# Patient Record
Sex: Female | Born: 1975 | Race: Black or African American | Hispanic: No | Marital: Single | Smoking: Never smoker
Health system: Southern US, Community
[De-identification: ages and names within clinical notes are randomized; demographics above are authoritative.]

## PROBLEM LIST (undated history)

## (undated) DIAGNOSIS — M797 Fibromyalgia: Secondary | ICD-10-CM

## (undated) DIAGNOSIS — I5021 Acute systolic (congestive) heart failure: Secondary | ICD-10-CM

## (undated) DIAGNOSIS — F329 Major depressive disorder, single episode, unspecified: Secondary | ICD-10-CM

## (undated) DIAGNOSIS — J349 Unspecified disorder of nose and nasal sinuses: Secondary | ICD-10-CM

## (undated) DIAGNOSIS — F32A Depression, unspecified: Secondary | ICD-10-CM

## (undated) DIAGNOSIS — J189 Pneumonia, unspecified organism: Secondary | ICD-10-CM

## (undated) DIAGNOSIS — I10 Essential (primary) hypertension: Secondary | ICD-10-CM

## (undated) DIAGNOSIS — F419 Anxiety disorder, unspecified: Secondary | ICD-10-CM

## (undated) DIAGNOSIS — J45909 Unspecified asthma, uncomplicated: Secondary | ICD-10-CM

## (undated) DIAGNOSIS — Z789 Other specified health status: Secondary | ICD-10-CM

## (undated) HISTORY — DX: Unspecified disorder of nose and nasal sinuses: J34.9

## (undated) HISTORY — DX: Pneumonia, unspecified organism: J18.9

## (undated) HISTORY — PX: NO PAST SURGERIES: SHX2092

---
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
## 1997-11-04 ENCOUNTER — Emergency Department (HOSPITAL_COMMUNITY): Admission: EM | Admit: 1997-11-04 | Discharge: 1997-11-04 | Payer: Self-pay | Admitting: Emergency Medicine

## 1998-05-07 ENCOUNTER — Encounter: Payer: Self-pay | Admitting: Pediatrics

## 1998-05-07 ENCOUNTER — Ambulatory Visit (HOSPITAL_COMMUNITY): Admission: RE | Admit: 1998-05-07 | Discharge: 1998-05-07 | Payer: Self-pay | Admitting: Pediatrics

## 1998-05-19 ENCOUNTER — Ambulatory Visit (HOSPITAL_COMMUNITY): Admission: RE | Admit: 1998-05-19 | Discharge: 1998-05-19 | Payer: Self-pay | Admitting: Pediatrics

## 2001-10-02 ENCOUNTER — Emergency Department (HOSPITAL_COMMUNITY): Admission: EM | Admit: 2001-10-02 | Discharge: 2001-10-03 | Payer: Self-pay | Admitting: Emergency Medicine

## 2002-10-18 ENCOUNTER — Other Ambulatory Visit: Admission: RE | Admit: 2002-10-18 | Discharge: 2002-10-18 | Payer: Self-pay | Admitting: Obstetrics and Gynecology

## 2007-09-11 ENCOUNTER — Ambulatory Visit (HOSPITAL_COMMUNITY): Admission: RE | Admit: 2007-09-11 | Discharge: 2007-09-11 | Payer: Self-pay | Admitting: Surgery

## 2007-09-13 ENCOUNTER — Encounter: Admission: RE | Admit: 2007-09-13 | Discharge: 2007-09-13 | Payer: Self-pay | Admitting: Surgery

## 2007-10-02 ENCOUNTER — Ambulatory Visit (HOSPITAL_COMMUNITY): Admission: RE | Admit: 2007-10-02 | Discharge: 2007-10-02 | Payer: Self-pay | Admitting: Surgery

## 2007-12-12 ENCOUNTER — Encounter: Admission: RE | Admit: 2007-12-12 | Discharge: 2007-12-12 | Payer: Self-pay | Admitting: Surgery

## 2007-12-26 ENCOUNTER — Ambulatory Visit (HOSPITAL_COMMUNITY): Admission: RE | Admit: 2007-12-26 | Discharge: 2007-12-28 | Payer: Self-pay | Admitting: Surgery

## 2007-12-26 HISTORY — PX: LAPAROSCOPIC GASTRIC BANDING: SHX1100

## 2007-12-27 ENCOUNTER — Encounter (INDEPENDENT_AMBULATORY_CARE_PROVIDER_SITE_OTHER): Payer: Self-pay | Admitting: Surgery

## 2007-12-27 ENCOUNTER — Ambulatory Visit: Payer: Self-pay | Admitting: Vascular Surgery

## 2008-01-09 ENCOUNTER — Encounter: Admission: RE | Admit: 2008-01-09 | Discharge: 2008-01-31 | Payer: Self-pay | Admitting: Surgery

## 2009-10-14 ENCOUNTER — Encounter: Admission: RE | Admit: 2009-10-14 | Discharge: 2009-10-14 | Payer: Self-pay | Admitting: Surgery

## 2010-08-11 NOTE — Op Note (Signed)
NAME:  Samantha Romero, Samantha Romero NO.:  [id number]   MEDICAL RECORD NO.:  [id number]          PATIENT TYPE:  OIB   LOCATION:  1227                         FACILITY:  [facility]   PHYSICIAN:  Thornton Park. Daphine Deutscher, MD  DATE OF BIRTH:  1975/08/08   DATE OF PROCEDURE:  12/26/2007  DATE OF DISCHARGE:                               OPERATIVE REPORT   PREOPERATIVE DIAGNOSIS:  Morbid obesity, body mass index of 54.   POSTOPERATIVE DIAGNOSIS:  Morbid obesity, body mass index of 54, no  sliding hiatal hernia noted with the balloon test.   OPERATIVE TIME:  3 hours 45 minutes.   PROCEDURE:  Laparoscopic adjustable gastric banding with an APL device  by Allergan with upper endoscopy.   SURGEON:  Luretha Murphy, M.D.   ASSISTANT:  Baruch Merl, M.D.   ANESTHESIA:  General endotracheal.   DESCRIPTION OF PROCEDURE:  Korin Setzler is a 35 year old African  American lady with morbid obesity, BMI of 54.  Informed consent was  obtained preop regarding the laparoscopic procedure as well as open  procedures, if indicated.  The patient was seen in the holding area and  no further questions were raised and we were to proceed with lap band.  The patient was taken to room 1 and given general anesthesia.  A time-  out was done.  The patient was prepped with Techni-Care and draped  sterilely.  Access to the abdomen was gained through the left upper  quadrant using OptiVu 0 degree scope and the significant finding was  that her pannus was quite thick and because of her short stature, is  very short in the mid section.  Upon entering and insufflating, it was  notable that she had a very small compartment to work through and  tremendous amount of adiposity.  I went ahead and placed a lower trocar  with a little bit of an angle for the angle scope and it did not even  reach into the abdomen.  Eventually I had to use 12 mm long trocars.  I  also had to have incorporate extra trocars because of the location  of  her left lateral segment and the smallness of her diaphragm and thorax.  The Nathanson retractor did not fit and I used AutoSuture blue cloth  wand coming in through a right lateral port.  With that and with two  insufflations, which I eventually added a second one, we still struggled  with being able to really see in the upper abdomen.  First we did pass a  balloon sizer, blew up the balloon and brought it back and it engaged  diaphragm and did not slide and with 15 mL, we felt that she likely did  not have a significant hiatal hernia.  I then I created a spot over on  the left side for subsequent dissection and ended up because of  trajectories having to use the upper port for the band passer.  I passed  that after bringing in the APL band through the 15 port.  Multiple times  we with some difficulty again because of the  trajectories and despite  bending some of our Prestige graspers passed the band into the band  passer and bring it around but it would pop out.  I also went ahead and  endoscoped her in the meantime to make sure everything was okay with the  dissection and then the esophagogastric area appeared to be undisturbed.  I was able to get the APL band around and snapped and then was able to  hold it over and then coming in very anteriorly and very high with an  Endo stitch, I sutured it with four sutures plicating the stomach  anterior.  The four ties were secured with a tie knot.  I irrigated  well.  I brought  this out through the right upper port and created a subcutaneous pocket,  connected it to this and then sutured it with four sutures to the  fascia.  The wounds were all irrigated, closed 4-0 Vicryl and with  Benzoin Steri-Strips.  The patient seemed to tolerate the procedure well  and was taken to the recovery room in satisfactory condition.      Thornton Park Daphine Deutscher, MD  Electronically Signed     MBM/MEDQ  D:  12/26/2007  T:  12/27/2007  Job:  [id number]   cc:    Clydie Braun L. Hal Hope, M.D.  Fax: [phone]

## 2010-10-23 ENCOUNTER — Encounter (INDEPENDENT_AMBULATORY_CARE_PROVIDER_SITE_OTHER): Payer: Self-pay | Admitting: Surgery

## 2010-10-23 ENCOUNTER — Ambulatory Visit (INDEPENDENT_AMBULATORY_CARE_PROVIDER_SITE_OTHER): Payer: BC Managed Care – PPO | Admitting: Surgery

## 2010-10-23 VITALS — BP 148/100 | Wt 256.8 lb

## 2010-10-23 DIAGNOSIS — Z4651 Encounter for fitting and adjustment of gastric lap band: Secondary | ICD-10-CM

## 2010-10-23 DIAGNOSIS — Z9884 Bariatric surgery status: Secondary | ICD-10-CM

## 2010-10-23 NOTE — Progress Notes (Signed)
Ms. Laplante comes in today for a LAP-BAND felt. I accessed her port and added at 0.3 cc to her band.  Recently she had a flare up of her asthma and one on steroids and this altered her weight loss. Today's weight is 256.8. She lost a total of 38.2 overall suture surgery. I went ahead and added 0.3 cc today see her back in 6 weeks

## 2010-12-07 ENCOUNTER — Encounter (INDEPENDENT_AMBULATORY_CARE_PROVIDER_SITE_OTHER): Payer: Self-pay | Admitting: Surgery

## 2010-12-09 ENCOUNTER — Encounter (INDEPENDENT_AMBULATORY_CARE_PROVIDER_SITE_OTHER): Payer: Self-pay | Admitting: Surgery

## 2010-12-28 LAB — COMPREHENSIVE METABOLIC PANEL
AST: 21
Albumin: 3.9
Chloride: 107
Creatinine, Ser: 0.68
GFR calc Af Amer: >60
Potassium: 3.3 — ABNORMAL LOW
Total Bilirubin: 0.7
Total Protein: 6.5

## 2010-12-28 LAB — CBC
MCV: 85.9
MCV: 86.1
Platelets: 215
Platelets: 221
RBC: 3.8 — ABNORMAL LOW
RDW: 13.7
WBC: 5.2
WBC: 8.4

## 2010-12-28 LAB — PREGNANCY, URINE: Preg Test, Ur: NEGATIVE

## 2010-12-28 LAB — DIFFERENTIAL
Lymphocytes Relative: 11 — ABNORMAL LOW
Lymphs Abs: 0.9
Monocytes Relative: 9
Neutro Abs: 6.7
Neutrophils Relative %: 80 — ABNORMAL HIGH

## 2010-12-28 LAB — HEMOGLOBIN AND HEMATOCRIT, BLOOD
HCT: 37.4
Hemoglobin: 12.2

## 2011-02-24 ENCOUNTER — Encounter (INDEPENDENT_AMBULATORY_CARE_PROVIDER_SITE_OTHER): Payer: Self-pay | Admitting: Surgery

## 2011-02-24 ENCOUNTER — Ambulatory Visit (INDEPENDENT_AMBULATORY_CARE_PROVIDER_SITE_OTHER): Payer: BC Managed Care – PPO | Admitting: Surgery

## 2011-02-24 ENCOUNTER — Encounter (INDEPENDENT_AMBULATORY_CARE_PROVIDER_SITE_OTHER): Payer: BC Managed Care – PPO

## 2011-02-24 VITALS — BP 158/102 | HR 100 | Temp 98.5°F | Resp 18 | Ht 61.0 in | Wt 271.0 lb

## 2011-02-24 DIAGNOSIS — Z9884 Bariatric surgery status: Secondary | ICD-10-CM

## 2011-02-24 NOTE — Patient Instructions (Signed)

## 2011-02-24 NOTE — Progress Notes (Signed)
Deonne comes in today and he really says she feels no restriction.  On it and added 3 cc to her APL band. She was able to drink and she wanted to try this. She did feel it was somewhat tighter. I told her that if it's too tight when she comes off here in the morning of 11 tobacco in the office and I would do an significant fluid out of her pain if it if it is too tight. Otherwise she felt like it was fine and she was able to  drink water.

## 2011-07-13 ENCOUNTER — Encounter (INDEPENDENT_AMBULATORY_CARE_PROVIDER_SITE_OTHER): Payer: Self-pay | Admitting: Surgery

## 2011-07-13 DIAGNOSIS — J349 Unspecified disorder of nose and nasal sinuses: Secondary | ICD-10-CM | POA: Insufficient documentation

## 2011-07-21 ENCOUNTER — Ambulatory Visit (INDEPENDENT_AMBULATORY_CARE_PROVIDER_SITE_OTHER): Payer: BC Managed Care – PPO | Admitting: Surgery

## 2011-07-21 ENCOUNTER — Encounter (INDEPENDENT_AMBULATORY_CARE_PROVIDER_SITE_OTHER): Payer: Self-pay | Admitting: Surgery

## 2011-07-21 VITALS — BP 120/98 | Ht 62.0 in | Wt 271.6 lb

## 2011-07-21 DIAGNOSIS — Z9884 Bariatric surgery status: Secondary | ICD-10-CM | POA: Insufficient documentation

## 2011-07-21 NOTE — Progress Notes (Signed)
Samantha Romero comes in today with her inability to really be constricted. The last visit I gave 3 cc which he could tell o up higher but she felt a didn't last long. It sounds like she may well have a week. I attempted to aspirate her fluid to see him much was in her band but had difficulty getting in the report. I can see how this might lead to an aberrant stick.  I'm going to give her chart to Okey Regal and see if we get her proved for laparoscopy, check her band, and change her LAP-BAND port. Based on that we'll move forward with scheduling her surgery.

## 2011-08-11 ENCOUNTER — Telehealth (INDEPENDENT_AMBULATORY_CARE_PROVIDER_SITE_OTHER): Payer: Self-pay

## 2011-08-11 NOTE — Telephone Encounter (Signed)
They need orders put in for surgery on 5/28

## 2011-08-12 ENCOUNTER — Encounter (HOSPITAL_COMMUNITY): Payer: Self-pay | Admitting: Pharmacy Technician

## 2011-08-13 ENCOUNTER — Other Ambulatory Visit: Payer: Self-pay | Admitting: Family Medicine

## 2011-08-14 ENCOUNTER — Other Ambulatory Visit (INDEPENDENT_AMBULATORY_CARE_PROVIDER_SITE_OTHER): Payer: Self-pay | Admitting: Surgery

## 2011-08-17 ENCOUNTER — Other Ambulatory Visit: Payer: Self-pay | Admitting: Family Medicine

## 2011-08-17 ENCOUNTER — Encounter (HOSPITAL_COMMUNITY)
Admission: RE | Admit: 2011-08-17 | Discharge: 2011-08-17 | Disposition: A | Discharge status: Home / Self Care | Payer: BC Managed Care – PPO | Source: Ambulatory Visit | Attending: Surgery | Admitting: Surgery

## 2011-08-17 ENCOUNTER — Encounter (HOSPITAL_COMMUNITY): Payer: Self-pay

## 2011-08-17 ENCOUNTER — Ambulatory Visit (HOSPITAL_COMMUNITY)
Admission: RE | Admit: 2011-08-17 | Discharge: 2011-08-17 | Disposition: A | Discharge status: Home / Self Care | Payer: BC Managed Care – PPO | Source: Ambulatory Visit | Attending: Surgery | Admitting: Surgery

## 2011-08-17 DIAGNOSIS — Z01818 Encounter for other preprocedural examination: Secondary | ICD-10-CM | POA: Insufficient documentation

## 2011-08-17 DIAGNOSIS — Z01812 Encounter for preprocedural laboratory examination: Secondary | ICD-10-CM | POA: Insufficient documentation

## 2011-08-17 DIAGNOSIS — Z01810 Encounter for preprocedural cardiovascular examination: Secondary | ICD-10-CM | POA: Insufficient documentation

## 2011-08-17 LAB — CBC
HCT: 39.1 % (ref 36.0–46.0)
MCH: 28.6 pg (ref 26.0–34.0)
MCV: 85.9 fL (ref 78.0–100.0)
Platelets: 252 10*3/uL (ref 150–400)
RDW: 12.6 % (ref 11.5–15.5)

## 2011-08-17 NOTE — Patient Instructions (Addendum)
20 Samantha Romero  08/17/2011   Your procedure is scheduled on:  08-24-2011  Report to [facility] 1120 am  Call this number if you have problems the morning of surgery: [phone]   Remember:fleets enema night before surgery   Do not eat food:After Midnight. Clear liquids midnight until 0749 am, then nothing by mouth  .Take these medicines the morning of surgery with A SIP OF WATER: albuterol inhaler if needed and bring inhaler, certizine, advair   Do not wear jewelry or make up.  Do not wear lotions, powders, or perfumes.Do not wear deodorant.    Do not bring valuables to the hospital.  Contacts, dentures or bridgework may not be worn into surgery.  Leave suitcase in the car. After surgery it may be brought to your room.  For patients admitted to the hospital, checkout time is 11:00 AM the day of discharge.     Special Instructions: CHG Shower Use Special Wash: 1/2 bottle night before surgery and 1/2 bottle morning of surgery.neck down avoid private area, no shaving for 2 days before showers   Please read over the following fact sheets that you were given: MRSA Information  Cain S[facility] pre op nurse phone number [phone], call if needed

## 2011-08-24 ENCOUNTER — Ambulatory Visit (HOSPITAL_COMMUNITY)
Admission: RE | Admit: 2011-08-24 | Discharge: 2011-08-24 | Disposition: A | Discharge status: Home / Self Care | Payer: BC Managed Care – PPO | Source: Ambulatory Visit | Attending: Surgery | Admitting: Surgery

## 2011-08-24 ENCOUNTER — Ambulatory Visit (HOSPITAL_COMMUNITY): Payer: BC Managed Care – PPO | Admitting: Anesthesiology

## 2011-08-24 ENCOUNTER — Encounter (HOSPITAL_COMMUNITY): Payer: Self-pay | Admitting: Anesthesiology

## 2011-08-24 ENCOUNTER — Encounter (HOSPITAL_COMMUNITY): Payer: Self-pay | Admitting: *Deleted

## 2011-08-24 ENCOUNTER — Encounter (HOSPITAL_COMMUNITY)
Admission: RE | Disposition: A | Discharge status: Home / Self Care | Payer: Self-pay | Source: Ambulatory Visit | Attending: Surgery

## 2011-08-24 DIAGNOSIS — Y831 Surgical operation with implant of artificial internal device as the cause of abnormal reaction of the patient, or of later complication, without mention of misadventure at the time of the procedure: Secondary | ICD-10-CM | POA: Insufficient documentation

## 2011-08-24 DIAGNOSIS — K9509 Other complications of gastric band procedure: Secondary | ICD-10-CM | POA: Insufficient documentation

## 2011-08-24 DIAGNOSIS — T85898A Other specified complication of other internal prosthetic devices, implants and grafts, initial encounter: Secondary | ICD-10-CM

## 2011-08-24 HISTORY — PX: GASTRIC BANDING PORT REVISION: SHX5246

## 2011-08-24 SURGERY — REVISION, GASTRIC BAND, LAPAROSCOPIC
Anesthesia: General | Site: Abdomen | Wound class: Clean

## 2011-08-24 MED ORDER — OXYCODONE HCL 5 MG PO TABS
ORAL_TABLET | ORAL | Status: AC
  Administered 2011-08-24: 5 mg via ORAL
  Filled 2011-08-24: qty 1

## 2011-08-24 MED ORDER — OXYCODONE-ACETAMINOPHEN 5-325 MG/5ML PO SOLN: 5.0000 mL | ORAL | Status: DC | PRN

## 2011-08-24 MED ORDER — LIDOCAINE HCL (CARDIAC) 20 MG/ML IV SOLN
INTRAVENOUS | Status: DC | PRN
  Administered 2011-08-24: 60 mg via INTRAVENOUS

## 2011-08-24 MED ORDER — FENTANYL CITRATE 0.05 MG/ML IJ SOLN
INTRAMUSCULAR | Status: DC | PRN
  Administered 2011-08-24: 50 ug via INTRAVENOUS
  Administered 2011-08-24 (×3): 100 ug via INTRAVENOUS

## 2011-08-24 MED ORDER — HEPARIN SODIUM (PORCINE) 5000 UNIT/ML IJ SOLN
INTRAMUSCULAR | Status: AC
  Filled 2011-08-24: qty 1

## 2011-08-24 MED ORDER — BUPIVACAINE-EPINEPHRINE 0.25% -1:200000 IJ SOLN
INTRAMUSCULAR | Status: DC | PRN
  Administered 2011-08-24: 10 mL

## 2011-08-24 MED ORDER — ONDANSETRON HCL 4 MG/2ML IJ SOLN
INTRAMUSCULAR | Status: DC | PRN
  Administered 2011-08-24: 4 mg via INTRAVENOUS

## 2011-08-24 MED ORDER — DEXAMETHASONE SODIUM PHOSPHATE 10 MG/ML IJ SOLN
INTRAMUSCULAR | Status: DC | PRN
  Administered 2011-08-24: 10 mg via INTRAVENOUS

## 2011-08-24 MED ORDER — LACTATED RINGERS IR SOLN
Status: DC | PRN
  Administered 2011-08-24: 3000 mL

## 2011-08-24 MED ORDER — PROPOFOL 10 MG/ML IV BOLUS
INTRAVENOUS | Status: DC | PRN
  Administered 2011-08-24: 200 mg via INTRAVENOUS

## 2011-08-24 MED ORDER — HEPARIN SODIUM (PORCINE) 5000 UNIT/ML IJ SOLN
5000.0000 [IU] | Freq: Once | INTRAMUSCULAR | Status: AC
  Administered 2011-08-24: 5000 [IU] via SUBCUTANEOUS

## 2011-08-24 MED ORDER — DEXTROSE 5 % IV SOLN
2.0000 g | INTRAVENOUS | Status: AC
  Administered 2011-08-24: 2 g via INTRAVENOUS
  Filled 2011-08-24: qty 2

## 2011-08-24 MED ORDER — LACTATED RINGERS IV SOLN
INTRAVENOUS | Status: DC | PRN
  Administered 2011-08-24 (×2): via INTRAVENOUS

## 2011-08-24 MED ORDER — SUCCINYLCHOLINE CHLORIDE 20 MG/ML IJ SOLN
INTRAMUSCULAR | Status: DC | PRN
  Administered 2011-08-24: 100 mg via INTRAVENOUS

## 2011-08-24 MED ORDER — PROMETHAZINE HCL 25 MG/ML IJ SOLN: 6.2500 mg | INTRAMUSCULAR | Status: DC | PRN

## 2011-08-24 MED ORDER — GLYCOPYRROLATE 0.2 MG/ML IJ SOLN
INTRAMUSCULAR | Status: DC | PRN
  Administered 2011-08-24: .8 mg via INTRAVENOUS

## 2011-08-24 MED ORDER — BUPIVACAINE-EPINEPHRINE PF 0.25-1:200000 % IJ SOLN
INTRAMUSCULAR | Status: AC
  Filled 2011-08-24: qty 30

## 2011-08-24 MED ORDER — ROCURONIUM BROMIDE 100 MG/10ML IV SOLN
INTRAVENOUS | Status: DC | PRN
  Administered 2011-08-24: 35 mg via INTRAVENOUS
  Administered 2011-08-24: 15 mg via INTRAVENOUS

## 2011-08-24 MED ORDER — METHYLENE BLUE 1 % INJ SOLN
INTRAMUSCULAR | Status: AC
  Filled 2011-08-24: qty 10

## 2011-08-24 MED ORDER — OXYCODONE HCL 5 MG PO TABS
5.0000 mg | ORAL_TABLET | ORAL | Status: DC | PRN
  Administered 2011-08-24: 5 mg via ORAL

## 2011-08-24 MED ORDER — NEOSTIGMINE METHYLSULFATE 1 MG/ML IJ SOLN
INTRAMUSCULAR | Status: DC | PRN
  Administered 2011-08-24: 5 mg via INTRAVENOUS

## 2011-08-24 MED ORDER — MEPERIDINE HCL 50 MG/ML IJ SOLN: 6.2500 mg | INTRAMUSCULAR | Status: DC | PRN

## 2011-08-24 MED ORDER — BUPIVACAINE-EPINEPHRINE 0.25% -1:200000 IJ SOLN
INTRAMUSCULAR | Status: AC
  Filled 2011-08-24: qty 1

## 2011-08-24 MED ORDER — CHLORHEXIDINE GLUCONATE 4 % EX LIQD
1.0000 "application " | Freq: Once | CUTANEOUS | Status: DC
  Filled 2011-08-24: qty 15

## 2011-08-24 MED ORDER — MIDAZOLAM HCL 5 MG/5ML IJ SOLN
INTRAMUSCULAR | Status: DC | PRN
  Administered 2011-08-24: 2 mg via INTRAVENOUS

## 2011-08-24 MED ORDER — SODIUM CHLORIDE 0.9 % IJ SOLN
INTRAMUSCULAR | Status: DC | PRN
  Administered 2011-08-24: 50 mL via INTRAVENOUS

## 2011-08-24 MED ORDER — LACTATED RINGERS IV SOLN: INTRAVENOUS | Status: DC

## 2011-08-24 MED ORDER — BUPIVACAINE LIPOSOME 1.3 % IJ SUSP
20.0000 mL | INTRAMUSCULAR | Status: DC
  Filled 2011-08-24: qty 20

## 2011-08-24 MED ORDER — HYDROMORPHONE HCL PF 1 MG/ML IJ SOLN: 0.2500 mg | INTRAMUSCULAR | Status: DC | PRN

## 2011-08-24 SURGICAL SUPPLY — 38 items
ACC PORT GSTRC BAND STD KT HI (Band) ×2 IMPLANT
ADH SKN CLS APL DERMABOND .7 (GAUZE/BANDAGES/DRESSINGS) ×2
APL SKNCLS STERI-STRIP NONHPOA (GAUZE/BANDAGES/DRESSINGS)
BENZOIN TINCTURE PRP APPL 2/3 (GAUZE/BANDAGES/DRESSINGS) IMPLANT
CANISTER SUCTION 2500CC (MISCELLANEOUS) ×3 IMPLANT
CLOTH BEACON ORANGE TIMEOUT ST (SAFETY) ×3 IMPLANT
COVER SURGICAL LIGHT HANDLE (MISCELLANEOUS) ×3 IMPLANT
DECANTER SPIKE VIAL GLASS SM (MISCELLANEOUS) ×2 IMPLANT
DERMABOND ADVANCED (GAUZE/BANDAGES/DRESSINGS) ×1
DERMABOND ADVANCED .7 DNX12 (GAUZE/BANDAGES/DRESSINGS) ×1 IMPLANT
DRAPE LAPAROSCOPIC ABDOMINAL (DRAPES) ×3 IMPLANT
ELECT REM PT RETURN 9FT ADLT (ELECTROSURGICAL) ×3
ELECTRODE REM PT RTRN 9FT ADLT (ELECTROSURGICAL) ×2 IMPLANT
GLOVE BIOGEL M 8.0 STRL (GLOVE) ×3 IMPLANT
GLOVE BIOGEL PI IND STRL 7.0 (GLOVE) ×2 IMPLANT
GLOVE BIOGEL PI INDICATOR 7.0 (GLOVE) ×1
GOWN STRL NON-REIN LRG LVL3 (GOWN DISPOSABLE) ×3 IMPLANT
GOWN STRL REIN XL XLG (GOWN DISPOSABLE) ×6 IMPLANT
HAND ACTIVATED (MISCELLANEOUS) ×2 IMPLANT
KIT ACCESS PORT VG (Band) ×2 IMPLANT
KIT BASIN OR (CUSTOM PROCEDURE TRAY) ×3 IMPLANT
MESH HERNIA 1X4 RECT BARD (Mesh General) ×1 IMPLANT
MESH HERNIA BARD 1X4 (Mesh General) ×1 IMPLANT
PENCIL BUTTON HOLSTER BLD 10FT (ELECTRODE) ×2 IMPLANT
SET IRRIG TUBING LAPAROSCOPIC (IRRIGATION / IRRIGATOR) ×2 IMPLANT
SLEEVE Z-THREAD 5X100MM (TROCAR) IMPLANT
SOLUTION ANTI FOG 6CC (MISCELLANEOUS) ×3 IMPLANT
STRIP CLOSURE SKIN 1/2X4 (GAUZE/BANDAGES/DRESSINGS) ×2 IMPLANT
SUT PROLENE 2 0 CT2 30 (SUTURE) ×4 IMPLANT
SUT VIC AB 4-0 SH 18 (SUTURE) ×2 IMPLANT
SYR 30ML LL (SYRINGE) ×3 IMPLANT
TRAY FOLEY CATH 14FRSI W/METER (CATHETERS) ×3 IMPLANT
TRAY LAP CHOLE (CUSTOM PROCEDURE TRAY) ×3 IMPLANT
TROCAR HASSON GELL 12X100 (TROCAR) IMPLANT
TROCAR Z-THREAD FIOS 11X100 BL (TROCAR) ×2 IMPLANT
TROCAR Z-THREAD FIOS 5X100MM (TROCAR) ×2 IMPLANT
TROCAR Z-THREAD SLEEVE 11X100 (TROCAR) ×2 IMPLANT
TUBING INSUFFLATION 10FT LAP (TUBING) ×3 IMPLANT

## 2011-08-24 NOTE — H&P (Signed)
Chief Complaint:  Inability to maintain proper constriction  History of Present Illness:  Samantha Romero is an 36 y.o. female who had an APL band and has been frustrated with restriction.  There may be leaking from the band or port and we plan to look and revise as necessary.    Past Medical History  Diagnosis Date  . Sinus problem   . Asthma   . Pneumonia 2012  . Generalized headaches     Past Surgical History  Procedure Date  . Laparoscopic gastric banding 12/26/2007    Current Facility-Administered Medications  Medication Dose Route Frequency Provider Last Rate Last Dose  . cefOXitin (MEFOXIN) 2 g in dextrose 5 % 50 mL IVPB  2 g Intravenous 60 min Pre-Op Valarie Merino, MD      . chlorhexidine (HIBICLENS) 4 % liquid 1 application  1 application Topical Once Valarie Merino, MD      . heparin 5000 UNIT/ML injection           . heparin injection 5,000 Units  5,000 Units Subcutaneous Once Valarie Merino, MD   5,000 Units at 08/24/11 1134   Shellfish allergy Family History  Problem Relation Age of Onset  . Cancer Maternal Uncle     lung  . Cancer Maternal Grandfather     lung and back   Social History:   reports that she has never smoked. She has never used smokeless tobacco. She reports that she drinks alcohol. She reports that she does not use illicit drugs.   REVIEW OF SYSTEMS - PERTINENT POSITIVES ONLY: noncontributory  Physical Exam:   Blood pressure 158/112, pulse 91, temperature 98.6 F (37 C), resp. rate 20, SpO2 97.00%. There is no height or weight on file to calculate BMI.  Gen:  WDWN AAF NAD  Neurological: Alert and oriented to person, place, and time. Motor and sensory function is grossly intact  Head: Normocephalic and atraumatic.  Eyes: Conjunctivae are normal. Pupils are equal, round, and reactive to light. No scleral icterus.  Neck: Normal range of motion. Neck supple. No tracheal deviation or thyromegaly present.  Cardiovascular:  SR without murmurs  or gallops.  No carotid bruits Respiratory: Effort normal.  No respiratory distress. No chest wall tenderness. Breath sounds normal.  No wheezes, rales or rhonchi.  Abdomen:  Port in the right upper quadrant GU: Musculoskeletal: Normal range of motion. Extremities are nontender. No cyanosis, edema or clubbing noted Lymphadenopathy: No cervical, preauricular, postauricular or axillary adenopathy is present Skin: Skin is warm and dry. No rash noted. No diaphoresis. No erythema. No pallor. Pscyh: Normal mood and affect. Behavior is normal. Judgment and thought content normal.   LABORATORY RESULTS: No results found for this or any previous visit (from the past 48 hour(s)).  RADIOLOGY RESULTS: No results found.  Problem List: Patient Active Problem List  Diagnoses  . Sinus problem  . 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    Assessment & Plan: Lapband, inadequate restriction.  Plan revision    Matt B. Daphine Deutscher, MD, [facility] [phone] beeper [phone]  08/24/2011 12:32 PM

## 2011-08-24 NOTE — Discharge Instructions (Signed)
You have 6 cc of fluid in your band.  You may feel some restriction with this 1. Stay on liquids for the next 2 days as you adapt to your new fill volume.  Then resume your previous diet. 2. Decreasing your carbohydrate intake will hasten you weight loss.  Rely more on proteins for your meals.  Avoid condiments that contain sweets such as Honey Mustard and sugary salad dressings.   3. Stay in the "green zone".  If you are regurgitating with meals, having night time reflux, and find yourself eating soft comfort foods (mashed potatoes, potato chips)...realize that you are developing "maladaptive eating".  You will not lose weight this way and may regain weight.  The GREEN ZONE is eating smaller portions and not regurgitating.  Hence we may need to withdraw fluid from your band. 4. Build exercise into your daily routine.  Walking is the best way to start but do something every day if you can.

## 2011-08-24 NOTE — Transfer of Care (Signed)
Immediate Anesthesia Transfer of Care Note  Patient: Samantha Romero  Procedure(s) Performed: Procedure(s) (LRB): LAPAROSCOPIC REVISION OF GASTRIC BAND (N/A)  Patient Location: PACU  Anesthesia Type: General  Level of Consciousness: awake, alert , oriented and patient cooperative  Airway & Oxygen Therapy: Patient Spontanous Breathing and Patient connected to face mask oxygen  Post-op Assessment: Report given to PACU RN, Post -op Vital signs reviewed and stable and Patient moving all extremities  Post vital signs: Reviewed and stable  Complications: No apparent anesthesia complications

## 2011-08-24 NOTE — Op Note (Signed)
Surgeon: Wenda Low, MD, FACS  Asst:  Jaclynn Guarneri M.D. FACS  Anes:  General  Procedure: Port site revision from prior lap band  Diagnosis: Leaking port tubing  Complications: None  EBL:   1 cc  Description of Procedure:  Patient was taken to room 1 on Tuesday, may the 28th and given general anesthesia. The abdomen was prepped with PCMX and draped sterilely. Previous port which is in the right upper quadrant was first exposed by excising the old scar and carrying this down and I carefully removed it from the fascia. After doing that I brought it up into the wound and then with a Huber needle injected it with 5 cc of saline. Thereupon there was an immediate leak spraying a stream of saline up in the air coming from the tubing just down from the port. This was in the reinforced area. It was obvious that this was the site where the pressure was allowed to only hold about 3 cc of fluid. I then removed the old port and installed a new port with mesh on the back and implanted it subcutaneously. The new port is lying about a centimeter inferior and lateral to the lateral edge of this right upper quadrant port site. He was placed in subcutaneous pocket and then the depths of the wound was closed with 4-0 Vicryl and then 4-0 Vicryl subcuticularly with Dermabond skin. Patient our the procedure well was taken recovery room in satisfactory condition. I left her with 6 cc of fluid in her band.  Matt B. Daphine Deutscher, MD, [facility], [place] [phone]

## 2011-08-24 NOTE — Anesthesia Preprocedure Evaluation (Signed)
Anesthesia Evaluation  Patient identified by MRN, date of birth, ID band Patient awake    Reviewed: Allergy & Precautions, H&P , NPO status , Patient's Chart, lab work & pertinent test results  Airway Mallampati: I TM Distance: >3 FB Neck ROM: Full    Dental No notable dental hx.    Pulmonary neg pulmonary ROS, asthma ,  breath sounds clear to auscultation  Pulmonary exam normal       Cardiovascular negative cardio ROS  Rhythm:Regular Rate:Normal     Neuro/Psych negative neurological ROS  negative psych ROS   GI/Hepatic negative GI ROS, Neg liver ROS,   Endo/Other  negative endocrine ROSMorbid obesity  Renal/GU negative Renal ROS  negative genitourinary   Musculoskeletal negative musculoskeletal ROS (+)   Abdominal   Peds negative pediatric ROS (+)  Hematology negative hematology ROS (+)   Anesthesia Other Findings   Reproductive/Obstetrics negative OB ROS                           Anesthesia Physical Anesthesia Plan  ASA: III  Anesthesia Plan: General   Post-op Pain Management:    Induction: Intravenous  Airway Management Planned: Oral ETT  Additional Equipment:   Intra-op Plan:   Post-operative Plan: Extubation in OR  Informed Consent: I have reviewed the patients History and Physical, chart, labs and discussed the procedure including the risks, benefits and alternatives for the proposed anesthesia with the patient or authorized representative who has indicated his/her understanding and acceptance.   Dental advisory given  Plan Discussed with: CRNA  Anesthesia Plan Comments:         Anesthesia Quick Evaluation

## 2011-08-24 NOTE — Anesthesia Postprocedure Evaluation (Signed)
  Anesthesia Post-op Note  Patient: Samantha Romero  Procedure(s) Performed: Procedure(s) (LRB): LAPAROSCOPIC REVISION OF GASTRIC BAND (N/A)  Patient Location: PACU  Anesthesia Type: General  Level of Consciousness: awake and alert   Airway and Oxygen Therapy: Patient Spontanous Breathing  Post-op Pain: mild  Post-op Assessment: Post-op Vital signs reviewed, Patient's Cardiovascular Status Stable, Respiratory Function Stable, Patent Airway and No signs of Nausea or vomiting  Post-op Vital Signs: stable  Complications: No apparent anesthesia complications

## 2011-08-24 NOTE — Preoperative (Signed)
Beta Blockers   Reason not to administer Beta Blockers:Not Applicable 

## 2011-09-10 ENCOUNTER — Encounter (INDEPENDENT_AMBULATORY_CARE_PROVIDER_SITE_OTHER): Payer: Self-pay | Admitting: Surgery

## 2011-09-10 ENCOUNTER — Ambulatory Visit (INDEPENDENT_AMBULATORY_CARE_PROVIDER_SITE_OTHER): Payer: BC Managed Care – PPO | Admitting: Surgery

## 2011-09-10 VITALS — BP 118/86 | Ht 62.0 in | Wt 267.6 lb

## 2011-09-10 DIAGNOSIS — K9509 Other complications of gastric band procedure: Secondary | ICD-10-CM | POA: Insufficient documentation

## 2011-09-10 NOTE — Patient Instructions (Addendum)
See me in 6 weeks

## 2011-09-10 NOTE — Progress Notes (Signed)
Samantha Romero 36 y.o.  Body mass index is 48.94 kg/(m^2).  Patient Active Problem List  Diagnosis  . Sinus problem  . 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    Allergies  Allergen Reactions  . Shellfish Allergy Itching    In throat only.    Past Surgical History  Procedure Date  . Laparoscopic gastric banding 12/26/2007  . Gastric banding port revision 08/24/11   No primary provider on file. No diagnosis found.  [name] is finally feeling restriction.  She reports that she is doing well. Will see her back in the office in 6 weeks to consider fill.   Matt B. Daphine Deutscher, MD, [facility] [phone] beeper [phone]  09/10/2011 2:37 PM

## 2011-09-27 ENCOUNTER — Telehealth: Payer: Self-pay

## 2011-09-27 ENCOUNTER — Ambulatory Visit (INDEPENDENT_AMBULATORY_CARE_PROVIDER_SITE_OTHER): Payer: BC Managed Care – PPO | Admitting: Internal Medicine

## 2011-09-27 ENCOUNTER — Ambulatory Visit: Payer: BC Managed Care – PPO

## 2011-09-27 VITALS — BP 138/94 | HR 117 | Temp 99.1°F | Resp 18 | Ht 62.0 in | Wt 274.0 lb

## 2011-09-27 DIAGNOSIS — J40 Bronchitis, not specified as acute or chronic: Secondary | ICD-10-CM

## 2011-09-27 DIAGNOSIS — R062 Wheezing: Secondary | ICD-10-CM

## 2011-09-27 DIAGNOSIS — R0602 Shortness of breath: Secondary | ICD-10-CM

## 2011-09-27 DIAGNOSIS — R059 Cough, unspecified: Secondary | ICD-10-CM

## 2011-09-27 DIAGNOSIS — J45901 Unspecified asthma with (acute) exacerbation: Secondary | ICD-10-CM

## 2011-09-27 DIAGNOSIS — R042 Hemoptysis: Secondary | ICD-10-CM

## 2011-09-27 DIAGNOSIS — J189 Pneumonia, unspecified organism: Secondary | ICD-10-CM

## 2011-09-27 DIAGNOSIS — R05 Cough: Secondary | ICD-10-CM

## 2011-09-27 LAB — POCT CBC
Granulocyte percent: 79.5 %G (ref 37–80)
Lymph, poc: 2.5 (ref 0.6–3.4)
MCHC: 31.2 g/dL — AB (ref 31.8–35.4)
MPV: 9.8 fL (ref 0–99.8)
POC Granulocyte: 12.2 — AB (ref 2–6.9)
POC LYMPH PERCENT: 16.2 %L (ref 10–50)
POC MID %: 4.3 %M (ref 0–12)
RDW, POC: 13 %

## 2011-09-27 MED ORDER — PREDNISONE 50 MG PO TABS: ORAL_TABLET | ORAL | Status: DC

## 2011-09-27 MED ORDER — IPRATROPIUM BROMIDE 0.02 % IN SOLN
0.5000 mg | Freq: Once | RESPIRATORY_TRACT | Status: AC
  Administered 2011-09-27: 0.5 mg via RESPIRATORY_TRACT

## 2011-09-27 MED ORDER — METHYLPREDNISOLONE SODIUM SUCC 125 MG IJ SOLR
125.0000 mg | Freq: Once | INTRAMUSCULAR | Status: AC
  Administered 2011-09-27: 125 mg via INTRAMUSCULAR

## 2011-09-27 MED ORDER — AZITHROMYCIN 500 MG PO TABS: 500.0000 mg | ORAL_TABLET | Freq: Every day | ORAL | Status: DC

## 2011-09-27 MED ORDER — CEFTRIAXONE SODIUM 1 G IJ SOLR
1.0000 g | Freq: Once | INTRAMUSCULAR | Status: AC
  Administered 2011-09-27: 1 g via INTRAMUSCULAR

## 2011-09-27 MED ORDER — GUAIFENESIN-CODEINE 100-10 MG/5ML PO SOLN: ORAL | Status: AC

## 2011-09-27 MED ORDER — ALBUTEROL SULFATE (2.5 MG/3ML) 0.083% IN NEBU
2.5000 mg | INHALATION_SOLUTION | Freq: Once | RESPIRATORY_TRACT | Status: AC
  Administered 2011-09-27: 2.5 mg via RESPIRATORY_TRACT

## 2011-09-27 NOTE — Telephone Encounter (Signed)
Done and printed

## 2011-09-27 NOTE — Telephone Encounter (Signed)
Pt was in and was diagnosed with pnumonia she would like to know if she could have something for her cough as well [phone]

## 2011-09-27 NOTE — Progress Notes (Signed)
  Subjective:    Patient ID: Samantha Romero, female    DOB: 11-Mar-1976, 36 y.o.   MRN: [id number]  HPI wheezing Sob using nebulized albuteral every 4 hours yesterday without relief; moderate in severity;worse with exertion Cough Yellow sputum with blood streaks Onset 4 days ago Chills no known fever nonsmoker   Review of Systems  Constitutional: Positive for chills, activity change, appetite change and fatigue.  HENT: Negative.   Eyes: Negative.   Respiratory: Positive for cough, shortness of breath and wheezing.   Cardiovascular: Negative.   Gastrointestinal: Negative.   Genitourinary: Negative.   Musculoskeletal: Negative.   Skin: Negative.   Neurological: Negative.   Hematological: Negative.   Psychiatric/Behavioral: Negative.   All other systems reviewed and are negative.       Objective:   Physical Exam  Nursing note and vitals reviewed. Constitutional: She is oriented to person, place, and time. She appears well-developed and well-nourished.  HENT:  Head: Normocephalic and atraumatic.  Right Ear: External ear normal.  Left Ear: External ear normal.  Eyes: Conjunctivae and EOM are normal. Pupils are equal, round, and reactive to light.  Neck: Normal range of motion. Neck supple.  Cardiovascular: Normal rate, regular rhythm and normal heart sounds.   Pulmonary/Chest: No respiratory distress. She has wheezes. She exhibits tenderness.  Abdominal: Soft. Bowel sounds are normal.  Musculoskeletal: Normal range of motion.  Neurological: She is alert and oriented to person, place, and time. She has normal reflexes.  Skin: Skin is warm and dry.  Psychiatric: She has a normal mood and affect. Her behavior is normal. Judgment and thought content normal.   Results for orders placed in visit on 09/27/11  POCT CBC      Component Value Range   WBC 15.4 (*) 4.6 - 10.2 K/uL   Lymph, poc 2.5  0.6 - 3.4   POC LYMPH PERCENT 16.2  10 - 50 %L   MID (cbc) 0.7  0 - 0.9   POC MID %  4.3  0 - 12 %M   POC Granulocyte 12.2 (*) 2 - 6.9   Granulocyte percent 79.5  37 - 80 %G   RBC 4.56  4.04 - 5.48 M/uL   Hemoglobin 12.7  12.2 - 16.2 g/dL   HCT, POC 11.9  14.7 - 47.9 %   MCV 89.3  80 - 97 fL   MCH, POC 27.9  27 - 31.2 pg   MCHC 31.2 (*) 31.8 - 35.4 g/dL   RDW, POC 82.9     Platelet Count, POC 252  142 - 424 K/uL   MPV 9.8  0 - 99.8 fL    [facility] reading (PRIMARY) by  Dr. Mindi Junker pneumonia right lower lobe . Wbc elevated     Assessment & Plan:  Pneumonia with exacc of asthma and bronchospasm. Will rx with nebulizedd bronchodilators in the office . im solumedrol. im rocephin. Out pt zithromaxz prednisone and bronchodilators.

## 2011-09-27 NOTE — Telephone Encounter (Signed)
Faxed to pharmacy

## 2011-09-27 NOTE — Patient Instructions (Addendum)
Continue to use your medications and inhalers. In addition prednisone 1 tab daily in the am with food, complete all the medication. zithromax 1 tab daily start today and complete. If your symptoms worsen return to the office.

## 2011-09-30 ENCOUNTER — Telehealth: Payer: Self-pay

## 2011-09-30 DIAGNOSIS — J40 Bronchitis, not specified as acute or chronic: Secondary | ICD-10-CM

## 2011-09-30 MED ORDER — AZITHROMYCIN 500 MG PO TABS: 500.0000 mg | ORAL_TABLET | Freq: Every day | ORAL | Status: AC

## 2011-09-30 MED ORDER — PREDNISONE 50 MG PO TABS: ORAL_TABLET | ORAL | Status: AC

## 2011-09-30 NOTE — Telephone Encounter (Signed)
Patient is packing to go out of town and lost her medication from Monday 09/27/11, prednisone and z-pack  prescirbed by Dr Mindi Junker.  She is getting on a plane later today and wants to know if we can prescribe 2 days of these meds.  Use [facility] or [facility].  Please call to inform.  [phone].

## 2011-09-30 NOTE — Telephone Encounter (Signed)
Rx sent to pharmacy only sent to [facility] in error. Can we call and transfer? Thanks!

## 2011-09-30 NOTE — Telephone Encounter (Signed)
[facility] [address] ok-- patient notified.

## 2011-10-27 ENCOUNTER — Encounter (INDEPENDENT_AMBULATORY_CARE_PROVIDER_SITE_OTHER): Payer: BC Managed Care – PPO | Admitting: Surgery

## 2011-11-26 ENCOUNTER — Encounter (INDEPENDENT_AMBULATORY_CARE_PROVIDER_SITE_OTHER): Payer: BC Managed Care – PPO | Admitting: Surgery

## 2011-12-09 ENCOUNTER — Encounter (INDEPENDENT_AMBULATORY_CARE_PROVIDER_SITE_OTHER): Payer: BC Managed Care – PPO | Admitting: Surgery

## 2011-12-17 ENCOUNTER — Encounter (INDEPENDENT_AMBULATORY_CARE_PROVIDER_SITE_OTHER): Payer: Self-pay | Admitting: Surgery

## 2012-02-14 ENCOUNTER — Ambulatory Visit (INDEPENDENT_AMBULATORY_CARE_PROVIDER_SITE_OTHER): Payer: BC Managed Care – PPO | Admitting: Surgery

## 2012-02-14 ENCOUNTER — Encounter (INDEPENDENT_AMBULATORY_CARE_PROVIDER_SITE_OTHER): Payer: Self-pay | Admitting: Surgery

## 2012-02-14 VITALS — BP 134/96 | HR 125 | Temp 97.7°F | Ht 62.0 in | Wt 285.0 lb

## 2012-02-14 DIAGNOSIS — K9509 Other complications of gastric band procedure: Secondary | ICD-10-CM

## 2012-02-14 NOTE — Progress Notes (Signed)
Samantha Romero Body mass index is 52.13 kg/(m^2).  Having regurgitation:  no  Nocturnal reflux?  no  Amount of fill  1   Samantha Romero has been frustrated.  She's had some other medical issues and has been eating comfort foods. Her weight is 285 today. She has not had a fill  did her band port revision. The port itself was along the inferior and lateral. I had axis it from below since I think is tilted lobe and down.  I accessed it and filter with 1 cc. She tolerated this well and drank okay. I will see her back in 2 weeks to begin try to get her in the green zone. I will keep her a postop until I think she is in the green zone since she didn't get to see me during her real postoperative period

## 2012-02-14 NOTE — Patient Instructions (Signed)

## 2012-02-29 ENCOUNTER — Encounter (HOSPITAL_COMMUNITY): Payer: Self-pay | Admitting: *Deleted

## 2012-02-29 ENCOUNTER — Ambulatory Visit (HOSPITAL_COMMUNITY)
Admission: RE | Admit: 2012-02-29 | Discharge: 2012-02-29 | Disposition: A | Discharge status: Home / Self Care | Payer: BC Managed Care – PPO | Attending: Psychiatry | Admitting: Psychiatry

## 2012-02-29 DIAGNOSIS — F3289 Other specified depressive episodes: Secondary | ICD-10-CM | POA: Insufficient documentation

## 2012-02-29 DIAGNOSIS — F411 Generalized anxiety disorder: Secondary | ICD-10-CM | POA: Insufficient documentation

## 2012-02-29 DIAGNOSIS — F329 Major depressive disorder, single episode, unspecified: Secondary | ICD-10-CM | POA: Insufficient documentation

## 2012-02-29 HISTORY — DX: Other specified health status: Z78.9

## 2012-02-29 NOTE — BH Assessment (Addendum)
Assessment Note   Samantha Romero is an 36 y.o. female. Pt was a walk in to the [facility]. She came in with a friend.  During the assessment the pt denied any si/hi/av. She had complaints of mild to moderate depression with some anxiety. She stated her stressors are her job, family issues and loss. She has never had any previous suicide attempts and never had any inpatient treatment. She stated approximately 3 years ago she saw a therapist for about 3 months. She denied taking any psychotropic medications presently and in the past. Her past medical hx was she had a lap band for weight reduction. Pt was given referral information sheet and she will f/u accordingly. She left the facility.        Axis I: Anxiety Disorder NOS and Depressive Disorder NOS Axis II: Deferred Axis III:  Past Medical History  Diagnosis Date  . Sinus problem   . No pertinent past medical history    Axis IV: occupational problems and other psychosocial or environmental problems Axis V: 51-60 moderate symptoms  Past Medical History:  Past Medical History  Diagnosis Date  . Sinus problem   . No pertinent past medical history     Past Surgical History  Procedure Date  . Laparoscopic gastric banding 12/26/2007  . Gastric banding port revision 08/24/11  . No past surgeries     Family History:  Family History  Problem Relation Age of Onset  . Cancer Maternal Uncle     lung  . Cancer Maternal Grandfather     lung and back    Social History:  reports that she has never smoked. She has never used smokeless tobacco. She reports that she drinks alcohol. She reports that she does not use illicit drugs.  Additional Social History:  Alcohol / Drug Use Pain Medications: none  Prescriptions: pt stated "none" Over the Counter: none  History of alcohol / drug use?:  (drinks socially and no hx of drug use/abuse ) Negative Consequences of Use:  (none )  CIWA:   COWS:    Allergies:  Allergies  Allergen Reactions  .  Shellfish Allergy Itching    In throat only.    Home Medications:  (Not in a hospital admission)  OB/GYN Status:  No LMP recorded.  General Assessment Data Location of Assessment: [facility] ACT Assessment:  (not applicable ) Living Arrangements: Alone Can pt return to current living arrangement?: Yes Admission Status: Voluntary Is patient capable of signing voluntary admission?: Yes Transfer from: Other (Comment) (walk in ) Referral Source: Self/Family/Friend  Education Status Is patient currently in school?: No Current Grade: not applicable  Highest grade of school patient has completed: 16 yrs  Name of school:  [employer]) Contact person:  (not applicable )  Risk to self Suicidal Ideation: No Suicidal Intent: No Is patient at risk for suicide?: No Suicidal Plan?: No Access to Means: No What has been your use of drugs/alcohol within the last 12 months?: socially  Previous Attempts/Gestures: No How many times?: 0  Other Self Harm Risks:  (none noted ) Triggers for Past Attempts: Other (Comment) (not applicable ) Intentional Self Injurious Behavior: None Family Suicide History: No Recent stressful life event(s):  (job, relationships and loss issues ) Persecutory voices/beliefs?: No Depression: Yes Depression Symptoms:  (sleep problems, tearfuolness, fatigue and loss of interest ) Substance abuse history and/or treatment for substance abuse?: No Suicide prevention information given to non-admitted patients: Not applicable  Risk to Others Homicidal Ideation:  No Thoughts of Harm to Others: No Current Homicidal Intent: No Current Homicidal Plan: No Access to Homicidal Means: No Identified Victim: not applicable  History of harm to others?: No Assessment of Violence: None Noted Violent Behavior Description: not applicable  Does patient have access to weapons?: No Criminal Charges Pending?: No Does patient have a court date:  No  Psychosis Hallucinations: None noted Delusions: None noted  Mental Status Report Appear/Hygiene: Other (Comment) (wdl) Eye Contact: Good Motor Activity: Unremarkable Speech: Other (Comment) (wdl) Level of Consciousness: Alert Mood: Anxious Affect: Other (Comment) (wdl) Anxiety Level: Minimal Thought Processes: Coherent;Relevant Judgement: Other (Comment) (wdl) Orientation: Person;Place;Time;Situation Obsessive Compulsive Thoughts/Behaviors: None  Cognitive Functioning Concentration: Normal Memory: Recent Intact;Remote Intact IQ: Average Insight: Good Impulse Control: Good Appetite: Fair Weight Loss: 0  Weight Gain: 50  Sleep: Decreased Total Hours of Sleep: 4  Vegetative Symptoms: None  ADLScreening [facility] Assessment Services) Patient's cognitive ability adequate to safely complete daily activities?: Yes Patient able to express need for assistance with ADLs?: Yes Independently performs ADLs?: Yes (appropriate for developmental age)  Abuse/Neglect [facility]) Physical Abuse: Denies Verbal Abuse: Denies Sexual Abuse: Denies  Prior Inpatient Therapy Prior Inpatient Therapy: No Prior Therapy Dates: not applicable  Prior Therapy Facilty/Provider(s): not applicable  Reason for Treatment: not applicable   Prior Outpatient Therapy Prior Outpatient Therapy: Yes Prior Therapy Dates:  (unknown dates; pt stated 3 yrs ago ) Prior Therapy Facilty/Provider(s):  (pt could not recall name of the therapist ) Reason for Treatment:  (mild depression )  ADL Screening (condition at time of admission) Patient's cognitive ability adequate to safely complete daily activities?: Yes Patient able to express need for assistance with ADLs?: Yes Independently performs ADLs?: Yes (appropriate for developmental age) Weakness of Legs: None Weakness of Arms/Hands: None  Home Assistive Devices/Equipment Home Assistive Devices/Equipment: None  Therapy Consults (therapy consults require a  physician order) PT Evaluation Needed: No OT Evalulation Needed: No SLP Evaluation Needed: No Abuse/Neglect Assessment (Assessment to be complete while patient is alone) Physical Abuse: Denies Verbal Abuse: Denies Sexual Abuse: Denies Exploitation of patient/patient's resources: Denies Self-Neglect: Denies Possible abuse reported to::  (none noted ) Values / Beliefs Cultural Requests During Hospitalization: None Spiritual Requests During Hospitalization: None Consults Spiritual Care Consult Needed: No Social Work Consult Needed: No Merchant navy officer (For Healthcare) Advance Directive: Patient does not have advance directive Pre-existing out of facility DNR order (yellow form or pink MOST form): No Nutrition Screen- [facility] Adult/WL/AP Patient's home diet: Regular Have you recently lost weight without trying?: No (pt stated she has gained weight (50 lbs)) Have you been eating poorly because of a decreased appetite?: No Malnutrition Screening Tool Score: 0   Additional Information 1:1 In Past 12 Months?: No CIRT Risk: No Elopement Risk: No Does patient have medical clearance?: No (mse done by kimberly,pa)     Disposition: after pt assessment, then had her mse with kimberly pa; pt was given information for f/u with providers in the area. She also had a therapist in the past, she couldn't remember the name of the therapist , but could use this as a f/u resource once she investigated who the party was.   Disposition Disposition of Patient: Other dispositions Other disposition(s): Information only  On Site Evaluation by:   Reviewed with Physician:     Valente David 02/29/2012 2:47 PM

## 2012-02-29 NOTE — H&P (Signed)
Behavioral Health Medical Screening Exam  Samantha Romero is an 36 y.o. female.  Review of Systems  Constitutional: Positive for weight loss and malaise/fatigue. Negative for fever and diaphoresis.  HENT: Negative for ear pain, congestion, sore throat, tinnitus and ear discharge.   Eyes: Negative for blurred vision, photophobia, pain, discharge and redness.  Respiratory: Negative for cough, shortness of breath and wheezing.   Cardiovascular: Positive for leg swelling. Negative for chest pain and palpitations.  Gastrointestinal: Negative for heartburn, nausea, abdominal pain and diarrhea.  Genitourinary: Negative for frequency and flank pain.  Musculoskeletal: Negative for myalgias and joint pain.  Skin: Negative for rash. Itching: dry skin.  Neurological: Negative for dizziness, tremors, focal weakness, seizures, loss of consciousness, weakness and headaches.  Psychiatric/Behavioral: Positive for depression. Negative for suicidal ideas, hallucinations and substance abuse. The patient is nervous/anxious and has insomnia.     Physical Exam  Constitutional: She is oriented to person, place, and time.  HENT:  Head: Normocephalic and atraumatic.  Right Ear: External ear normal.  Left Ear: External ear normal.  Mouth/Throat: Oropharynx is clear and moist.  Neck: Normal range of motion. Neck supple. No tracheal deviation (obese) present.  Cardiovascular: Normal rate, regular rhythm and normal heart sounds.   Respiratory: Effort normal and breath sounds normal.  GI: Soft. She exhibits no distension. There is no tenderness. There is no rebound and no guarding.  Musculoskeletal: Normal range of motion. She exhibits no edema.  Lymphadenopathy:    She has no cervical adenopathy.  Neurological: She is alert and oriented to person, place, and time.  Skin: Skin is warm, dry and intact.  Psychiatric:       +anxiety, + insomnia, + racing thoughts         There were no vitals taken for this  visit.  Recommendations:  Based on my evaluation the patient does not appear to have an emergency medical condition.  Norval Gable FNP-BC 02/29/2012, 2:48 PM

## 2012-03-01 ENCOUNTER — Ambulatory Visit (INDEPENDENT_AMBULATORY_CARE_PROVIDER_SITE_OTHER): Payer: BC Managed Care – PPO | Admitting: Surgery

## 2012-03-01 ENCOUNTER — Encounter (INDEPENDENT_AMBULATORY_CARE_PROVIDER_SITE_OTHER): Payer: Self-pay | Admitting: Surgery

## 2012-03-01 VITALS — BP 142/90 | HR 76 | Temp 98.2°F | Resp 16 | Ht 62.0 in | Wt 273.4 lb

## 2012-03-01 DIAGNOSIS — F329 Major depressive disorder, single episode, unspecified: Secondary | ICD-10-CM

## 2012-03-01 DIAGNOSIS — F32A Depression, unspecified: Secondary | ICD-10-CM

## 2012-03-01 MED ORDER — ALPRAZOLAM 0.25 MG PO TABS: 0.2500 mg | ORAL_TABLET | Freq: Every evening | ORAL | Status: DC | PRN

## 2012-03-01 NOTE — Patient Instructions (Signed)
Thanks for your patience.  If you need further assistance after leaving the office, please call our office and speak with a [facility] nurse.  [phone].  If you want to leave a message for Dr. Daphine Deutscher, please call his office phone at [phone].  Dr. Daphine Deutscher cell:  [phone]

## 2012-03-01 NOTE — H&P (Signed)
Agree with the screening

## 2012-03-01 NOTE — Progress Notes (Signed)
Samantha Romero 36 y.o.  Body mass index is 50.01 kg/(m^2).  Patient Active Problem List  Diagnosis  . Sinus problem  . 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 Lapband port leaking-REVISED May 2013  . Asthma attack    Allergies  Allergen Reactions  . Shellfish Allergy Itching    In throat only.    Past Surgical History  Procedure Date  . Laparoscopic gastric banding 12/26/2007  . Gastric banding port revision 08/24/11  . No past surgeries    Romero,JESSICA, MD No diagnosis found.  Samantha Romero comes in this afternoon after her lap band fell about 3 weeks ago. She has lost about 12 pounds. However she's having some significant depression issues. We spent about 45 minutes listening to her issues which are twofold. She's had a significant breakup with her brother who has some severe psychiatric problems including schizophrenia and he is preventing her from seeing his 67-year-old son who she has raised for many months in the past. This has hurt her and she feels this loss. In addition she had a good relationship: With a female friend and this has recently resulted in a breakup because he felt somewhat overwhelmed by her issues. As a result she has had difficulty sleeping. She said her with skin. 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 Holidays made this much more stressful for her. She has reached out to behavioral health and is tried to get appointments with multiple providers and people were unable to see her.  Offered to admit her as an inpatient if necessary or cold give her some medication to help her sleep (Xanax 0.25 mg at bedtime for sleep). We talked about different strategies for coping with stress and refocusing. She is certainly tried a lot of these and is frustrated that not being able to turn things around. I gave her my cell phone if she feels overwhelmed and I will likely admit her.    Routine appt made for 3 weeks.  Matt B. Daphine Deutscher, MD,  [facility] [phone] beeper [phone]  03/01/2012 6:22 PM

## 2012-03-03 ENCOUNTER — Other Ambulatory Visit (INDEPENDENT_AMBULATORY_CARE_PROVIDER_SITE_OTHER): Payer: Self-pay | Admitting: Surgery

## 2012-03-03 MED ORDER — SERTRALINE HCL 50 MG PO TABS: ORAL_TABLET | ORAL | Status: DC

## 2012-03-03 NOTE — Progress Notes (Signed)
Samantha Romero checked in with me today.  Still with some difficulty sleeping and anxiety.  Offered to see or admit.  Wants to try something more to help her rest.   Will increase Xanax to .5 mg HS and start Zoloft 25 mg daily for 8 days and then 50 mg a day.

## 2012-03-07 ENCOUNTER — Encounter (INDEPENDENT_AMBULATORY_CARE_PROVIDER_SITE_OTHER): Payer: Self-pay | Admitting: General Surgery

## 2012-03-23 ENCOUNTER — Encounter (INDEPENDENT_AMBULATORY_CARE_PROVIDER_SITE_OTHER): Payer: BC Managed Care – PPO | Admitting: Surgery

## 2012-04-04 ENCOUNTER — Other Ambulatory Visit (INDEPENDENT_AMBULATORY_CARE_PROVIDER_SITE_OTHER): Payer: Self-pay | Admitting: Surgery

## 2012-04-04 MED ORDER — ALPRAZOLAM 0.25 MG PO TABS: 0.2500 mg | ORAL_TABLET | Freq: Every evening | ORAL | Status: DC | PRN

## 2012-04-05 ENCOUNTER — Telehealth (INDEPENDENT_AMBULATORY_CARE_PROVIDER_SITE_OTHER): Payer: Self-pay

## 2012-04-05 ENCOUNTER — Telehealth (INDEPENDENT_AMBULATORY_CARE_PROVIDER_SITE_OTHER): Payer: Self-pay | Admitting: General Surgery

## 2012-04-05 NOTE — Telephone Encounter (Signed)
Patient called back today she was returning your call please call back on her cell when you come in

## 2012-04-05 NOTE — Telephone Encounter (Signed)
Called pt to schedule her next LBF appt w/ MM (Fri 01/10 @ 210p) and ask how she has been doing.  She said she has been doing fine, but that the Rx never went through to her pharmacy.  She asked if I could call it in to a different pharmacy.  I called her Rx for Xanax and Zoloft into the [facility] on [address].

## 2012-04-07 ENCOUNTER — Encounter (INDEPENDENT_AMBULATORY_CARE_PROVIDER_SITE_OTHER): Payer: Self-pay | Admitting: Surgery

## 2012-04-07 ENCOUNTER — Ambulatory Visit (INDEPENDENT_AMBULATORY_CARE_PROVIDER_SITE_OTHER): Payer: BC Managed Care – PPO | Admitting: Surgery

## 2012-04-07 ENCOUNTER — Ambulatory Visit
Admission: RE | Admit: 2012-04-07 | Discharge: 2012-04-07 | Disposition: A | Discharge status: Home / Self Care | Payer: BC Managed Care – PPO | Source: Ambulatory Visit | Attending: Surgery | Admitting: Surgery

## 2012-04-07 VITALS — BP 146/84 | HR 72 | Temp 97.3°F | Resp 16 | Ht 62.0 in | Wt 271.8 lb

## 2012-04-07 DIAGNOSIS — Z9884 Bariatric surgery status: Secondary | ICD-10-CM

## 2012-04-07 NOTE — Progress Notes (Signed)
Samantha Romero 37 y.o.  Body mass index is 49.71 kg/(m^2).  Patient Active Problem List  Diagnosis  . Sinus problem  . 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 Lapband port leaking-REVISED May 2013  . Asthma attack  . Depression, acute    Allergies  Allergen Reactions  . Shellfish Allergy Itching    In throat only.    Past Surgical History  Procedure Date  . Laparoscopic gastric banding 12/26/2007  . Gastric banding port revision 08/24/11  . No past surgeries    COPLAND,JESSICA, MD 1. History of laparoscopic adjustable gastric banding     Attempted to access and fill band but was unable to hit port.  Will send over to [facility] for flat plate.   Matt B. Daphine Deutscher, MD, [facility] [phone] beeper [phone]  04/07/2012 3:17 PM  On x-ray it looks like her port has flipped. I was able to access this by grasping it inferiorly and rotating it anteriorly and then coming in an angle acutely from below and access. I added 0.25 cc to her band. I'll see her back in 4 weeks or a little more to assess refill.

## 2012-05-11 ENCOUNTER — Encounter (INDEPENDENT_AMBULATORY_CARE_PROVIDER_SITE_OTHER): Payer: BC Managed Care – PPO | Admitting: Surgery

## 2012-05-12 ENCOUNTER — Telehealth (INDEPENDENT_AMBULATORY_CARE_PROVIDER_SITE_OTHER): Payer: Self-pay

## 2012-05-12 NOTE — Telephone Encounter (Signed)
LMOM for pt letting her know her new appt date and time has been set for [facility], March 3rd @ 310p

## 2012-05-26 ENCOUNTER — Telehealth (INDEPENDENT_AMBULATORY_CARE_PROVIDER_SITE_OTHER): Payer: Self-pay

## 2012-05-26 NOTE — Telephone Encounter (Signed)
Spoke with pt to see if she could r/s her appt time on 3/3 from 3p to 115p.  Pt stated that 115p is "perfect timing" for her.  I r/s'd

## 2012-05-29 ENCOUNTER — Encounter (INDEPENDENT_AMBULATORY_CARE_PROVIDER_SITE_OTHER): Payer: Self-pay | Admitting: Surgery

## 2012-05-29 ENCOUNTER — Ambulatory Visit (INDEPENDENT_AMBULATORY_CARE_PROVIDER_SITE_OTHER): Payer: BC Managed Care – PPO | Admitting: Surgery

## 2012-05-29 ENCOUNTER — Encounter (INDEPENDENT_AMBULATORY_CARE_PROVIDER_SITE_OTHER): Payer: BC Managed Care – PPO | Admitting: Surgery

## 2012-05-29 VITALS — BP 152/84 | HR 84 | Temp 97.4°F | Resp 16 | Ht 62.0 in | Wt 277.0 lb

## 2012-05-29 DIAGNOSIS — Z9884 Bariatric surgery status: Secondary | ICD-10-CM

## 2012-05-29 NOTE — Progress Notes (Signed)
Lapband Fill Encounter Problem List:   Patient Active Problem List  Diagnosis  . Sinus problem  . 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 Lapband port leaking-REVISED May 2013  . Asthma attack  . Depression, acute    Sidnee C Bealer Body mass index is 50.65 kg/(m^2). Weight loss since surgery  18  Having regurgitation?:  No  Feel that they need a fill?  Yes  Nocturnal reflux?  No has had a bout of asthma  Amount of fill  1 cc   Port site: Lives tilted and I have to grasp it and rotated up and entered the port from inferiorly. I was able to stick on the first try to. Added 1 cc to band. She was able to drink without issue. Return in 6 weeks  Instructions given and weight loss goals discussed.    Matt B. Daphine Deutscher, MD, FACS

## 2012-05-29 NOTE — Patient Instructions (Addendum)

## 2012-05-31 ENCOUNTER — Telehealth (INDEPENDENT_AMBULATORY_CARE_PROVIDER_SITE_OTHER): Payer: Self-pay

## 2012-05-31 NOTE — Telephone Encounter (Signed)
LMOM for pt letting her know that I have scheduled her to come back for a LBF on Thurs, March 24 @ 11a.  I also stated that with her work schedule she could come later than her appt time and that MM and I will work her in if necessary.

## 2012-07-20 ENCOUNTER — Encounter (INDEPENDENT_AMBULATORY_CARE_PROVIDER_SITE_OTHER): Payer: BC Managed Care – PPO | Admitting: Surgery

## 2012-07-20 ENCOUNTER — Telehealth (INDEPENDENT_AMBULATORY_CARE_PROVIDER_SITE_OTHER): Payer: Self-pay

## 2012-07-20 NOTE — Telephone Encounter (Signed)
Spoke with pt about rescheduling her appt.  I made it for ay 21 @ 4p.  Pt agreed that this date and time work for her.

## 2012-08-16 ENCOUNTER — Ambulatory Visit (INDEPENDENT_AMBULATORY_CARE_PROVIDER_SITE_OTHER): Payer: BC Managed Care – PPO | Admitting: Surgery

## 2012-08-16 ENCOUNTER — Encounter (INDEPENDENT_AMBULATORY_CARE_PROVIDER_SITE_OTHER): Payer: Self-pay | Admitting: Surgery

## 2012-08-16 DIAGNOSIS — K9509 Other complications of gastric band procedure: Secondary | ICD-10-CM

## 2012-08-16 NOTE — Progress Notes (Signed)
Lapband Fill Encounter Problem List:   Patient Active Problem List   Diagnosis Date Noted  . Depression, acute 03/01/2012  . Asthma attack 09/27/2011  . Lapband port leaking-REVISED May 2013 09/10/2011  . 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 Sinus problem     Samantha Romero There is no weight on file to calculate BMI. Weight loss since surgery  16   Having regurgitation?:  no  Feel that they need a fill?  yes  Nocturnal reflux?  no  Amount of fill  0.2     Instructions given and weight loss goals discussed.    Discussed her life stressors including sleep disturbances.  Added fluid.  Will see back in 2 months.   Improved.   Matt B. Daphine Deutscher, MD, FACS

## 2012-08-16 NOTE — Patient Instructions (Signed)

## 2012-09-04 ENCOUNTER — Telehealth (INDEPENDENT_AMBULATORY_CARE_PROVIDER_SITE_OTHER): Payer: Self-pay

## 2012-09-04 NOTE — Telephone Encounter (Signed)
Called in Xanax 1mg  #30 with 1 refill to [facility] on [address] per MM.

## 2012-09-28 ENCOUNTER — Encounter (INDEPENDENT_AMBULATORY_CARE_PROVIDER_SITE_OTHER): Payer: BC Managed Care – PPO | Admitting: Surgery

## 2012-10-09 ENCOUNTER — Encounter (INDEPENDENT_AMBULATORY_CARE_PROVIDER_SITE_OTHER): Payer: Self-pay | Admitting: Surgery

## 2012-10-11 ENCOUNTER — Encounter (INDEPENDENT_AMBULATORY_CARE_PROVIDER_SITE_OTHER): Payer: BC Managed Care – PPO | Admitting: Surgery

## 2012-11-01 ENCOUNTER — Encounter (INDEPENDENT_AMBULATORY_CARE_PROVIDER_SITE_OTHER): Payer: Self-pay | Admitting: Surgery

## 2012-11-01 ENCOUNTER — Ambulatory Visit (INDEPENDENT_AMBULATORY_CARE_PROVIDER_SITE_OTHER): Payer: BC Managed Care – PPO | Admitting: Surgery

## 2012-11-01 VITALS — BP 180/110 | HR 100 | Temp 98.7°F | Resp 16 | Ht 62.0 in | Wt 271.8 lb

## 2012-11-01 DIAGNOSIS — Z9884 Bariatric surgery status: Secondary | ICD-10-CM

## 2012-11-01 DIAGNOSIS — Z4651 Encounter for fitting and adjustment of gastric lap band: Secondary | ICD-10-CM

## 2012-11-01 MED ORDER — ALPRAZOLAM 0.5 MG PO TABS: 0.5000 mg | ORAL_TABLET | Freq: Every evening | ORAL | Status: DC | PRN

## 2012-11-01 NOTE — Patient Instructions (Signed)

## 2012-11-01 NOTE — Progress Notes (Signed)
Lapband Fill Encounter Problem List:   Patient Active Problem List   Diagnosis Date Noted  . Depression, acute 03/01/2012  . Asthma attack 09/27/2011  . Lapband port leaking-REVISED May 2013 09/10/2011  . 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 Sinus problem     Denijah C Mcclarty Body mass index is 49.7 kg/(m^2). Weight loss since surgery  23 lbs  Having regurgitation?:  no  Feel that they need a fill?  yes  Nocturnal reflux?  no  Amount of fill  0.5     Instructions given and weight loss goals discussed.    About to go on a 12 week sabatical.  Able to drink after fill.  Will see back in 2 months.     Matt B. Daphine Deutscher, MD, FACS

## 2013-01-08 ENCOUNTER — Ambulatory Visit (INDEPENDENT_AMBULATORY_CARE_PROVIDER_SITE_OTHER): Payer: Self-pay | Admitting: Surgery

## 2013-01-08 ENCOUNTER — Encounter (INDEPENDENT_AMBULATORY_CARE_PROVIDER_SITE_OTHER): Payer: Self-pay | Admitting: Surgery

## 2013-01-08 VITALS — BP 160/102 | HR 82 | Temp 99.5°F | Resp 16 | Ht 62.0 in | Wt 268.2 lb

## 2013-01-08 DIAGNOSIS — F32A Depression, unspecified: Secondary | ICD-10-CM

## 2013-01-08 DIAGNOSIS — F329 Major depressive disorder, single episode, unspecified: Secondary | ICD-10-CM

## 2013-01-08 NOTE — Patient Instructions (Signed)

## 2013-01-08 NOTE — Progress Notes (Signed)
Keyanah [place]ock 37 y.o.  Body mass index is 49.04 kg/(m^2).  Patient Active Problem List   Diagnosis Date Noted  . Depression, acute 03/01/2012  . Asthma attack 09/27/2011  . Lapband port leaking-REVISED May 2013 09/10/2011  . 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 Sinus problem     Allergies  Allergen Reactions  . Shellfish Allergy Itching    In throat only.    Past Surgical History  Procedure Laterality Date  . Laparoscopic gastric banding  12/26/2007  . Gastric banding port revision  08/24/11  . No past surgeries     Laurell Josephs, MD No diagnosis found.  Seen to discuss current coping with anxiety and leave of absence from work.  She is making progress on coping skills and is resting better.  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  Hopefully with her medication and therapy that she will be able to go back full time by early December 2014.  FMLA papers were completed.  Matt B. Daphine Deutscher, MD, [facility] [phone] beeper [phone]  01/08/2013 4:23 PM

## 2013-02-16 ENCOUNTER — Encounter (INDEPENDENT_AMBULATORY_CARE_PROVIDER_SITE_OTHER): Payer: BC Managed Care – PPO | Admitting: Surgery

## 2013-02-19 ENCOUNTER — Encounter (INDEPENDENT_AMBULATORY_CARE_PROVIDER_SITE_OTHER): Payer: Self-pay | Admitting: Surgery

## 2013-08-27 ENCOUNTER — Ambulatory Visit: Payer: BC Managed Care – PPO

## 2013-08-28 ENCOUNTER — Ambulatory Visit: Payer: BC Managed Care – PPO | Attending: Family Medicine

## 2013-08-28 DIAGNOSIS — IMO0001 Reserved for inherently not codable concepts without codable children: Secondary | ICD-10-CM | POA: Insufficient documentation

## 2013-08-28 DIAGNOSIS — M545 Low back pain, unspecified: Secondary | ICD-10-CM | POA: Insufficient documentation

## 2013-08-28 DIAGNOSIS — R5381 Other malaise: Secondary | ICD-10-CM | POA: Insufficient documentation

## 2013-08-30 ENCOUNTER — Ambulatory Visit: Payer: BC Managed Care – PPO

## 2013-09-04 ENCOUNTER — Ambulatory Visit: Payer: BC Managed Care – PPO | Admitting: Physical Therapy

## 2013-09-06 ENCOUNTER — Ambulatory Visit: Payer: BC Managed Care – PPO

## 2013-09-10 ENCOUNTER — Other Ambulatory Visit: Payer: Self-pay | Admitting: Family Medicine

## 2013-09-10 DIAGNOSIS — M545 Low back pain, unspecified: Secondary | ICD-10-CM

## 2013-09-11 ENCOUNTER — Ambulatory Visit: Payer: BC Managed Care – PPO | Admitting: Physical Therapy

## 2013-09-13 ENCOUNTER — Ambulatory Visit: Payer: BC Managed Care – PPO

## 2013-09-15 ENCOUNTER — Ambulatory Visit
Admission: RE | Admit: 2013-09-15 | Discharge: 2013-09-15 | Disposition: A | Discharge status: Home / Self Care | Payer: BC Managed Care – PPO | Source: Ambulatory Visit | Attending: Family Medicine | Admitting: Family Medicine

## 2013-09-15 DIAGNOSIS — M545 Low back pain, unspecified: Secondary | ICD-10-CM

## 2013-09-18 ENCOUNTER — Ambulatory Visit: Payer: BC Managed Care – PPO

## 2013-09-20 ENCOUNTER — Ambulatory Visit: Payer: BC Managed Care – PPO

## 2013-09-25 ENCOUNTER — Ambulatory Visit: Payer: BC Managed Care – PPO | Admitting: Physical Therapy

## 2013-09-27 ENCOUNTER — Ambulatory Visit: Payer: BC Managed Care – PPO | Attending: Family Medicine | Admitting: Physical Therapy

## 2013-09-27 DIAGNOSIS — M545 Low back pain, unspecified: Secondary | ICD-10-CM | POA: Insufficient documentation

## 2013-09-27 DIAGNOSIS — IMO0001 Reserved for inherently not codable concepts without codable children: Secondary | ICD-10-CM | POA: Insufficient documentation

## 2013-09-27 DIAGNOSIS — R5381 Other malaise: Secondary | ICD-10-CM | POA: Insufficient documentation

## 2014-06-13 ENCOUNTER — Other Ambulatory Visit (INDEPENDENT_AMBULATORY_CARE_PROVIDER_SITE_OTHER): Payer: Self-pay

## 2014-06-13 DIAGNOSIS — K2289 Other specified disease of esophagus: Secondary | ICD-10-CM

## 2014-06-13 DIAGNOSIS — K228 Other specified diseases of esophagus: Secondary | ICD-10-CM

## 2014-06-20 ENCOUNTER — Other Ambulatory Visit: Payer: Self-pay | Admitting: Surgery

## 2014-06-20 DIAGNOSIS — R1310 Dysphagia, unspecified: Secondary | ICD-10-CM

## 2014-06-28 ENCOUNTER — Ambulatory Visit (HOSPITAL_COMMUNITY)
Admission: RE | Admit: 2014-06-28 | Discharge: 2014-06-28 | Disposition: A | Discharge status: Home / Self Care | Payer: BLUE CROSS/BLUE SHIELD | Source: Ambulatory Visit | Attending: Surgery | Admitting: Surgery

## 2014-06-28 DIAGNOSIS — Z9884 Bariatric surgery status: Secondary | ICD-10-CM | POA: Insufficient documentation

## 2014-06-28 DIAGNOSIS — R079 Chest pain, unspecified: Secondary | ICD-10-CM | POA: Insufficient documentation

## 2014-10-17 ENCOUNTER — Other Ambulatory Visit: Payer: Self-pay | Admitting: Surgery

## 2014-11-25 ENCOUNTER — Other Ambulatory Visit: Payer: Self-pay | Admitting: Surgery

## 2014-11-25 NOTE — H&P (Addendum)
Samantha Romero 07/17/2014 2:08 PM Location: [facility] Patient #: [id number] DOB: [birth year] Single / Language: [place] / Race: Black or African American Female  History of Present Illness:  Her PCP is Rosey Bath, [place] [facility].  The patient is a 39 year old female is here for LapBand followup. LapBand surgery was performed on - Date: (12/26/2007). She had the lap band port revised in May 2013.  She has failed to lose adequate weight and has had problems with lapband restriction producing reflux induced asthma. Her current weight is up to 277 lbs. I reviewed her recent UGI and I am concerned that she has an erosion and will schedule her for endoscopy with Dr. Ezzard Standing. She made need lapband removal and conversion to sleeve.   She had an UGI on 06/28/2014 - Persistent luminal narrowing at the gastroesophageal junction at the location of the gastric band which may indicate extrinsic mass effect and could contribute to the patient's symptoms.  She was last seen by Dr. Daphine Deutscher on 07/17/2014.   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 No children  Medication History Maryan Puls, [place]; 07/17/2014 2:11 PM) Medications Reconciled  Vitals Maryan Puls MA; 07/17/2014 2:09 PM) 07/17/2014 2:09 PM  Weight: 277.6 lb Height: 62in  Body Surface Area: 2.35 m Body Mass Index: 50.77 kg/m Temp.: 98.65F(Temporal)  Pulse: 72 (Regular)  Resp.: 18 (Unlabored)  BP: 146/84 (Sitting, Left Arm, Standard)  Physical Exam (Matthew B. Daphine Deutscher MD; 07/17/2014 3:06 PM) General - WN Obese AA F Lungs - Clear Heart - RRR Abdomen - Soft   Assessment & Plan  1.  COMPLICATION OF GASTRIC BAND PROCEDURE (539.09  K95.09)  Impression: asthma is being aggravated by lapband.  2.  History of depression 3.  Asthma 4.  History of back pain   Ovidio Kin, MD, [facility] Pager: [phone] Office phone:  [phone]

## 2014-11-26 ENCOUNTER — Encounter (HOSPITAL_COMMUNITY)
Admission: RE | Disposition: A | Discharge status: Home / Self Care | Payer: Self-pay | Source: Ambulatory Visit | Attending: Surgery

## 2014-11-26 ENCOUNTER — Encounter (HOSPITAL_COMMUNITY): Payer: Self-pay

## 2014-11-26 ENCOUNTER — Ambulatory Visit (HOSPITAL_COMMUNITY)
Admission: RE | Admit: 2014-11-26 | Discharge: 2014-11-26 | Disposition: A | Discharge status: Home / Self Care | Payer: BLUE CROSS/BLUE SHIELD | Source: Ambulatory Visit | Attending: Surgery | Admitting: Surgery

## 2014-11-26 DIAGNOSIS — K209 Esophagitis, unspecified: Secondary | ICD-10-CM | POA: Insufficient documentation

## 2014-11-26 DIAGNOSIS — Z6841 Body Mass Index (BMI) 40.0 and over, adult: Secondary | ICD-10-CM | POA: Diagnosis not present

## 2014-11-26 DIAGNOSIS — J45901 Unspecified asthma with (acute) exacerbation: Secondary | ICD-10-CM | POA: Diagnosis not present

## 2014-11-26 DIAGNOSIS — E669 Obesity, unspecified: Secondary | ICD-10-CM | POA: Insufficient documentation

## 2014-11-26 DIAGNOSIS — Z9884 Bariatric surgery status: Secondary | ICD-10-CM | POA: Insufficient documentation

## 2014-11-26 DIAGNOSIS — R1110 Vomiting, unspecified: Secondary | ICD-10-CM | POA: Insufficient documentation

## 2014-11-26 HISTORY — PX: ESOPHAGOGASTRODUODENOSCOPY: SHX5428

## 2014-11-26 SURGERY — EGD (ESOPHAGOGASTRODUODENOSCOPY)
Anesthesia: Moderate Sedation

## 2014-11-26 MED ORDER — MIDAZOLAM HCL 5 MG/ML IJ SOLN
INTRAMUSCULAR | Status: AC
  Filled 2014-11-26: qty 2

## 2014-11-26 MED ORDER — BUTAMBEN-TETRACAINE-BENZOCAINE 2-2-14 % EX AERO
INHALATION_SPRAY | CUTANEOUS | Status: DC | PRN
  Administered 2014-11-26: 2 via TOPICAL

## 2014-11-26 MED ORDER — FENTANYL CITRATE (PF) 100 MCG/2ML IJ SOLN
INTRAMUSCULAR | Status: AC
  Filled 2014-11-26: qty 2

## 2014-11-26 MED ORDER — SODIUM CHLORIDE 0.9 % IV SOLN: INTRAVENOUS | Status: DC

## 2014-11-26 MED ORDER — PANTOPRAZOLE SODIUM 40 MG PO TBEC: 40.0000 mg | DELAYED_RELEASE_TABLET | Freq: Every day | ORAL | Status: DC

## 2014-11-26 MED ORDER — MIDAZOLAM HCL 10 MG/2ML IJ SOLN
INTRAMUSCULAR | Status: DC | PRN
  Administered 2014-11-26 (×2): 2 mg via INTRAVENOUS
  Administered 2014-11-26: 1 mg via INTRAVENOUS

## 2014-11-26 MED ORDER — FENTANYL CITRATE (PF) 100 MCG/2ML IJ SOLN
INTRAMUSCULAR | Status: DC | PRN
  Administered 2014-11-26 (×3): 25 ug via INTRAVENOUS

## 2014-11-26 NOTE — Discharge Instructions (Signed)
Esophagogastroduodenoscopy °Care After °Refer to this sheet in the next few weeks. These instructions provide you with information on caring for yourself after your procedure. Your caregiver may also give you more specific instructions. Your treatment has been planned according to current medical practices, but problems sometimes occur. Call your caregiver if you have any problems or questions after your procedure.  °HOME CARE INSTRUCTIONS °· Do not eat or drink anything until the numbing medicine (local anesthetic) has worn off and your gag reflex has returned. You will know that the local anesthetic has worn off when you can swallow comfortably. °· Do not drive for 12 hours after the procedure or as directed by your caregiver. °· Only take medicines as directed by your caregiver. °SEEK MEDICAL CARE IF:  °· You cannot stop coughing. °· You are not urinating at all or less than usual. °SEEK IMMEDIATE MEDICAL CARE IF: °· You have difficulty swallowing. °· You cannot eat or drink. °· You have worsening throat or chest pain. °· You have dizziness, lightheadedness, or you faint. °· You have nausea or vomiting. °· You have chills. °· You have a fever. °· You have severe abdominal pain. °· You have black, tarry, or bloody stools. °Document Released: 03/01/2012 Document Reviewed: 03/01/2012 °ExitCare® Patient Information ©2015 [employer]. This information is not intended to replace advice given to you by your health care provider. Make sure you discuss any questions you have with your health care provider. ° °

## 2014-11-26 NOTE — Op Note (Signed)
11/26/2014  8:17 AM  PATIENT:  Samantha Romero, 39 y.o., female, MRN: [id number]  PREOP DIAGNOSIS:  Lap band, history of vomiting and pain  POSTOP DIAGNOSIS:   Normal lap band position, mild esophagitis  PROCEDURE:  Esophagogastroduedonoscopy  SURGEON:   Ovidio Kin, M.D.  ANESTHESIA:   Fentanyl  75 mcg   Versed 5 mg  INDICATIONS FOR PROCEDURE:  Samantha Romero is a 39 y.o. (DOB: [birth year])  AA  female whose primary care physician is Laurell Josephs, MD and comes for upper endoscopy to evaluate a lap band.   She had a lap placed by Dr. Sheron Nightingale 12/26/2007.  The lap band required a port revision in May 2013.  [name] has struggled with the lap band and never had successful weight loss.  She vomits a couple of times a week and feels some discomfort with the lap band.  She had an UGI on 06/28/2014 that raised some question of narrowing at the gastroesophageal junction.   The indications and risks of the endoscopy were explained to the patient.  The risks include, but are not limited to, perforation, bleeding, or injury to the bowel.  PROCEDURE:  The patient was monitored with a pulse oximetry, BP cuff, and EKG.  The patient has nasal O2 flowing during the procedure.   The back of the throat was anesthestized with Ceticaine.  A flexible Pentax endoscope was passed down the throat without difficulty.  Findings include:   Esophagus:   She has a least one area of esophagitis at the GE junction   GE junction at:  35 cm   Lap band:  The lap band orifice is at 38 cm.  Looks normal.  No evidence of erosion.  The lap band is in good position   Stomach: Normal.   Duodenum:   Normal.  Normal pylorus  PLAN:  I gave her Protonix 40 mg QD for the esophagitis.    I wonder whether she would benefit from nutrition consult to review the lap band diet.  She would like the lap band removed.  Her mother was with her.  I went over this with both of them and gave them photos of the procedure.  Ovidio Kin, MD, [facility] Pager: [phone] Office phone:  [phone]

## 2014-11-26 NOTE — Interval H&P Note (Signed)
History and Physical Interval Note:  11/26/2014 7:28 AM  Samantha Romero  has presented today for surgery, with the diagnosis of gastic band erosion  The various methods of treatment have been discussed with the patient and family. Her mother is here with her.  After consideration of risks, benefits and other options for treatment, the patient has consented to  Procedure(s): ESOPHAGOGASTRODUODENOSCOPY (EGD) (N/A) as a surgical intervention .  The patient's history has been reviewed, patient examined, no change in status, stable for surgery.  I have reviewed the patient's chart and labs.  Questions were answered to the patient's satisfaction.     Viliami Bracco H

## 2014-11-29 ENCOUNTER — Encounter (HOSPITAL_COMMUNITY): Payer: Self-pay | Admitting: Surgery

## 2014-12-16 ENCOUNTER — Encounter (HOSPITAL_COMMUNITY): Payer: Self-pay | Admitting: *Deleted

## 2014-12-16 ENCOUNTER — Emergency Department (HOSPITAL_COMMUNITY)
Admission: EM | Admit: 2014-12-16 | Discharge: 2014-12-16 | Discharge status: Against Medical Advice | Payer: BLUE CROSS/BLUE SHIELD | Attending: Emergency Medicine | Admitting: Emergency Medicine

## 2014-12-16 ENCOUNTER — Emergency Department (HOSPITAL_COMMUNITY): Payer: BLUE CROSS/BLUE SHIELD

## 2014-12-16 DIAGNOSIS — R079 Chest pain, unspecified: Secondary | ICD-10-CM | POA: Insufficient documentation

## 2014-12-16 DIAGNOSIS — R0602 Shortness of breath: Secondary | ICD-10-CM | POA: Diagnosis not present

## 2014-12-16 LAB — CBC
HEMATOCRIT: 36.3 % (ref 36.0–46.0)
HEMOGLOBIN: 12.3 g/dL (ref 12.0–15.0)
MCH: 28.2 pg (ref 26.0–34.0)
MCHC: 33.9 g/dL (ref 30.0–36.0)
MCV: 83.3 fL (ref 78.0–100.0)
Platelets: 217 10*3/uL (ref 150–400)
RBC: 4.36 MIL/uL (ref 3.87–5.11)
RDW: 12.5 % (ref 11.5–15.5)
WBC: 7.2 10*3/uL (ref 4.0–10.5)

## 2014-12-16 LAB — BASIC METABOLIC PANEL
ANION GAP: 5 (ref 5–15)
BUN: 9 mg/dL (ref 6–20)
CALCIUM: 8.7 mg/dL — AB (ref 8.9–10.3)
CO2: 26 mmol/L (ref 22–32)
Chloride: 106 mmol/L (ref 101–111)
Creatinine, Ser: 0.71 mg/dL (ref 0.44–1.00)
GFR calc non Af Amer: >60 mL/min (ref >60)
Glucose, Bld: 95 mg/dL (ref 65–99)
POTASSIUM: 3.4 mmol/L — AB (ref 3.5–5.1)
Sodium: 137 mmol/L (ref 135–145)

## 2014-12-16 LAB — I-STAT TROPONIN, ED: TROPONIN I, POC: 0.01 ng/mL (ref 0.00–0.08)

## 2014-12-16 NOTE — ED Notes (Signed)
Pt complains of central chest pain and shortness of breath since 2 hours ago. Pt states the pain started while she was driving. Pt has hx of anxiety and esophagitis. Pt denies lightheadedness or diaphoresis.

## 2014-12-16 NOTE — ED Notes (Signed)
Pt called from triage, no answer 

## 2014-12-20 ENCOUNTER — Ambulatory Visit: Payer: Self-pay | Admitting: Surgery

## 2014-12-20 NOTE — H&P (Signed)
Chief Complaint:  Failure to be able to adjust lapband with GER  History of Present Illness:  Samantha Romero is an 39 y.o. female with history of APL Lapband placed Sept 2009.  She has had trouble with port access, port leak, and GER.  Her insurance declined her request to seek a change from lapband to sleeve gastrectomy.  Will move to port revision and possible band re-siting   Past Medical History  Diagnosis Date  . Sinus problem   . No pertinent past medical history     Past Surgical History  Procedure Laterality Date  . Laparoscopic gastric banding  12/26/2007  . Gastric banding port revision  08/24/11  . No past surgeries    . Esophagogastroduodenoscopy N/A 11/26/2014    Procedure: ESOPHAGOGASTRODUODENOSCOPY (EGD);  Surgeon: Ovidio Kin, MD;  Location: [facility];  Service: General;  Laterality: N/A;    Current Outpatient Prescriptions  Medication Sig Dispense Refill  . acetaminophen (TYLENOL) 100 MG/ML solution Take 10 mg/kg by mouth every 4 (four) hours as needed for fever.    . ALBUTEROL IN Inhale 2 puffs into the lungs every 4 (four) hours as needed. For shortness of breath    . ALPRAZolam (XANAX) 0.5 MG tablet Take 1 tablet (0.5 mg total) by mouth at bedtime as needed for sleep. 30 tablet 1  . azithromycin (ZITHROMAX) 250 MG tablet     . cetirizine (ZYRTEC) 10 MG tablet Take 10 mg by mouth daily with breakfast.    . Fluticasone-Salmeterol (ADVAIR DISKUS) 250-50 MCG/DOSE AEPB Inhale 1 puff into the lungs 2 (two) times daily.     . montelukast (SINGULAIR) 10 MG tablet Take 1 tablet (10 mg total) by mouth at bedtime. NEEDS OFFICE VISIT/RECHECK 30 tablet 0  . pantoprazole (PROTONIX) 40 MG tablet Take 1 tablet (40 mg total) by mouth daily. 30 tablet 3  . predniSONE (DELTASONE) 10 MG tablet     . PROAIR HFA 108 (90 BASE) MCG/ACT inhaler     . sertraline (ZOLOFT) 50 MG tablet Take 1/2 tab (25 mg) PO QD for 8 days then take 1 tab (50 mg) PO QD 40 tablet 1  . tiZANidine (ZANAFLEX) 2  MG tablet Take 2 mg by mouth every 6 (six) hours as needed for muscle spasms.     No current facility-administered medications for this visit.   Shellfish allergy Family History  Problem Relation Age of Onset  . Cancer Maternal Uncle     lung  . Cancer Maternal Grandfather     lung and back   Social History:   reports that she has never smoked. She has never used smokeless tobacco. She reports that she drinks alcohol. She reports that she does not use illicit drugs.   REVIEW OF SYSTEMS : Negative except for see problem list  Physical Exam:   Last menstrual period 11/26/2014. There is no weight on file to calculate BMI.  Gen:  WDWN AAF NAD  Neurological: Alert and oriented to person, place, and time. Motor and sensory function is grossly intact  Head: Normocephalic and atraumatic.  Eyes: Conjunctivae are normal. Pupils are equal, round, and reactive to light. No scleral icterus.  Neck: Normal range of motion. Neck supple. No tracheal deviation or thyromegaly present.  Cardiovascular:  SR without murmurs or gallops.  No carotid bruits Breast:  Not examined Respiratory: Effort normal.  No respiratory distress. No chest wall tenderness. Breath sounds normal.  No wheezes, rales or rhonchi.  Abdomen:  nontender except site of  lapband port in the right upper quadrant GU:  Not examined.   Musculoskeletal: Normal range of motion. Extremities are nontender. No cyanosis, edema or clubbing noted Lymphadenopathy: No cervical, preauricular, postauricular or axillary adenopathy is present Skin: Skin is warm and dry. No rash noted. No diaphoresis. No erythema. No pallor. Pscyh: Normal mood and affect. Behavior is normal. Judgment and thought content normal.   LABORATORY RESULTS: No results found for this or any previous visit (from the past 48 hour(s)).   RADIOLOGY RESULTS: No results found.  Problem List: Patient Active Problem List   Diagnosis Date Noted  . Depression, acute  03/01/2012  . Asthma attack 09/27/2011  . Lapband port leaking-REVISED May 2013 09/10/2011  . 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 Sinus problem     Assessment & Plan: lapband port and band dysfunction;  Plan port and lapband revision    Matt B. Daphine Deutscher, MD, [facility] [phone] beeper [phone]  12/20/2014 9:14 AM

## 2014-12-23 NOTE — Patient Instructions (Signed)
Samantha Romero  12/23/2014   Your procedure is scheduled on:   01/06/2015    Report to [facility] Main  Entrance take [place]  elevators to 3rd floor to  Short Stay Center at   0915 AM.  Call this number if you have problems the morning of surgery [phone]   Remember: ONLY 1 PERSON MAY GO WITH YOU TO SHORT STAY TO GET  READY MORNING OF YOUR SURGERY.  Do not eat food or drink liquids :After Midnight.     Take these medicines the morning of surgery with A SIP OF WATER:  Albuterol Inhaler if needed and bring, Xanax if needed, Amlodipine ( NORvasc), Wellbutrin, Zyrtec, Flonase, singulair, Prilosec, protonix, Zantac, buspar                                You may not have any metal on your body including hair pins and              piercings  Do not wear jewelry, make-up, lotions, powders or perfumes, deodorant             Do not wear nail polish.  Do not shave  48 hours prior to surgery.                 Do not bring valuables to the hospital. [facility] IS NOT             RESPONSIBLE   FOR VALUABLES.  Contacts, dentures or bridgework may not be worn into surgery.  Leave suitcase in the car. After surgery it may be brought to your room.        Special Instructions:coughing and deep breathing exercises, leg exercises               Please read over the following fact sheets you were given: _____________________________________________________________________             [facility] - Preparing for Surgery Before surgery, you can play an important role.  Because skin is not sterile, your skin needs to be as free of germs as possible.  You can reduce the number of germs on your skin by washing with CHG (chlorahexidine gluconate) soap before surgery.  CHG is an antiseptic cleaner which kills germs and bonds with the skin to continue killing germs even after washing. Please DO NOT use if you have an allergy to CHG or antibacterial soaps.  If your skin becomes  reddened/irritated stop using the CHG and inform your nurse when you arrive at Short Stay. Do not shave (including legs and underarms) for at least 48 hours prior to the first CHG shower.  You may shave your face/neck. Please follow these instructions carefully:  1.  Shower with CHG Soap the night before surgery and the  morning of Surgery.  2.  If you choose to wash your hair, wash your hair first as usual with your  normal  shampoo.  3.  After you shampoo, rinse your hair and body thoroughly to remove the  shampoo.                           4.  Use CHG as you would any other liquid soap.  You can apply chg directly  to the skin and wash  Gently with a scrungie or clean washcloth.  5.  Apply the CHG Soap to your body ONLY FROM THE NECK DOWN.   Do not use on face/ open                           Wound or open sores. Avoid contact with eyes, ears mouth and genitals (private parts).                       Wash face,  Genitals (private parts) with your normal soap.             6.  Wash thoroughly, paying special attention to the area where your surgery  will be performed.  7.  Thoroughly rinse your body with warm water from the neck down.  8.  DO NOT shower/wash with your normal soap after using and rinsing off  the CHG Soap.                9.  Pat yourself dry with a clean towel.            10.  Wear clean pajamas.            11.  Place clean sheets on your bed the night of your first shower and do not  sleep with pets. Day of Surgery : Do not apply any lotions/deodorants the morning of surgery.  Please wear clean clothes to the hospital/surgery center.  FAILURE TO FOLLOW THESE INSTRUCTIONS MAY RESULT IN THE CANCELLATION OF YOUR SURGERY PATIENT SIGNATURE_________________________________  NURSE SIGNATURE__________________________________  ________________________________________________________________________

## 2014-12-23 NOTE — Progress Notes (Signed)
Consent form has abbreviation.  Preop appointment on 12/24/2014 at 1100am.  Please correct.  Thank You.

## 2014-12-24 ENCOUNTER — Encounter (HOSPITAL_COMMUNITY)
Admission: RE | Admit: 2014-12-24 | Discharge: 2014-12-24 | Disposition: A | Discharge status: Home / Self Care | Payer: BLUE CROSS/BLUE SHIELD | Source: Ambulatory Visit | Attending: Surgery | Admitting: Surgery

## 2014-12-24 ENCOUNTER — Encounter (HOSPITAL_COMMUNITY): Payer: Self-pay

## 2014-12-24 DIAGNOSIS — Z01818 Encounter for other preprocedural examination: Secondary | ICD-10-CM | POA: Insufficient documentation

## 2014-12-24 HISTORY — DX: Unspecified asthma, uncomplicated: J45.909

## 2014-12-24 HISTORY — DX: Depression, unspecified: F32.A

## 2014-12-24 HISTORY — DX: Essential (primary) hypertension: I10

## 2014-12-24 HISTORY — DX: Major depressive disorder, single episode, unspecified: F32.9

## 2014-12-24 HISTORY — DX: Anxiety disorder, unspecified: F41.9

## 2014-12-24 LAB — CBC WITH DIFFERENTIAL/PLATELET
BASOS ABS: 0 10*3/uL (ref 0.0–0.1)
BASOS PCT: 0 %
EOS ABS: 0.2 10*3/uL (ref 0.0–0.7)
EOS PCT: 4 %
HEMATOCRIT: 38.6 % (ref 36.0–46.0)
Hemoglobin: 12.9 g/dL (ref 12.0–15.0)
Lymphocytes Relative: 42 %
Lymphs Abs: 2.1 10*3/uL (ref 0.7–4.0)
MCH: 28.1 pg (ref 26.0–34.0)
MCHC: 33.4 g/dL (ref 30.0–36.0)
MCV: 84.1 fL (ref 78.0–100.0)
MONO ABS: 0.3 10*3/uL (ref 0.1–1.0)
MONOS PCT: 6 %
Neutro Abs: 2.5 10*3/uL (ref 1.7–7.7)
Neutrophils Relative %: 48 %
PLATELETS: 245 10*3/uL (ref 150–400)
RBC: 4.59 MIL/uL (ref 3.87–5.11)
RDW: 12.7 % (ref 11.5–15.5)
WBC: 5.1 10*3/uL (ref 4.0–10.5)

## 2014-12-24 LAB — COMPREHENSIVE METABOLIC PANEL
ALBUMIN: 4.1 g/dL (ref 3.5–5.0)
ALT: 15 U/L (ref 14–54)
ANION GAP: 5 (ref 5–15)
AST: 20 U/L (ref 15–41)
Alkaline Phosphatase: 82 U/L (ref 38–126)
BILIRUBIN TOTAL: 0.4 mg/dL (ref 0.3–1.2)
BUN: 11 mg/dL (ref 6–20)
CHLORIDE: 107 mmol/L (ref 101–111)
CO2: 26 mmol/L (ref 22–32)
Calcium: 8.9 mg/dL (ref 8.9–10.3)
Creatinine, Ser: 0.63 mg/dL (ref 0.44–1.00)
GFR calc Af Amer: >60 mL/min (ref >60)
GFR calc non Af Amer: >60 mL/min (ref >60)
GLUCOSE: 95 mg/dL (ref 65–99)
POTASSIUM: 3.5 mmol/L (ref 3.5–5.1)
SODIUM: 138 mmol/L (ref 135–145)
TOTAL PROTEIN: 7.3 g/dL (ref 6.5–8.1)

## 2014-12-24 NOTE — Progress Notes (Addendum)
Requested LOV note from Dr Farris Has at [facility] .  Patient had chest pain last week of (12/17/14) in [facility].  Patient stated she had followup at Dr Farris Has and patient stated it was anxiety. EKG-12/17/14 in EPIC CXR- 11/2014 in EPIC along with Labs done in ED.

## 2014-12-25 NOTE — Progress Notes (Signed)
LOV note from Dr Kateri Plummer on chart - 12/18/2014.  Patient came to ED with chest pain .  Left ED after 4 hours per note.  Patient evaluated by PCP- Dr Kateri Plummer.  EKG, labs CXr- EPIC.  Patient reports no further chest pain at time of preop appointment.

## 2015-01-06 ENCOUNTER — Ambulatory Visit (HOSPITAL_COMMUNITY): Payer: BLUE CROSS/BLUE SHIELD | Admitting: Anesthesiology

## 2015-01-06 ENCOUNTER — Ambulatory Visit (HOSPITAL_COMMUNITY)
Admission: RE | Admit: 2015-01-06 | Discharge: 2015-01-07 | Disposition: A | Discharge status: Home / Self Care | Payer: BLUE CROSS/BLUE SHIELD | Source: Ambulatory Visit | Attending: Surgery | Admitting: Surgery

## 2015-01-06 ENCOUNTER — Encounter (HOSPITAL_COMMUNITY): Payer: Self-pay | Admitting: Anesthesiology

## 2015-01-06 ENCOUNTER — Encounter (HOSPITAL_COMMUNITY)
Admission: RE | Disposition: A | Discharge status: Home / Self Care | Payer: Self-pay | Source: Ambulatory Visit | Attending: Surgery

## 2015-01-06 DIAGNOSIS — Z6841 Body Mass Index (BMI) 40.0 and over, adult: Secondary | ICD-10-CM | POA: Diagnosis not present

## 2015-01-06 DIAGNOSIS — J45909 Unspecified asthma, uncomplicated: Secondary | ICD-10-CM | POA: Insufficient documentation

## 2015-01-06 DIAGNOSIS — K209 Esophagitis, unspecified: Secondary | ICD-10-CM | POA: Diagnosis not present

## 2015-01-06 DIAGNOSIS — Z91013 Allergy to seafood: Secondary | ICD-10-CM | POA: Insufficient documentation

## 2015-01-06 DIAGNOSIS — K219 Gastro-esophageal reflux disease without esophagitis: Secondary | ICD-10-CM | POA: Insufficient documentation

## 2015-01-06 DIAGNOSIS — F329 Major depressive disorder, single episode, unspecified: Secondary | ICD-10-CM | POA: Insufficient documentation

## 2015-01-06 DIAGNOSIS — F419 Anxiety disorder, unspecified: Secondary | ICD-10-CM | POA: Insufficient documentation

## 2015-01-06 DIAGNOSIS — K9509 Other complications of gastric band procedure: Secondary | ICD-10-CM | POA: Insufficient documentation

## 2015-01-06 DIAGNOSIS — Y838 Other surgical procedures as the cause of abnormal reaction of the patient, or of later complication, without mention of misadventure at the time of the procedure: Secondary | ICD-10-CM | POA: Diagnosis not present

## 2015-01-06 DIAGNOSIS — K660 Peritoneal adhesions (postprocedural) (postinfection): Secondary | ICD-10-CM | POA: Diagnosis not present

## 2015-01-06 DIAGNOSIS — Z9884 Bariatric surgery status: Secondary | ICD-10-CM

## 2015-01-06 HISTORY — PX: LAPAROSCOPIC REPAIR AND REMOVAL OF GASTRIC BAND: SHX5919

## 2015-01-06 HISTORY — PX: LAPAROSCOPIC LYSIS OF ADHESIONS: SHX5905

## 2015-01-06 LAB — CREATININE, SERUM
CREATININE: 0.85 mg/dL (ref 0.44–1.00)
GFR calc Af Amer: >60 mL/min (ref >60)

## 2015-01-06 LAB — CBC
HCT: 37.6 % (ref 36.0–46.0)
HEMOGLOBIN: 12.2 g/dL (ref 12.0–15.0)
MCH: 27.9 pg (ref 26.0–34.0)
MCHC: 32.4 g/dL (ref 30.0–36.0)
MCV: 85.8 fL (ref 78.0–100.0)
PLATELETS: 233 10*3/uL (ref 150–400)
RBC: 4.38 MIL/uL (ref 3.87–5.11)
RDW: 13 % (ref 11.5–15.5)
WBC: 10.2 10*3/uL (ref 4.0–10.5)

## 2015-01-06 LAB — PREGNANCY, URINE: Preg Test, Ur: NEGATIVE

## 2015-01-06 SURGERY — LAPAROSCOPIC REPAIR AND REMOVAL OF GASTRIC BAND
Anesthesia: General | Site: Abdomen

## 2015-01-06 MED ORDER — ROCURONIUM BROMIDE 100 MG/10ML IV SOLN
INTRAVENOUS | Status: AC
  Filled 2015-01-06: qty 1

## 2015-01-06 MED ORDER — SUCCINYLCHOLINE CHLORIDE 20 MG/ML IJ SOLN
INTRAMUSCULAR | Status: DC | PRN
  Administered 2015-01-06: 140 mg via INTRAVENOUS

## 2015-01-06 MED ORDER — SUGAMMADEX SODIUM 500 MG/5ML IV SOLN
INTRAVENOUS | Status: AC
  Filled 2015-01-06: qty 5

## 2015-01-06 MED ORDER — FENTANYL CITRATE (PF) 250 MCG/5ML IJ SOLN
INTRAMUSCULAR | Status: AC
  Filled 2015-01-06: qty 25

## 2015-01-06 MED ORDER — POTASSIUM CL IN DEXTROSE 5% 20 MEQ/L IV SOLN
20.0000 meq | INTRAVENOUS | Status: DC
  Administered 2015-01-06: 20 meq via INTRAVENOUS
  Filled 2015-01-06 (×3): qty 1000

## 2015-01-06 MED ORDER — HEPARIN SODIUM (PORCINE) 5000 UNIT/ML IJ SOLN
5000.0000 [IU] | Freq: Three times a day (TID) | INTRAMUSCULAR | Status: DC
  Administered 2015-01-06 – 2015-01-07 (×2): 5000 [IU] via SUBCUTANEOUS
  Filled 2015-01-06 (×5): qty 1

## 2015-01-06 MED ORDER — PROPOFOL 10 MG/ML IV BOLUS
INTRAVENOUS | Status: AC
  Filled 2015-01-06: qty 20

## 2015-01-06 MED ORDER — FENTANYL CITRATE (PF) 100 MCG/2ML IJ SOLN
INTRAMUSCULAR | Status: DC | PRN
  Administered 2015-01-06 (×4): 50 ug via INTRAVENOUS
  Administered 2015-01-06: 150 ug via INTRAVENOUS
  Administered 2015-01-06: 50 ug via INTRAVENOUS

## 2015-01-06 MED ORDER — FLUTICASONE PROPIONATE 50 MCG/ACT NA SUSP
1.0000 | Freq: Every morning | NASAL | Status: DC
  Filled 2015-01-06: qty 16

## 2015-01-06 MED ORDER — HYDROMORPHONE HCL 1 MG/ML IJ SOLN
INTRAMUSCULAR | Status: AC
  Filled 2015-01-06: qty 1

## 2015-01-06 MED ORDER — PROMETHAZINE HCL 25 MG/ML IJ SOLN: 6.2500 mg | INTRAMUSCULAR | Status: DC | PRN

## 2015-01-06 MED ORDER — SODIUM CHLORIDE 0.9 % IJ SOLN
INTRAMUSCULAR | Status: AC
  Filled 2015-01-06: qty 50

## 2015-01-06 MED ORDER — PHENYLEPHRINE 40 MCG/ML (10ML) SYRINGE FOR IV PUSH (FOR BLOOD PRESSURE SUPPORT)
PREFILLED_SYRINGE | INTRAVENOUS | Status: AC
  Filled 2015-01-06: qty 10

## 2015-01-06 MED ORDER — BUPIVACAINE LIPOSOME 1.3 % IJ SUSP
20.0000 mL | Freq: Once | INTRAMUSCULAR | Status: AC
  Administered 2015-01-06: 20 mL
  Filled 2015-01-06: qty 20

## 2015-01-06 MED ORDER — CHLORHEXIDINE GLUCONATE CLOTH 2 % EX PADS: 6.0000 | MEDICATED_PAD | Freq: Once | CUTANEOUS | Status: DC

## 2015-01-06 MED ORDER — METOCLOPRAMIDE HCL 5 MG/ML IJ SOLN
INTRAMUSCULAR | Status: DC | PRN
  Administered 2015-01-06: 10 mg via INTRAVENOUS

## 2015-01-06 MED ORDER — ALBUTEROL SULFATE HFA 108 (90 BASE) MCG/ACT IN AERS: 2.0000 | INHALATION_SPRAY | Freq: Four times a day (QID) | RESPIRATORY_TRACT | Status: DC | PRN

## 2015-01-06 MED ORDER — PREMIER PROTEIN SHAKE
2.0000 [oz_av] | Freq: Four times a day (QID) | ORAL | Status: DC
  Administered 2015-01-07: 2 [oz_av] via ORAL
  Filled 2015-01-06: qty 325.31

## 2015-01-06 MED ORDER — ALBUTEROL SULFATE (2.5 MG/3ML) 0.083% IN NEBU: 2.5000 mg | INHALATION_SOLUTION | Freq: Four times a day (QID) | RESPIRATORY_TRACT | Status: DC | PRN

## 2015-01-06 MED ORDER — LIDOCAINE HCL (CARDIAC) 20 MG/ML IV SOLN
INTRAVENOUS | Status: AC
  Filled 2015-01-06: qty 5

## 2015-01-06 MED ORDER — SUGAMMADEX SODIUM 200 MG/2ML IV SOLN
INTRAVENOUS | Status: DC | PRN
  Administered 2015-01-06: 500 mg via INTRAVENOUS

## 2015-01-06 MED ORDER — DEXAMETHASONE SODIUM PHOSPHATE 10 MG/ML IJ SOLN
INTRAMUSCULAR | Status: DC | PRN
  Administered 2015-01-06: 10 mg via INTRAVENOUS

## 2015-01-06 MED ORDER — HEPARIN SODIUM (PORCINE) 5000 UNIT/ML IJ SOLN
5000.0000 [IU] | INTRAMUSCULAR | Status: AC
  Administered 2015-01-06: 5000 [IU] via SUBCUTANEOUS
  Filled 2015-01-06: qty 1

## 2015-01-06 MED ORDER — DEXAMETHASONE SODIUM PHOSPHATE 10 MG/ML IJ SOLN
INTRAMUSCULAR | Status: AC
Start: 2015-01-06 — End: 2015-01-06
  Filled 2015-01-06: qty 1

## 2015-01-06 MED ORDER — HYDROMORPHONE HCL 1 MG/ML IJ SOLN
0.2500 mg | INTRAMUSCULAR | Status: DC | PRN
  Administered 2015-01-06 (×4): 0.5 mg via INTRAVENOUS

## 2015-01-06 MED ORDER — PROPOFOL 10 MG/ML IV BOLUS
INTRAVENOUS | Status: DC | PRN
  Administered 2015-01-06: 200 mg via INTRAVENOUS

## 2015-01-06 MED ORDER — ONDANSETRON HCL 4 MG/2ML IJ SOLN
INTRAMUSCULAR | Status: DC | PRN
  Administered 2015-01-06: 4 mg via INTRAVENOUS

## 2015-01-06 MED ORDER — ONDANSETRON HCL 4 MG/2ML IJ SOLN
4.0000 mg | INTRAMUSCULAR | Status: DC | PRN
  Administered 2015-01-06: 4 mg via INTRAVENOUS
  Filled 2015-01-06: qty 2

## 2015-01-06 MED ORDER — PANTOPRAZOLE SODIUM 40 MG IV SOLR
40.0000 mg | Freq: Every day | INTRAVENOUS | Status: DC
  Administered 2015-01-06: 40 mg via INTRAVENOUS
  Filled 2015-01-06 (×2): qty 40

## 2015-01-06 MED ORDER — DEXTROSE 5 % IV SOLN
2.0000 g | INTRAVENOUS | Status: AC
  Administered 2015-01-06 (×2): 2 g via INTRAVENOUS

## 2015-01-06 MED ORDER — ONDANSETRON HCL 4 MG/2ML IJ SOLN
INTRAMUSCULAR | Status: AC
  Filled 2015-01-06: qty 2

## 2015-01-06 MED ORDER — METOCLOPRAMIDE HCL 5 MG/ML IJ SOLN
INTRAMUSCULAR | Status: AC
  Filled 2015-01-06: qty 2

## 2015-01-06 MED ORDER — LACTATED RINGERS IV SOLN
INTRAVENOUS | Status: DC | PRN
  Administered 2015-01-06 (×2): via INTRAVENOUS

## 2015-01-06 MED ORDER — MIDAZOLAM HCL 5 MG/5ML IJ SOLN
INTRAMUSCULAR | Status: DC | PRN
  Administered 2015-01-06: 2 mg via INTRAVENOUS

## 2015-01-06 MED ORDER — ACETAMINOPHEN 160 MG/5ML PO SOLN
650.0000 mg | ORAL | Status: DC | PRN
  Administered 2015-01-07: 650 mg via ORAL
  Filled 2015-01-06: qty 20.3

## 2015-01-06 MED ORDER — SODIUM CHLORIDE 0.9 % IJ SOLN
INTRAMUSCULAR | Status: DC | PRN
  Administered 2015-01-06: 12 mL
  Administered 2015-01-06: 10 mL

## 2015-01-06 MED ORDER — ESMOLOL HCL 10 MG/ML IV SOLN
INTRAVENOUS | Status: DC | PRN
  Administered 2015-01-06 (×2): 10 mg via INTRAVENOUS

## 2015-01-06 MED ORDER — PHENYLEPHRINE HCL 10 MG/ML IJ SOLN
INTRAMUSCULAR | Status: DC | PRN
  Administered 2015-01-06 (×6): 80 ug via INTRAVENOUS

## 2015-01-06 MED ORDER — ACETAMINOPHEN 160 MG/5ML PO SOLN
325.0000 mg | ORAL | Status: DC | PRN
Start: 2015-01-06 — End: 2015-01-07

## 2015-01-06 MED ORDER — MORPHINE SULFATE (PF) 2 MG/ML IV SOLN
2.0000 mg | INTRAVENOUS | Status: DC | PRN
  Administered 2015-01-06: 2 mg via INTRAVENOUS
  Administered 2015-01-06 – 2015-01-07 (×3): 4 mg via INTRAVENOUS
  Filled 2015-01-06 (×2): qty 2
  Filled 2015-01-06: qty 1
  Filled 2015-01-06: qty 2

## 2015-01-06 MED ORDER — DEXTROSE 5 % IV SOLN
INTRAVENOUS | Status: AC
  Filled 2015-01-06: qty 2

## 2015-01-06 MED ORDER — ROCURONIUM BROMIDE 100 MG/10ML IV SOLN
INTRAVENOUS | Status: DC | PRN
  Administered 2015-01-06 (×2): 10 mg via INTRAVENOUS
  Administered 2015-01-06: 5 mg via INTRAVENOUS
  Administered 2015-01-06: 40 mg via INTRAVENOUS
  Administered 2015-01-06 (×2): 10 mg via INTRAVENOUS
  Administered 2015-01-06: 5 mg via INTRAVENOUS

## 2015-01-06 MED ORDER — OXYCODONE HCL 5 MG/5ML PO SOLN
5.0000 mg | ORAL | Status: DC | PRN
  Administered 2015-01-07 (×2): 10 mg via ORAL
  Filled 2015-01-06 (×2): qty 10

## 2015-01-06 MED ORDER — LIDOCAINE HCL (CARDIAC) 20 MG/ML IV SOLN
INTRAVENOUS | Status: DC | PRN
  Administered 2015-01-06: 50 mg via INTRAVENOUS

## 2015-01-06 MED ORDER — MIDAZOLAM HCL 2 MG/2ML IJ SOLN
INTRAMUSCULAR | Status: AC
  Filled 2015-01-06: qty 4

## 2015-01-06 MED ORDER — 0.9 % SODIUM CHLORIDE (POUR BTL) OPTIME
TOPICAL | Status: DC | PRN
  Administered 2015-01-06: 1000 mL

## 2015-01-06 MED ORDER — POTASSIUM CL IN DEXTROSE 5% 20 MEQ/L IV SOLN: 20.0000 meq | INTRAVENOUS | Status: DC

## 2015-01-06 MED ORDER — INFLUENZA VAC SPLIT QUAD 0.5 ML IM SUSY
0.5000 mL | PREFILLED_SYRINGE | INTRAMUSCULAR | Status: DC
  Filled 2015-01-06 (×2): qty 0.5

## 2015-01-06 SURGICAL SUPPLY — 54 items
ACC PORT GSTRC BAND STD KT HI (Band) ×1 IMPLANT
APL SKNCLS STERI-STRIP NONHPOA (GAUZE/BANDAGES/DRESSINGS)
BENZOIN TINCTURE PRP APPL 2/3 (GAUZE/BANDAGES/DRESSINGS) IMPLANT
BLADE HEX COATED 2.75 (ELECTRODE) ×2 IMPLANT
BLADE SURG 15 STRL LF DISP TIS (BLADE) ×1 IMPLANT
BLADE SURG 15 STRL SS (BLADE) ×2
COVER SURGICAL LIGHT HANDLE (MISCELLANEOUS) ×2 IMPLANT
DECANTER SPIKE VIAL GLASS SM (MISCELLANEOUS) ×4 IMPLANT
DISSECTOR BLUNT TIP ENDO 5MM (MISCELLANEOUS) IMPLANT
DRAPE CAMERA CLOSED 9X96 (DRAPES) ×2 IMPLANT
ELECT PENCIL ROCKER SW 15FT (MISCELLANEOUS) ×2 IMPLANT
ELECT REM PT RETURN 9FT ADLT (ELECTROSURGICAL) ×2
ELECTRODE REM PT RTRN 9FT ADLT (ELECTROSURGICAL) ×1 IMPLANT
GAUZE SPONGE 4X4 12PLY STRL (GAUZE/BANDAGES/DRESSINGS) ×2 IMPLANT
GLOVE BIOGEL M 8.0 STRL (GLOVE) ×2 IMPLANT
GLOVE BIOGEL PI IND STRL 7.0 (GLOVE) ×1 IMPLANT
GLOVE BIOGEL PI INDICATOR 7.0 (GLOVE) ×1
GOWN SPEC L4 XLG W/TWL (GOWN DISPOSABLE) ×2 IMPLANT
GOWN STRL REUS W/TWL XL LVL3 (GOWN DISPOSABLE) ×6 IMPLANT
HOVERMATT SINGLE USE (MISCELLANEOUS) ×2 IMPLANT
KIT ACCESS PORT VG (Band) ×1 IMPLANT
KIT BASIN OR (CUSTOM PROCEDURE TRAY) ×2 IMPLANT
LIQUID BAND (GAUZE/BANDAGES/DRESSINGS) ×1 IMPLANT
MESH HERNIA 1X4 RECT BARD (Mesh General) IMPLANT
MESH HERNIA BARD 1X4 (Mesh General) ×1 IMPLANT
NDL ACCESS PORT 3.5 (NEEDLE) IMPLANT
NDL SPNL 22GX3.5 QUINCKE BK (NEEDLE) ×1 IMPLANT
NEEDLE ACCESS PORT 3.5 (NEEDLE) ×2 IMPLANT
NEEDLE SPNL 22GX3.5 QUINCKE BK (NEEDLE) ×2 IMPLANT
NS IRRIG 1000ML POUR BTL (IV SOLUTION) ×2 IMPLANT
PACK UNIVERSAL I (CUSTOM PROCEDURE TRAY) ×2 IMPLANT
SCISSORS LAP 5X45 EPIX DISP (ENDOMECHANICALS) ×2 IMPLANT
SET IRRIG TUBING LAPAROSCOPIC (IRRIGATION / IRRIGATOR) IMPLANT
SHEARS CURVED HARMONIC AC 45CM (MISCELLANEOUS) ×1 IMPLANT
SLEEVE ADV FIXATION 5X100MM (TROCAR) ×2 IMPLANT
SOLUTION ANTI FOG 6CC (MISCELLANEOUS) ×2 IMPLANT
SPONGE LAP 18X18 X RAY DECT (DISPOSABLE) ×2 IMPLANT
STAPLER VISISTAT 35W (STAPLE) ×2 IMPLANT
STRIP CLOSURE SKIN 1/2X4 (GAUZE/BANDAGES/DRESSINGS) IMPLANT
SUT PROLENE 2 0 CT2 30 (SUTURE) ×1 IMPLANT
SUT SILK 2 0 SH (SUTURE) ×1 IMPLANT
SUT VIC AB 2-0 SH 27 (SUTURE)
SUT VIC AB 2-0 SH 27X BRD (SUTURE) IMPLANT
SUT VIC AB 4-0 SH 18 (SUTURE) ×3 IMPLANT
SYR 10ML ECCENTRIC (SYRINGE) ×1 IMPLANT
SYR 20CC LL (SYRINGE) ×2 IMPLANT
TOWEL OR 17X26 10 PK STRL BLUE (TOWEL DISPOSABLE) ×4 IMPLANT
TOWEL OR NON WOVEN STRL DISP B (DISPOSABLE) ×2 IMPLANT
TROCAR 12M 150ML BLUNT (TROCAR) ×1 IMPLANT
TROCAR ADV FIXATION 5X100MM (TROCAR) ×2 IMPLANT
TROCAR BLADELESS 15MM (ENDOMECHANICALS) ×2 IMPLANT
TROCAR BLADELESS OPT 5 100 (ENDOMECHANICALS) ×2 IMPLANT
TROCAR BLADELESS OPT 5 150 (ENDOMECHANICALS) ×3 IMPLANT
TUBING INSUFFLATION 10FT LAP (TUBING) ×2 IMPLANT

## 2015-01-06 NOTE — Anesthesia Preprocedure Evaluation (Signed)
Anesthesia Evaluation  Patient identified by MRN, date of birth, ID band Patient awake    Reviewed: Allergy & Precautions, NPO status , Patient's Chart, lab work & pertinent test results  Airway Mallampati: II  TM Distance: >3 FB Neck ROM: Full    Dental no notable dental hx.    Pulmonary asthma , pneumonia, resolved,    Pulmonary exam normal breath sounds clear to auscultation       Cardiovascular hypertension, Pt. on medications Normal cardiovascular exam Rhythm:Regular Rate:Normal     Neuro/Psych PSYCHIATRIC DISORDERS Anxiety Depression negative neurological ROS     GI/Hepatic Neg liver ROS, GERD  ,  Endo/Other  Morbid obesity  Renal/GU negative Renal ROS  negative genitourinary   Musculoskeletal negative musculoskeletal ROS (+)   Abdominal (+) + obese,   Peds negative pediatric ROS (+)  Hematology negative hematology ROS (+)   Anesthesia Other Findings   Reproductive/Obstetrics negative OB ROS                             Anesthesia Physical Anesthesia Plan  ASA: III  Anesthesia Plan: General   Post-op Pain Management:    Induction: Intravenous  Airway Management Planned: Oral ETT  Additional Equipment:   Intra-op Plan:   Post-operative Plan: Extubation in OR  Informed Consent: I have reviewed the patients History and Physical, chart, labs and discussed the procedure including the risks, benefits and alternatives for the proposed anesthesia with the patient or authorized representative who has indicated his/her understanding and acceptance.   Dental advisory given  Plan Discussed with: CRNA  Anesthesia Plan Comments:         Anesthesia Quick Evaluation

## 2015-01-06 NOTE — H&P (View-Only) (Signed)
Chief Complaint:  Failure to be able to adjust lapband with GER  History of Present Illness:  Samantha Romero is an 39 y.o. female with history of APL Lapband placed Sept 2009.  She has had trouble with port access, port leak, and GER.  Her insurance declined her request to seek a change from lapband to sleeve gastrectomy.  Will move to port revision and possible band re-siting   Past Medical History  Diagnosis Date  . Sinus problem   . No pertinent past medical history     Past Surgical History  Procedure Laterality Date  . Laparoscopic gastric banding  12/26/2007  . Gastric banding port revision  08/24/11  . No past surgeries    . Esophagogastroduodenoscopy N/A 11/26/2014    Procedure: ESOPHAGOGASTRODUODENOSCOPY (EGD);  Surgeon: David Newman, MD;  Location: [facility];  Service: General;  Laterality: N/A;    Current Outpatient Prescriptions  Medication Sig Dispense Refill  . acetaminophen (TYLENOL) 100 MG/ML solution Take 10 mg/kg by mouth every 4 (four) hours as needed for fever.    . ALBUTEROL IN Inhale 2 puffs into the lungs every 4 (four) hours as needed. For shortness of breath    . ALPRAZolam (XANAX) 0.5 MG tablet Take 1 tablet (0.5 mg total) by mouth at bedtime as needed for sleep. 30 tablet 1  . azithromycin (ZITHROMAX) 250 MG tablet     . cetirizine (ZYRTEC) 10 MG tablet Take 10 mg by mouth daily with breakfast.    . Fluticasone-Salmeterol (ADVAIR DISKUS) 250-50 MCG/DOSE AEPB Inhale 1 puff into the lungs 2 (two) times daily.     . montelukast (SINGULAIR) 10 MG tablet Take 1 tablet (10 mg total) by mouth at bedtime. NEEDS OFFICE VISIT/RECHECK 30 tablet 0  . pantoprazole (PROTONIX) 40 MG tablet Take 1 tablet (40 mg total) by mouth daily. 30 tablet 3  . predniSONE (DELTASONE) 10 MG tablet     . PROAIR HFA 108 (90 BASE) MCG/ACT inhaler     . sertraline (ZOLOFT) 50 MG tablet Take 1/2 tab (25 mg) PO QD for 8 days then take 1 tab (50 mg) PO QD 40 tablet 1  . tiZANidine (ZANAFLEX) 2  MG tablet Take 2 mg by mouth every 6 (six) hours as needed for muscle spasms.     No current facility-administered medications for this visit.   Shellfish allergy Family History  Problem Relation Age of Onset  . Cancer Maternal Uncle     lung  . Cancer Maternal Grandfather     lung and back   Social History:   reports that she has never smoked. She has never used smokeless tobacco. She reports that she drinks alcohol. She reports that she does not use illicit drugs.   REVIEW OF SYSTEMS : Negative except for see problem list  Physical Exam:   Last menstrual period 11/26/2014. There is no weight on file to calculate BMI.  Gen:  WDWN AAF NAD  Neurological: Alert and oriented to person, place, and time. Motor and sensory function is grossly intact  Head: Normocephalic and atraumatic.  Eyes: Conjunctivae are normal. Pupils are equal, round, and reactive to light. No scleral icterus.  Neck: Normal range of motion. Neck supple. No tracheal deviation or thyromegaly present.  Cardiovascular:  SR without murmurs or gallops.  No carotid bruits Breast:  Not examined Respiratory: Effort normal.  No respiratory distress. No chest wall tenderness. Breath sounds normal.  No wheezes, rales or rhonchi.  Abdomen:  nontender except site of   lapband port in the right upper quadrant GU:  Not examined.   Musculoskeletal: Normal range of motion. Extremities are nontender. No cyanosis, edema or clubbing noted Lymphadenopathy: No cervical, preauricular, postauricular or axillary adenopathy is present Skin: Skin is warm and dry. No rash noted. No diaphoresis. No erythema. No pallor. Pscyh: Normal mood and affect. Behavior is normal. Judgment and thought content normal.   LABORATORY RESULTS: No results found for this or any previous visit (from the past 48 hour(s)).   RADIOLOGY RESULTS: No results found.  Problem List: Patient Active Problem List   Diagnosis Date Noted  . Depression, acute  03/01/2012  . Asthma attack 09/27/2011  . Lapband port leaking-REVISED May 2013 09/10/2011  . 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 Sinus problem     Assessment & Plan: lapband port and band dysfunction;  Plan port and lapband revision    Matt B. Daphine Deutscher, MD, [facility] [phone] beeper [phone]  12/20/2014 9:14 AM

## 2015-01-06 NOTE — Anesthesia Postprocedure Evaluation (Signed)
  Anesthesia Post-op Note  Patient: Samantha Romero  Procedure(s) Performed: Procedure(s) (LRB): LAPAROSCOPIC RESIGHTING OF GASTRIC Band PORT (N/A) LAPAROSCOPIC LYSIS OF ADHESIONS (N/A)  Patient Location: PACU  Anesthesia Type: General  Level of Consciousness: awake and alert   Airway and Oxygen Therapy: Patient Spontanous Breathing  Post-op Pain: mild  Post-op Assessment: Post-op Vital signs reviewed, Patient's Cardiovascular Status Stable, Respiratory Function Stable, Patent Airway and No signs of Nausea or vomiting. Suggammadex used, but patient has IUD and not on hormonal birth control.  Last Vitals:  Filed Vitals:   01/06/15 1649  BP: 113/71  Pulse: 69  Temp: 36.6 C  Resp: 15    Post-op Vital Signs: stable   Complications: No apparent anesthesia complications

## 2015-01-06 NOTE — Anesthesia Procedure Notes (Signed)
Procedure Name: Intubation Date/Time: 01/06/2015 11:13 AM Performed by: Dandre Sisler, Nuala Alpha Pre-anesthesia Checklist: Patient identified, Emergency Drugs available, Suction available, Patient being monitored and Timeout performed Patient Re-evaluated:Patient Re-evaluated prior to inductionOxygen Delivery Method: Circle system utilized Preoxygenation: Pre-oxygenation with 100% oxygen Intubation Type: IV induction Ventilation: Mask ventilation without difficulty Laryngoscope Size: Mac and 4 Grade View: Grade I Tube type: Oral Tube size: 7.5 mm Number of attempts: 1 Airway Equipment and Method: Stylet Placement Confirmation: ETT inserted through vocal cords under direct vision,  positive ETCO2,  CO2 detector and breath sounds checked- equal and bilateral Secured at: 21 cm Tube secured with: Tape Dental Injury: Teeth and Oropharynx as per pre-operative assessment

## 2015-01-06 NOTE — Op Note (Signed)
RUSHELL BRESNAN [id number] 1975-04-11 01/06/2015  Preoperative diagnosis: morbid obesity, history of laparoscopic adjustable gastric banding  Postoperative diagnosis: Same   Procedure: Upper endoscopy   Surgeon: Atilano Ina M.D., FACS   Anesthesia: Gen.   Indications for procedure: 39 yo female undergoing a revision of her adjustable gastric band and upper endoscopy was requested to evaluate the anastomosis.  Description of procedure:  I scrubbed out and obtained the Olympus endoscope. I gently placed endoscope in the patient's oropharynx and gently glided it down the esophagus without any difficulty under direct visualization. Once I was in the distal esophagus, I insufflated with air. GE junction at 39. Some evidence of mild esophagitis in distal esophagus just above GE junction. The scope advanced into the stomach and insuffulated. I visualized the pylorus. No evidence of gastritis. I retroflexed the scope and visualized the imprint of the adjustable gastric band. Dr Daphine Deutscher did a saline challenge test of the band thru the port. We could visualize the band inflating with saline. The saline was withdrawn. I pulled the scope back. The pouch appeared normal size.  The scope was withdrawn. The patient tolerated this portion of the procedure well. Please see Dr Ermalene Searing operative note for details regarding the remainder of the procedure.   Mary Sella. Andrey Campanile, MD, FACS General, Bariatric, & Minimally Invasive Surgery [facility], [place]

## 2015-01-06 NOTE — Interval H&P Note (Signed)
History and Physical Interval Note:  01/06/2015 10:54 AM  Samantha Romero  has presented today for surgery, with the diagnosis of Morbid Obesity  The various methods of treatment have been discussed with the patient and family. After consideration of risks, benefits and other options for treatment, the patient has consented to  Procedure(s): LAPAROSCOPIC REPAIR GASTRIC Band PORT (N/A) as a surgical intervention .  The patient's history has been reviewed, patient examined, no change in status, stable for surgery.  I have reviewed the patient's chart and labs.  Questions were answered to the patient's satisfaction.     Macallister Ashmead B

## 2015-01-06 NOTE — Transfer of Care (Signed)
Immediate Anesthesia Transfer of Care Note  Patient: Samantha Romero  Procedure(s) Performed: Procedure(s): LAPAROSCOPIC RESIGHTING OF GASTRIC Band PORT (N/A) LAPAROSCOPIC LYSIS OF ADHESIONS (N/A)  Patient Location: PACU  Anesthesia Type:General  Level of Consciousness:  sedated, patient cooperative and responds to stimulation  Airway & Oxygen Therapy:Patient Spontanous Breathing and Patient connected to face mask oxgen  Post-op Assessment:  Report given to PACU RN and Post -op Vital signs reviewed and stable  Post vital signs:  Reviewed and stable  Last Vitals:  Filed Vitals:   01/06/15 1406  BP: 153/91  Pulse:   Temp:   Resp: 10    Complications: No apparent anesthesia complications

## 2015-01-06 NOTE — Op Note (Signed)
Surgeon: Wenda Low, MD, FACS  Asst:  Gaynelle Adu, MD, FACS  Anes:  general  Procedure: Laparoscopy; enterolysis (1.5 hrs); endoscopy per Dr. Andrey Campanile, resiting of lapband port  Diagnosis: Difficulty properly restricting lapband  Complications: none  EBL:   20 cc  Drains: none  Description of Procedure:  The patient was taken to OR 4 at [facility].  After anesthesia was administered and the patient was prepped a timeout was performed.  Two attempts with regular length 5 mm Optiview trocars were performed without success until 150 mm trocar was used.  Adhesions were severe in the right upper quadrant and to the midline.  1.5 hr enterolysis was done placing several trocars around.  The band was identified and was secured by scar.  I couldn't get above the liver to replicate or resite the band.  The endoscopy was performed and the lapband appeared to be in good position and no erosions.    The port was resited to the right of midline just above the umbilicus and placed in a subcutaneous position.  The tubing had been a part of the problem and this was freed from the anterior abdominal wall.  There appeared to be 8 cc of fluid in the APL lapband.  This was replaced in the new port that was installed.  The incisions were injected with Exparel and closed with 4-0 vicryl and Liquiban.    The patient tolerated the procedure well and was taken to the PACU in stable condition.     Matt B. Daphine Deutscher, MD, [facility], [place] [phone]

## 2015-01-06 NOTE — Progress Notes (Signed)
GER used for indication reason on consent order. Called Dr. Daphine Deutscher for clarification with consent. Dr. Daphine Deutscher clarified: gastroesophageal reflux.

## 2015-01-06 NOTE — Anesthesia Postprocedure Evaluation (Signed)
  Anesthesia Post-op Note  Patient: Samantha Romero  Procedure(s) Performed: Procedure(s) (LRB): LAPAROSCOPIC RESIGHTING OF GASTRIC Band PORT (N/A) LAPAROSCOPIC LYSIS OF ADHESIONS (N/A)  Patient Location: PACU  Anesthesia Type: General  Level of Consciousness: awake and alert   Airway and Oxygen Therapy: Patient Spontanous Breathing  Post-op Pain: mild  Post-op Assessment: Post-op Vital signs reviewed, Patient's Cardiovascular Status Stable, Respiratory Function Stable, Patent Airway and No signs of Nausea or vomiting  Last Vitals:  Filed Vitals:   01/06/15 1649  BP: 113/71  Pulse: 69  Temp: 36.6 C  Resp: 15    Post-op Vital Signs: stable   Complications: No apparent anesthesia complications

## 2015-01-07 DIAGNOSIS — K9509 Other complications of gastric band procedure: Secondary | ICD-10-CM | POA: Diagnosis not present

## 2015-01-07 MED ORDER — OXYCODONE HCL 5 MG/5ML PO SOLN: 5.0000 mg | ORAL | Status: DC | PRN

## 2015-01-07 NOTE — Progress Notes (Signed)
Patient is alert and oriented.  Pain is controlled, and patient is tolerating fluids.  Advanced to protein shake today, patient tolerated well.  Reviewed Adjustable gastric band discharge instructions with patient, patient able to articulate understanding.  Provided information on BELT program, Support Group and [facility] outpatient pharmacy. All questions answered, will continue to monitor.  

## 2015-01-07 NOTE — Discharge Summary (Signed)
Physician Discharge Summary  Patient ID: Samantha Romero MRN: [id number] DOB/AGE: [birth year] 38 y.o.  Admit date: 01/06/2015 Discharge date: 01/07/2015  Admission Diagnoses:  lapband dysfunction  Discharge Diagnoses:  same  Active Problems:   Status post gastric banding   Surgery:  Laparoscopic enterolysis and resite of port  Discharged Condition: stable  Hospital Course:   Had surgery.  Doing well managing pain.  Ready for discharge on PD 1  Consults: none  Significant Diagnostic Studies: none    Discharge Exam: Blood pressure 114/64, pulse 60, temperature 98.8 F (37.1 C), temperature source Oral, resp. rate 16, height  (1.549 m), weight 126.009 kg (277 lb 12.8 oz), last menstrual period 12/30/2014, SpO2 99 %. Incisions OK but OK  Disposition: 07-Left Against Medical Advice  Discharge Instructions    Ambulate hourly while awake    Complete by:  As directed      Call MD for:  difficulty breathing, headache or visual disturbances    Complete by:  As directed      Call MD for:  persistant dizziness or light-headedness    Complete by:  As directed      Call MD for:  persistant nausea and vomiting    Complete by:  As directed      Call MD for:  redness, tenderness, or signs of infection (pain, swelling, redness, odor or green/yellow discharge around incision site)    Complete by:  As directed      Call MD for:  severe uncontrolled pain    Complete by:  As directed      Call MD for:  temperature >101 F    Complete by:  As directed      Diet bariatric full liquid    Complete by:  As directed      Incentive spirometry    Complete by:  As directed   Perform hourly while awake            Medication List    TAKE these medications        ALPRAZolam 1 MG tablet  Commonly known as:  XANAX  Take 1 mg by mouth 3 (three) times daily as needed for anxiety.     ALPRAZolam 0.5 MG tablet  Commonly known as:  XANAX  Take 1 tablet (0.5 mg total) by mouth at bedtime  as needed for sleep.     amLODipine 5 MG tablet  Commonly known as:  NORVASC  Take 5 mg by mouth every morning.     buPROPion 150 MG 12 hr tablet  Commonly known as:  WELLBUTRIN SR  Take 150 mg by mouth every morning.     busPIRone 10 MG tablet  Commonly known as:  BUSPAR  Take 10 mg by mouth 2 (two) times daily.     cetirizine 10 MG tablet  Commonly known as:  ZYRTEC  Take 10 mg by mouth daily with breakfast.     fluticasone 50 MCG/ACT nasal spray  Commonly known as:  FLONASE  Place 1 spray into both nostrils every morning.     lisinopril 10 MG tablet  Commonly known as:  PRINIVIL,ZESTRIL  Take 10 mg by mouth every morning.     montelukast 10 MG tablet  Commonly known as:  SINGULAIR  Take 1 tablet (10 mg total) by mouth at bedtime. NEEDS OFFICE VISIT/RECHECK     omeprazole 40 MG capsule  Commonly known as:  PRILOSEC  Take 40 mg by mouth every morning.  ondansetron 4 MG tablet  Commonly known as:  ZOFRAN  Take 4 mg by mouth every 4 (four) hours as needed for nausea or vomiting.     oxyCODONE 5 MG/5ML solution  Commonly known as:  ROXICODONE  Take 5 mLs (5 mg total) by mouth every 4 (four) hours as needed for moderate pain or severe pain.     pantoprazole 40 MG tablet  Commonly known as:  PROTONIX  Take 1 tablet (40 mg total) by mouth daily.     PROAIR HFA 108 (90 BASE) MCG/ACT inhaler  Generic drug:  albuterol  Inhale 2 puffs into the lungs every 6 (six) hours as needed for wheezing or shortness of breath.     ranitidine 300 MG tablet  Commonly known as:  ZANTAC  Take 300 mg by mouth every morning.     sertraline 50 MG tablet  Commonly known as:  ZOLOFT  Take 1/2 tab (25 mg) PO QD for 8 days then take 1 tab (50 mg) PO QD     tiZANidine 2 MG tablet  Commonly known as:  ZANAFLEX  Take 2 mg by mouth every 6 (six) hours as needed for muscle spasms.           Follow-up Information    Follow up with Luretha Murphy B, MD In 2 weeks.   Specialty:   General Surgery   Contact information:   [address] [place] [address] [phone]       Signed: Valarie Merino 01/07/2015, 10:14 AM

## 2015-01-07 NOTE — Progress Notes (Signed)
Patient alert and oriented, Post op day 1.  Provided support and encouragement.  Encouraged pulmonary toilet, ambulation and small sips of liquids.  All questions answered.  Will continue to monitor. 

## 2015-01-07 NOTE — Discharge Instructions (Signed)
Laparoscopic Gastric Band Surgery, Care After Refer to this sheet in the next few weeks. These instructions provide you with information about caring for yourself after your procedure. Your health care provider may also give you more specific instructions. Your treatment has been planned according to current medical practices, but problems sometimes occur. Call your health care provider if you have any problems or questions after your procedure. WHAT TO EXPECT AFTER THE PROCEDURE After your procedure, it is common to have pain. HOME CARE INSTRUCTIONS Eating and Drinking  Follow instructions from your health care provider about:  What to eat and how much to eat. This is very important.  How long you need to stay on a liquid diet.  Taking any vitamins or protein supplements.  You will begin by drinking liquids in small amounts such as tea, juice, broth, and gelatin.  Do not drink caffeine for one month or as told by your health care provider.  Drink enough fluid to keep your urine clear or pale yellow. Activities  Take walks throughout the day. Do not sit for longer than one hour while you are awake. This is important for 4-6 weeks or for as long as told by your health care provider.  Do your coughing and deep breathing exercises as told by your health care provider.  Do not lift, push, or pull anything that is heavier than 10 lb (4.5 kg) or as told by your health care provider.  Do not drive or operate heavy machinery while taking prescription pain medicine.  Avoid strenuous activities for as long as told by your health care provider.  Return to your normal activities as told by your health care provider. Ask your health care provider what activities are safe for you. Incision Care  Do not take baths, swim, or use a hot tub until your health care provider says that it is okay. You may shower when your health care provider says that it is okay.  Pat the incisions dry. Do not rub  them.  Follow instructions from your health care provider about how to take care of your incisions. Make sure you:  Wash your hands with soap and water before you change your bandage (dressing). If soap and water are not available, use hand sanitizer.  Change your dressing as told by your health care provider.  Leave stitches (sutures), skin glue, or adhesive strips in place. These skin closures may need to be in place for 2 weeks or longer. If adhesive strip edges start to loosen and curl up, you may trim the loose edges. Do not remove adhesive strips completely unless your health care provider tells you to do that.  Check your incision area every day for signs of infection. Watch for:  Redness, swelling, or pain.  Fluid, blood, or pus. General Instructions  Take over-the-counter and prescription medicines only as told by your health care provider.  Keep all follow-up visits as told by your health care provider. This is important.  Do not use tobacco products, including cigarettes, chewing tobacco, or e-cigarettes. If you need help quitting, ask your health care provider. SEEK MEDICAL CARE IF:   You have a fever or chills.  You have a cough that does not go away.  Your medicine does not help your pain. SEEK IMMEDIATE MEDICAL CARE IF:  You feel nauseous or you vomit.  You have pain or discomfort with swallowing.  You develop shortness of breath or chest pain.  You have redness, swelling, or pain at  the site of your incisions.  You have fluid, blood, or pus coming from your incisions.  You have very bad pain in your leg.  Your stool is black, tarry, or dark red.  You feel confused.  You have slurred speech.  You feel light-headed when standing or you suddenly feel weak.   This information is not intended to replace advice given to you by your health care provider. Make sure you discuss any questions you have with your health care provider.   Document Released:  10/28/2003 Document Revised: 12/04/2014 Document Reviewed: 03/26/2014 Elsevier Interactive Patient Education 2016 [employer]                    ADJUSTABLE GASTRIC BAND  Home Care Instructions   These instructions are to help you care for yourself when you go home.  Call: If you have any problems. Call [phone] and ask for the surgeon on call If you need immediate assistance come to the ER at [facility] Tell the ER staff you are a new post-op gastric banding patient  Signs and symptoms to report: Severe  vomiting or nausea If you cannot handle clear liquids for longer than 1 day, call your surgeon Abdominal pain which does not get better after taking your pain medication Fever greater than 100.4  F and chills Heart rate over 100 beats a minute Trouble breathing Chest pain Redness,  swelling, drainage, or foul odor at incision (surgical) sites If your incisions open or pull apart Swelling or pain in calf (lower leg) Diarrhea (Loose bowel movements that happen often), frequent watery, uncontrolled bowel movements Constipation, (no bowel movements for 3 days) if this happens: Take Milk of Magnesia, 2 tablespoons by mouth, 3 times a day for 2 days if needed Stop taking Milk of Magnesia once you have had a bowel movement Call your doctor if constipation continues Or Take Miralax  (instead of Milk of Magnesia) following the label instructions Stop taking Miralax once you have had a bowel movement Call your doctor if constipation continues Anything you think is abnormal for you   Normal side effects after surgery: Unable to sleep at night or unable to concentrate Irritability Being tearful (crying) or depressed  These are common complaints, possibly related to your anesthesia, stress of surgery, and change in lifestyle, that usually go away a few weeks after surgery. If these feelings continue, call your medical doctor.  Wound Care: You may have surgical glue,  steri-strips, or staples over your incisions after surgery Surgical glue: Looks like clear film over your incisions and will wear off a little at a time Steri-strips: Adhesive strips of tape over your incisions. You may notice a yellowish color on skin under the steri-strips. This is used to make the steri-strips stick better. Do not pull the steri-strips off - let them fall off Staples: Staples may be removed before you leave the hospital If you go home with staples, call [facility] for an appointment with your surgeons nurse to have staples removed 10 days after surgery, [phone] Showering: You may shower two (2) days after your surgery unless your surgeon tells you differently Wash gently around incisions with warm soapy water, rinse well, and gently pat dry If you have a drain (tube from your incision), you may need someone to hold this while you shower No tub baths until staples are removed and incisions are healed   Medications: Medications should be liquid or crushed if larger than the size  of a dime Extended release pills (medication that releases a little bit at a time through the  day) should not be crushed Depending on the size and number of medications you take, you may need to space (take a few throughout the day)/change the time you take your medications so that you do not over-fill your pouch (smaller stomach) Make sure you follow-up with you primary care physician to make medication changes needed during rapid weight loss and life -style changes If you have diabetes, follow up with your doctor that orders your diabetes medication(s) within one week after surgery and check your blood sugar regularly  Do not drive while taking narcotics (pain medications)  Do not take acetaminophen (Tylenol) and Roxicet or Lortab Elixir at the same time since these pain medications contain acetaminophen   Diet:  First 2 Weeks You will see the nutritionist about two (2) weeks  after your surgery. The nutritionist will increase the types of foods you can eat if you are handling liquids well: If you have severe vomiting or nausea and cannot handle clear liquids lasting longer than 1 day call your surgeon For Same Day Surgery Discharge Patients: The day of surgery drink water only: 2 ounces every 4 hours If you are handling water, start drinking your high protein shake the next morning For Overnight Stay Patients: Begin by drinking 2 ounces of a high protein every 3 hours, 5-6 times per day Slowly increase the amount you drink as tolerated You may find it easier to slowly sip shakes throughout the day. It is important to get your proteins in first    Protein Shake Drink at least 2 ounces of shake 5-6 times per day Each serving of protein shakes (usually 8-12 ounces) should have a minimum of: 15 grams of protein And no more than 5 grams of carbohydrate Goal for protein each day: Men = 80 grams per day Women = 60 grams per day Protein powder may be added to fluids such as non-fat milk or Lactaid milk or Soy milk (limit to 35 grams added protein powder per serving)  Hydration Slowly increase the amount of water and other clear liquids as tolerated (See Acceptable Fluids) Slowly increase the amount of protein shake as tolerated Sip fluids slowly and throughout the day May use sugar substitutes in small amounts (no more than 6-8 packets per day; i.e. Splenda)  Fluid Goal The first goal is to drink at least 8 ounces of protein shake/drink per day (or as directed by the nutritionist); some examples of protein shakes are Syntrax, Nectar, Dillard's, EAS Edge HP, and Unjury. - See handout from pre-op Bariatric Education Class: Slowly increase the amount of protein shake you drink as tolerated You may find it easier to slowly sip shakes throughout the day It is important to get your proteins in first Your fluid goal is to drink 64-100 ounces of fluid daily It may  take a few weeks to build up to this  32 oz. (or more) should be full liquids (see below for examples) Liquids should not contain sugar, caffeine, or carbonation  Clear Liquids: Water of Sugar-free flavored water (i.e. Fruit HO, Propel) Decaffeinated coffee or tea (sugar-free) Crystal lite, Wylers Lite, Min[place] Maid Lite Sugar-free Jell-O Bouillon or broth Sugar-free Popsicle:    - Less than 20 calories each; Limit 1 per day        Full Liquids:  Protein Shakes/Drinks + 2 choices per day of other full liquids Full liquids must be: No More Than 12 grams of Carbs per serving No More Than 3 grams of Fat per serving Strained low-fat cream soup Non-Fat milk Fat-free Lactaid Milk Sugar-free yogurt (Dannon Lite & Fit, Greek yogurt)   Vitamins and Minerals Start 1 day after surgery unless otherwise directed by your surgeon 1 Chewable Multivitamin / Multimineral Supplement with iron (i.e. Centrum for Adults) Chewable Calcium Citrate with Vitamin D-3 (Example: 3 Chewable Calcium  Plus 600 with vitamin D-3) Take 500 mg three (3) times a day for a total of 1500 mg per day Do not take all 3 doses of calcium at one time as it may cause constipation, and you can only absorb 500 mg at a time Do not mix multivitamins containing iron with calcium supplements;  take 2 hours apart Do not substitute Tums (calcium carbonate) for your calcium Menstruating women and those at risk for anemia ( a blood disease that causes weakness) may need extra iron Talk to your doctor to see if you need more iron If you need extra iron: total daily iron recommendation (including Vitamins) is 50 to 100 mg Iron/day Do not stop taking or change any vitamins or minerals until you talk to your nutritionist or surgeon Your nutritionist and/or surgeon must approve all vitamin and mineral supplements  Activity and Exercise: It is important to continue walking at home. Limit your physical activity as  instructed by your doctor. During this time, use these guidelines: Do not lift anything greater than ten  (10) pounds for at least two (2) weeks Do not go back to work or drive until Designer, industrial/product says you can You may have sex when you feel comfortable It is VERY important for female patients to use a reliable birth control method; fertility often increase after surgery Do not get pregnant for at least 18 months Start exercising as soon as your doctor tells you that you can Make sure your doctor approves any physical activity Start with a simple walking program Walk 5-15 minutes each day, 7 days per week Slowly increase until you are walking 30-45 minutes per day Consider joining our BELT program. [phone] or email [email or web address]    Special Instructions  Things to remember: Free counseling is available for you and your family through collaboration between [facility] and [place]. Please call [phone] and leave a message Use your CPAP when sleeping if this applies to you Consider buying a medical alert bracelet that says you had lap-band surgery You will likely have your first fill (fluid added to your band) 6 - 8 weeks after surgery [facility] has a free Bariatric Surgery Support Group that meets monthly, the 3rd Thursday, 6pm. Calvert Cantor. You can see classes online at [email or web address] It is very important to keep all follow up appointments with your surgeon, nutritionist, primary care physician, and behavioral health practitioner After the first year, please follow up with your bariatric surgeon and nutritionist at least once a year in order to maintain best weight loss results                    [facility]:  [phone]               [facility]: [phone]               Bariatric Nurse Coordinator: [phone]  Adjustable Gastric Band Home Care Instructions  Rev.  04/2012                                                                  Reviewed and Endorsed                                                   by [facility] Patient Education Committee, Jan, 2014

## 2015-01-07 NOTE — Progress Notes (Signed)
Pt to be discharged today. Offered to give flu vaccine prior to leaving yet pt refused. Stated " I would like to wait a few more days." Plans to f/u with PCP.

## 2015-01-07 NOTE — Progress Notes (Signed)
Assessment unchanged. Pt and mom verbalized understanding of dc instructions through teach back. Skip Estimable, RN in to complete bariatric teach ing earlier in shift. Discharged via wc to front entrance tp meet mom and awaiting vehicle to carry home. Accompanied by NT.

## 2015-01-15 ENCOUNTER — Telehealth (HOSPITAL_COMMUNITY): Payer: Self-pay

## 2015-01-15 NOTE — Telephone Encounter (Signed)
Attempted discharge call back, no answer, left message to return call  Made discharge phone call to patient per DROP protocol. Asking the following questions.    1. Do you have someone to care for you now that you are home?   2. Are you having pain now that is not relieved by your pain medication?   3. Are you able to drink the recommended daily amount of fluids (48 ounces minimum/day) and protein (60-80 grams/day) as prescribed by the dietitian or nutritional counselor?   4. Are you taking the vitamins and minerals as prescribed?   5. Do you have the "on call" number to contact your surgeon if you have a problem or question?   6. Are your incisions free of redness, swelling or drainage? (If steri strips, address that these can fall off, shower as tolerated)  7. Have your bowels moved since your surgery?  If not, are you passing gas?   8. Are you up and walking 3-4 times per day?

## 2015-01-17 ENCOUNTER — Other Ambulatory Visit: Payer: Self-pay | Admitting: Surgery

## 2015-01-21 ENCOUNTER — Ambulatory Visit: Payer: Self-pay

## 2015-02-12 ENCOUNTER — Encounter: Payer: BLUE CROSS/BLUE SHIELD | Attending: Surgery | Admitting: Dietician

## 2015-02-12 ENCOUNTER — Encounter: Payer: Self-pay | Admitting: Dietician

## 2015-02-12 ENCOUNTER — Ambulatory Visit: Payer: Self-pay | Admitting: Dietician

## 2015-02-12 DIAGNOSIS — Z6841 Body Mass Index (BMI) 40.0 and over, adult: Secondary | ICD-10-CM | POA: Insufficient documentation

## 2015-02-12 DIAGNOSIS — Z713 Dietary counseling and surveillance: Secondary | ICD-10-CM | POA: Insufficient documentation

## 2015-02-12 NOTE — Progress Notes (Signed)
Follow-up visit:  5 Weeks Post-Operative LAGB port revision Surgery  Medical Nutrition Therapy:  Appt start time: 1455 end time:  1535.  Primary concerns today: Post-operative Bariatric Surgery Nutrition Management. Had surgery originally in 2009 and one other revision in 2013. This year was throwing up a lot which is why she had the revision. Has had a lot of problems with the lap band overall since getting it originally. Feels badly 1 hour after eating. Using smaller bowls.   Has lost 18.5 lbs since surgery 5 weeks ago. Finds that she is slipping into bad habits. Has been having Halloween candy. Has not had bread. Had some fluid removed on 01/21/2015. Feels like she is close to being in the green zone, though may need a little more fluid. Has a follow up appointment in early December for a possible fill.   Having a lot of sweet cravings. Would like to have some fruits.   Doesn't feel good to have a lot of food on her stomach at night.   Surgery date: 01/06/15 Surgery type: LAGB port revision [facility]: 265.5 lbs, 284 lbs on day of surgery per patient Weight today: 265.5 lbs   Weight change: 18.5 lbs  Total weight loss: 18.5 lbs Weight loss goal: 165 lbs  TANITA  BODY COMP RESULTS  02/12/15   BMI (kg/m^2) 48.6   Fat Mass (lbs) 141.0   Fat Free Mass (lbs) 124.5   Total Body Water (lbs) 91.0    Preferred Learning Style:   No preference indicated   Learning Readiness:   Ready  24-hr recall: B (AM): none Snk (11 AM): leftovers - 3-4 oz ground [place] with [place] yogurt and salsa  (21-28 g)  L (1 PM): 1 fried chicken tender with broccoli (7 g) Snk (PM): potato chips  D (PM): none or ground beef or chicken/tuna salad with [place] yogurt or meatballs with low sodium sauce (0-28 g) Snk (PM): SF jello and SF popsicle   **Has a premier protein shake every other day.  Fluid intake: Pepsi zero (small amount), 32 oz water, Powerade Zero Estimated total protein intake:  28-56 g  Medications: see list  Supplementation: not taking  Using straws: has a straw in her water bottle  Drinking while eating: not often, will have sips, usually waits 30 minutes to drink after eating Hair loss: No Carbonated beverages: Pepsi zero N/V/D/C: constipation d/t some medication Dumping syndrome: No Last Lap-Band fill: 01/21/2015 and fluid was removed  Recent physical activity:  Walks 20-40 minutes every other day   Progress Towards Goal(s):  In progress.  Handouts given during visit include:  Phase 3A High Protein  Phase 3B High Protein + Non Starchy Vegetables  High Protein Snacks   Nutritional Diagnosis:  [place]-3.3 Overweight/obesity related to past poor dietary habits and physical inactivity as evidenced by patient w/ recent LAGB revision surgery following dietary guidelines for continued weight loss.    Intervention:  Nutrition education. Goals:  Follow Phase 3B: High Protein + Non-Starchy Vegetables  Increase lean protein foods to meet 60g goal  Have protein every time you eat  Increase fluid intake to 64oz +  Avoid drinking 15 minutes before, during and 30 minutes after eating  Aim for >30 min of physical activity daily  Have protein first, then veggies, and have some fruit if you room or with yogurt if you are craving sweets  Limit sweets, fried foods, and carbs (except fruit)  Take 1 complete multivitamin with iron each day  and 1500 mg of calcium citrate with Vitamin D (citrical petites, take 500 mg 3 x day)    Teaching Method Utilized:  Visual Auditory Hands on  Barriers to learning/adherence to lifestyle change: sweet cravings  Demonstrated degree of understanding via:  Teach Back   Monitoring/Evaluation:  Dietary intake, exercise, lap band fills, and body weight. Follow up in 1 months for 2 month post-op visit.

## 2015-02-12 NOTE — Patient Instructions (Addendum)
Goals:  Follow Phase 3B: High Protein + Non-Starchy Vegetables  Increase lean protein foods to meet 60g goal  Have protein every time you eat  Increase fluid intake to 64oz +  Avoid drinking 15 minutes before, during and 30 minutes after eating  Aim for >30 min of physical activity daily  Have protein first, then veggies, and have some fruit if you room or with yogurt if you are craving sweets  Limit sweets, fried foods, and carbs (except fruit)  Take 1 complete multivitamin with iron each day and 1500 mg of calcium citrate with Vitamin D (citrical petites, take 500 mg 3 x day)

## 2015-02-23 ENCOUNTER — Emergency Department (HOSPITAL_BASED_OUTPATIENT_CLINIC_OR_DEPARTMENT_OTHER)
Admission: EM | Admit: 2015-02-23 | Discharge: 2015-02-23 | Disposition: A | Discharge status: Home / Self Care | Payer: BLUE CROSS/BLUE SHIELD | Attending: Emergency Medicine | Admitting: Emergency Medicine

## 2015-02-23 ENCOUNTER — Encounter (HOSPITAL_BASED_OUTPATIENT_CLINIC_OR_DEPARTMENT_OTHER): Payer: Self-pay | Admitting: *Deleted

## 2015-02-23 DIAGNOSIS — Z8701 Personal history of pneumonia (recurrent): Secondary | ICD-10-CM | POA: Diagnosis not present

## 2015-02-23 DIAGNOSIS — K2970 Gastritis, unspecified, without bleeding: Secondary | ICD-10-CM | POA: Insufficient documentation

## 2015-02-23 DIAGNOSIS — Z7951 Long term (current) use of inhaled steroids: Secondary | ICD-10-CM | POA: Diagnosis not present

## 2015-02-23 DIAGNOSIS — F329 Major depressive disorder, single episode, unspecified: Secondary | ICD-10-CM | POA: Diagnosis not present

## 2015-02-23 DIAGNOSIS — Z79899 Other long term (current) drug therapy: Secondary | ICD-10-CM | POA: Insufficient documentation

## 2015-02-23 DIAGNOSIS — I10 Essential (primary) hypertension: Secondary | ICD-10-CM | POA: Insufficient documentation

## 2015-02-23 DIAGNOSIS — M545 Low back pain, unspecified: Secondary | ICD-10-CM

## 2015-02-23 DIAGNOSIS — J45909 Unspecified asthma, uncomplicated: Secondary | ICD-10-CM | POA: Diagnosis not present

## 2015-02-23 DIAGNOSIS — K219 Gastro-esophageal reflux disease without esophagitis: Secondary | ICD-10-CM | POA: Diagnosis not present

## 2015-02-23 DIAGNOSIS — F419 Anxiety disorder, unspecified: Secondary | ICD-10-CM | POA: Diagnosis not present

## 2015-02-23 MED ORDER — METHOCARBAMOL 500 MG PO TABS: 1000.0000 mg | ORAL_TABLET | Freq: Four times a day (QID) | ORAL | Status: DC

## 2015-02-23 MED ORDER — HYDROCODONE-ACETAMINOPHEN 5-325 MG PO TABS: ORAL_TABLET | ORAL | Status: DC

## 2015-02-23 MED ORDER — FAMOTIDINE 20 MG PO TABS: 20.0000 mg | ORAL_TABLET | Freq: Two times a day (BID) | ORAL | Status: DC

## 2015-02-23 MED ORDER — KETOROLAC TROMETHAMINE 60 MG/2ML IM SOLN
60.0000 mg | Freq: Once | INTRAMUSCULAR | Status: AC
  Administered 2015-02-23: 60 mg via INTRAMUSCULAR
  Filled 2015-02-23: qty 2

## 2015-02-23 MED ORDER — GI COCKTAIL ~~LOC~~
30.0000 mL | Freq: Once | ORAL | Status: AC
  Administered 2015-02-23: 30 mL via ORAL
  Filled 2015-02-23: qty 30

## 2015-02-23 NOTE — Discharge Instructions (Signed)
Please read and follow all provided instructions.  Your diagnoses today include:  1. Bilateral low back pain without sciatica   2. Gastritis     Tests performed today include:  Vital signs - see below for your results today  Medications prescribed:   Vicodin (hydrocodone/acetaminophen) - narcotic pain medication  DO NOT drive or perform any activities that require you to be awake and alert because this medicine can make you drowsy. BE VERY CAREFUL not to take multiple medicines containing Tylenol (also called acetaminophen). Doing so can lead to an overdose which can damage your liver and cause liver failure and possibly death.   Robaxin (methocarbamol) - muscle relaxer medication  DO NOT drive or perform any activities that require you to be awake and alert because this medicine can make you drowsy.    Pepcid (famotidine) - antihistamine  You can find this medication over-the-counter.   DO NOT exceed:   20mg  Pepcid every 12 hours  Take any prescribed medications only as directed.  Home care instructions:   Follow any educational materials contained in this packet  Please rest, use ice or heat on your back for the next several days  Do not lift, push, pull anything more than 10 pounds for the next week  Follow-up instructions: Please follow-up with your primary care provider in the next 3 week for further evaluation of your symptoms if not improved.   Return instructions:  SEEK IMMEDIATE MEDICAL ATTENTION IF YOU HAVE:  New numbness, tingling, weakness, or problem with the use of your arms or legs  Severe back pain not relieved with medications  Loss control of your bowels or bladder  Increasing pain in any areas of the body (such as chest or abdominal pain)  Shortness of breath, dizziness, or fainting.   Worsening nausea (feeling sick to your stomach), vomiting, fever, or sweats  Any other emergent concerns regarding your health   Additional  Information:  Your vital signs today were: BP 139/91 mmHg   Pulse 55   Temp(Src) 98.7 F (37.1 C) (Oral)   Resp 16   Ht 5\' 2"  (1.575 m)   Wt 117.935 kg   BMI 47.54 kg/m2   SpO2 100%   LMP 02/09/2015 (Approximate) If your blood pressure (BP) was elevated above 135/85 this visit, please have this repeated by your doctor within one month. --------------

## 2015-02-23 NOTE — ED Provider Notes (Signed)
CSN: [id number]     Arrival date & time 02/23/15  1101 History   First MD Initiated Contact with Patient 02/23/15 1204     Chief Complaint  Patient presents with  . Back Pain     (Consider location/radiation/quality/duration/timing/severity/associated sxs/prior Treatment) HPI Comments: Patient with h/o back pain presents with back pain x 5 days. This is her usual back pain but more severe than typical. Pain does not radiate into her legs. Is described as a throbbing pain in her bilateral lower back. It is worse with movement. Patient denies warning symptoms of back pain including: fecal incontinence, urinary retention or overflow incontinence, night sweats, waking from sleep with back pain, unexplained fevers or weight loss, h/o cancer, IVDU, recent trauma.    Also c/o epigastric abdominal 'bloating' and 'cramping' this AM. No fever, N/V/D. She has h/o lap band placed in October. No recent adjustments. Denies overeating recently. Does admit to eating hot sauce last night. No other treatment prior to arrival.  The history is provided by the patient.    Past Medical History  Diagnosis Date  . Sinus problem   . No pertinent past medical history   . Hypertension   . Asthma   . Pneumonia     hx of   . Anxiety   . Depression   . GERD (gastroesophageal reflux disease)    Past Surgical History  Procedure Laterality Date  . Laparoscopic gastric banding  12/26/2007  . Gastric banding port revision  08/24/11  . No past surgeries    . Esophagogastroduodenoscopy N/A 11/26/2014    Procedure: ESOPHAGOGASTRODUODENOSCOPY (EGD);  Surgeon: Ovidio Kin, MD;  Location: [facility];  Service: General;  Laterality: N/A;  . Laparoscopic repair and removal of gastric band N/A 01/06/2015    Procedure: LAPAROSCOPIC RESIGHTING OF GASTRIC Band PORT;  Surgeon: Luretha Murphy, MD;  Location: [facility];  Service: General;  Laterality: N/A;  . Laparoscopic lysis of adhesions N/A 01/06/2015    Procedure:  LAPAROSCOPIC LYSIS OF ADHESIONS;  Surgeon: Luretha Murphy, MD;  Location: [facility];  Service: General;  Laterality: N/A;   Family History  Problem Relation Age of Onset  . Cancer Maternal Uncle     lung  . Cancer Maternal Grandfather     lung and back   Social History  Substance Use Topics  . Smoking status: Never Smoker   . Smokeless tobacco: Never Used  . Alcohol Use: Yes     Comment: socially   OB History    No data available     Review of Systems  Constitutional: Negative for fever and unexpected weight change.  HENT: Negative for rhinorrhea and sore throat.   Eyes: Negative for redness.  Respiratory: Negative for cough.   Cardiovascular: Negative for chest pain.  Gastrointestinal: Positive for abdominal pain. Negative for nausea, vomiting, diarrhea and constipation.       Negative for fecal incontinence.   Genitourinary: Negative for dysuria, hematuria, flank pain, vaginal bleeding, vaginal discharge and pelvic pain.       Negative for urinary incontinence or retention.  Musculoskeletal: Positive for back pain. Negative for myalgias.  Skin: Negative for rash.  Neurological: Negative for weakness, numbness and headaches.       Denies saddle paresthesias.      Allergies  Shellfish allergy  Home Medications   Prior to Admission medications   Medication Sig Start Date End Date Taking? Authorizing Provider  ALPRAZolam Prudy Feeler) 1 MG tablet Take 1 mg by mouth 3 (three) times daily  as needed for anxiety.   Yes Historical Provider, MD  cetirizine (ZYRTEC) 10 MG tablet Take 10 mg by mouth daily with breakfast.   Yes Historical Provider, MD  fluticasone (FLONASE) 50 MCG/ACT nasal spray Place 1 spray into both nostrils every morning.   Yes Historical Provider, MD  tiZANidine (ZANAFLEX) 2 MG tablet Take 2 mg by mouth every 6 (six) hours as needed for muscle spasms.   Yes Historical Provider, MD  topiramate (TOPAMAX) 25 MG capsule Take 25 mg by mouth as needed.   Yes Historical  Provider, MD  albuterol (PROAIR HFA) 108 (90 BASE) MCG/ACT inhaler Inhale 2 puffs into the lungs every 6 (six) hours as needed for wheezing or shortness of breath.    Historical Provider, MD  ALPRAZolam Prudy Feeler) 0.5 MG tablet Take 1 tablet (0.5 mg total) by mouth at bedtime as needed for sleep. Patient not taking: Reported on 12/23/2014 11/01/12   Luretha Murphy, MD  amLODipine (NORVASC) 5 MG tablet Take 5 mg by mouth every morning.    Historical Provider, MD  buPROPion (WELLBUTRIN SR) 150 MG 12 hr tablet Take 150 mg by mouth every morning.    Historical Provider, MD  busPIRone (BUSPAR) 10 MG tablet Take 10 mg by mouth 2 (two) times daily.    Historical Provider, MD  lisinopril (PRINIVIL,ZESTRIL) 10 MG tablet Take 10 mg by mouth every morning.    Historical Provider, MD  montelukast (SINGULAIR) 10 MG tablet Take 1 tablet (10 mg total) by mouth at bedtime. NEEDS OFFICE VISIT/RECHECK Patient not taking: Reported on 12/23/2014 08/13/11   Raymon Mutton Dunn, PA-C  omeprazole (PRILOSEC) 40 MG capsule Take 40 mg by mouth every morning.    Historical Provider, MD  ondansetron (ZOFRAN) 4 MG tablet Take 4 mg by mouth every 4 (four) hours as needed for nausea or vomiting.    Historical Provider, MD  oxyCODONE (ROXICODONE) 5 MG/5ML solution Take 5 mLs (5 mg total) by mouth every 4 (four) hours as needed for moderate pain or severe pain. 01/07/15   Luretha Murphy, MD  pantoprazole (PROTONIX) 40 MG tablet Take 1 tablet (40 mg total) by mouth daily. 11/26/14   Ovidio Kin, MD  ranitidine (ZANTAC) 300 MG tablet Take 300 mg by mouth every morning.    Historical Provider, MD  sertraline (ZOLOFT) 50 MG tablet Take 1/2 tab (25 mg) PO QD for 8 days then take 1 tab (50 mg) PO QD Patient not taking: Reported on 12/23/2014 03/03/12   Luretha Murphy, MD   BP 137/77 mmHg  Pulse 59  Temp(Src) 98.7 F (37.1 C) (Oral)  Resp 18  Ht  (1.575 m)  Wt 117.935 kg  BMI 47.54 kg/m2  SpO2 99%  LMP 02/09/2015 (Approximate) Physical Exam   Constitutional: She appears well-developed and well-nourished.  HENT:  Head: Normocephalic and atraumatic.  Mouth/Throat: Oropharynx is clear and moist.  Eyes: Conjunctivae are normal. Right eye exhibits no discharge. Left eye exhibits no discharge.  Neck: Normal range of motion. Neck supple.  Cardiovascular: Normal rate, regular rhythm and normal heart sounds.   Pulmonary/Chest: Effort normal and breath sounds normal. No respiratory distress. She has no wheezes. She has no rales.  Abdominal: Soft. Bowel sounds are normal. She exhibits no distension. There is tenderness (mild, epigastric). There is no rebound, no guarding and no CVA tenderness.  Musculoskeletal: Normal range of motion.       Cervical back: She exhibits normal range of motion, no tenderness and no bony tenderness.  Thoracic back: She exhibits normal range of motion, no tenderness and no bony tenderness.       Lumbar back: She exhibits tenderness. She exhibits normal range of motion and no bony tenderness.       Back:  No step-off noted with palpation of spine.   Neurological: She is alert. She has normal strength and normal reflexes. No sensory deficit.  5/5 strength in entire lower extremities bilaterally. No sensation deficit.   Skin: Skin is warm and dry. No rash noted.  Psychiatric: She has a normal mood and affect.  Nursing note and vitals reviewed.   ED Course  Procedures (including critical care time) Labs Review Labs Reviewed - No data to display  Imaging Review No results found. I have personally reviewed and evaluated these images and lab results as part of my medical decision-making.   EKG Interpretation None       12:31 PM Patient seen and examined. Exam is reassuring. Will treat symptom. Work-up initiated. Medications ordered.   Vital signs reviewed and are as follows: BP 137/77 mmHg  Pulse 59  Temp(Src) 98.7 F (37.1 C) (Oral)  Resp 18  Ht 5\' 2"  (1.575 m)  Wt 117.935 kg  BMI 47.54  kg/m2  SpO2 99%  LMP 02/09/2015 (Approximate)  1:42 PM patient had resolution of her abdominal pain with GI cocktail. Urged avoidance of spicy foods. Instructed on use of antacids and H2 blocker.  Pain slightly improved after Toradol. Will give muscle relaxer and pain medication for home. Patient encouraged PCP follow-up.  No red flag s/s of low back pain. Patient was counseled on back pain precautions and told to do activity as tolerated but do not lift, push, or pull heavy objects more than 10 pounds for the next week.  Patient counseled to use ice or heat on back for no longer than 15 minutes every hour.   Patient prescribed muscle relaxer and counseled on proper use of muscle relaxant medication.    Patient prescribed narcotic pain medicine and counseled on proper use of narcotic pain medications. Counseled not to combine this medication with others containing tylenol.   Urged patient not to drink alcohol, drive, or perform any other activities that requires focus while taking either of these medications.  Patient urged to follow-up with PCP if pain does not improve with treatment and rest or if pain becomes recurrent. Urged to return with worsening severe pain, loss of bowel or bladder control, trouble walking.   The patient was urged to return to the Emergency Department immediately with worsening of current symptoms, worsening abdominal pain, persistent vomiting, blood noted in stools, fever, or any other concerns.   The patient verbalizes understanding and agrees with the plan.    MDM   Final diagnoses:  Bilateral low back pain without sciatica  Gastritis   Back pain: Patient with back pain. No neurological deficits. Patient is ambulatory. No warning symptoms of back pain including: fecal incontinence, urinary retention or overflow incontinence, night sweats, waking from sleep with back pain, unexplained fevers or weight loss, h/o cancer, IVDU, recent trauma. No concern for  cauda equina, epidural abscess, or other serious cause of back pain. Conservative measures such as rest, ice/heat and pain medicine indicated with PCP follow-up if no improvement with conservative management.   Gastritis: Epigastric abdominal pain after eating hot sauce. Symptoms resolved with GI cocktail. Exam is reassuring with any minimal tenderness on exam. No peritonitis. Do not suspect bowel obstruction or complication from lap band given  her symptoms. Do not feel that advanced imaging is given with this exam and release an EGD. Patient encouraged to return with worsening symptoms.     Renne Crigler, PA-C 02/23/15 1344  Doug Sou, MD 02/23/15 [phone]

## 2015-02-23 NOTE — ED Notes (Signed)
Pt reports generalized back pain, worse since Monday. Pt reports she drove 5 hours and slept on a couch prior to pain starting. Has hx of back pain and physical therapy for same

## 2015-02-24 ENCOUNTER — Ambulatory Visit: Payer: Self-pay | Admitting: Dietician

## 2015-03-12 ENCOUNTER — Ambulatory Visit: Payer: Self-pay | Admitting: Dietician

## 2015-06-02 ENCOUNTER — Other Ambulatory Visit: Payer: Self-pay

## 2015-06-03 ENCOUNTER — Other Ambulatory Visit: Payer: Self-pay

## 2015-06-03 MED ORDER — OMEPRAZOLE 40 MG PO CPDR: 40.0000 mg | DELAYED_RELEASE_CAPSULE | Freq: Every morning | ORAL | Status: DC

## 2015-06-03 MED ORDER — RANITIDINE HCL 300 MG PO TABS: 300.0000 mg | ORAL_TABLET | Freq: Every morning | ORAL | Status: DC

## 2015-09-22 DIAGNOSIS — G8929 Other chronic pain: Secondary | ICD-10-CM | POA: Insufficient documentation

## 2015-09-22 DIAGNOSIS — M7918 Myalgia, other site: Secondary | ICD-10-CM | POA: Insufficient documentation

## 2015-09-22 DIAGNOSIS — M545 Low back pain, unspecified: Secondary | ICD-10-CM | POA: Insufficient documentation

## 2016-02-27 LAB — HM PAP SMEAR: HM PAP: NORMAL

## 2016-06-11 ENCOUNTER — Ambulatory Visit (HOSPITAL_COMMUNITY)
Admission: EM | Admit: 2016-06-11 | Discharge: 2016-06-11 | Disposition: A | Discharge status: Home / Self Care | Payer: Self-pay | Attending: Emergency Medicine | Admitting: Emergency Medicine

## 2016-06-11 ENCOUNTER — Encounter (HOSPITAL_COMMUNITY): Payer: Self-pay | Admitting: Emergency Medicine

## 2016-06-11 DIAGNOSIS — J45901 Unspecified asthma with (acute) exacerbation: Secondary | ICD-10-CM

## 2016-06-11 MED ORDER — PREDNISONE 50 MG PO TABS: 50.0000 mg | ORAL_TABLET | Freq: Every day | ORAL | 0 refills | Status: DC

## 2016-06-11 MED ORDER — IPRATROPIUM BROMIDE 0.06 % NA SOLN: 2.0000 | Freq: Four times a day (QID) | NASAL | 0 refills | Status: DC

## 2016-06-11 MED ORDER — ALBUTEROL SULFATE (2.5 MG/3ML) 0.083% IN NEBU: 2.5000 mg | INHALATION_SOLUTION | RESPIRATORY_TRACT | 0 refills | Status: DC | PRN

## 2016-06-11 NOTE — Discharge Instructions (Signed)
Take two puffs from your albuterol inhaler every 4 hours. Finish the steroids unless your doctor tells you to stop.  Try the Atrovent nasal spray and start some saline nasal irrigation with a Lloyd Huger med sinus rinse. Should help with the postnasal drip. Do a peak flow, once the morning and once at night. Write this down. The number should be going up, not down. You may decrease the frequency of your albuterol inhaler as the numbers go up and you start feeling better. You may take tylenol 1 gram up to 4 times a day as needed for pain. This with 600 mg of motrin is an effective combination for pain and fever. Return to the ER if you get worse, have a fever >100.4, or any other concerns.   Go to www.goodrx.com to look up your medications. This will give you a list of where you can find your prescriptions at the most affordable prices.

## 2016-06-11 NOTE — ED Triage Notes (Signed)
Here for asthma flare up onset 1 week associated w/SOB, chest/nasal congestion, prod cough   Denies fevers  Taking benadryl, rescue inhalers w/no relief.   A&O x4... NAD

## 2016-06-11 NOTE — ED Provider Notes (Signed)
HPI  SUBJECTIVE:  Samantha Romero is a 41 y.o. female who presents with 1 week of an asthma exacerbation secondary to allergies. She reports nasal congestion, postnasal drip, chest congestion, chest tightness, wheezing, shortness of breath or shortness of breath with exertion. States she can't sleep at night secondary to the cough and postnasal drip. She states the cough is productive of whitish phlegm. She has tried Flonase, Benadryl, Zyrtec and is requiring her albuterol inhaler 5 or more times a day. She states normally she uses it only as needed. She states that she has a spacer but is not using it. Symptoms are better with albuterol, Benadryl, worse with known pulmonary irritants. She denies chest pain, nausea, vomiting, fevers. No hemoptysis, calf pain, swelling, trauma, surgery in the past 4 weeks. She is not on any exogenous estrogen. No Unintentional weight gain, orthopnea, PND, nocturia. She states that this feels identical to previous asthma exacerbations. No recent steroids or intubations. She has a past medical history of pneumonia and GERD. She is not a smoker. No history of hypertension, PE, DVT, CHF. LMP: 3 weeks ago. Denies the possibility of being pregnant. PMD: Dr. Randa Evens.    Past Medical History:  Diagnosis Date  . Anxiety   . Asthma   . Depression   . GERD (gastroesophageal reflux disease)   . Hypertension   . No pertinent past medical history   . Pneumonia    hx of   . Sinus problem     Past Surgical History:  Procedure Laterality Date  . ESOPHAGOGASTRODUODENOSCOPY N/A 11/26/2014   Procedure: ESOPHAGOGASTRODUODENOSCOPY (EGD);  Surgeon: Ovidio Kin, MD;  Location: [facility];  Service: General;  Laterality: N/A;  . GASTRIC BANDING PORT REVISION  08/24/11  . LAPAROSCOPIC GASTRIC BANDING  12/26/2007  . LAPAROSCOPIC LYSIS OF ADHESIONS N/A 01/06/2015   Procedure: LAPAROSCOPIC LYSIS OF ADHESIONS;  Surgeon: Luretha Murphy, MD;  Location: [facility];  Service: General;   Laterality: N/A;  . LAPAROSCOPIC REPAIR AND REMOVAL OF GASTRIC BAND N/A 01/06/2015   Procedure: LAPAROSCOPIC RESIGHTING OF GASTRIC Band PORT;  Surgeon: Luretha Murphy, MD;  Location: [facility];  Service: General;  Laterality: N/A;  . NO PAST SURGERIES      Family History  Problem Relation Age of Onset  . Cancer Maternal Uncle     lung  . Cancer Maternal Grandfather     lung and back    Social History  Substance Use Topics  . Smoking status: Never Smoker  . Smokeless tobacco: Never Used  . Alcohol use Yes     Comment: socially    No current facility-administered medications for this encounter.   Current Outpatient Prescriptions:  .  albuterol (PROAIR HFA) 108 (90 BASE) MCG/ACT inhaler, Inhale 2 puffs into the lungs every 6 (six) hours as needed for wheezing or shortness of breath., Disp: , Rfl:  .  amLODipine (NORVASC) 5 MG tablet, Take 5 mg by mouth every morning., Disp: , Rfl:  .  buPROPion (WELLBUTRIN SR) 150 MG 12 hr tablet, Take 150 mg by mouth every morning., Disp: , Rfl:  .  busPIRone (BUSPAR) 10 MG tablet, Take 10 mg by mouth 2 (two) times daily., Disp: , Rfl:  .  cetirizine (ZYRTEC) 10 MG tablet, Take 10 mg by mouth daily with breakfast., Disp: , Rfl:  .  famotidine (PEPCID) 20 MG tablet, Take 1 tablet (20 mg total) by mouth 2 (two) times daily., Disp: 15 tablet, Rfl: 0 .  fluticasone (FLONASE) 50 MCG/ACT nasal spray, Place  1 spray into both nostrils every morning., Disp: , Rfl:  .  lisinopril (PRINIVIL,ZESTRIL) 10 MG tablet, Take 10 mg by mouth every morning., Disp: , Rfl:  .  methocarbamol (ROBAXIN) 500 MG tablet, Take 2 tablets (1,000 mg total) by mouth 4 (four) times daily., Disp: 20 tablet, Rfl: 0 .  montelukast (SINGULAIR) 10 MG tablet, Take 1 tablet (10 mg total) by mouth at bedtime. NEEDS OFFICE VISIT/RECHECK, Disp: 30 tablet, Rfl: 0 .  omeprazole (PRILOSEC) 40 MG capsule, Take 1 capsule (40 mg total) by mouth every morning., Disp: 30 capsule, Rfl: 5 .  ondansetron  (ZOFRAN) 4 MG tablet, Take 4 mg by mouth every 4 (four) hours as needed for nausea or vomiting., Disp: , Rfl:  .  pantoprazole (PROTONIX) 40 MG tablet, Take 1 tablet (40 mg total) by mouth daily., Disp: 30 tablet, Rfl: 3 .  ranitidine (ZANTAC) 300 MG tablet, Take 1 tablet (300 mg total) by mouth every morning., Disp: 30 tablet, Rfl: 5 .  sertraline (ZOLOFT) 50 MG tablet, Take 1/2 tab (25 mg) PO QD for 8 days then take 1 tab (50 mg) PO QD, Disp: 40 tablet, Rfl: 1 .  tiZANidine (ZANAFLEX) 2 MG tablet, Take 2 mg by mouth every 6 (six) hours as needed for muscle spasms., Disp: , Rfl:  .  topiramate (TOPAMAX) 25 MG capsule, Take 25 mg by mouth as needed., Disp: , Rfl:  .  albuterol (PROVENTIL) (2.5 MG/3ML) 0.083% nebulizer solution, Take 3 mLs (2.5 mg total) by nebulization every 4 (four) hours as needed for wheezing or shortness of breath., Disp: 75 mL, Rfl: 0 .  ALPRAZolam (XANAX) 0.5 MG tablet, Take 1 tablet (0.5 mg total) by mouth at bedtime as needed for sleep. (Patient not taking: Reported on 12/23/2014), Disp: 30 tablet, Rfl: 1 .  ALPRAZolam (XANAX) 1 MG tablet, Take 1 mg by mouth 3 (three) times daily as needed for anxiety., Disp: , Rfl:  .  HYDROcodone-acetaminophen (NORCO/VICODIN) 5-325 MG tablet, Take 1-2 tablets every 6 hours as needed for severe pain, Disp: 8 tablet, Rfl: 0 .  ipratropium (ATROVENT) 0.06 % nasal spray, Place 2 sprays into both nostrils 4 (four) times daily. 3-4 times/ day, Disp: 15 mL, Rfl: 0 .  predniSONE (DELTASONE) 50 MG tablet, Take 1 tablet (50 mg total) by mouth daily with breakfast., Disp: 5 tablet, Rfl: 0  Allergies  Allergen Reactions  . Shellfish Allergy Itching    In throat only.     ROS  As noted in HPI.   Physical Exam  BP (!) 147/98 (BP Location: Left Arm)   Pulse 91   Temp 98.5 F (36.9 C) (Oral)   Resp 20   LMP 05/21/2016   SpO2 99%   Constitutional: Well developed, well nourished, no acute distress Eyes:  EOMI, conjunctiva normal  bilaterally HENT: Normocephalic, atraumatic,mucus membranes moist. positive clear rhinorrhea, erythematous but not swollen turbinates. Normal oropharynx. + extensive Postnasal drip Respiratory: Normal inspiratory effort, diffuse wheezing throughout. No chest wall tenderness Cardiovascular: Normal rate regular rhythm no murmurs rubs or gallops GI: nondistended skin: No rash, skin intact Musculoskeletal: Calves symmetric, nontender no edema no deformities Neurologic: Alert & oriented x 3, no focal neuro deficits Psychiatric: Speech and behavior appropriate   ED Course   Medications - No data to display  No orders of the defined types were placed in this encounter.   No results found for this or any previous visit (from the past 24 hour(s)). No results found.  ED Clinical  Impression  Exacerbation of asthma, unspecified asthma severity, unspecified whether persistent   ED Assessment/Plan  Doubt PE. Patient meets  PERC rule. Presentation most consistent with an asthma exacerbation due to a known trigger. We will refill her albuterol bullets. She states that she does not need another rescue inhaler or spacer. We'll do 5 days of prednisone 50 mg and try some saline nasal irrigation with some Atrovent nasal spray. She has a peak flow meter at home. Instructed patient to start using it. She'll follow-up with her primary care physician as needed, to the ER if she gets worse.  Discussed MDM, plan and followup with patient. Discussed sn/sx that should prompt return to the ED. Patient agrees with plan.   Meds ordered this encounter  Medications  . predniSONE (DELTASONE) 50 MG tablet    Sig: Take 1 tablet (50 mg total) by mouth daily with breakfast.    Dispense:  5 tablet    Refill:  0  . albuterol (PROVENTIL) (2.5 MG/3ML) 0.083% nebulizer solution    Sig: Take 3 mLs (2.5 mg total) by nebulization every 4 (four) hours as needed for wheezing or shortness of breath.    Dispense:  75 mL     Refill:  0  . ipratropium (ATROVENT) 0.06 % nasal spray    Sig: Place 2 sprays into both nostrils 4 (four) times daily. 3-4 times/ day    Dispense:  15 mL    Refill:  0    *This clinic note was created using Scientist, clinical (histocompatibility and immunogenetics). Therefore, there may be occasional mistakes despite careful proofreading.  ?   Domenick Gong, MD 06/11/16 1757

## 2016-07-30 MED FILL — HYDROCODONE-CHLORPHENIRAM S: 10-8 | 10 days supply | Qty: 100 | Fill #0

## 2016-08-16 ENCOUNTER — Other Ambulatory Visit (HOSPITAL_COMMUNITY): Payer: Self-pay | Admitting: Surgery

## 2016-08-16 DIAGNOSIS — R1310 Dysphagia, unspecified: Secondary | ICD-10-CM

## 2016-08-17 ENCOUNTER — Ambulatory Visit (HOSPITAL_COMMUNITY)
Admission: RE | Admit: 2016-08-17 | Discharge: 2016-08-17 | Disposition: A | Discharge status: Home / Self Care | Payer: BLUE CROSS/BLUE SHIELD | Source: Ambulatory Visit | Attending: Surgery | Admitting: Surgery

## 2016-08-17 ENCOUNTER — Other Ambulatory Visit (HOSPITAL_COMMUNITY): Payer: Self-pay | Admitting: Surgery

## 2016-08-17 DIAGNOSIS — Z9884 Bariatric surgery status: Secondary | ICD-10-CM | POA: Diagnosis not present

## 2016-08-17 DIAGNOSIS — R1310 Dysphagia, unspecified: Secondary | ICD-10-CM | POA: Insufficient documentation

## 2016-08-17 DIAGNOSIS — K449 Diaphragmatic hernia without obstruction or gangrene: Secondary | ICD-10-CM | POA: Insufficient documentation

## 2016-10-11 NOTE — Progress Notes (Deleted)
[facility] at [facility] [address], [address] [place], [place] [address] [phone]  Date:  10/13/2016   Name:  Samantha Romero   DOB:  01-21-1976   MRN:  [id number]  PCP:  Filomena Jungling, NP    Chief Complaint: No chief complaint on file.   History of Present Illness:  Samantha Romero is a 41 y.o. very pleasant female patient who presents with the following:  Here today as a new patient to establish care History of fibromyalgia- see visit from rheumatology, Dr. Allena Napoleon last year: Samantha Romero is a 41 y.o. African American female who presents a constellation of symptoms most prominent of which are chronic fatigue, insomnia, subjective memory impairment and multi-site body pain (low back pain being the most bothersome). The clinical picture is consistent with fibromyalgia that is symptomatic enough to affect social, recreational and physical functioning. Based on PHQ8 score of 24, the patient may also have coexisting (possibly clinically significant) depression     Wt Readings from Last 3 Encounters:  02/23/15 260 lb (117.9 kg)  02/12/15 265 lb (120.2 kg)  01/06/15 277 lb 12.8 oz (126 kg)     Patient Active Problem List   Diagnosis Date Noted  . Status post gastric banding 01/06/2015  . Depression, acute 03/01/2012  . Asthma attack 09/27/2011  . Lapband port leaking-REVISED May 2013 09/10/2011  . 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 Sinus problem     Past Medical History:  Diagnosis Date  . Anxiety   . Asthma   . Depression   . GERD (gastroesophageal reflux disease)   . Hypertension   . No pertinent past medical history   . Pneumonia    hx of   . Sinus problem     Past Surgical History:  Procedure Laterality Date  . ESOPHAGOGASTRODUODENOSCOPY N/A 11/26/2014   Procedure: ESOPHAGOGASTRODUODENOSCOPY (EGD);  Surgeon: Ovidio Kin, MD;  Location: [facility];  Service: General;  Laterality: N/A;  . GASTRIC BANDING PORT REVISION   08/24/11  . LAPAROSCOPIC GASTRIC BANDING  12/26/2007  . LAPAROSCOPIC LYSIS OF ADHESIONS N/A 01/06/2015   Procedure: LAPAROSCOPIC LYSIS OF ADHESIONS;  Surgeon: Luretha Murphy, MD;  Location: [facility];  Service: General;  Laterality: N/A;  . LAPAROSCOPIC REPAIR AND REMOVAL OF GASTRIC BAND N/A 01/06/2015   Procedure: LAPAROSCOPIC RESIGHTING OF GASTRIC Band PORT;  Surgeon: Luretha Murphy, MD;  Location: [facility];  Service: General;  Laterality: N/A;  . NO PAST SURGERIES      Social History  Substance Use Topics  . Smoking status: Never Smoker  . Smokeless tobacco: Never Used  . Alcohol use Yes     Comment: socially    Family History  Problem Relation Age of Onset  . Cancer Maternal Uncle        lung  . Cancer Maternal Grandfather        lung and back    Allergies  Allergen Reactions  . Shellfish Allergy Itching    In throat only.    Medication list has been reviewed and updated.  Current Outpatient Prescriptions on File Prior to Visit  Medication Sig Dispense Refill  . albuterol (PROAIR HFA) 108 (90 BASE) MCG/ACT inhaler Inhale 2 puffs into the lungs every 6 (six) hours as needed for wheezing or shortness of breath.    Marland Kitchen albuterol (PROVENTIL) (2.5 MG/3ML) 0.083% nebulizer solution Take 3 mLs (2.5 mg total) by nebulization every 4 (four) hours as needed for wheezing  or shortness of breath. 75 mL 0  . ALPRAZolam (XANAX) 0.5 MG tablet Take 1 tablet (0.5 mg total) by mouth at bedtime as needed for sleep. (Patient not taking: Reported on 12/23/2014) 30 tablet 1  . ALPRAZolam (XANAX) 1 MG tablet Take 1 mg by mouth 3 (three) times daily as needed for anxiety.    Marland Kitchen amLODipine (NORVASC) 5 MG tablet Take 5 mg by mouth every morning.    Marland Kitchen buPROPion (WELLBUTRIN SR) 150 MG 12 hr tablet Take 150 mg by mouth every morning.    . busPIRone (BUSPAR) 10 MG tablet Take 10 mg by mouth 2 (two) times daily.    . cetirizine (ZYRTEC) 10 MG tablet Take 10 mg by mouth daily with breakfast.    . famotidine  (PEPCID) 20 MG tablet Take 1 tablet (20 mg total) by mouth 2 (two) times daily. 15 tablet 0  . fluticasone (FLONASE) 50 MCG/ACT nasal spray Place 1 spray into both nostrils every morning.    Marland Kitchen HYDROcodone-acetaminophen (NORCO/VICODIN) 5-325 MG tablet Take 1-2 tablets every 6 hours as needed for severe pain 8 tablet 0  . ipratropium (ATROVENT) 0.06 % nasal spray Place 2 sprays into both nostrils 4 (four) times daily. 3-4 times/ day 15 mL 0  . lisinopril (PRINIVIL,ZESTRIL) 10 MG tablet Take 10 mg by mouth every morning.    . methocarbamol (ROBAXIN) 500 MG tablet Take 2 tablets (1,000 mg total) by mouth 4 (four) times daily. 20 tablet 0  . montelukast (SINGULAIR) 10 MG tablet Take 1 tablet (10 mg total) by mouth at bedtime. NEEDS OFFICE VISIT/RECHECK 30 tablet 0  . omeprazole (PRILOSEC) 40 MG capsule Take 1 capsule (40 mg total) by mouth every morning. 30 capsule 5  . ondansetron (ZOFRAN) 4 MG tablet Take 4 mg by mouth every 4 (four) hours as needed for nausea or vomiting.    . pantoprazole (PROTONIX) 40 MG tablet Take 1 tablet (40 mg total) by mouth daily. 30 tablet 3  . predniSONE (DELTASONE) 50 MG tablet Take 1 tablet (50 mg total) by mouth daily with breakfast. 5 tablet 0  . ranitidine (ZANTAC) 300 MG tablet Take 1 tablet (300 mg total) by mouth every morning. 30 tablet 5  . sertraline (ZOLOFT) 50 MG tablet Take 1/2 tab (25 mg) PO QD for 8 days then take 1 tab (50 mg) PO QD 40 tablet 1  . tiZANidine (ZANAFLEX) 2 MG tablet Take 2 mg by mouth every 6 (six) hours as needed for muscle spasms.    Marland Kitchen topiramate (TOPAMAX) 25 MG capsule Take 25 mg by mouth as needed.     No current facility-administered medications on file prior to visit.     Review of Systems:  As per HPI- otherwise negative.   Physical Examination: There were no vitals filed for this visit. There were no vitals filed for this visit. There is no height or weight on file to calculate BMI. Ideal Body Weight:    GEN: WDWN, NAD,  Non-toxic, A & O x 3 HEENT: Atraumatic, Normocephalic. Neck supple. No masses, No LAD. Ears and Nose: No external deformity. CV: RRR, No M/G/R. No JVD. No thrill. No extra heart sounds. PULM: CTA B, no wheezes, crackles, rhonchi. No retractions. No resp. distress. No accessory muscle use. ABD: S, NT, ND, +BS. No rebound. No HSM. EXTR: No c/c/e NEURO Normal gait.  PSYCH: Normally interactive. Conversant. Not depressed or anxious appearing.  Calm demeanor.    Assessment and Plan: ***  Signed Teja Judice,  MD

## 2016-10-12 ENCOUNTER — Telehealth: Payer: Self-pay

## 2016-10-12 NOTE — Telephone Encounter (Signed)
Pre visit call completed 

## 2016-10-13 ENCOUNTER — Ambulatory Visit: Payer: BLUE CROSS/BLUE SHIELD | Admitting: Family Medicine

## 2016-10-13 ENCOUNTER — Telehealth: Payer: Self-pay | Admitting: Family Medicine

## 2016-10-13 NOTE — Telephone Encounter (Signed)
That is fine. I cannot make my on time patients wait when someone is late and I have a full schedule today.

## 2016-10-13 NOTE — Telephone Encounter (Signed)
Left patient a message to call the office back. 

## 2016-10-13 NOTE — Telephone Encounter (Signed)
Patient returned call

## 2016-10-13 NOTE — Telephone Encounter (Signed)
Pt came in for new pt appt. Scheduled time 10:30. Pt arrived at 10:41. I advised I would need approval to check in since pt was late. Pt states she was new pt and could not find our office. Pt said she called and had been on hold for several minutes. She was in our parking lot but thought it was a hospital. She was told we are in this building and came in. I advised pt I would need to reschedule based on skype with CMA. First available with Dr. Patsy Lager was 11/15/16. Pt said that she did not want to wait that long. I offered to schedule with another provider with sooner availability. Pt said ok. She later changed her mind stating that it was not fair to her stating that we should notify new pts that we are in the [facility] building that looks like a hospital. I apologized to pt. She stated she will find a different provider.

## 2016-11-16 NOTE — Progress Notes (Signed)
[facility] at [facility] [address], [address] [place], [place] [address] [phone]  Date:  11/18/2016   Name:  Samantha Romero   DOB:  1975-08-06   MRN:  [id number]  PCP:  Pearline Cables, MD    Chief Complaint: Establish Care (Pt new to establish care. Pt reports history of fibromyalgia. Has itching under her breasts and in bilateral groin x 1 week. Currently sees therapist for anxiety. Has difficutly falling and staying asleep x 1 year.)   History of Present Illness:  Samantha Romero is a 41 y.o. very pleasant female patient who presents with the following:  Here today as a new patient History of lap band 2009, depression, asthma I have seen this pt a very long time ago at [facility] She does suffer from asthma- she has to avoid her triggers including smoke, mold, animal dander She was in the ER in March of this year with an asthma excerbation Her asthma sx have been pretty quite since then She states dx of FBM last year with chronic pain and fatigue She also notes stress, anxiety and depression  She wants to try and lose weight, but finds that she has too much pain to get much exercise She is working as Copywriter, advertising for a congressional race- this will be over in November  She got her lap band in 2009; she lost about 100 lbs, but gained it back over time She has had to use prednisone on several occasions She had a lab band revision in 2016- then gained the weight back  Her mother did lose a lot of weight recently, and most people in her family are of normal weight  Her father died of an overdose when she was 12 years old so she does not know much about his health  She is taking tizanidine prn for her FBM, and xanax.  She will generally take 2 a day She is also using flovent as a controlled med for her asthma She is not taking Wellbutrin right now- however she did seem to do well with this medication and would like to go back on  it  They did check her thyroid a few months ago, but not all that recently   She is still seeing Dr. Daphine Deutscher who did her lap band; they are considering tightening her band again, and might try putting her back on her old diet again.    She has tried zolft- did not help much.  Cannot recall trying any other SSRI/ SNRI  Patient Active Problem List   Diagnosis Date Noted  . Depression, acute 03/01/2012  . Asthma attack 09/27/2011  . Lapband port leaking-REVISED May 2013 09/10/2011  . 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 Anxiety   . Asthma   . Depression   . GERD (gastroesophageal reflux disease)   . Hypertension   . No pertinent past medical history   . Pneumonia    hx of   . Sinus problem     Past Surgical History:  Procedure Laterality Date  . ESOPHAGOGASTRODUODENOSCOPY N/A 11/26/2014   Procedure: ESOPHAGOGASTRODUODENOSCOPY (EGD);  Surgeon: Ovidio Kin, MD;  Location: [facility];  Service: General;  Laterality: N/A;  . GASTRIC BANDING PORT REVISION  08/24/11  . LAPAROSCOPIC GASTRIC BANDING  12/26/2007  . LAPAROSCOPIC LYSIS OF ADHESIONS N/A 01/06/2015   Procedure: LAPAROSCOPIC LYSIS OF ADHESIONS;  Surgeon: Luretha Murphy, MD;  Location: [facility];  Service: General;  Laterality: N/A;  . LAPAROSCOPIC REPAIR AND REMOVAL OF GASTRIC BAND N/A 01/06/2015   Procedure: LAPAROSCOPIC RESIGHTING OF GASTRIC Band PORT;  Surgeon: Luretha Murphy, MD;  Location: [facility];  Service: General;  Laterality: N/A;  . NO PAST SURGERIES      Social History  Substance Use Topics  . Smoking status: Never Smoker  . Smokeless tobacco: Never Used  . Alcohol use Yes     Comment: socially    Family History  Problem Relation Age of Onset  . Cancer Maternal Uncle        lung  . Cancer Maternal Grandfather        lung and back    Allergies  Allergen Reactions  . Shellfish Allergy Itching    In throat only.    Medication list has been reviewed and  updated.  Current Outpatient Prescriptions on File Prior to Visit  Medication Sig Dispense Refill  . albuterol (PROAIR HFA) 108 (90 BASE) MCG/ACT inhaler Inhale 2 puffs into the lungs every 6 (six) hours as needed for wheezing or shortness of breath.    Marland Kitchen albuterol (PROVENTIL) (2.5 MG/3ML) 0.083% nebulizer solution Take 3 mLs (2.5 mg total) by nebulization every 4 (four) hours as needed for wheezing or shortness of breath. 75 mL 0  . ALPRAZolam (XANAX) 0.5 MG tablet Take 1 tablet (0.5 mg total) by mouth at bedtime as needed for sleep. 30 tablet 1  . ALPRAZolam (XANAX) 1 MG tablet Take 1 mg by mouth 3 (three) times daily as needed for anxiety.    Marland Kitchen buPROPion (WELLBUTRIN SR) 150 MG 12 hr tablet Take 150 mg by mouth every morning.    . cetirizine (ZYRTEC) 10 MG tablet Take 10 mg by mouth daily with breakfast.    . famotidine (PEPCID) 20 MG tablet Take 1 tablet (20 mg total) by mouth 2 (two) times daily. 15 tablet 0  . fluticasone (FLONASE) 50 MCG/ACT nasal spray Place 1 spray into both nostrils every morning.    Marland Kitchen ipratropium (ATROVENT) 0.06 % nasal spray Place 2 sprays into both nostrils 4 (four) times daily. 3-4 times/ day 15 mL 0  . omeprazole (PRILOSEC) 40 MG capsule Take 1 capsule (40 mg total) by mouth every morning. 30 capsule 5  . ranitidine (ZANTAC) 300 MG tablet Take 1 tablet (300 mg total) by mouth every morning. 30 tablet 5  . tiZANidine (ZANAFLEX) 2 MG tablet Take 2 mg by mouth every 6 (six) hours as needed for muscle spasms.     No current facility-administered medications on file prior to visit.     Review of Systems:  As per HPI- otherwise negative. No fever or chills No CP or SOB  Physical Examination: Vitals:   11/18/16 1428  BP: (!) 148/92  Pulse: 73  Resp: 16  Temp: 98.3 F (36.8 C)  SpO2: 100%   Vitals:   11/18/16 1428  Weight: 264 lb 6.4 oz (119.9 kg)  Height: 5\' 2"  (1.575 m)   Body mass index is 48.36 kg/m. Ideal Body Weight: Weight in (lb) to have BMI =  25: 136.4  GEN: WDWN, NAD, Non-toxic, A & O x 3, obese, otherwise looks well HEENT: Atraumatic, Normocephalic. Neck supple. No masses, No LAD.  Bilateral TM wnl, oropharynx normal.  PEERL,EOMI.   Ears and Nose: No external deformity. CV: RRR, No M/G/R. No JVD. No thrill. No extra heart sounds. PULM: CTA B, no wheezes, crackles, rhonchi. No retractions. No resp. distress.  No accessory muscle use. ABD: S, NT, ND, +BS. No rebound. No HSM. EXTR: No c/c/e NEURO Normal gait.  PSYCH: Normally interactive. Conversant. Not depressed or anxious appearing.  Calm demeanor.  Candida intertrigo rash under breasts and in groin  Assessment and Plan: Fibromyalgia - Plan: buPROPion (WELLBUTRIN SR) 150 MG 12 hr tablet  Screening for deficiency anemia - Plan: CBC  Screening for diabetes mellitus - Plan: Comprehensive metabolic panel  Weight gain - Plan: TSH  Candidal intertrigo - Plan: fluconazole (DIFLUCAN) 150 MG tablet  Class 3 severe obesity due to excess calories without serious comorbidity with body mass index (BMI) of 45.0 to 49.9 in adult (HCC)  Adjustment reaction with anxiety and depression - Plan: buPROPion (WELLBUTRIN SR) 150 MG 12 hr tablet  Here today to establish care Ordered labs as above Diflucan for candida wellbutrin rx- she will restart this  She plans to discuss her interest in gastric bypass with dr. Daphine Deutscher Will see me in 8 week for a recheck   Signed Abbe Amsterdam, MD

## 2016-11-18 ENCOUNTER — Encounter: Payer: Self-pay | Admitting: Family Medicine

## 2016-11-18 ENCOUNTER — Ambulatory Visit (INDEPENDENT_AMBULATORY_CARE_PROVIDER_SITE_OTHER): Payer: BLUE CROSS/BLUE SHIELD | Admitting: Family Medicine

## 2016-11-18 VITALS — BP 148/92 | HR 73 | Temp 98.3°F | Resp 16 | Ht 62.0 in | Wt 264.4 lb

## 2016-11-18 DIAGNOSIS — B372 Candidiasis of skin and nail: Secondary | ICD-10-CM

## 2016-11-18 DIAGNOSIS — R635 Abnormal weight gain: Secondary | ICD-10-CM | POA: Diagnosis not present

## 2016-11-18 DIAGNOSIS — Z6841 Body Mass Index (BMI) 40.0 and over, adult: Secondary | ICD-10-CM | POA: Diagnosis not present

## 2016-11-18 DIAGNOSIS — E66813 Obesity, class 3: Secondary | ICD-10-CM

## 2016-11-18 DIAGNOSIS — F4323 Adjustment disorder with mixed anxiety and depressed mood: Secondary | ICD-10-CM | POA: Insufficient documentation

## 2016-11-18 DIAGNOSIS — M797 Fibromyalgia: Secondary | ICD-10-CM

## 2016-11-18 DIAGNOSIS — Z131 Encounter for screening for diabetes mellitus: Secondary | ICD-10-CM | POA: Diagnosis not present

## 2016-11-18 DIAGNOSIS — Z130 Encounter for screening for diseases of the blood and blood-forming organs and certain disorders involving the immune mechanism: Secondary | ICD-10-CM | POA: Diagnosis not present

## 2016-11-18 MED ORDER — BUPROPION HCL ER (SR) 150 MG PO TB12: 150.0000 mg | ORAL_TABLET | Freq: Every morning | ORAL | 3 refills | Status: DC

## 2016-11-18 MED ORDER — FLUCONAZOLE 150 MG PO TABS: 150.0000 mg | ORAL_TABLET | Freq: Once | ORAL | 0 refills | Status: AC

## 2016-11-18 NOTE — Patient Instructions (Signed)
It was good to see you today!  Take care and I will be in touch with your labs asap Start back on the wellbutrin- once a day for 3 days, then twice a day Use the diflucan for rash- take one, repeat in one week if needed  Please discuss your interest in gastric bypass with Dr. Daphine Deutscher- this may be something that you can pursue together Please come and see me in about 8 weeks to check on how you are doing

## 2016-11-24 ENCOUNTER — Encounter: Payer: Self-pay | Admitting: Family Medicine

## 2017-01-24 IMAGING — CR DG CHEST 2V
2 series · 2 of 2 positions shown · non-contrast
Comparison: 09/27/2011

CLINICAL DATA: Mid chest pain and shortness of breath beginning
today. History of asthma.

EXAM:
CHEST  2 VIEW

[w chest pa]
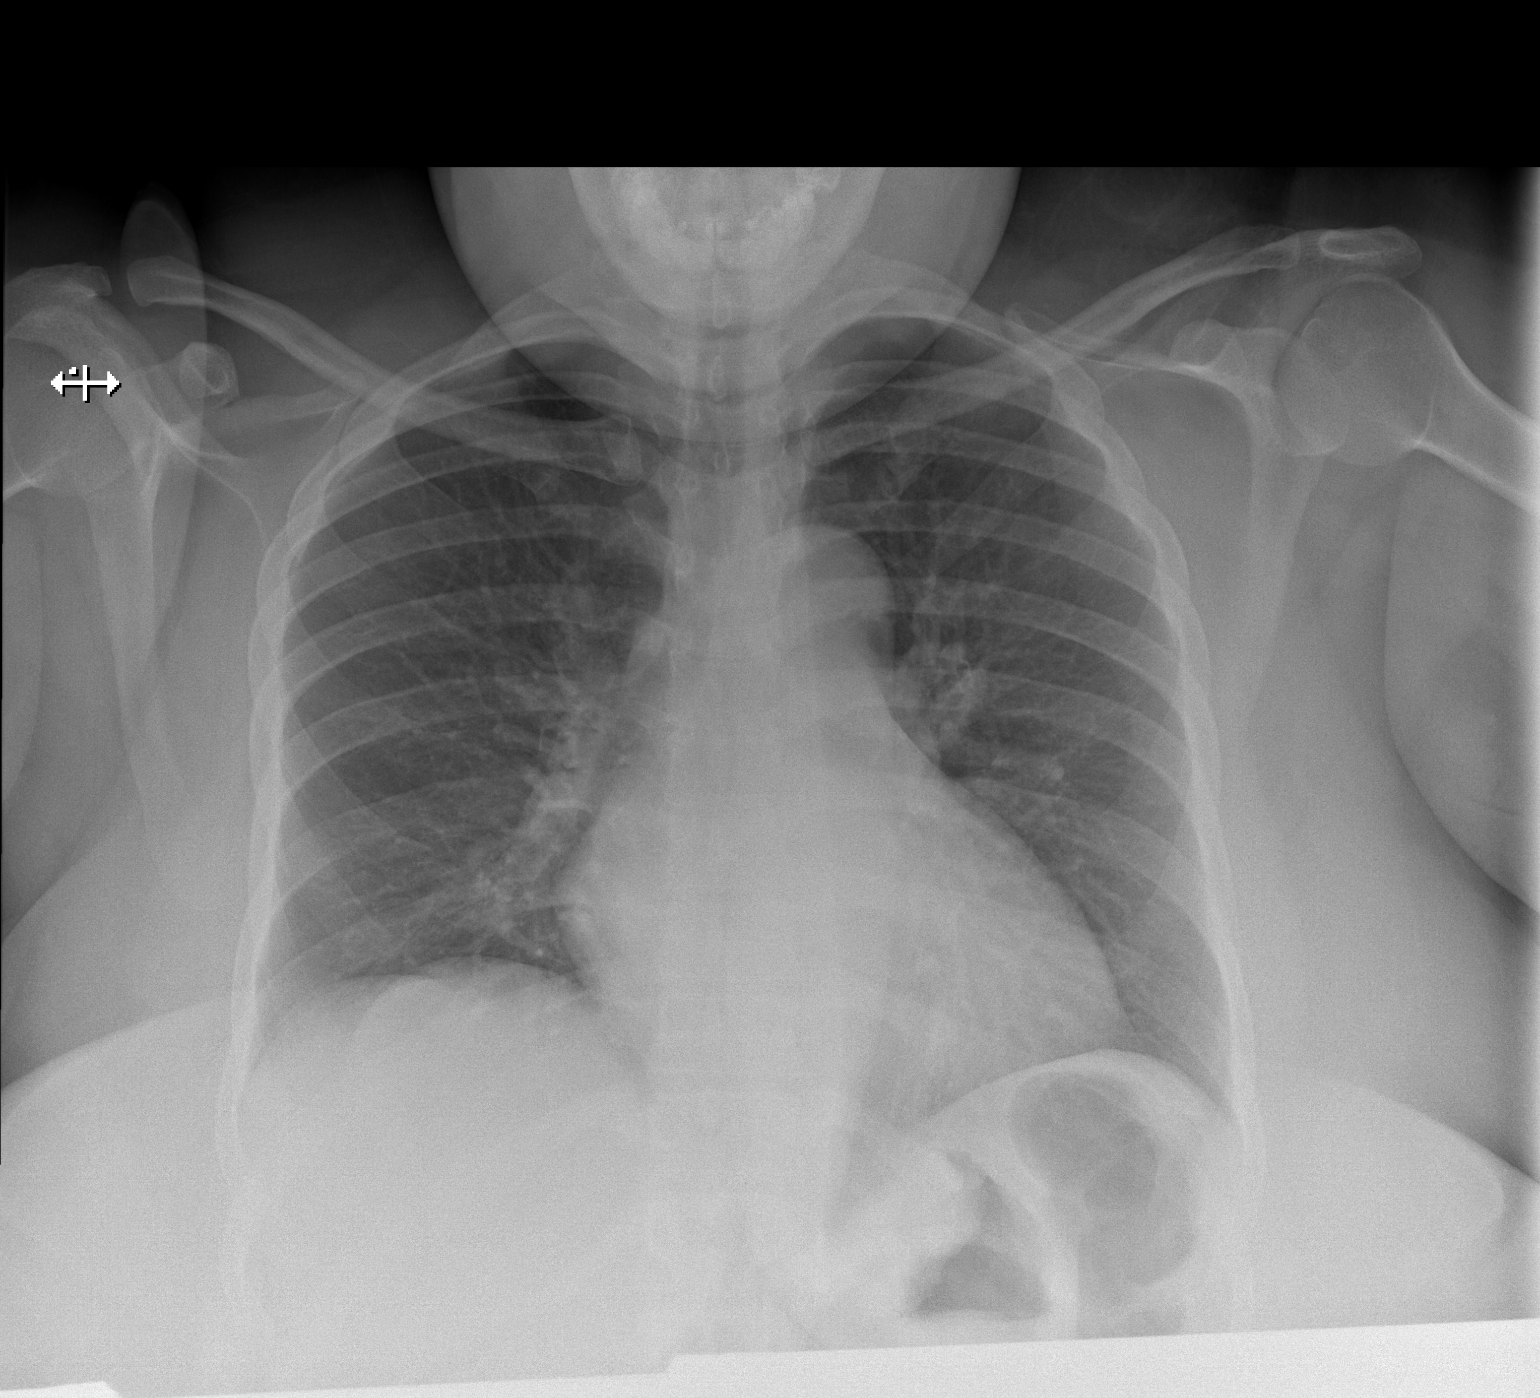

[w chest lat]
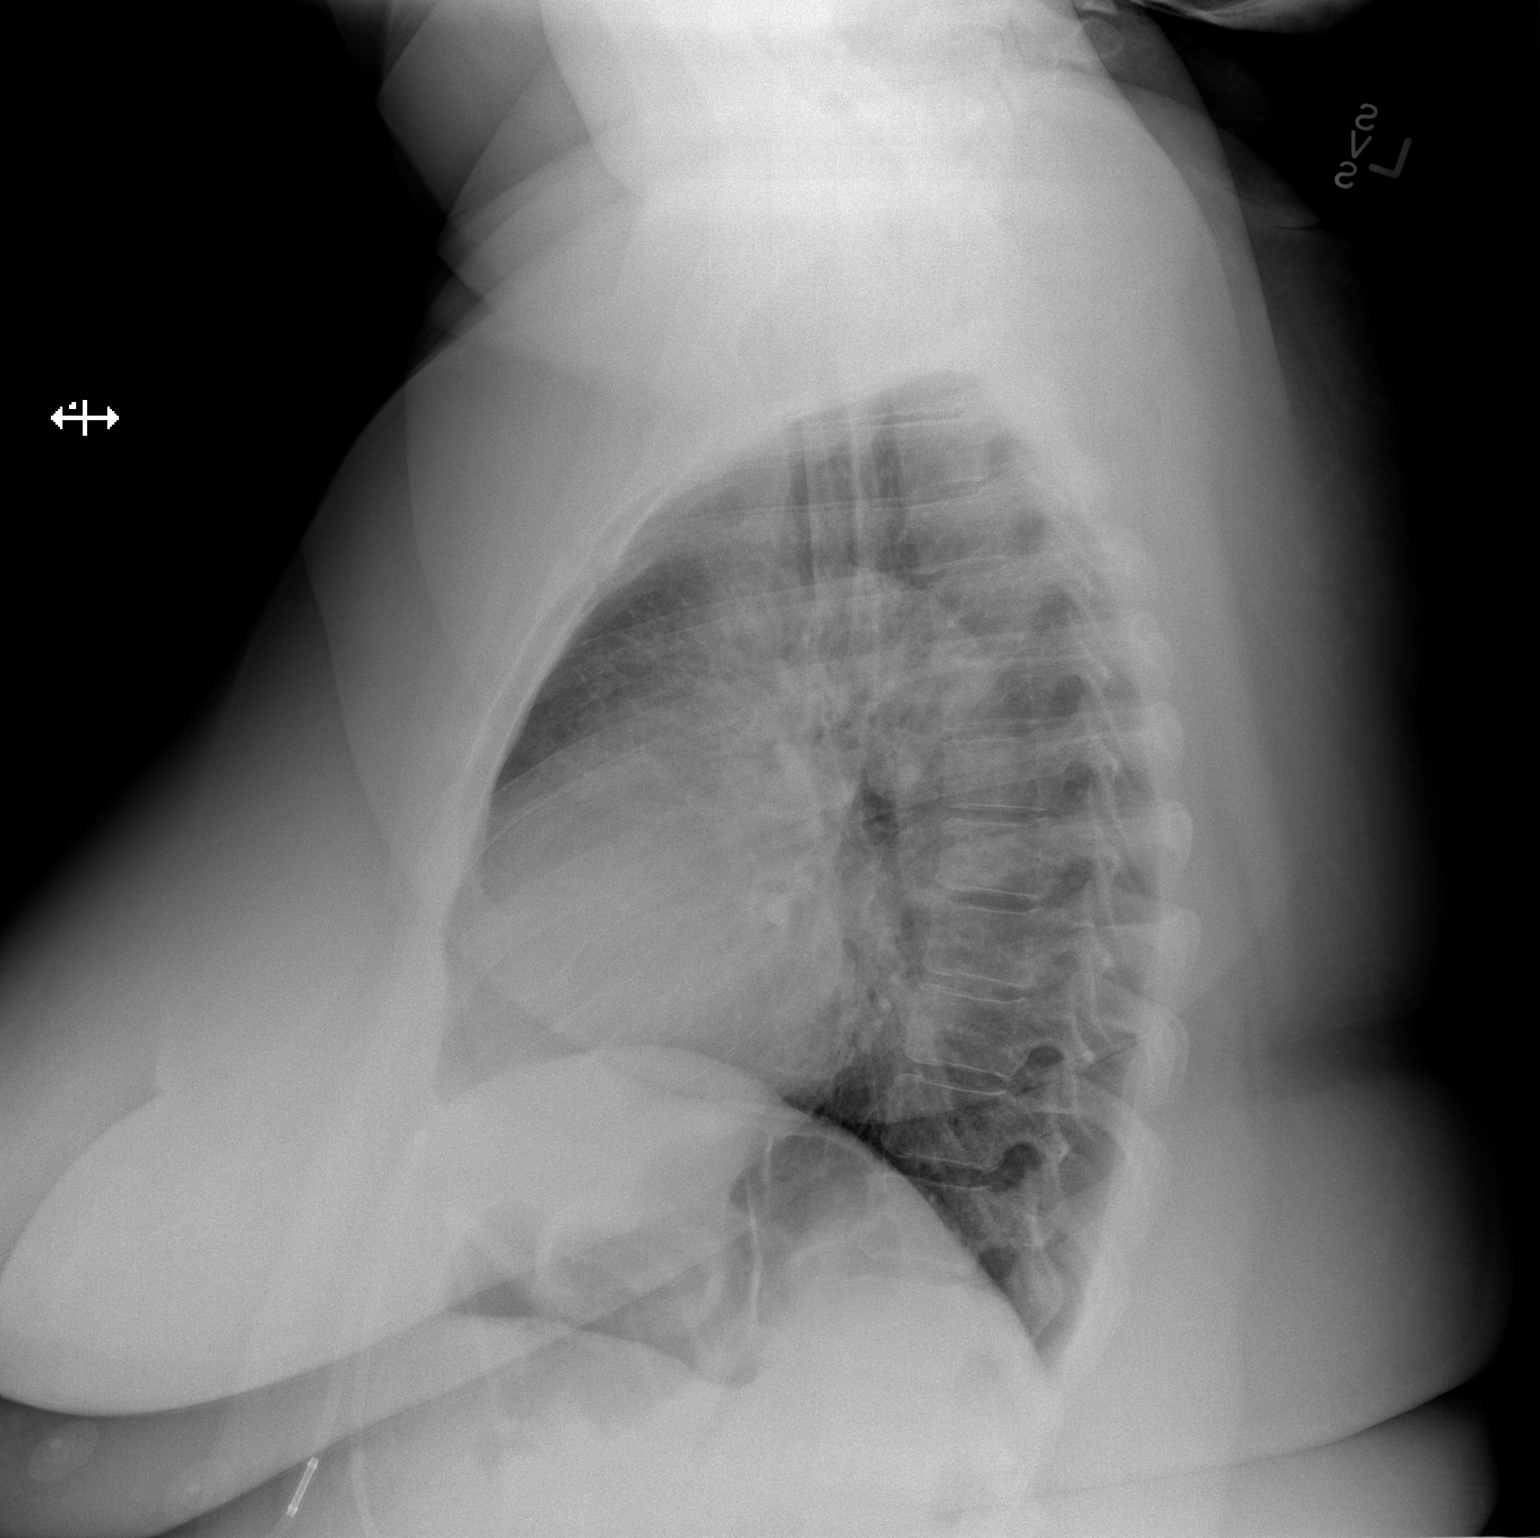

[2 of 2 positions shown; findings below may reference images not displayed]

FINDINGS: Exam demonstrates somewhat low lung volumes without consolidation or
effusion. Cardiomediastinal silhouette and remainder of the exam is
within normal.
IMPRESSION: No active cardiopulmonary disease.

## 2017-07-11 ENCOUNTER — Other Ambulatory Visit: Payer: Self-pay | Admitting: Obstetrics and Gynecology

## 2017-07-11 DIAGNOSIS — R928 Other abnormal and inconclusive findings on diagnostic imaging of breast: Secondary | ICD-10-CM

## 2017-07-12 ENCOUNTER — Other Ambulatory Visit: Payer: Self-pay

## 2017-07-13 ENCOUNTER — Ambulatory Visit
Admission: RE | Admit: 2017-07-13 | Discharge: 2017-07-13 | Disposition: A | Discharge status: Home / Self Care | Payer: BLUE CROSS/BLUE SHIELD | Source: Ambulatory Visit | Attending: Obstetrics and Gynecology | Admitting: Obstetrics and Gynecology

## 2017-07-13 DIAGNOSIS — R928 Other abnormal and inconclusive findings on diagnostic imaging of breast: Secondary | ICD-10-CM

## 2017-11-30 NOTE — Progress Notes (Signed)
Please place orders in epic pt. Has a preop 12-01-17 at 0930 am thank you!

## 2017-11-30 NOTE — Patient Instructions (Addendum)
Crescent TESHAWNA WELT  11/30/2017   Your procedure is scheduled on: 12-09-17  Report to [facility] Main  Entrance  Report to admitting at    0900 AM    Call this number if you have problems the morning of surgery [phone]   Remember: Do not eat food or drink liquids :After Midnight.     Take these medicines the morning of surgery with A SIP OF WATER: inhaler and bring, flonase, wellbutrin, cymbalta, zyrtyec, zanaflex if needed,                                You may not have any metal on your body including hair pins and              piercings  Do not wear jewelry, make-up, lotions, powders or perfumes, deodorant             Do not wear nail polish.  Do not shave  48 hours prior to surgery.     Do not bring valuables to the hospital. [facility] IS NOT             RESPONSIBLE   FOR VALUABLES.  Contacts, dentures or bridgework may not be worn into surgery.      Patients discharged the day of surgery will not be allowed to drive home.  Name and phone number of your driver:  Special Instructions: N/A              Please read over the following fact sheets you were given: _____________________________________________________________________             [facility] - Preparing for Surgery Before surgery, you can play an important role.  Because skin is not sterile, your skin needs to be as free of germs as possible.  You can reduce the number of germs on your skin by washing with CHG (chlorahexidine gluconate) soap before surgery.  CHG is an antiseptic cleaner which kills germs and bonds with the skin to continue killing germs even after washing. Please DO NOT use if you have an allergy to CHG or antibacterial soaps.  If your skin becomes reddened/irritated stop using the CHG and inform your nurse when you arrive at Short Stay. Do not shave (including legs and underarms) for at least 48 hours prior to the first CHG shower.  You may shave your face/neck. Please  follow these instructions carefully:  1.  Shower with CHG Soap the night before surgery and the  morning of Surgery.  2.  If you choose to wash your hair, wash your hair first as usual with your  normal  shampoo.  3.  After you shampoo, rinse your hair and body thoroughly to remove the  shampoo.                           4.  Use CHG as you would any other liquid soap.  You can apply chg directly  to the skin and wash                       Gently with a scrungie or clean washcloth.  5.  Apply the CHG Soap to your body ONLY FROM THE NECK DOWN.   Do not use on face/ open  Wound or open sores. Avoid contact with eyes, ears mouth and genitals (private parts).                       Wash face,  Genitals (private parts) with your normal soap.             6.  Wash thoroughly, paying special attention to the area where your surgery  will be performed.  7.  Thoroughly rinse your body with warm water from the neck down.  8.  DO NOT shower/wash with your normal soap after using and rinsing off  the CHG Soap.                9.  Pat yourself dry with a clean towel.            10.  Wear clean pajamas.            11.  Place clean sheets on your bed the night of your first shower and do not  sleep with pets. Day of Surgery : Do not apply any lotions/deodorants the morning of surgery.  Please wear clean clothes to the hospital/surgery center.  FAILURE TO FOLLOW THESE INSTRUCTIONS MAY RESULT IN THE CANCELLATION OF YOUR SURGERY PATIENT SIGNATURE_________________________________  NURSE SIGNATURE__________________________________  ________________________________________________________________________

## 2017-12-01 ENCOUNTER — Encounter (HOSPITAL_COMMUNITY): Payer: Self-pay

## 2017-12-01 ENCOUNTER — Encounter (HOSPITAL_COMMUNITY)
Admission: RE | Admit: 2017-12-01 | Discharge: 2017-12-01 | Disposition: A | Discharge status: Home / Self Care | Payer: BLUE CROSS/BLUE SHIELD | Source: Ambulatory Visit | Attending: Surgery | Admitting: Surgery

## 2017-12-01 ENCOUNTER — Other Ambulatory Visit: Payer: Self-pay

## 2017-12-01 DIAGNOSIS — Z01818 Encounter for other preprocedural examination: Secondary | ICD-10-CM | POA: Insufficient documentation

## 2017-12-01 DIAGNOSIS — R000 Tachycardia, unspecified: Secondary | ICD-10-CM | POA: Diagnosis not present

## 2017-12-01 HISTORY — DX: Fibromyalgia: M79.7

## 2017-12-01 LAB — BASIC METABOLIC PANEL
Anion gap: 10 (ref 5–15)
BUN: 9 mg/dL (ref 6–20)
CO2: 26 mmol/L (ref 22–32)
CREATININE: 0.73 mg/dL (ref 0.44–1.00)
Calcium: 9.1 mg/dL (ref 8.9–10.3)
Chloride: 107 mmol/L (ref 98–111)
GFR calc Af Amer: >60 mL/min (ref >60)
GFR calc non Af Amer: >60 mL/min (ref >60)
GLUCOSE: 98 mg/dL (ref 70–99)
POTASSIUM: 3.6 mmol/L (ref 3.5–5.1)
Sodium: 143 mmol/L (ref 135–145)

## 2017-12-01 LAB — CBC
HEMATOCRIT: 38.4 % (ref 36.0–46.0)
Hemoglobin: 12.7 g/dL (ref 12.0–15.0)
MCH: 28.2 pg (ref 26.0–34.0)
MCHC: 33.1 g/dL (ref 30.0–36.0)
MCV: 85.1 fL (ref 78.0–100.0)
Platelets: 257 10*3/uL (ref 150–400)
RBC: 4.51 MIL/uL (ref 3.87–5.11)
RDW: 13.2 % (ref 11.5–15.5)
WBC: 6.2 10*3/uL (ref 4.0–10.5)

## 2017-12-01 LAB — HCG, SERUM, QUALITATIVE: Preg, Serum: NEGATIVE

## 2017-12-01 NOTE — Progress Notes (Signed)
Chart left with Ihechi for follow up 

## 2017-12-01 NOTE — Progress Notes (Signed)
FYI pt. At preop today for surgery 12-09-17 with Dr. Luretha Murphy BP was elevated 155/111 . Thank you

## 2017-12-01 NOTE — Progress Notes (Signed)
Per Dr. Okey Dupre anesthesia pt. Not to take phentermine 12-01-17 till after surgery 12-09-17

## 2017-12-02 NOTE — Progress Notes (Signed)
Spoke with Lyla Son in EKG department about EKG done yesterday (12-01-17)at surgery pre-op not showing up in epic. Lyla Son says she is unsure why EKG not showing up but will send hard copy of EKG to be placed on patient chart and will attempt correct this issue.   EKG received and placed on patient chart

## 2017-12-08 NOTE — Progress Notes (Signed)
Called [facility] to request orders for 12/09/17 surgery. 

## 2017-12-08 NOTE — H&P (Signed)
Samantha Romero Documented: 11/24/2017 10:59 AM Location: [facility] Office Patient #: [id number] DOB: 1975-08-08 Single / Language: [place] / Race: Black or African American Female   History of Present Illness Samantha Hazard B. Daphine Deutscher MD; 11/24/2017 11:25 AM) The patient is a 42 year old female who presents with anal pain. She got constipated back in the holidays and with a lot of straining began having problems with hemorrhoids. She was referred by her primary care doctor to a gastroenterologist over in [facility] who performed a colonoscopy and told her that she had hemorrhoids she then performed hemorrhoidal banding. She is schedule her for another bout of a banding but due to his having pain and a tag hanging out at times.   Allergies Doristine Devoid, CMA; 11/24/2017 11:04 AM) SHELLFISH   Medication History Doristine Devoid, CMA; 11/24/2017 11:04 AM) Meloxicam (15MG  Tablet, Oral) Active. Protonix (40MG  Tablet DR, 1 (one) Oral daily, Taken starting 06/21/2016) Active. Phentermine HCl (37.5MG  Capsule, 1 (one) Oral daily, Taken starting 08/18/2017) Active. Xanax (1MG  Tablet, 1 (one) Tablet Oral two times daily, as needed, Taken starting 03/15/2016) Active. Topamax (50MG  Tablet, 1 (one) Oral daily, Taken starting 03/19/2016) Active. DULoxetine HCl (60MG  Capsule DR Part, Oral) Active. ProAir HFA (108 (90 Base)MCG/ACT Aerosol Soln, Inhalation) Active. ZyrTEC Allergy (10MG  Capsule, Oral) Active. Fluticasone-Salmeterol (232-14MCG/ACT Aero Pow Br Act, Inhalation) Active. Omeprazole (40MG  Capsule DR, Oral) Active. BuPROPion HCl ER (XL) (300MG  Tablet ER 24HR, Oral as needed) Active. TiZANidine HCl (6MG  Capsule, Oral) Active. PredniSONE (50MG  Tablet, Oral) Active. Fluticasone-Salmeterol (113-14MCG/ACT Aero Pow Br Act, Inhalation) Active. Medications Reconciled  Vitals (Chemira Jones CMA; 11/24/2017 11:04 AM) 11/24/2017 11:04 AM Weight: 309 lb Height: 62in Body Surface Area: 2.3 m  Body Mass Index: 56.52 kg/m  Pulse: 114 (Regular)  BP: 152/90 (Sitting, Left Arm, Standard)       Physical Exam   Obese AAF NAD HEENT unremarkable Chest -hx of asthma Heart SR Abdomen port palpable for lapband Rectal Note: on examination due to seems to have a squamocolumnar junction polyp on a stalk that gets tethered and pulled when it's outside. I showed her how to push it back inside but I think it'll need to be excised. Since his painful think is below the dentate line and would best be done under general anesthesia. We'll schedule at [facility] of next week hopefully for exam under anesthesia and removal of anal polyp.     Assessment & Plan Samantha Hazard B. Daphine Deutscher MD; 11/24/2017 11:27 AM) POLYP OF ANAL VERGE (K62.0) Impression: plan to go to the OR for exam under anesthesia and removal of anal polyp  Matt B. Daphine Deutscher, MD, FACS

## 2017-12-09 ENCOUNTER — Encounter (HOSPITAL_COMMUNITY): Payer: Self-pay | Admitting: Emergency Medicine

## 2017-12-09 ENCOUNTER — Ambulatory Visit (HOSPITAL_COMMUNITY)
Admission: RE | Admit: 2017-12-09 | Discharge: 2017-12-09 | Disposition: A | Discharge status: Home / Self Care | Payer: BLUE CROSS/BLUE SHIELD | Source: Ambulatory Visit | Attending: Surgery | Admitting: Surgery

## 2017-12-09 ENCOUNTER — Ambulatory Visit (HOSPITAL_COMMUNITY): Payer: BLUE CROSS/BLUE SHIELD | Admitting: Certified Registered Nurse Anesthetist

## 2017-12-09 ENCOUNTER — Encounter (HOSPITAL_COMMUNITY)
Admission: RE | Disposition: A | Discharge status: Home / Self Care | Payer: Self-pay | Source: Ambulatory Visit | Attending: Surgery

## 2017-12-09 ENCOUNTER — Telehealth (HOSPITAL_COMMUNITY): Payer: Self-pay | Admitting: *Deleted

## 2017-12-09 DIAGNOSIS — K620 Anal polyp: Secondary | ICD-10-CM | POA: Diagnosis not present

## 2017-12-09 DIAGNOSIS — F329 Major depressive disorder, single episode, unspecified: Secondary | ICD-10-CM | POA: Diagnosis not present

## 2017-12-09 DIAGNOSIS — Z7951 Long term (current) use of inhaled steroids: Secondary | ICD-10-CM | POA: Insufficient documentation

## 2017-12-09 DIAGNOSIS — K6289 Other specified diseases of anus and rectum: Secondary | ICD-10-CM | POA: Diagnosis present

## 2017-12-09 DIAGNOSIS — J45909 Unspecified asthma, uncomplicated: Secondary | ICD-10-CM | POA: Insufficient documentation

## 2017-12-09 DIAGNOSIS — Z79899 Other long term (current) drug therapy: Secondary | ICD-10-CM | POA: Insufficient documentation

## 2017-12-09 DIAGNOSIS — Z791 Long term (current) use of non-steroidal anti-inflammatories (NSAID): Secondary | ICD-10-CM | POA: Diagnosis not present

## 2017-12-09 DIAGNOSIS — K219 Gastro-esophageal reflux disease without esophagitis: Secondary | ICD-10-CM | POA: Diagnosis not present

## 2017-12-09 DIAGNOSIS — F419 Anxiety disorder, unspecified: Secondary | ICD-10-CM | POA: Insufficient documentation

## 2017-12-09 DIAGNOSIS — I10 Essential (primary) hypertension: Secondary | ICD-10-CM | POA: Insufficient documentation

## 2017-12-09 DIAGNOSIS — Z7952 Long term (current) use of systemic steroids: Secondary | ICD-10-CM | POA: Insufficient documentation

## 2017-12-09 SURGERY — EXAM UNDER ANESTHESIA
Anesthesia: General | Site: Rectum

## 2017-12-09 MED ORDER — FENTANYL CITRATE (PF) 100 MCG/2ML IJ SOLN
INTRAMUSCULAR | Status: AC
  Filled 2017-12-09: qty 2

## 2017-12-09 MED ORDER — CELECOXIB 200 MG PO CAPS
200.0000 mg | ORAL_CAPSULE | ORAL | Status: AC
  Administered 2017-12-09: 200 mg via ORAL
  Filled 2017-12-09: qty 1

## 2017-12-09 MED ORDER — OXYCODONE HCL 5 MG PO TABS: 5.0000 mg | ORAL_TABLET | Freq: Once | ORAL | Status: DC | PRN

## 2017-12-09 MED ORDER — FENTANYL CITRATE (PF) 100 MCG/2ML IJ SOLN: 25.0000 ug | INTRAMUSCULAR | Status: DC | PRN

## 2017-12-09 MED ORDER — 0.9 % SODIUM CHLORIDE (POUR BTL) OPTIME
TOPICAL | Status: DC | PRN
  Administered 2017-12-09: 1000 mL

## 2017-12-09 MED ORDER — LIDOCAINE HCL (CARDIAC) PF 100 MG/5ML IV SOSY
PREFILLED_SYRINGE | INTRAVENOUS | Status: DC | PRN
  Administered 2017-12-09: 100 mg via INTRAVENOUS

## 2017-12-09 MED ORDER — CHLORHEXIDINE GLUCONATE CLOTH 2 % EX PADS: 6.0000 | MEDICATED_PAD | Freq: Once | CUTANEOUS | Status: DC

## 2017-12-09 MED ORDER — ONDANSETRON HCL 4 MG/2ML IJ SOLN: 4.0000 mg | Freq: Once | INTRAMUSCULAR | Status: DC | PRN

## 2017-12-09 MED ORDER — SODIUM CHLORIDE 0.9 % IJ SOLN
INTRAMUSCULAR | Status: DC | PRN
  Administered 2017-12-09: 10 mL via INTRAVENOUS

## 2017-12-09 MED ORDER — MIDAZOLAM HCL 5 MG/5ML IJ SOLN
INTRAMUSCULAR | Status: DC | PRN
  Administered 2017-12-09 (×2): 1 mg via INTRAVENOUS

## 2017-12-09 MED ORDER — METOCLOPRAMIDE HCL 5 MG/ML IJ SOLN
INTRAMUSCULAR | Status: DC | PRN
  Administered 2017-12-09: 5 mg via INTRAVENOUS

## 2017-12-09 MED ORDER — PROPOFOL 10 MG/ML IV BOLUS
INTRAVENOUS | Status: AC
  Filled 2017-12-09: qty 20

## 2017-12-09 MED ORDER — OXYCODONE HCL 5 MG/5ML PO SOLN
5.0000 mg | Freq: Once | ORAL | Status: DC | PRN
  Filled 2017-12-09: qty 5

## 2017-12-09 MED ORDER — ONDANSETRON HCL 4 MG/2ML IJ SOLN
INTRAMUSCULAR | Status: DC | PRN
  Administered 2017-12-09: 4 mg via INTRAVENOUS

## 2017-12-09 MED ORDER — PROPOFOL 10 MG/ML IV BOLUS
INTRAVENOUS | Status: DC | PRN
  Administered 2017-12-09: 25 mg via INTRAVENOUS
  Administered 2017-12-09: 200 mg via INTRAVENOUS
  Administered 2017-12-09: 25 mg via INTRAVENOUS

## 2017-12-09 MED ORDER — HEPARIN SODIUM (PORCINE) 5000 UNIT/ML IJ SOLN
5000.0000 [IU] | Freq: Once | INTRAMUSCULAR | Status: AC
  Administered 2017-12-09: 5000 [IU] via SUBCUTANEOUS
  Filled 2017-12-09: qty 1

## 2017-12-09 MED ORDER — HYDROCODONE-ACETAMINOPHEN 5-325 MG PO TABS: 1.0000 | ORAL_TABLET | Freq: Four times a day (QID) | ORAL | 0 refills | Status: DC | PRN

## 2017-12-09 MED ORDER — BUPIVACAINE LIPOSOME 1.3 % IJ SUSP
20.0000 mL | Freq: Once | INTRAMUSCULAR | Status: DC
  Filled 2017-12-09: qty 20

## 2017-12-09 MED ORDER — SODIUM CHLORIDE 0.9 % IJ SOLN
INTRAMUSCULAR | Status: AC
  Filled 2017-12-09: qty 10

## 2017-12-09 MED ORDER — BUPIVACAINE LIPOSOME 1.3 % IJ SUSP
INTRAMUSCULAR | Status: DC | PRN
  Administered 2017-12-09: 20 mL

## 2017-12-09 MED ORDER — FENTANYL CITRATE (PF) 100 MCG/2ML IJ SOLN
INTRAMUSCULAR | Status: DC | PRN
  Administered 2017-12-09 (×3): 50 ug via INTRAVENOUS

## 2017-12-09 MED ORDER — MIDAZOLAM HCL 2 MG/2ML IJ SOLN
INTRAMUSCULAR | Status: AC
  Filled 2017-12-09: qty 2

## 2017-12-09 MED ORDER — GABAPENTIN 300 MG PO CAPS
300.0000 mg | ORAL_CAPSULE | ORAL | Status: AC
  Administered 2017-12-09: 300 mg via ORAL
  Filled 2017-12-09: qty 1

## 2017-12-09 MED ORDER — FLEET ENEMA 7-19 GM/118ML RE ENEM
1.0000 | ENEMA | Freq: Once | RECTAL | Status: AC
  Administered 2017-12-09: 1 via RECTAL
  Filled 2017-12-09: qty 1

## 2017-12-09 MED ORDER — CEFAZOLIN SODIUM-DEXTROSE 2-4 GM/100ML-% IV SOLN
2.0000 g | INTRAVENOUS | Status: AC
  Administered 2017-12-09: 2 g via INTRAVENOUS
  Filled 2017-12-09: qty 100

## 2017-12-09 MED ORDER — LACTATED RINGERS IV SOLN
INTRAVENOUS | Status: DC
  Administered 2017-12-09 (×2): via INTRAVENOUS

## 2017-12-09 MED ORDER — DEXAMETHASONE SODIUM PHOSPHATE 10 MG/ML IJ SOLN
INTRAMUSCULAR | Status: DC | PRN
  Administered 2017-12-09: 10 mg via INTRAVENOUS

## 2017-12-09 MED ORDER — ACETAMINOPHEN 500 MG PO TABS
1000.0000 mg | ORAL_TABLET | ORAL | Status: AC
  Administered 2017-12-09: 1000 mg via ORAL
  Filled 2017-12-09: qty 2

## 2017-12-09 SURGICAL SUPPLY — 35 items
BLADE HEX COATED 2.75 (ELECTRODE) ×1 IMPLANT
BLADE SURG 15 STRL LF DISP TIS (BLADE) ×1 IMPLANT
BLADE SURG 15 STRL SS (BLADE) ×2
COVER SURGICAL LIGHT HANDLE (MISCELLANEOUS) ×2 IMPLANT
DECANTER SPIKE VIAL GLASS SM (MISCELLANEOUS) ×2 IMPLANT
DRAIN PENROSE 18X1/2 LTX STRL (DRAIN) IMPLANT
DRSG PAD ABDOMINAL 8X10 ST (GAUZE/BANDAGES/DRESSINGS) ×1 IMPLANT
ELECT PENCIL ROCKER SW 15FT (MISCELLANEOUS) ×2 IMPLANT
ELECT REM PT RETURN 15FT ADLT (MISCELLANEOUS) ×2 IMPLANT
GAUZE 4X4 16PLY RFD (DISPOSABLE) ×2 IMPLANT
GAUZE SPONGE 4X4 12PLY STRL (GAUZE/BANDAGES/DRESSINGS) ×2 IMPLANT
GLOVE BIOGEL M 8.0 STRL (GLOVE) ×2 IMPLANT
GLOVE BIOGEL PI IND STRL 7.0 (GLOVE) ×1 IMPLANT
GLOVE BIOGEL PI INDICATOR 7.0 (GLOVE) ×1
GOWN SPEC L4 XLG W/TWL (GOWN DISPOSABLE) ×1 IMPLANT
GOWN STRL REUS W/TWL LRG LVL3 (GOWN DISPOSABLE) ×1 IMPLANT
GOWN STRL REUS W/TWL XL LVL3 (GOWN DISPOSABLE) ×6 IMPLANT
KIT BASIN OR (CUSTOM PROCEDURE TRAY) ×2 IMPLANT
LUBRICANT JELLY K Y 4OZ (MISCELLANEOUS) ×2 IMPLANT
NDL HYPO 25X1 1.5 SAFETY (NEEDLE) ×1 IMPLANT
NDL SAFETY ECLIPSE 18X1.5 (NEEDLE) ×1 IMPLANT
NEEDLE HYPO 18GX1.5 SHARP (NEEDLE)
NEEDLE HYPO 25X1 1.5 SAFETY (NEEDLE) ×2 IMPLANT
NS IRRIG 1000ML POUR BTL (IV SOLUTION) ×2 IMPLANT
PACK LITHOTOMY IV (CUSTOM PROCEDURE TRAY) ×1 IMPLANT
PAD ABD 7.5X8 STRL (GAUZE/BANDAGES/DRESSINGS) ×1 IMPLANT
SHEARS HARMONIC 9CM CVD (BLADE) ×1 IMPLANT
SPONGE HEMORRHOID 8X3CM (HEMOSTASIS) ×1 IMPLANT
SPONGE SURGIFOAM ABS GEL 12-7 (HEMOSTASIS) IMPLANT
SUT CHROMIC 2 0 SH (SUTURE) IMPLANT
SUT CHROMIC 3 0 SH 27 (SUTURE) ×1 IMPLANT
SYR CONTROL 10ML LL (SYRINGE) ×2 IMPLANT
TAPE CLOTH SURG 4X10 WHT LF (GAUZE/BANDAGES/DRESSINGS) ×1 IMPLANT
UNDERPAD 30X30 (UNDERPADS AND DIAPERS) ×2 IMPLANT
YANKAUER SUCT BULB TIP 10FT TU (MISCELLANEOUS) ×2 IMPLANT

## 2017-12-09 NOTE — Discharge Instructions (Signed)
May shower and take sitz baths as needed.  How to Take a Sitz Bath A sitz bath is a warm water bath that is taken while you are sitting down. The water should only come up to your hips and should cover your buttocks. Your health care provider may recommend a sitz bath to help you:  Clean the lower part of your body, including your genital area.  With itching.  With pain.  With sore muscles or muscles that tighten or spasm.  How to take a sitz bath Take 3-4 sitz baths per day or as told by your health care provider. 1. Partially fill a bathtub with warm water. You will only need the water to be deep enough to cover your hips and buttocks when you are sitting in it. 2. If your health care provider told you to put medicine in the water, follow the directions exactly. 3. Sit in the water and open the tub drain a little. 4. Turn on the warm water again to keep the tub at the correct level. Keep the water running constantly. 5. Soak in the water for 15-20 minutes or as told by your health care provider. 6. After the sitz bath, pat the affected area dry first. Do not rub it. 7. Be careful when you stand up after the sitz bath because you may feel dizzy.  Contact a health care provider if:  Your symptoms get worse. Do not continue with sitz baths if your symptoms get worse.  You have new symptoms. Do not continue with sitz baths until you talk with your health care provider. This information is not intended to replace advice given to you by your health care provider. Make sure you discuss any questions you have with your health care provider. Document Released: 12/06/2003 Document Revised: 08/13/2015 Document Reviewed: 03/13/2014 Elsevier Interactive Patient Education  [employer].

## 2017-12-09 NOTE — Transfer of Care (Signed)
Immediate Anesthesia Transfer of Care Note  Patient: Samantha Romero  Procedure(s) Performed: EXAM UNDER ANESTHESIA  AND REMOVAL OF ANAL POLYP (N/A Rectum)  Patient Location: PACU  Anesthesia Type:General  Level of Consciousness: awake, oriented, drowsy and patient cooperative  Airway & Oxygen Therapy: Patient Spontanous Breathing and Patient connected to face mask oxygen  Post-op Assessment: Report given to RN, Post -op Vital signs reviewed and stable and Patient moving all extremities  Post vital signs: Reviewed and stable  Last Vitals:  Vitals Value Taken Time  BP 174/130 12/09/2017 12:18 PM  Temp 36.5 C 12/09/2017 12:18 PM  Pulse 105 12/09/2017 12:19 PM  Resp 18 12/09/2017 12:18 PM  SpO2 100 % 12/09/2017 12:19 PM  Vitals shown include unvalidated device data.  Last Pain:  Vitals:   12/09/17 1010  TempSrc:   PainSc: 0-No pain      Patients Stated Pain Goal: 4 (12/09/17 1010)  Complications: No apparent anesthesia complications

## 2017-12-09 NOTE — Anesthesia Postprocedure Evaluation (Signed)
Anesthesia Post Note  Patient: Samantha Romero  Procedure(s) Performed: EXAM UNDER ANESTHESIA  AND REMOVAL OF ANAL POLYP (N/A Rectum)     Patient location during evaluation: PACU Anesthesia Type: General Level of consciousness: awake and alert Pain management: pain level controlled Vital Signs Assessment: post-procedure vital signs reviewed and stable Respiratory status: spontaneous breathing, nonlabored ventilation, respiratory function stable and patient connected to nasal cannula oxygen Cardiovascular status: blood pressure returned to baseline and stable Postop Assessment: no apparent nausea or vomiting Anesthetic complications: no    Last Vitals:  Vitals:   12/09/17 1315 12/09/17 1352  BP: (!) 151/107 (!) 150/107  Pulse: 98 91  Resp: 12 14  Temp: 36.5 C   SpO2: 98% 96%    Last Pain:  Vitals:   12/09/17 1352  TempSrc:   PainSc: 0-No pain                 Lucretia Kern

## 2017-12-09 NOTE — Interval H&P Note (Signed)
History and Physical Interval Note:  12/09/2017 11:13 AM  Samantha Romero  has presented today for surgery, with the diagnosis of ANAL POLYP  The various methods of treatment have been discussed with the patient and family. After consideration of risks, benefits and other options for treatment, the patient has consented to  Procedure(s): EXAM UNDER ANESTHESIA AND REMOVAL OF ANAL POLYP (N/A) as a surgical intervention .  The patient's history has been reviewed, patient examined, no change in status, stable for surgery.  I have reviewed the patient's chart and labs.  Questions were answered to the patient's satisfaction.     Valarie Merino

## 2017-12-09 NOTE — Progress Notes (Addendum)
Dr. Darlin Drop notified of pts bp 161/114, no new orders given, states is okay to proceed to surgery.

## 2017-12-09 NOTE — Anesthesia Procedure Notes (Signed)
Procedure Name: LMA Insertion Date/Time: 12/09/2017 11:30 AM Performed by: Lucretia Kern, MD Pre-anesthesia Checklist: Emergency Drugs available, Patient identified, Suction available, Patient being monitored and Timeout performed Patient Re-evaluated:Patient Re-evaluated prior to induction Oxygen Delivery Method: Circle system utilized Preoxygenation: Pre-oxygenation with 100% oxygen Induction Type: IV induction Ventilation: Mask ventilation without difficulty LMA: LMA with gastric port inserted LMA Size: 4.0 Number of attempts: 1 Placement Confirmation: positive ETCO2 Tube secured with: Tape Dental Injury: Teeth and Oropharynx as per pre-operative assessment

## 2017-12-09 NOTE — Anesthesia Preprocedure Evaluation (Signed)
Anesthesia Evaluation  Patient identified by MRN, date of birth, ID band Patient awake    History of Anesthesia Complications Negative for: history of anesthetic complications  Airway Mallampati: I  TM Distance: >3 FB Neck ROM: Full    Dental no notable dental hx.    Pulmonary asthma ,    Pulmonary exam normal        Cardiovascular hypertension, Normal cardiovascular exam     Neuro/Psych Anxiety Depression negative neurological ROS     GI/Hepatic negative GI ROS, Neg liver ROS, neg GERD  ,  Endo/Other  negative endocrine ROS  Renal/GU negative Renal ROS  negative genitourinary   Musculoskeletal  (+) Fibromyalgia -  Abdominal (+) + obese,   Peds  Hematology negative hematology ROS (+)   Anesthesia Other Findings   Reproductive/Obstetrics                             Anesthesia Physical Anesthesia Plan  ASA: III  Anesthesia Plan: General   Post-op Pain Management:    Induction:   PONV Risk Score and Plan: 3 and Ondansetron, Dexamethasone and Treatment may vary due to age or medical condition  Airway Management Planned: Oral ETT  Additional Equipment:   Intra-op Plan:   Post-operative Plan: Extubation in OR  Informed Consent: I have reviewed the patients History and Physical, chart, labs and discussed the procedure including the risks, benefits and alternatives for the proposed anesthesia with the patient or authorized representative who has indicated his/her understanding and acceptance.     Plan Discussed with:   Anesthesia Plan Comments:         Anesthesia Quick Evaluation

## 2017-12-09 NOTE — Op Note (Signed)
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  After anesthesia was administered and the patient was prepped  with [employer] and a timeout was performed.  In dorsal lithotomy the polyp was prolapsing.  I isolated the base and transected it with the harmonic scalpel.  I oversewed it with a fig of 8 of 3-0  Chromic.  I injected the perirectal region with 20 cc of Exparel and placed a thrombostatic pad.    The patient tolerated the procedure well and was taken to the PACU in stable condition.     Matt B. Daphine Deutscher, MD, [facility], [place] [phone]

## 2018-08-14 ENCOUNTER — Telehealth (INDEPENDENT_AMBULATORY_CARE_PROVIDER_SITE_OTHER): Payer: BC Managed Care – PPO | Admitting: Family Medicine

## 2018-08-14 ENCOUNTER — Other Ambulatory Visit: Payer: Self-pay

## 2018-08-14 ENCOUNTER — Telehealth: Payer: Self-pay | Admitting: Family Medicine

## 2018-08-14 DIAGNOSIS — M797 Fibromyalgia: Secondary | ICD-10-CM

## 2018-08-14 DIAGNOSIS — F4323 Adjustment disorder with mixed anxiety and depressed mood: Secondary | ICD-10-CM | POA: Diagnosis not present

## 2018-08-14 DIAGNOSIS — Z9884 Bariatric surgery status: Secondary | ICD-10-CM

## 2018-08-14 DIAGNOSIS — E66813 Obesity, class 3: Secondary | ICD-10-CM

## 2018-08-14 DIAGNOSIS — J4541 Moderate persistent asthma with (acute) exacerbation: Secondary | ICD-10-CM

## 2018-08-14 DIAGNOSIS — I10 Essential (primary) hypertension: Secondary | ICD-10-CM

## 2018-08-14 DIAGNOSIS — Z6841 Body Mass Index (BMI) 40.0 and over, adult: Secondary | ICD-10-CM

## 2018-08-14 MED ORDER — BUPROPION HCL ER (XL) 150 MG PO TB24: 150.0000 mg | ORAL_TABLET | Freq: Every day | ORAL | 0 refills | Status: DC

## 2018-08-14 MED ORDER — PREDNISONE 20 MG PO TABS: 40.0000 mg | ORAL_TABLET | Freq: Every day | ORAL | 0 refills | Status: DC

## 2018-08-14 MED ORDER — FLUTICASONE PROPIONATE HFA 110 MCG/ACT IN AERO: 1.0000 | INHALATION_SPRAY | Freq: Two times a day (BID) | RESPIRATORY_TRACT | 2 refills | Status: DC

## 2018-08-14 MED ORDER — ALPRAZOLAM 1 MG PO TABS: 1.0000 mg | ORAL_TABLET | Freq: Three times a day (TID) | ORAL | 0 refills | Status: DC | PRN

## 2018-08-14 MED ORDER — DULOXETINE HCL 30 MG PO CPEP: 30.0000 mg | ORAL_CAPSULE | Freq: Every day | ORAL | 0 refills | Status: DC

## 2018-08-14 MED ORDER — MONTELUKAST SODIUM 10 MG PO TABS: 10.0000 mg | ORAL_TABLET | Freq: Every day | ORAL | 5 refills | Status: DC

## 2018-08-14 MED ORDER — AMLODIPINE BESYLATE 5 MG PO TABS: 5.0000 mg | ORAL_TABLET | Freq: Every day | ORAL | 0 refills | Status: DC

## 2018-08-14 NOTE — Progress Notes (Signed)
Virtual Visit Note  I connected with patient on 08/14/18 at 229pm by phone and verified that I am speaking with the correct person using two identifiers. Samantha Romero is currently located in her car and patient is currently with them during visit. The provider, Rutherford Guys, MD is located in their office at time of visit.  I discussed the limitations, risks, security and privacy concerns of performing an evaluation and management service by telephone and the availability of in person appointments. I also discussed with the patient that there may be a patient responsible charge related to this service. The patient expressed understanding and agreed to proceed.   CC: establish care  HPI ? Last PCP at [facility] ~ Oct 2019 Lost insurance and therefore has not seen anyone until now  HTN -  Takes amlodipine '5mg'$  once a day Has never been on anything else Last BP checked at bariatric surgeon, high but off medication, he refilled for 30 days  Obesity -  s/p lap band in 2009, Dr Hassell Done Originally was doing well Has regained weight due to uncontrolled fibromyalgia and depression Last OV about 2 weeks Started her on phentermine Last weight 318 lbs  Asthma -   Diagnosed in childhood Main triggers: allergies, dust, mostly environmental triggers and GERD Used to take singulair, advair, albuterol however then asthma improved for several years Returned, GERD was main, started on prilosec - now not a concern Nov 2019 - having sx for several months, despite pred Wheezing all the time Allergies are not well controlled Using albuterol 3-4 x day  Fibromyalgia -  Diagnosed after bariatric surgery Was well controlled until she ran out of medication due to loss of insurance cymbalta 60 mg once day  Anxiety and depression- Off her medications Depression used to be well controlled Has 2 very stress jobs, fibromylagia, contributes Panic attacks and vivid dreams - wakes up in  the middle of the night Has seen psychiatry wellbutrin XL '300mg'$   cymbalta '60mg'$  once a day  pmp reviewed Last rx 40 tabs xanax Has not been taking daily, on really bad days will take TID, wo any issues Sees therapist Gained weight with sertraline Gets numbness and tingling with buspar Does not think that she has every tried citalopram and lexapro    Allergies  Allergen Reactions  . Grass Extracts [Gramineae Pollens]     Nasal congestion, post nasal drip   . Shellfish Allergy Itching    In throat only.    Prior to Admission medications   Medication Sig Start Date End Date Taking? Authorizing Provider  albuterol (PROAIR HFA) 108 (90 BASE) MCG/ACT inhaler Inhale 2 puffs into the lungs every 6 (six) hours as needed for wheezing or shortness of breath.    [provider]  albuterol (PROVENTIL) (2.5 MG/3ML) 0.083% nebulizer solution Take 3 mLs (2.5 mg total) by nebulization every 4 (four) hours as needed for wheezing or shortness of breath. 06/11/16   Melynda Ripple, MD  ALPRAZolam Duanne Moron) 1 MG tablet Take 1 mg by mouth 2 (two) times daily as needed for anxiety.    [provider]  buPROPion (WELLBUTRIN XL) 300 MG 24 hr tablet Take 300 mg by mouth daily.    [provider]  cetirizine (ZYRTEC) 10 MG tablet Take 10 mg by mouth daily with breakfast.    [provider]  diphenhydrAMINE (BENADRYL) 12.5 MG/5ML liquid Take 25 mg by mouth daily as needed for allergies.    [provider]  DULoxetine (CYMBALTA) 60 MG capsule Take 60 mg by mouth daily.    [provider]  famotidine (PEPCID) 20 MG tablet Take 1 tablet (20 mg total) by mouth 2 (two) times daily. Patient not taking: Reported on 12/01/2017 02/23/15   Carlisle Cater, PA-C  fluticasone [facility]) 50 MCG/ACT nasal spray Place 1 spray into both nostrils every morning.    [provider]  HYDROcodone-acetaminophen (NORCO/VICODIN) 5-325 MG tablet Take 1 tablet by mouth every 6  (six) hours as needed for moderate pain. 12/09/17   Johnathan Hausen, MD  ipratropium (ATROVENT) 0.06 % nasal spray Place 2 sprays into both nostrils 4 (four) times daily. 3-4 times/ day Patient not taking: Reported on 12/01/2017 06/11/16   Melynda Ripple, MD  meloxicam (MOBIC) 15 MG tablet Take 15 mg by mouth daily as needed for pain.    [provider]  omeprazole (PRILOSEC) 40 MG capsule Take 1 capsule (40 mg total) by mouth every morning. Patient not taking: Reported on 12/01/2017 06/03/15   Jiles Prows, MD  phentermine (ADIPEX-P) 37.5 MG tablet Take 37.5 mg by mouth daily.    [provider]  ranitidine (ZANTAC) 300 MG tablet Take 1 tablet (300 mg total) by mouth every morning. Patient not taking: Reported on 12/01/2017 06/03/15   Jiles Prows, MD  tizanidine (ZANAFLEX) 6 MG capsule Take 6 mg by mouth 2 (two) times daily as needed for muscle spasms.    [provider]    Past Medical History:  Diagnosis Date  . Anxiety    panic attacks  . Asthma   . Depression   . Fibromyalgia   . Hypertension    no meds currently  . No pertinent past medical history   . Pneumonia    hx of   . Sinus problem     Past Surgical History:  Procedure Laterality Date  . ESOPHAGOGASTRODUODENOSCOPY N/A 11/26/2014   Procedure: ESOPHAGOGASTRODUODENOSCOPY (EGD);  Surgeon: Alphonsa Overall, MD;  Location: [facility];  Service: General;  Laterality: N/A;  . GASTRIC BANDING PORT REVISION  08/24/11  . LAPAROSCOPIC GASTRIC BANDING  12/26/2007  . LAPAROSCOPIC LYSIS OF ADHESIONS N/A 01/06/2015   Procedure: LAPAROSCOPIC LYSIS OF ADHESIONS;  Surgeon: Johnathan Hausen, MD;  Location: [facility];  Service: General;  Laterality: N/A;  . LAPAROSCOPIC REPAIR AND REMOVAL OF GASTRIC BAND N/A 01/06/2015   Procedure: LAPAROSCOPIC RESIGHTING OF GASTRIC Band PORT;  Surgeon: Johnathan Hausen, MD;  Location: [facility];  Service: General;  Laterality: N/A;  . NO PAST SURGERIES      Social History   Tobacco Use  .  Smoking status: Never Smoker  . Smokeless tobacco: Never Used  Substance Use Topics  . Alcohol use: Yes    Comment: socially    Family History  Problem Relation Age of Onset  . Cancer Maternal Uncle        lung  . Cancer Maternal Grandfather        lung and back    Review of Systems  Constitutional: Positive for malaise/fatigue. Negative for chills and fever.  HENT: Negative for congestion and sinus pain.   Respiratory: Positive for cough, shortness of breath and wheezing.   Cardiovascular: Negative for chest pain, palpitations and leg swelling.  Gastrointestinal: Negative for abdominal pain, heartburn, nausea and vomiting.  Musculoskeletal: Positive for myalgias.  Endo/Heme/Allergies: Positive for environmental allergies.  Psychiatric/Behavioral: Positive for depression. Negative for suicidal ideas. The patient is nervous/anxious and has insomnia.    Per hpi  Objective  Vitals as reported by the patient: per above   ASSESSMENT and PLAN  1. Fibromyalgia  2. Adjustment reaction with anxiety and depression  3. Hx of laparoscopic gastric banding  4. Class 3 severe obesity due to excess calories without serious comorbidity with body mass index (BMI) of 45.0 to 49.9 in adult (HCC) - Hemoglobin A1c; Future - Lipid panel; Future - TSH; Future  5. Essential hypertension - Hemoglobin A1c; Future - Lipid panel; Future - TSH; Future - CMP14+EGFR; Future  6. Moderate persistent asthma with acute exacerbation  Chronic medical conditions not controlled due to recent loss of insurance and being off medications. Restarting daily meds today. Discussed will titrate wellbutrin and cymbalta to previous doses. Alprazolam prn, reviewed r/se/b. Treating current asthma exacerbation with oral pred burst. RTC precautions discussed.  PMH, PSH, meds, allergies, Fhx, Shx, reviewed with patient today.  Other orders - ALPRAZolam (XANAX) 1 MG tablet; Take 1 tablet (1 mg total) by mouth 3  (three) times daily as needed for anxiety. - buPROPion (WELLBUTRIN XL) 150 MG 24 hr tablet; Take 1 tablet (150 mg total) by mouth daily. - DULoxetine (CYMBALTA) 30 MG capsule; Take 1 capsule (30 mg total) by mouth daily. - amLODipine (NORVASC) 5 MG tablet; Take 1 tablet (5 mg total) by mouth daily. - predniSONE (DELTASONE) 20 MG tablet; Take 2 tablets (40 mg total) by mouth daily with breakfast. - fluticasone (FLOVENT HFA) 110 MCG/ACT inhaler; Inhale 1 puff into the lungs 2 (two) times daily. - montelukast (SINGULAIR) 10 MG tablet; Take 1 tablet (10 mg total) by mouth at bedtime.  FOLLOW-UP: fasting labs within 1 week, followup in office in 2 weeks for asthma   The above assessment and management plan was discussed with the patient. The patient verbalized understanding of and has agreed to the management plan. Patient is aware to call the clinic if symptoms persist or worsen. Patient is aware when to return to the clinic for a follow-up visit. Patient educated on when it is appropriate to go to the emergency department.    I provided 45 minutes of non-face-to-face time during this encounter.  Rutherford Guys, MD [facility] at [place], [place] [address] Ph.  [phone] Fax [phone]

## 2018-08-14 NOTE — Progress Notes (Signed)
New patient presenting htn, obesity, asthma and fibromyalgia. Needing refills on pended medications. Pharmacy verified. Starting to have more wheezing than usual, has gained 40lbs since December. Asking for a additional tx to go along with the albuterol she's using. States that she is becoming a lot more winded with walking and climbing stairs, weight 318lb. Wants to know what medication can she take that will help with th fibromyalgia pain. Completed anxiety for score of 19. The Xanax, she is taking is supposed to be tid instead of bid. Became bid due to no insurance.

## 2018-08-14 NOTE — Telephone Encounter (Signed)
08/14/2018 - PATIENT HAD A TELEMED VISIT WITH DR. Darcel Bayley ON Monday (08/14/2018). DR. Darcel Bayley HAS REQUESTED SHE COME INTO THE OFFICE IN 2 WEEKS FOR A RECHECK ON HER ASTHMA. SHE ALSO WANTS HER TO HAVE A LAB VISIT WITHIN 1 WEEK. I TRIED TO CALL AND SCHEDULE BUT HAD TO LEAVE HER A VOICE MAIL TO RETURN OUR CALL. MBC

## 2018-08-17 ENCOUNTER — Telehealth: Payer: Self-pay | Admitting: General Practice

## 2018-08-17 NOTE — Telephone Encounter (Signed)
Copied from CRM [id number]. Topic: Quick Communication - Rx Refill/Question >> Aug 17, 2018  3:34 PM Richarda Blade wrote: Medication: albuterol (PROVENTIL) (2.5 MG/3ML) 0.083% nebulizer solution 2.5 mg   Has the patient contacted their pharmacy? Yes.   (Agent: If yes, when and what did the pharmacy advise?) The pharmacy had not gotten it in and she wanted it yo go to a different   Preferred Pharmacy (with phone number or street name): [id number] - [place], [place] - [address] AT [facility] [phone] (Phone) [phone] (Fax)    Agent: Please be advised that RX refills may take up to 3 business days. We ask that you follow-up with your pharmacy.

## 2018-08-18 ENCOUNTER — Telehealth: Payer: Self-pay | Admitting: Family Medicine

## 2018-08-18 ENCOUNTER — Ambulatory Visit: Payer: Self-pay | Admitting: Family Medicine

## 2018-08-18 NOTE — Telephone Encounter (Signed)
Copied from CRM [id number]. Topic: Quick Communication - See Telephone Encounter >> Aug 18, 2018  4:23 PM Terisa Starr wrote: CRM for notification. See Telephone encounter for: 08/18/18. Patient was advised to call around 4:15 if she had not heard from the office regarding her plan of treatment before the weekend, please see triage encounter

## 2018-08-18 NOTE — Telephone Encounter (Signed)
Pt called in c/o pharmacy not receiving her nebulizer bullets Dr. Leretha Pol ordered.   She also wants her prednisone extended.   Dr. Leretha Pol mentioned if she was not better she would extend the prescription.  I sent this to Dr. Adela Glimpse office at [facility] at [place].    Reason for Disposition . [1] Longstanding difficulty breathing AND [2] not responding to usual therapy    Has asthma.  Not responding to inhaler.   Needs nebulizer  Bullets sent to pharmacy.   They still don't have them after seeing Dr. Leretha Pol.  Answer Assessment - Initial Assessment Questions 1. RESPIRATORY STATUS: "Describe your breathing?" (e.g., wheezing, shortness of breath, unable to speak, severe coughing)      My asthma is acting up.   My inhaler hasn't helped much.    I saw [place] and she said I could do nebulizer treatments.   They are suppose send the medications to my pharmacy but they are not there.   Can the prednisone be extended? 2. ONSET: "When did this breathing problem begin?"      Started a while ago.   The last few weeks have been bad   I have stayed home with this coronavirus.  I live alone and haven't been out.   I've not traveled or been exposed to the coronavirus. 3. PATTERN "Does the difficult breathing come and go, or has it been constant since it started?"      I need breathing treatments that would help a lot. 4. SEVERITY: "How bad is your breathing?" (e.g., mild, moderate, severe)    - MILD: No SOB at rest, mild SOB with walking, speaks normally in sentences, can lay down, no retractions, pulse < 100.    - MODERATE: SOB at rest, SOB with minimal exertion and prefers to sit, cannot lie down flat, speaks in phrases, mild retractions, audible wheezing, pulse 100-120.    - SEVERE: Very SOB at rest, speaks in single words, struggling to breathe, sitting hunched forward, retractions, pulse > 120      It's been especially bad this spring.   I've had asthma all my life but this season is really  bothering me. 5. RECURRENT SYMPTOM: "Have you had difficulty breathing before?" If so, ask: "When was the last time?" and "What happened that time?"      Yes   Had asthma all my life 6. CARDIAC HISTORY: "Do you have any history of heart disease?" (e.g., heart attack, angina, bypass surgery, angioplasty)      Not asked 7. LUNG HISTORY: "Do you have any history of lung disease?"  (e.g., pulmonary embolus, asthma, emphysema)     asthma 8. CAUSE: "What do you think is causing the breathing problem?"      Maybe the pollen 9. OTHER SYMPTOMS: "Do you have any other symptoms? (e.g., dizziness, runny nose, cough, chest pain, fever)     No 10. PREGNANCY: "Is there any chance you are pregnant?" "When was your last menstrual period?"       Not asked 11. TRAVEL: "Have you traveled out of the country in the last month?" (e.g., travel history, exposures)       Not travels or exposures to COVID-19.  Protocols used: BREATHING DIFFICULTY-A-AH

## 2018-08-22 NOTE — Telephone Encounter (Signed)
Please advise 

## 2018-08-22 NOTE — Telephone Encounter (Signed)
Patient is calling to check status of medication albuterol.  Patient does have asthma and is concerned   Patient was transferred to triage nurse 5/21 Patient states she was told by triage nurse that she needs to be prescribed again  predniSONE (DELTASONE) 20 MG tablet If breathing did not get any better.  Please send to different pharmacy  [id number] [place], [place] - [address] [phone] (Phone) [phone] (Fax)     Patient Call back #  [phone]

## 2018-08-23 MED ORDER — PREDNISONE 20 MG PO TABS: 40.0000 mg | ORAL_TABLET | Freq: Every day | ORAL | 0 refills | Status: DC

## 2018-08-23 MED ORDER — ALBUTEROL SULFATE (2.5 MG/3ML) 0.083% IN NEBU: 2.5000 mg | INHALATION_SOLUTION | Freq: Four times a day (QID) | RESPIRATORY_TRACT | 1 refills | Status: DC | PRN

## 2018-08-23 NOTE — Telephone Encounter (Signed)
Patient's concern/request has been addressed 

## 2018-08-23 NOTE — Addendum Note (Signed)
Addended by: Myles Lipps on: 08/23/2018 05:58 PM   Modules accepted: Orders

## 2018-08-23 NOTE — Telephone Encounter (Signed)
meds sent to [facility]

## 2018-09-07 ENCOUNTER — Other Ambulatory Visit: Payer: Self-pay | Admitting: Family Medicine

## 2018-09-07 NOTE — Telephone Encounter (Signed)
Patient called and advised she will need to schedule an OV before refilling medication, she verbalized understanding. I asked if she will call the office after 1 pm to schedule due to the office is closed for lunch at this time, she says she will call back.

## 2018-09-07 NOTE — Telephone Encounter (Signed)
Requested medication (s) are due for refill today: Yes  Requested medication (s) are on the active medication list: yes  Last refill:  08/14/18  Future visit scheduled: No, patient advised to call for appointment  Notes to clinic:  Unable to refill per protocol, failed items.     Requested Prescriptions  Pending Prescriptions Disp Refills   DULoxetine (CYMBALTA) 30 MG capsule [Pharmacy Med Name: DULOXETINE HCL DR 30 MG CAP] 90 capsule 1    Sig: TAKE 1 CAPSULE BY MOUTH EVERY DAY     Psychiatry: Antidepressants - SNRI Failed - 09/07/2018 12:54 PM      Failed - Last BP in normal range    BP Readings from Last 1 Encounters:  12/09/17 (!) 150/107         Failed - Valid encounter within last 6 months    Recent Outpatient Visits          1 year ago [place] [facility], [place] C, MD   6 years ago Asthma attack   [facility], [place] L, DO             Failed - Completed PHQ-2 or PHQ-9 in the last 360 days.       buPROPion (WELLBUTRIN XL) 150 MG 24 hr tablet [Pharmacy Med Name: BUPROPION HCL XL 150 MG TABLET] 90 tablet 1    Sig: TAKE 1 TABLET BY MOUTH EVERY DAY     Psychiatry: Antidepressants - bupropion Failed - 09/07/2018 12:54 PM      Failed - Last BP in normal range    BP Readings from Last 1 Encounters:  12/09/17 (!) 150/107         Failed - Valid encounter within last 6 months    Recent Outpatient Visits          1 year ago [place] [facility], [place] C, MD   6 years ago Asthma attack   [facility], [place] L, DO             Failed - Completed PHQ-2 or PHQ-9 in the last 360 days.

## 2018-09-11 NOTE — Telephone Encounter (Signed)
Patient is requesting a refill of the following medications: Requested Prescriptions   Pending Prescriptions Disp Refills  . DULoxetine (CYMBALTA) 30 MG capsule [Pharmacy Med Name: DULOXETINE HCL DR 30 MG CAP] 90 capsule 1    Sig: TAKE 1 CAPSULE BY MOUTH EVERY DAY  . buPROPion (WELLBUTRIN XL) 150 MG 24 hr tablet [Pharmacy Med Name: BUPROPION HCL XL 150 MG TABLET] 90 tablet 1    Sig: TAKE 1 TABLET BY MOUTH EVERY DAY    Date of patient request 08/17/18 Last office visit: 08/14/18 Date of last refill: 08/14/18 Last refill amount:0 Follow up time period per chart:n/a  Please Advise

## 2018-09-12 NOTE — Telephone Encounter (Signed)
Had telemedicine visit a month ago Plan was to titrate medications back to previous doses on which she her fibromyalgia and anxiety were controlled. Needs office visit prior to next refills.

## 2018-09-13 ENCOUNTER — Telehealth: Payer: Self-pay | Admitting: Family Medicine

## 2018-09-13 NOTE — Telephone Encounter (Signed)
lvm

## 2018-09-27 ENCOUNTER — Other Ambulatory Visit: Payer: Self-pay | Admitting: Family Medicine

## 2018-09-27 NOTE — Telephone Encounter (Signed)
Forwarding medication refill to PCP for refill.

## 2018-10-04 ENCOUNTER — Other Ambulatory Visit: Payer: Self-pay

## 2018-10-04 ENCOUNTER — Encounter: Payer: Self-pay | Admitting: Family Medicine

## 2018-10-04 ENCOUNTER — Ambulatory Visit (INDEPENDENT_AMBULATORY_CARE_PROVIDER_SITE_OTHER): Payer: BC Managed Care – PPO | Admitting: Family Medicine

## 2018-10-04 VITALS — BP 150/108 | HR 57 | Temp 98.6°F | Resp 16 | Wt 326.0 lb

## 2018-10-04 DIAGNOSIS — Z6841 Body Mass Index (BMI) 40.0 and over, adult: Secondary | ICD-10-CM

## 2018-10-04 DIAGNOSIS — R0683 Snoring: Secondary | ICD-10-CM

## 2018-10-04 DIAGNOSIS — I10 Essential (primary) hypertension: Secondary | ICD-10-CM

## 2018-10-04 DIAGNOSIS — J4541 Moderate persistent asthma with (acute) exacerbation: Secondary | ICD-10-CM | POA: Diagnosis not present

## 2018-10-04 MED ORDER — FLOVENT HFA 110 MCG/ACT IN AERO: 2.0000 | INHALATION_SPRAY | Freq: Two times a day (BID) | RESPIRATORY_TRACT | 5 refills | Status: DC

## 2018-10-04 MED ORDER — OMEPRAZOLE 40 MG PO CPDR: 40.0000 mg | DELAYED_RELEASE_CAPSULE | Freq: Every day | ORAL | 3 refills | Status: DC

## 2018-10-04 NOTE — Patient Instructions (Addendum)
     If you have lab work done today you will be contacted with your lab results within the next 2 weeks.  If you have not heard from [place] then please contact us. The fastest way to get your results is to register for My Chart. Omeprazole  Sleep specialist Increase flovent to 2 puffs twice a day after using albuterol Pt can use albuterol prior to flovent Allergy referral  IF you received an x-ray today, you will receive an invoice from [facility]. Please contact [facility] at [phone] with questions or concerns regarding your invoice.   IF you received labwork today, you will receive an invoice from [place]. Please contact [facility] at [phone] with questions or concerns regarding your invoice.   Our billing staff will not be able to assist you with questions regarding bills from these companies.  You will be contacted with the lab results as soon as they are available. The fastest way to get your results is to activate your My Chart account. Instructions are located on the last page of this paperwork. If you have not heard from [place] regarding the results in 2 weeks, please contact this office.

## 2018-10-04 NOTE — Progress Notes (Signed)
Acute Office Visit  Subjective:    Patient ID: Samantha Romero, female    DOB: [birth year], 43 y.o.   MRN: [id number]  Chief Complaint  Patient presents with  . Asthma    pt states she has been having issues with her asthma x 1 month with shob, not being able to catch her breath. Pt states she when she sleeps she has to elavate herself because the shob is causing her to be restless   . Hypertension    pt states she was given B/P medication but have not start taking the medication     HPI Patient is in today for snoring-concern for sleep apnea-wants referral for sleep evaluation-feels worsens anxiety and asthma symptoms  Pt with asthma and allergies-struggling with upper airway concerns-bronchitis in Dec-pt did not get medical care due to NO insurance-pt used albuterol and mucinex for treatment-improved slowly. Chest congestion improved but SOB continued-SOB walking to the car and going up and down stairs continued to be a problem in Jan. 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  2019 pt lost most of most of belongings and took 2 jobs to try to restore finances. Pt states both jobs demanding.  Pt states with schedules and working downtown-increase weight of 40lbs between Jan and today. Pt states flovent and singulair started in April 2020 for asthma symptoms but in May she needed prednisone. Pt states symptoms improved but now worsening again. Pt does not feel albuterol nebulized works as well as MDI.  Pt with concern for weight gain due to COVID and 2 jobs-states she often goes from one job to another with little time to cook or exercise.  Pt request anxiety medication  Past Medical History:  Diagnosis Date  . Anxiety    panic attacks  . Asthma   . Depression   . Fibromyalgia   . Hypertension    no meds currently  . No pertinent past medical history   . Pneumonia    hx of   . Sinus  problem     Past Surgical History:  Procedure Laterality Date  . ESOPHAGOGASTRODUODENOSCOPY N/A 11/26/2014   Procedure: ESOPHAGOGASTRODUODENOSCOPY (EGD);  Surgeon: Alphonsa Overall, MD;  Location: [facility];  Service: General;  Laterality: N/A;  . GASTRIC BANDING PORT REVISION  08/24/11  . LAPAROSCOPIC GASTRIC BANDING  12/26/2007  . LAPAROSCOPIC LYSIS OF ADHESIONS N/A 01/06/2015   Procedure: LAPAROSCOPIC LYSIS OF ADHESIONS;  Surgeon: Johnathan Hausen, MD;  Location: [facility];  Service: General;  Laterality: N/A;  . LAPAROSCOPIC REPAIR AND REMOVAL OF GASTRIC BAND N/A 01/06/2015   Procedure: LAPAROSCOPIC RESIGHTING OF GASTRIC Band PORT;  Surgeon: Johnathan Hausen, MD;  Location: [facility];  Service: General;  Laterality: N/A;  . NO PAST SURGERIES      Family History  Problem Relation Age of Onset  . Cancer Maternal Uncle        lung  . Cancer Maternal Grandfather        lung and back    Social History   Socioeconomic History  . Marital status: Single    Spouse name: Not on file  . Number of children: Not on file  . Years of education: Not on file  . Highest education level: Not on file  Occupational History  . Not on file  Social Needs  . Financial resource strain: Not on file  . Food insecurity    Worry: Not on file  Inability: Not on file  . Transportation needs    Medical: Not on file    Non-medical: Not on file  Tobacco Use  . Smoking status: Never Smoker  . Smokeless tobacco: Never Used  Substance and Sexual Activity  . Alcohol use: Yes    Comment: socially  . Drug use: Never  . Sexual activity: Not Currently    Birth control/protection: I.U.D.    Comment: Mirena  Lifestyle  . Physical activity    Days per week: Not on file    Minutes per session: Not on file  . Stress: Not on file  Relationships  . Social Musician on phone: Not on file    Gets together: Not on file    Attends religious service: Not on file    Active member of club or organization:  Not on file    Attends meetings of clubs or organizations: Not on file    Relationship status: Not on file  . Intimate partner violence    Fear of current or ex partner: Not on file    Emotionally abused: Not on file    Physically abused: Not on file    Forced sexual activity: Not on file  Other Topics Concern  . Not on file  Social History Narrative  . Not on file    Outpatient Medications Prior to Visit  Medication Sig Dispense Refill  . albuterol (PROAIR HFA) 108 (90 BASE) MCG/ACT inhaler Inhale 2 puffs into the lungs every 6 (six) hours as needed for wheezing or shortness of breath.    Marland Kitchen albuterol (PROVENTIL) (2.5 MG/3ML) 0.083% nebulizer solution Take 3 mLs (2.5 mg total) by nebulization every 6 (six) hours as needed for wheezing or shortness of breath. 150 mL 1  . ALPRAZolam (XANAX) 1 MG tablet Take 1 tablet (1 mg total) by mouth 3 (three) times daily as needed for anxiety. 40 tablet 0  . amLODipine (NORVASC) 5 MG tablet Take 1 tablet (5 mg total) by mouth daily. 90 tablet 0  . buPROPion (WELLBUTRIN XL) 300 MG 24 hr tablet Take 1 tablet (300 mg total) by mouth daily. Needs office visit prior to next refill 30 tablet 0  . cetirizine (ZYRTEC) 10 MG tablet Take 10 mg by mouth daily with breakfast.    . DULoxetine (CYMBALTA) 60 MG capsule TAKE 1 CAPSULE (60 MG TOTAL) BY MOUTH DAILY. NEEDS OFFICE VISIT PRIOR TO NEXT REFILL 90 capsule 0  . fluticasone (FLONASE) 50 MCG/ACT nasal spray Place 1 spray into both nostrils every morning.    . fluticasone (FLOVENT HFA) 110 MCG/ACT inhaler Inhale 1 puff into the lungs 2 (two) times daily. 1 Inhaler 2  . montelukast (SINGULAIR) 10 MG tablet Take 1 tablet (10 mg total) by mouth at bedtime. 30 tablet 5  . phentermine (ADIPEX-P) 37.5 MG tablet Take 37.5 mg by mouth daily.    . predniSONE (DELTASONE) 20 MG tablet Take 2 tablets (40 mg total) by mouth daily with breakfast. 10 tablet 0   No facility-administered medications prior to visit.      Allergies  Allergen Reactions  . Grass Extracts [Gramineae Pollens]     Nasal congestion, post nasal drip   . Shellfish Allergy Itching    In throat only.    Review of Systems  Constitutional: Positive for malaise/fatigue. Negative for fever.  Respiratory: Positive for cough and shortness of breath.   Gastrointestinal: Negative for abdominal pain and nausea.  Psychiatric/Behavioral: The patient is nervous/anxious.  Objective:    Physical Exam  Constitutional: She appears well-developed and well-nourished. No distress.  Cardiovascular: Normal rate, regular rhythm and normal heart sounds.  Pulmonary/Chest: Effort normal and breath sounds normal.  Neurological: She is alert.  Psychiatric: She has a normal mood and affect.    BP (!) 150/108   Pulse (!) 57   Temp 98.6 F (37 C) (Oral)   Resp 16   Wt (!) 326 lb (147.9 kg)   SpO2 93%   BMI 59.63 kg/m  Wt Readings from Last 3 Encounters:  10/04/18 (!) 326 lb (147.9 kg)  12/09/17 (!) 310 lb (140.6 kg)  12/01/17 (!) 310 lb (140.6 kg)    Health Maintenance Due  Topic Date Due  . TETANUS/TDAP  03/03/1996    No results found for: TSH Lab Results  Component Value Date   WBC 6.2 12/01/2017   HGB 12.7 12/01/2017   HCT 38.4 12/01/2017   MCV 85.1 12/01/2017   PLT 257 12/01/2017   Lab Results  Component Value Date   NA 143 12/01/2017   K 3.6 12/01/2017   CO2 26 12/01/2017   GLUCOSE 98 12/01/2017   BUN 9 12/01/2017   CREATININE 0.73 12/01/2017   BILITOT 0.4 12/24/2014   ALKPHOS 82 12/24/2014   AST 20 12/24/2014   ALT 15 12/24/2014   PROT 7.3 12/24/2014   ALBUMIN 4.1 12/24/2014   CALCIUM 9.1 12/01/2017   ANIONGAP 10 12/01/2017   Assessment & Plan:  1. Snoring - Ambulatory referral to Sleep Studies  2. Moderate persistent asthma with acute exacerbation Increase dose of Flovent to 2 puffs twice a day as asthma not well controlled Pt to use albuterol prior to flovent BID , pt to continue taking singulair.  Pt to go to ER if symptoms cause SOB or other concerns-d/w pt at length need to control symptoms-pt having cleaning completed on her home, will exercise in controlled environment in her home. - Ambulatory referral to Allergy-asthma not well controlled-needs PFT  3. Essential hypertension Continue amlodipine-may need to increase dose if bp elevated when asthma controlled  4. Class 3 severe obesity due to excess calories without serious comorbidity with body mass index (BMI) of 45.0 to 49.9 in adult (HCC) Weight gain noted-pt and I discussed oral prednisone and weight gain. Exercise suggested when asthma controlled using in home machine for safety and environmental control.   Pt request anxiety medication-advised to schedule appt for this concern to be addressed with Dr. Leretha PolSantiago as refills for anxiety medication since controlled substance with careful monitoring  Mat CarneLEIGH , MD

## 2018-10-05 ENCOUNTER — Observation Stay (HOSPITAL_COMMUNITY)
Admission: EM | Admit: 2018-10-05 | Discharge: 2018-10-07 | Disposition: A | Discharge status: Home / Self Care | Payer: BC Managed Care – PPO | Attending: Family Medicine | Admitting: Family Medicine

## 2018-10-05 ENCOUNTER — Emergency Department (HOSPITAL_COMMUNITY): Payer: BC Managed Care – PPO

## 2018-10-05 ENCOUNTER — Other Ambulatory Visit: Payer: Self-pay

## 2018-10-05 ENCOUNTER — Encounter (HOSPITAL_COMMUNITY): Payer: Self-pay

## 2018-10-05 DIAGNOSIS — F4323 Adjustment disorder with mixed anxiety and depressed mood: Secondary | ICD-10-CM

## 2018-10-05 DIAGNOSIS — Z79899 Other long term (current) drug therapy: Secondary | ICD-10-CM | POA: Diagnosis not present

## 2018-10-05 DIAGNOSIS — Z7951 Long term (current) use of inhaled steroids: Secondary | ICD-10-CM | POA: Diagnosis not present

## 2018-10-05 DIAGNOSIS — J45909 Unspecified asthma, uncomplicated: Secondary | ICD-10-CM | POA: Diagnosis not present

## 2018-10-05 DIAGNOSIS — K219 Gastro-esophageal reflux disease without esophagitis: Secondary | ICD-10-CM | POA: Diagnosis not present

## 2018-10-05 DIAGNOSIS — I160 Hypertensive urgency: Principal | ICD-10-CM | POA: Diagnosis present

## 2018-10-05 DIAGNOSIS — I110 Hypertensive heart disease with heart failure: Secondary | ICD-10-CM | POA: Diagnosis not present

## 2018-10-05 DIAGNOSIS — Z6841 Body Mass Index (BMI) 40.0 and over, adult: Secondary | ICD-10-CM | POA: Diagnosis not present

## 2018-10-05 DIAGNOSIS — M797 Fibromyalgia: Secondary | ICD-10-CM | POA: Insufficient documentation

## 2018-10-05 DIAGNOSIS — J45901 Unspecified asthma with (acute) exacerbation: Secondary | ICD-10-CM

## 2018-10-05 DIAGNOSIS — I5022 Chronic systolic (congestive) heart failure: Secondary | ICD-10-CM | POA: Insufficient documentation

## 2018-10-05 DIAGNOSIS — F418 Other specified anxiety disorders: Secondary | ICD-10-CM | POA: Diagnosis present

## 2018-10-05 DIAGNOSIS — R0602 Shortness of breath: Secondary | ICD-10-CM | POA: Diagnosis present

## 2018-10-05 DIAGNOSIS — Z1159 Encounter for screening for other viral diseases: Secondary | ICD-10-CM | POA: Diagnosis not present

## 2018-10-05 DIAGNOSIS — I517 Cardiomegaly: Secondary | ICD-10-CM | POA: Diagnosis present

## 2018-10-05 DIAGNOSIS — Z9114 Patient's other noncompliance with medication regimen: Secondary | ICD-10-CM | POA: Insufficient documentation

## 2018-10-05 DIAGNOSIS — I10 Essential (primary) hypertension: Secondary | ICD-10-CM | POA: Diagnosis present

## 2018-10-05 DIAGNOSIS — I5021 Acute systolic (congestive) heart failure: Secondary | ICD-10-CM | POA: Diagnosis present

## 2018-10-05 LAB — CBC WITH DIFFERENTIAL/PLATELET
Abs Immature Granulocytes: 0.04 10*3/uL (ref 0.00–0.07)
Basophils Absolute: 0 10*3/uL (ref 0.0–0.1)
Basophils Relative: 0 %
Eosinophils Absolute: 0.2 10*3/uL (ref 0.0–0.5)
Eosinophils Relative: 2 %
HCT: 41 % (ref 36.0–46.0)
Hemoglobin: 12.7 g/dL (ref 12.0–15.0)
Immature Granulocytes: 0 %
Lymphocytes Relative: 26 %
Lymphs Abs: 2.4 10*3/uL (ref 0.7–4.0)
MCH: 27.7 pg (ref 26.0–34.0)
MCHC: 31 g/dL (ref 30.0–36.0)
MCV: 89.5 fL (ref 80.0–100.0)
Monocytes Absolute: 0.6 10*3/uL (ref 0.1–1.0)
Monocytes Relative: 7 %
Neutro Abs: 6 10*3/uL (ref 1.7–7.7)
Neutrophils Relative %: 65 %
Platelets: 211 10*3/uL (ref 150–400)
RBC: 4.58 MIL/uL (ref 3.87–5.11)
RDW: 13.5 % (ref 11.5–15.5)
WBC: 9.3 10*3/uL (ref 4.0–10.5)
nRBC: 0 % (ref 0.0–0.2)

## 2018-10-05 LAB — BASIC METABOLIC PANEL
Anion gap: 10 (ref 5–15)
BUN: 15 mg/dL (ref 6–20)
CO2: 23 mmol/L (ref 22–32)
Calcium: 8.6 mg/dL — ABNORMAL LOW (ref 8.9–10.3)
Chloride: 105 mmol/L (ref 98–111)
Creatinine, Ser: 0.8 mg/dL (ref 0.44–1.00)
GFR calc Af Amer: >60 mL/min (ref >60)
GFR calc non Af Amer: >60 mL/min (ref >60)
Glucose, Bld: 110 mg/dL — ABNORMAL HIGH (ref 70–99)
Potassium: 3.9 mmol/L (ref 3.5–5.1)
Sodium: 138 mmol/L (ref 135–145)

## 2018-10-05 LAB — TROPONIN I (HIGH SENSITIVITY): Troponin I (High Sensitivity): 14 ng/L (ref <18)

## 2018-10-05 LAB — BRAIN NATRIURETIC PEPTIDE: B Natriuretic Peptide: 86.2 pg/mL (ref 0.0–100.0)

## 2018-10-05 LAB — D-DIMER, QUANTITATIVE: D-Dimer, Quant: 0.64 ug/mL-FEU — ABNORMAL HIGH (ref 0.00–0.50)

## 2018-10-05 LAB — SARS CORONAVIRUS 2 BY RT PCR (HOSPITAL ORDER, PERFORMED IN ~~LOC~~ HOSPITAL LAB): SARS Coronavirus 2: NEGATIVE

## 2018-10-05 MED ORDER — SODIUM CHLORIDE 0.9 % IV BOLUS
1000.0000 mL | Freq: Once | INTRAVENOUS | Status: AC
  Administered 2018-10-05: 1000 mL via INTRAVENOUS

## 2018-10-05 MED ORDER — ENALAPRILAT 1.25 MG/ML IV SOLN
1.2500 mg | Freq: Once | INTRAVENOUS | Status: AC
  Administered 2018-10-05: 1.25 mg via INTRAVENOUS
  Filled 2018-10-05: qty 1

## 2018-10-05 MED ORDER — BUPROPION HCL ER (XL) 300 MG PO TB24: 300.0000 mg | ORAL_TABLET | Freq: Every day | ORAL | 0 refills | Status: DC

## 2018-10-05 MED ORDER — NITROGLYCERIN 0.4 MG SL SUBL
1.2000 mg | SUBLINGUAL_TABLET | Freq: Once | SUBLINGUAL | Status: AC
  Administered 2018-10-05: 1.2 mg via SUBLINGUAL
  Filled 2018-10-05: qty 3

## 2018-10-05 MED ORDER — AEROCHAMBER Z-STAT PLUS/MEDIUM MISC
1.0000 | Freq: Once | Status: AC
  Administered 2018-10-05: 1
  Filled 2018-10-05: qty 1

## 2018-10-05 MED ORDER — IOHEXOL 350 MG/ML SOLN
100.0000 mL | Freq: Once | INTRAVENOUS | Status: AC | PRN
  Administered 2018-10-05: 100 mL via INTRAVENOUS

## 2018-10-05 MED ORDER — METHYLPREDNISOLONE SODIUM SUCC 125 MG IJ SOLR
125.0000 mg | Freq: Once | INTRAMUSCULAR | Status: AC
  Administered 2018-10-05: 125 mg via INTRAVENOUS
  Filled 2018-10-05: qty 2

## 2018-10-05 MED ORDER — ALBUTEROL SULFATE HFA 108 (90 BASE) MCG/ACT IN AERS
4.0000 | INHALATION_SPRAY | Freq: Once | RESPIRATORY_TRACT | Status: AC
  Administered 2018-10-05: 4 via RESPIRATORY_TRACT
  Filled 2018-10-05: qty 6.7

## 2018-10-05 MED ORDER — AMLODIPINE BESYLATE 5 MG PO TABS
5.0000 mg | ORAL_TABLET | Freq: Once | ORAL | Status: AC
  Administered 2018-10-05: 5 mg via ORAL
  Filled 2018-10-05: qty 1

## 2018-10-05 MED ORDER — AEROCHAMBER PLUS FLO-VU MISC: 1.0000 | Freq: Once | Status: DC

## 2018-10-05 MED ORDER — SODIUM CHLORIDE (PF) 0.9 % IJ SOLN
INTRAMUSCULAR | Status: AC
  Filled 2018-10-05: qty 50

## 2018-10-05 NOTE — ED Triage Notes (Signed)
Patienat reports that she has had increased asthma/wheezing. ptaient states she went to her PCP yesterday and was told to increase Albuterol puffs and Albuterol nebs which she did, with no relief.  Patient is hypertensive in triage and states she has not had her BP meds today.

## 2018-10-05 NOTE — ED Notes (Signed)
This RN has tried x2 to get IV and bloodwork without success

## 2018-10-05 NOTE — ED Provider Notes (Signed)
[facility] DEPT Provider Note   CSN: [id number] Arrival date & time: 10/05/18  1627    History   Chief Complaint Chief Complaint  Patient presents with  . Asthma  . Hypertension    HPI Samantha Romero is a 43 y.o. female.     Samantha Romero is a 43 y.o. female with history of asthma, hypertension, fibromyalgia, anxiety and depression, who presents to the ED for evaluation of shortness of breath with concern for worsening asthma and wheezing as well as elevated blood pressure.  Patient reports that she was seen by her primary care doctor for evaluation of worsening asthma and was told to increase her use of her albuterol inhaler but reports her shortness of breath has persisted despite this.  She reports that she feels like her breathing is labored.  She denies any associated chest pain.  No cough or fever.  No lower extremity swelling or pain.  Patient reports that since December she has been dealing with intermittent issues with her asthma and it has been more difficult to control, she was referred to pulmonologist for further evaluation yesterday but presents today due to worsening symptoms.  She reports feeling persistently short of breath despite using her inhaler, this is worse when she is up walking around.  Patient also notes that her blood pressure is high, on arrival 173/132, reports that she is prescribed amlodipine for blood pressure but has not taken it in > 1 month.  She reports she has been focused on treating her asthma and so has stopped taking her blood pressure medication.  Again she denies chest pain, lower extremity swelling.  She also denies any headaches, vision changes, numbness weakness or dizziness.     Past Medical History:  Diagnosis Date  . Anxiety    panic attacks  . Asthma   . Depression   . Fibromyalgia   . Hypertension    no meds currently  . No pertinent past medical history   . Pneumonia    hx of   . Sinus problem      Patient Active Problem List   Diagnosis Date Noted  . Snoring 10/04/2018  . Essential hypertension 08/14/2018  . Moderate persistent asthma with acute exacerbation 08/14/2018  . Fibromyalgia 11/18/2016  . Class 3 severe obesity due to excess calories without serious comorbidity with body mass index (BMI) of 45.0 to 49.9 in adult ([place]) 11/18/2016  . Adjustment reaction with anxiety and depression 11/18/2016  . Lapband port leaking-REVISED May 2013 09/10/2011  . 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 ESOPHAGOGASTRODUODENOSCOPY N/A 11/26/2014   Procedure: ESOPHAGOGASTRODUODENOSCOPY (EGD);  Surgeon: Alphonsa Overall, MD;  Location: [facility];  Service: General;  Laterality: N/A;  . GASTRIC BANDING PORT REVISION  08/24/11  . LAPAROSCOPIC GASTRIC BANDING  12/26/2007  . LAPAROSCOPIC LYSIS OF ADHESIONS N/A 01/06/2015   Procedure: LAPAROSCOPIC LYSIS OF ADHESIONS;  Surgeon: Johnathan Hausen, MD;  Location: [facility];  Service: General;  Laterality: N/A;  . LAPAROSCOPIC REPAIR AND REMOVAL OF GASTRIC BAND N/A 01/06/2015   Procedure: LAPAROSCOPIC RESIGHTING OF GASTRIC Band PORT;  Surgeon: Johnathan Hausen, MD;  Location: [facility];  Service: General;  Laterality: N/A;  . NO PAST SURGERIES       OB History   No obstetric history on file.      Home Medications    Prior to Admission medications   Medication Sig Start Date End Date Taking?  Authorizing Provider  albuterol (PROAIR HFA) 108 (90 Base) MCG/ACT inhaler Inhale 2 puffs into the lungs every 6 (six) hours as needed for wheezing or shortness of breath.    Yes [provider]  albuterol (PROVENTIL) (2.5 MG/3ML) 0.083% nebulizer solution Take 3 mLs (2.5 mg total) by nebulization every 6 (six) hours as needed for wheezing or shortness of breath. 08/23/18  Yes Myles LippsSantiago, Irma M, MD  ALPRAZolam Prudy Feeler(XANAX) 1 MG tablet Take 1 tablet (1 mg total) by mouth 3 (three) times daily as needed for anxiety.  08/14/18  Yes Myles LippsSantiago, Irma M, MD  amLODipine (NORVASC) 5 MG tablet Take 1 tablet (5 mg total) by mouth daily. 08/14/18  Yes Myles LippsSantiago, Irma M, MD  cetirizine (ZYRTEC) 10 MG tablet Take 10 mg by mouth daily with breakfast.   Yes [provider]  DULoxetine (CYMBALTA) 60 MG capsule TAKE 1 CAPSULE (60 MG TOTAL) BY MOUTH DAILY. NEEDS OFFICE VISIT PRIOR TO NEXT REFILL 09/28/18  Yes Myles LippsSantiago, Irma M, MD  fluticasone [facility](FLONASE) 50 MCG/ACT nasal spray Place 1 spray into both nostrils every morning.   Yes [provider]  fluticasone (FLOVENT HFA) 110 MCG/ACT inhaler Inhale 2 puffs into the lungs 2 (two) times daily. 10/04/18  Yes Corum, Minerva FesterLisa L, MD  montelukast (SINGULAIR) 10 MG tablet Take 1 tablet (10 mg total) by mouth at bedtime. 08/14/18  Yes Myles LippsSantiago, Irma M, MD  phentermine (ADIPEX-P) 37.5 MG tablet Take 37.5 mg by mouth daily.   Yes [provider]  buPROPion (WELLBUTRIN XL) 300 MG 24 hr tablet Take 1 tablet (300 mg total) by mouth daily. Needs office visit prior to next refill 10/05/18   Myles LippsSantiago, Irma M, MD  omeprazole (PRILOSEC) 40 MG capsule Take 1 capsule (40 mg total) by mouth daily. 10/04/18   Corum, Minerva FesterLisa L, MD    Family History Family History  Problem Relation Age of Onset  . Cancer Maternal Uncle        lung  . Cancer Maternal Grandfather        lung and back    Social History Social History   Tobacco Use  . Smoking status: Never Smoker  . Smokeless tobacco: Never Used  Substance Use Topics  . Alcohol use: Yes    Comment: socially  . Drug use: Never     Allergies   Grass extracts [gramineae pollens] and Shellfish allergy   Review of Systems Review of Systems  Constitutional: Negative for chills and fever.  HENT: Negative.   Eyes: Negative for visual disturbance.  Respiratory: Positive for chest tightness, shortness of breath and wheezing. Negative for cough.   Cardiovascular: Negative for chest pain, palpitations and leg swelling.   Gastrointestinal: Negative for abdominal pain, nausea and vomiting.  Genitourinary: Negative for dysuria.  Musculoskeletal: Negative for arthralgias, back pain and myalgias.  Skin: Negative for color change and rash.  Neurological: Negative for dizziness, weakness, light-headedness, numbness and headaches.     Physical Exam Updated Vital Signs BP (!) 173/132 (BP Location: Left Arm) Comment: BP taken x 2.  Pulse (!) 118   Temp 98.2 F (36.8 C) (Oral)   Ht 5\' 2"  (1.575 m)   Wt (!) 147.9 kg   SpO2 95%   BMI 59.63 kg/m   Physical Exam Vitals signs and nursing note reviewed.  Constitutional:      General: She is not in acute distress.    Appearance: Normal appearance. She is well-developed. She is obese. She is not ill-appearing or diaphoretic.  HENT:  Head: Normocephalic and atraumatic.     Nose: Nose normal.     Mouth/Throat:     Mouth: Mucous membranes are moist.     Pharynx: Oropharynx is clear.  Eyes:     General:        Right eye: No discharge.        Left eye: No discharge.     Pupils: Pupils are equal, round, and reactive to light.  Neck:     Musculoskeletal: Neck supple.  Cardiovascular:     Rate and Rhythm: Regular rhythm. Tachycardia present.     Heart sounds: Normal heart sounds. No murmur. No friction rub. No gallop.      Comments: Tachycardic at 118 but with regular rhythm, no murmurs auscultated Pulmonary:     Effort: No respiratory distress.     Breath sounds: Normal breath sounds. No wheezing or rales.     Comments: Patient tachypneic, but with normal respiratory effort, satting 95% on room air, lungs clear to auscultation on exam without wheezes, rhonchi or rales, slightly decreased air movement. Abdominal:     General: Bowel sounds are normal. There is no distension.     Palpations: Abdomen is soft. There is no mass.     Tenderness: There is no abdominal tenderness. There is no guarding.     Comments: Abdomen soft, nondistended, nontender to  palpation in all quadrants without guarding or peritoneal signs  Musculoskeletal:        General: No deformity.     Right lower leg: No edema.     Left lower leg: No edema.     Comments: Bilateral lower extremities warm and well perfused without edema  Skin:    General: Skin is warm and dry.     Capillary Refill: Capillary refill takes less than 2 seconds.  Neurological:     Mental Status: She is alert.     Coordination: Coordination normal.     Comments: Speech is clear, able to follow commands Moves extremities without ataxia, coordination intact  Psychiatric:        Mood and Affect: Mood normal.        Behavior: Behavior normal.      ED Treatments / Results  Labs (all labs ordered are listed, but only abnormal results are displayed) Labs Reviewed  BASIC METABOLIC PANEL - Abnormal; Notable for the following components:      Result Value   Glucose, Bld 110 (*)    Calcium 8.6 (*)    All other components within normal limits  D-DIMER, QUANTITATIVE (NOT [facility]) - Abnormal; Notable for the following components:   D-Dimer, Quant 0.64 (*)    All other components within normal limits  SARS CORONAVIRUS 2 (HOSPITAL ORDER, PERFORMED IN [facility])  CBC WITH DIFFERENTIAL/PLATELET  BRAIN NATRIURETIC PEPTIDE  TSH  T3  T4  TROPONIN I (HIGH SENSITIVITY)  TROPONIN I (HIGH SENSITIVITY)    EKG EKG Interpretation  Date/Time:  Thursday October 05 2018 20:12:53 EDT Ventricular Rate:  124 PR Interval:    QRS Duration: 92 QT Interval:  336 QTC Calculation: 483 R Axis:   64 Text Interpretation:  Sinus tachycardia Borderline repol abnrm, anterolateral leads No significant change since last tracing Confirmed by Melene PlanFloyd, Dan [id number]) on 10/05/2018 9:07:59 PM   Radiology Ct Angio Chest Pe W And/or Wo Contrast  Result Date: 10/05/2018 CLINICAL DATA:  PE suspected EXAM: CT ANGIOGRAPHY CHEST WITH CONTRAST TECHNIQUE: Multidetector CT imaging of the chest was performed using the  standard protocol during bolus administration of intravenous contrast. Multiplanar CT image reconstructions and MIPs were obtained to evaluate the vascular anatomy. CONTRAST:  100mL OMNIPAQUE IOHEXOL 350 MG/ML SOLN COMPARISON:  None. FINDINGS: Cardiovascular: Contrast injection is sufficient to demonstrate satisfactory opacification of the pulmonary arteries to the segmental level. There is no pulmonary embolus. The main pulmonary artery is within normal limits for size. There is no CT evidence of acute right heart strain. The visualized aorta is normal. Heart size is enlarged. Mediastinum/Nodes: --No mediastinal or hilar lymphadenopathy. --No axillary lymphadenopathy. --No supraclavicular lymphadenopathy. --Normal thyroid gland. --The esophagus is unremarkable Lungs/Pleura: There is elevation of the right hemidiaphragm with compressive atelectasis involving the right lung base. There is no pneumothorax. No large pleural effusion. The trachea is unremarkable. Upper Abdomen: A lap band is in place. There is no acute abnormality detected in the partially visualized upper abdomen. Musculoskeletal: No chest wall abnormality. No acute or significant osseous findings. Review of the MIP images confirms the above findings. IMPRESSION: 1. No PE. 2. Cardiomegaly. 3. Elevation of the right hemidiaphragm with compressive atelectasis at the right lung base. Electronically Signed   By: Katherine Mantlehristopher  Green M.D.   On: 10/05/2018 23:41   Dg Chest Port 1 View  Result Date: 10/05/2018 CLINICAL DATA:  Increasing wheezing and shortness of breath EXAM: PORTABLE CHEST 1 VIEW COMPARISON:  12/16/2014 FINDINGS: Cardiac shadow is enlarged but accentuated by the portable technique. Mild vascular congestion is noted without interstitial edema. No definitive infiltrate is seen. No bony abnormality is noted. IMPRESSION: Mild vascular congestion without interstitial edema. Electronically Signed   By: Alcide CleverMark  Lukens M.D.   On: 10/05/2018 19:18     Procedures Procedures (including critical care time)  Medications Ordered in ED Medications  sodium chloride (PF) 0.9 % injection (has no administration in time range)  furosemide (LASIX) injection 20 mg (has no administration in time range)  albuterol (VENTOLIN HFA) 108 (90 Base) MCG/ACT inhaler 4 puff (4 puffs Inhalation Given 10/05/18 2000)  methylPREDNISolone sodium succinate (SOLU-MEDROL) 125 mg/2 mL injection 125 mg (125 mg Intravenous Given 10/05/18 2030)  sodium chloride 0.9 % bolus 1,000 mL (0 mLs Intravenous Stopped 10/05/18 2104)  aerochamber Z-Stat Plus/medium 1 each (1 each Other Given 10/05/18 2000)  amLODipine (NORVASC) tablet 5 mg (5 mg Oral Given 10/05/18 2056)  nitroGLYCERIN (NITROSTAT) SL tablet 1.2 mg (1.2 mg Sublingual Given 10/05/18 2122)  enalaprilat (VASOTEC) injection 1.25 mg (1.25 mg Intravenous Given 10/05/18 2142)  iohexol (OMNIPAQUE) 350 MG/ML injection 100 mL (100 mLs Intravenous Contrast Given 10/05/18 2309)     Initial Impression / Assessment and Plan / ED Course  I have reviewed the triage vital signs and the nursing notes.  Pertinent labs & imaging results that were available during my care of the patient were reviewed by me and considered in my medical decision making (see chart for details).  Patient presents with persistent shortness of breath, with concern for asthma exacerbation, she also is noted to be hypertensive and tachycardic on arrival.  She reports she was seen for shortness of breath at her PCPs office yesterday with concern for worsening asthma and was referred to an asthma and allergy specialist and told to increase use of her albuterol inhaler, she reports worsening symptoms despite this.  On auscultation she does not have any wheezing, rales or rhonchi, slightly decreased air movement throughout.  No associated chest pain, lower extremity swelling, lightheadedness or dizziness.  No fevers or cough.  Will treat with prednisone and albuterol inhaler  but given  clear lungs I am concerned for other possible etiologies for shortness of breath.  Will check basic labs, BNP, d-dimer, troponin, and coronavirus swab.  Patient is significantly hypertensive, blood pressure increasing to 199/144, patient tachycardic and tachypneic.  Question if patient could have hypertensive urgency with some element of heart failure as cause for her shortness of breath.  Discussed with Dr. Adela Lank and will give 3 tablets of sublingual nitro as well as dose of enalapril to help reduce blood pressure.  EKG shows sinus tachycardia with some borderline abnormal repull within the anterior leads, no significant change when compared to previous tracing.  Patient's chest x-ray shows some mild vascular congestion and cardiomegaly but no evidence of fulminant pulmonary edema, or infiltrate.  No leukocytosis, normal hemoglobin and no acute electrolyte derangements.  Despite significant hypertension, normal kidney function.  Initial troponin is 14.  D-dimer is slightly elevated at 0.64.  Will proceed with CT angio to rule out PE.  Patient's blood pressure improving to the 150s/100s, although patient continues to be persistently tachycardic, had initially ordered IV fluids but these were stopped given concern for possible vascular congestion on chest x-ray and heart failure playing into symptoms.  Although patient's BNP is not elevated and she does not have any lower extremity edema.  Coronavirus swab negative.  CTA does not show evidence of pulmonary embolus, does show cardiomegaly, also noted elevation of the right hemidiaphragm with some compressive atelectasis.  Despite reassuring work-up blood pressure has plateaued with improvement at 150/100.  Patient remains persistently tachycardic and continues to report feeling short of breath.  With ambulation patient tachycardia to the 120s and respiratory rate increases to the 40s.  I still have concern for possible heart failure cardiomyopathy is  contributing to patient's symptoms, will also add on thyroid studies given persistent tachycardia.  Will consult for admission.  Case discussed with Dr.Niu with [facility] hospitalist who will see and admit the patient.  Requests dose of Lasix for the patient, 20 of IV Lasix given.  Final Clinical Impressions(s) / ED Diagnoses   Final diagnoses:  Hypertensive urgency  Shortness of breath  Cardiomegaly  Exacerbation of asthma, unspecified asthma severity, unspecified whether persistent    ED Discharge Orders    None       Legrand Rams 10/06/18 0112    Melene Plan, DO 10/06/18 1517

## 2018-10-06 ENCOUNTER — Observation Stay (HOSPITAL_BASED_OUTPATIENT_CLINIC_OR_DEPARTMENT_OTHER): Payer: BC Managed Care – PPO

## 2018-10-06 DIAGNOSIS — I340 Nonrheumatic mitral (valve) insufficiency: Secondary | ICD-10-CM | POA: Diagnosis not present

## 2018-10-06 DIAGNOSIS — I160 Hypertensive urgency: Secondary | ICD-10-CM | POA: Diagnosis not present

## 2018-10-06 DIAGNOSIS — R7989 Other specified abnormal findings of blood chemistry: Secondary | ICD-10-CM

## 2018-10-06 DIAGNOSIS — I10 Essential (primary) hypertension: Secondary | ICD-10-CM | POA: Diagnosis not present

## 2018-10-06 DIAGNOSIS — J45909 Unspecified asthma, uncomplicated: Secondary | ICD-10-CM | POA: Diagnosis present

## 2018-10-06 DIAGNOSIS — F418 Other specified anxiety disorders: Secondary | ICD-10-CM | POA: Diagnosis present

## 2018-10-06 DIAGNOSIS — R0602 Shortness of breath: Secondary | ICD-10-CM | POA: Insufficient documentation

## 2018-10-06 DIAGNOSIS — R609 Edema, unspecified: Secondary | ICD-10-CM

## 2018-10-06 DIAGNOSIS — J4520 Mild intermittent asthma, uncomplicated: Secondary | ICD-10-CM | POA: Diagnosis not present

## 2018-10-06 DIAGNOSIS — K219 Gastro-esophageal reflux disease without esophagitis: Secondary | ICD-10-CM | POA: Diagnosis present

## 2018-10-06 DIAGNOSIS — I517 Cardiomegaly: Secondary | ICD-10-CM | POA: Diagnosis present

## 2018-10-06 DIAGNOSIS — I5021 Acute systolic (congestive) heart failure: Secondary | ICD-10-CM

## 2018-10-06 HISTORY — DX: Acute systolic (congestive) heart failure: I50.21

## 2018-10-06 LAB — CBC
HCT: 42.5 % (ref 36.0–46.0)
Hemoglobin: 13.4 g/dL (ref 12.0–15.0)
MCH: 28.1 pg (ref 26.0–34.0)
MCHC: 31.5 g/dL (ref 30.0–36.0)
MCV: 89.1 fL (ref 80.0–100.0)
Platelets: 239 10*3/uL (ref 150–400)
RBC: 4.77 MIL/uL (ref 3.87–5.11)
RDW: 13.4 % (ref 11.5–15.5)
WBC: 9.5 10*3/uL (ref 4.0–10.5)
nRBC: 0 % (ref 0.0–0.2)

## 2018-10-06 LAB — TROPONIN I (HIGH SENSITIVITY): Troponin I (High Sensitivity): 10 ng/L (ref <18)

## 2018-10-06 LAB — BASIC METABOLIC PANEL
Anion gap: 11 (ref 5–15)
BUN: 13 mg/dL (ref 6–20)
CO2: 22 mmol/L (ref 22–32)
Calcium: 8.8 mg/dL — ABNORMAL LOW (ref 8.9–10.3)
Chloride: 104 mmol/L (ref 98–111)
Creatinine, Ser: 0.81 mg/dL (ref 0.44–1.00)
GFR calc Af Amer: >60 mL/min (ref >60)
GFR calc non Af Amer: >60 mL/min (ref >60)
Glucose, Bld: 186 mg/dL — ABNORMAL HIGH (ref 70–99)
Potassium: 4 mmol/L (ref 3.5–5.1)
Sodium: 137 mmol/L (ref 135–145)

## 2018-10-06 LAB — PREGNANCY, URINE: Preg Test, Ur: NEGATIVE

## 2018-10-06 LAB — ECHOCARDIOGRAM COMPLETE
Height: 62 in
Weight: 5262.82 oz

## 2018-10-06 LAB — HIV ANTIBODY (ROUTINE TESTING W REFLEX): HIV Screen 4th Generation wRfx: NONREACTIVE

## 2018-10-06 LAB — TSH: TSH: 0.578 u[IU]/mL (ref 0.350–4.500)

## 2018-10-06 MED ORDER — ONDANSETRON HCL 4 MG/2ML IJ SOLN: 4.0000 mg | Freq: Four times a day (QID) | INTRAMUSCULAR | Status: DC | PRN

## 2018-10-06 MED ORDER — HYDRALAZINE HCL 20 MG/ML IJ SOLN: 5.0000 mg | INTRAMUSCULAR | Status: DC | PRN

## 2018-10-06 MED ORDER — ONDANSETRON HCL 4 MG PO TABS: 4.0000 mg | ORAL_TABLET | Freq: Four times a day (QID) | ORAL | Status: DC | PRN

## 2018-10-06 MED ORDER — FUROSEMIDE 10 MG/ML IJ SOLN: 20.0000 mg | Freq: Three times a day (TID) | INTRAMUSCULAR | Status: DC

## 2018-10-06 MED ORDER — BUPROPION HCL ER (XL) 300 MG PO TB24
300.0000 mg | ORAL_TABLET | Freq: Every day | ORAL | Status: DC
  Administered 2018-10-06 – 2018-10-07 (×2): 300 mg via ORAL
  Filled 2018-10-06 (×2): qty 1

## 2018-10-06 MED ORDER — ACETAMINOPHEN 325 MG PO TABS: 650.0000 mg | ORAL_TABLET | Freq: Four times a day (QID) | ORAL | Status: DC | PRN

## 2018-10-06 MED ORDER — METOPROLOL TARTRATE 25 MG PO TABS
12.5000 mg | ORAL_TABLET | Freq: Two times a day (BID) | ORAL | Status: DC
  Administered 2018-10-06: 12.5 mg via ORAL
  Filled 2018-10-06: qty 1

## 2018-10-06 MED ORDER — FUROSEMIDE 10 MG/ML IJ SOLN
40.0000 mg | Freq: Once | INTRAMUSCULAR | Status: AC
  Administered 2018-10-06: 40 mg via INTRAVENOUS
  Filled 2018-10-06: qty 4

## 2018-10-06 MED ORDER — DULOXETINE HCL 60 MG PO CPEP
60.0000 mg | ORAL_CAPSULE | Freq: Every day | ORAL | Status: DC
  Administered 2018-10-06 – 2018-10-07 (×2): 60 mg via ORAL
  Filled 2018-10-06 (×2): qty 1

## 2018-10-06 MED ORDER — AMLODIPINE BESYLATE 5 MG PO TABS
5.0000 mg | ORAL_TABLET | Freq: Every day | ORAL | Status: DC
  Administered 2018-10-06 – 2018-10-07 (×2): 5 mg via ORAL
  Filled 2018-10-06 (×2): qty 1

## 2018-10-06 MED ORDER — LOSARTAN POTASSIUM 25 MG PO TABS
25.0000 mg | ORAL_TABLET | Freq: Every day | ORAL | Status: DC
  Administered 2018-10-06 – 2018-10-07 (×2): 25 mg via ORAL
  Filled 2018-10-06 (×2): qty 1

## 2018-10-06 MED ORDER — MONTELUKAST SODIUM 10 MG PO TABS
10.0000 mg | ORAL_TABLET | Freq: Every day | ORAL | Status: DC
  Administered 2018-10-06: 10 mg via ORAL
  Filled 2018-10-06: qty 1

## 2018-10-06 MED ORDER — CARVEDILOL 6.25 MG PO TABS
6.2500 mg | ORAL_TABLET | Freq: Two times a day (BID) | ORAL | Status: DC
  Administered 2018-10-06 – 2018-10-07 (×2): 6.25 mg via ORAL
  Filled 2018-10-06 (×2): qty 1

## 2018-10-06 MED ORDER — POTASSIUM CHLORIDE CRYS ER 20 MEQ PO TBCR
40.0000 meq | EXTENDED_RELEASE_TABLET | Freq: Once | ORAL | Status: AC
  Administered 2018-10-06: 40 meq via ORAL
  Filled 2018-10-06: qty 2

## 2018-10-06 MED ORDER — PANTOPRAZOLE SODIUM 40 MG PO TBEC
40.0000 mg | DELAYED_RELEASE_TABLET | Freq: Every day | ORAL | Status: DC
  Administered 2018-10-06 – 2018-10-07 (×2): 40 mg via ORAL
  Filled 2018-10-06 (×2): qty 1

## 2018-10-06 MED ORDER — ENOXAPARIN SODIUM 40 MG/0.4ML ~~LOC~~ SOLN
40.0000 mg | Freq: Every day | SUBCUTANEOUS | Status: DC
  Administered 2018-10-06: 40 mg via SUBCUTANEOUS
  Filled 2018-10-06 (×2): qty 0.4

## 2018-10-06 MED ORDER — ALPRAZOLAM 1 MG PO TABS: 1.0000 mg | ORAL_TABLET | Freq: Three times a day (TID) | ORAL | 0 refills | Status: DC | PRN

## 2018-10-06 MED ORDER — DM-GUAIFENESIN ER 30-600 MG PO TB12: 1.0000 | ORAL_TABLET | Freq: Two times a day (BID) | ORAL | Status: DC | PRN

## 2018-10-06 MED ORDER — IPRATROPIUM-ALBUTEROL 0.5-2.5 (3) MG/3ML IN SOLN
3.0000 mL | Freq: Three times a day (TID) | RESPIRATORY_TRACT | Status: DC
  Administered 2018-10-06 – 2018-10-07 (×4): 3 mL via RESPIRATORY_TRACT
  Filled 2018-10-06 (×4): qty 3

## 2018-10-06 MED ORDER — FLUTICASONE PROPIONATE 50 MCG/ACT NA SUSP
1.0000 | Freq: Every morning | NASAL | Status: DC
  Filled 2018-10-06: qty 16

## 2018-10-06 MED ORDER — ALBUTEROL SULFATE (2.5 MG/3ML) 0.083% IN NEBU: 3.0000 mL | INHALATION_SOLUTION | Freq: Four times a day (QID) | RESPIRATORY_TRACT | Status: DC | PRN

## 2018-10-06 MED ORDER — ALPRAZOLAM 1 MG PO TABS
1.0000 mg | ORAL_TABLET | Freq: Three times a day (TID) | ORAL | Status: DC | PRN
  Administered 2018-10-06 – 2018-10-07 (×2): 1 mg via ORAL
  Filled 2018-10-06 (×2): qty 1

## 2018-10-06 MED ORDER — ACETAMINOPHEN 650 MG RE SUPP: 650.0000 mg | Freq: Four times a day (QID) | RECTAL | Status: DC | PRN

## 2018-10-06 MED ORDER — PERFLUTREN LIPID MICROSPHERE
1.0000 mL | INTRAVENOUS | Status: AC | PRN
  Administered 2018-10-06: 2 mL via INTRAVENOUS
  Filled 2018-10-06: qty 10

## 2018-10-06 MED ORDER — LEVALBUTEROL HCL 0.63 MG/3ML IN NEBU: 0.6300 mg | INHALATION_SOLUTION | Freq: Four times a day (QID) | RESPIRATORY_TRACT | Status: DC | PRN

## 2018-10-06 MED ORDER — IPRATROPIUM BROMIDE 0.02 % IN SOLN
3.0000 mL | RESPIRATORY_TRACT | Status: DC
  Administered 2018-10-06: 0.6 mg via RESPIRATORY_TRACT
  Filled 2018-10-06: qty 5

## 2018-10-06 MED ORDER — AMLODIPINE BESYLATE 5 MG PO TABS: 5.0000 mg | ORAL_TABLET | Freq: Every day | ORAL | Status: DC

## 2018-10-06 MED ORDER — BUDESONIDE 0.25 MG/2ML IN SUSP
0.2500 mg | Freq: Two times a day (BID) | RESPIRATORY_TRACT | Status: DC
  Administered 2018-10-06 – 2018-10-07 (×3): 0.25 mg via RESPIRATORY_TRACT
  Filled 2018-10-06 (×3): qty 2

## 2018-10-06 NOTE — Plan of Care (Signed)
Nutrition Education Note  RD consulted for nutrition education regarding a Heart Healthy diet.   **[place] and reviewed low sodium diet with patient. RD to e-mail handouts to patient.  RD to provide "Low Sodium Nutrition Therapy" and "Heart Healthy Label Reading Tips" handouts from the Academy of [employer]etics. Reviewed patient's dietary recall. Provided examples on ways to decrease sodium and fat intake in diet. Discouraged intake of processed foods and use of salt shaker. Encouraged fresh fruits and vegetables as well as whole grain sources of carbohydrates to maximize fiber intake. Teach back method used.  Expect fair compliance. Pt very receptive to education and handouts.  Body mass index is 60.16 kg/m. Pt meets criteria for morbid obesity based on current BMI.  Current diet order is Heart healthy. Pt reports she is eating well. Labs and medications reviewed. No further nutrition interventions warranted at this time. RD contact information provided. If additional nutrition issues arise, please re-consult RD.  Clayton Bibles, MS, RD, [place] Dietitian Pager: [phone] After Hours Pager: [phone]

## 2018-10-06 NOTE — Progress Notes (Signed)
10/06/2018 5:11 PM   Pt updated with echo results and plan of care.  Pt verbalized understanding.   Murvin Natal MD

## 2018-10-06 NOTE — Telephone Encounter (Signed)
pmp reviewd, appropriate meds refilled 

## 2018-10-06 NOTE — Progress Notes (Signed)
Bilateral lower extremity venous duplex has been completed. Preliminary results can be found in CV Proc through chart review.   10/06/18 9:06 AM Samantha Romero RVT

## 2018-10-06 NOTE — H&P (Signed)
History and Physical    Samantha Romero DOB: 10/05/1975 DOA: 10/05/2018  Referring MD/NP/PA:   PCP: Myles LippsSantiago, Irma M, MD   Patient coming from:  The patient is coming from home.  At baseline, pt is independent for most of ADL.        Chief Complaint: Shortness of breath  HPI: Samantha GobbleDeidre C Dosanjh is a 43 y.o. female with medical history significant of hypertension, asthma, GERD, depression, anxiety, obesity, gastric lap band, fibromyalgia, medication noncompliance, who presents with shortness of breath.  Patient states that her shortness breath started yesterday, which has been progressively worsening.  Patient denies cough, wheezing, fever or chills.  No chest pain.  PCP give pt inhaler without significant help.  Patient denies nausea, vomiting, diarrhea, abdominal pain, symptoms of UTI or unilateral weakness.  Patient is not taking blood pressure medications.  Her blood pressure is significantly elevated at 199/144.  ED Course: pt was found to have WBC 9.3, BNP 86.2, troponin 86, negative COVID-19 test, d-dimer 0.64, temperature normal, tachycardia, tachypnea, oxygen saturation 96% on room air.  Chest x-ray showed vascular congestion.  CT angiogram is negative for PE.  Patient is placed on telemetry bed for observation.  Review of Systems:   General: no fevers, chills, no body weight gain, has fatigue HEENT: no blurry vision, hearing changes or sore throat Respiratory: has dyspnea, no coughing, wheezing CV: no chest pain, no palpitations GI: no nausea, vomiting, abdominal pain, diarrhea, constipation GU: no dysuria, burning on urination, increased urinary frequency, hematuria  Ext: no leg edema Neuro: no unilateral weakness, numbness, or tingling, no vision change or hearing loss Skin: no rash, no skin tear. MSK: No muscle spasm, no deformity, no limitation of range of movement in spin Heme: No easy bruising.  Travel history: No recent long distant travel.  Allergy:    Allergies  Allergen Reactions   Grass Extracts [Gramineae Pollens]     Nasal congestion, post nasal drip    Shellfish Allergy Itching    In throat only.    Past Medical History:  Diagnosis Date   Anxiety    panic attacks   Asthma    Depression    Fibromyalgia    Hypertension    no meds currently   No pertinent past medical history    Pneumonia    hx of    Sinus problem     Past Surgical History:  Procedure Laterality Date   ESOPHAGOGASTRODUODENOSCOPY N/A 11/26/2014   Procedure: ESOPHAGOGASTRODUODENOSCOPY (EGD);  Surgeon: Ovidio Kinavid Newman, MD;  [facility];  Service: General;  Laterality: N/A;   GASTRIC BANDING PORT REVISION  08/24/11   LAPAROSCOPIC GASTRIC BANDING  12/26/2007   LAPAROSCOPIC LYSIS OF ADHESIONS N/A 01/06/2015   Procedure: LAPAROSCOPIC LYSIS OF ADHESIONS;  Surgeon: Luretha MurphyMatthew Martin, MD;  Location: [facility];  Service: General;  Laterality: N/A;   LAPAROSCOPIC REPAIR AND REMOVAL OF GASTRIC BAND N/A 01/06/2015   Procedure: LAPAROSCOPIC RESIGHTING OF GASTRIC Band PORT;  Surgeon: Luretha MurphyMatthew Martin, MD;  Location: [facility];  Service: General;  Laterality: N/A;   NO PAST SURGERIES      Social History:  reports that she has never smoked. She has never used smokeless tobacco. She reports current alcohol use. She reports that she does not use drugs.  Family History:  Family History  Problem Relation Age of Onset   Cancer Maternal Uncle        lung   Cancer Maternal Grandfather        lung and  back     Prior to Admission medications   Medication Sig Start Date End Date Taking? Authorizing Provider  albuterol (PROAIR HFA) 108 (90 Base) MCG/ACT inhaler Inhale 2 puffs into the lungs every 6 (six) hours as needed for wheezing or shortness of breath.    Yes [provider]  albuterol (PROVENTIL) (2.5 MG/3ML) 0.083% nebulizer solution Take 3 mLs (2.5 mg total) by nebulization every 6 (six) hours as needed for wheezing or shortness of breath.  08/23/18  Yes Myles LippsSantiago, Irma M, MD  ALPRAZolam Prudy Feeler(XANAX) 1 MG tablet Take 1 tablet (1 mg total) by mouth 3 (three) times daily as needed for anxiety. 08/14/18  Yes Myles LippsSantiago, Irma M, MD  amLODipine (NORVASC) 5 MG tablet Take 1 tablet (5 mg total) by mouth daily. 08/14/18  Yes Myles LippsSantiago, Irma M, MD  cetirizine (ZYRTEC) 10 MG tablet Take 10 mg by mouth daily with breakfast.   Yes [provider]  DULoxetine (CYMBALTA) 60 MG capsule TAKE 1 CAPSULE (60 MG TOTAL) BY MOUTH DAILY. NEEDS OFFICE VISIT PRIOR TO NEXT REFILL 09/28/18  Yes Myles LippsSantiago, Irma M, MD  fluticasone [facility](FLONASE) 50 MCG/ACT nasal spray Place 1 spray into both nostrils every morning.   Yes [provider]  fluticasone (FLOVENT HFA) 110 MCG/ACT inhaler Inhale 2 puffs into the lungs 2 (two) times daily. 10/04/18  Yes Corum, Minerva FesterLisa L, MD  montelukast (SINGULAIR) 10 MG tablet Take 1 tablet (10 mg total) by mouth at bedtime. 08/14/18  Yes Myles LippsSantiago, Irma M, MD  phentermine (ADIPEX-P) 37.5 MG tablet Take 37.5 mg by mouth daily.   Yes [provider]  buPROPion (WELLBUTRIN XL) 300 MG 24 hr tablet Take 1 tablet (300 mg total) by mouth daily. Needs office visit prior to next refill 10/05/18   Myles LippsSantiago, Irma M, MD  omeprazole (PRILOSEC) 40 MG capsule Take 1 capsule (40 mg total) by mouth daily. 10/04/18   Wandra Feinsteinorum, Lisa L, MD    Physical Exam: Vitals:   10/06/18 0157 10/06/18 0239 10/06/18 0250 10/06/18 0606  BP: (!) 165/100  136/83 (!) 150/100  Pulse: (!) 122  (!) 105 (!) 109  Resp: (!) 27  18 16   Temp:   97.8 F (36.6 C) 97.7 F (36.5 C)  TempSrc:   Oral   SpO2: 95%  99% 96%  Weight:  (!) 149.2 kg    Height:  5\' 2"  (1.575 m)     General: Not in acute distress HEENT:       Eyes: PERRL, EOMI, no scleral icterus.       ENT: No discharge from the ears and nose, no pharynx injection, no tonsillar enlargement.        Neck: No JVD, no bruit, no mass felt. Heme: No neck lymph node enlargement. Cardiac: S1/S2, RRR, No murmurs, No gallops  or rubs. Respiratory: Slightly decreased air movement bilaterally, no wheezing.   GI: Soft, nondistended, nontender, no rebound pain, no organomegaly, BS present. GU: No hematuria Ext: No pitting leg edema bilaterally. 2+DP/PT pulse bilaterally. Musculoskeletal: No joint deformities, No joint redness or warmth, no limitation of ROM in spin. Skin: No rashes.  Neuro: Alert, oriented X3, cranial nerves II-XII grossly intact, moves all extremities normally.  Psych: Patient is not psychotic, no suicidal or hemocidal ideation.  Labs on Admission: I have personally reviewed following labs and imaging studies  CBC: Recent Labs  Lab 10/05/18 2029 10/06/18 0403  WBC 9.3 9.5  NEUTROABS 6.0  --   HGB 12.7 13.4  HCT 41.0 42.5  MCV 89.5 89.1  PLT 211 239   Basic Metabolic Panel: Recent Labs  Lab 10/05/18 2029 10/06/18 0403  NA 138 137  K 3.9 4.0  CL 105 104  CO2 23 22  GLUCOSE 110* 186*  BUN 15 13  CREATININE 0.80 0.81  CALCIUM 8.6* 8.8*   GFR: Estimated Creatinine Clearance: 129.4 mL/min (by C-G formula based on SCr of 0.81 mg/dL). Liver Function Tests: No results for input(s): AST, ALT, ALKPHOS, BILITOT, PROT, ALBUMIN in the last 168 hours. No results for input(s): LIPASE, AMYLASE in the last 168 hours. No results for input(s): AMMONIA in the last 168 hours. Coagulation Profile: No results for input(s): INR, PROTIME in the last 168 hours. Cardiac Enzymes: No results for input(s): CKTOTAL, CKMB, CKMBINDEX, TROPONINI in the last 168 hours. BNP (last 3 results) No results for input(s): PROBNP in the last 8760 hours. HbA1C: No results for input(s): HGBA1C in the last 72 hours. CBG: No results for input(s): GLUCAP in the last 168 hours. Lipid Profile: No results for input(s): CHOL, HDL, LDLCALC, TRIG, CHOLHDL, LDLDIRECT in the last 72 hours. Thyroid Function Tests: Recent Labs    10/05/18 2353  TSH 0.578   Anemia Panel: No results for input(s): VITAMINB12, FOLATE,  FERRITIN, TIBC, IRON, RETICCTPCT in the last 72 hours. Urine analysis: No results found for: COLORURINE, APPEARANCEUR, LABSPEC, PHURINE, GLUCOSEU, HGBUR, BILIRUBINUR, KETONESUR, PROTEINUR, UROBILINOGEN, NITRITE, LEUKOCYTESUR Sepsis Labs: @LABRCNTIP (procalcitonin:4,lacticidven:4) ) Recent Results (from the past 240 hour(s))  SARS Coronavirus 2 (CEPHEID - Performed in [facility] hospital lab), Hosp Order     Status: None   Collection Time: 10/05/18  9:06 PM   Specimen: Nasopharyngeal Swab  Result Value Ref Range Status   SARS Coronavirus 2 NEGATIVE NEGATIVE Final    Comment: (NOTE) If result is NEGATIVE SARS-CoV-2 target nucleic acids are NOT DETECTED. The SARS-CoV-2 RNA is generally detectable in upper and lower  respiratory specimens during the acute phase of infection. The lowest  concentration of SARS-CoV-2 viral copies this assay can detect is 250  copies / mL. A negative result does not preclude SARS-CoV-2 infection  and should not be used as the sole basis for treatment or other  patient management decisions.  A negative result may occur with  improper specimen collection / handling, submission of specimen other  than nasopharyngeal swab, presence of viral mutation(s) within the  areas targeted by this assay, and inadequate number of viral copies  (<250 copies / mL). A negative result must be combined with clinical  observations, patient history, and epidemiological information. If result is POSITIVE SARS-CoV-2 target nucleic acids are DETECTED. The SARS-CoV-2 RNA is generally detectable in upper and lower  respiratory specimens dur ing the acute phase of infection.  Positive  results are indicative of active infection with SARS-CoV-2.  Clinical  correlation with patient history and other diagnostic information is  necessary to determine patient infection status.  Positive results do  not rule out bacterial infection or co-infection with other viruses. If result is  PRESUMPTIVE POSTIVE SARS-CoV-2 nucleic acids MAY BE PRESENT.   A presumptive positive result was obtained on the submitted specimen  and confirmed on repeat testing.  While 2019 novel coronavirus  (SARS-CoV-2) nucleic acids may be present in the submitted sample  additional confirmatory testing may be necessary for epidemiological  and / or clinical management purposes  to differentiate between  SARS-CoV-2 and other Sarbecovirus currently known to infect humans.  If clinically indicated additional testing with an alternate test  methodology [phone])  is advised. The SARS-CoV-2 RNA is generally  detectable in upper and lower respiratory sp ecimens during the acute  phase of infection. The expected result is Negative. Fact Sheet for Patients:  BoilerBrush.com.cyhttps://www.fda.gov/media/136312/download Fact Sheet for Healthcare Providers: https://pope.com/https://www.fda.gov/media/136313/download This test is not yet approved or cleared by the [place] FDA and has been authorized for detection and/or diagnosis of SARS-CoV-2 by FDA under an Emergency Use Authorization (EUA).  This EUA will remain in effect (meaning this test can be used) for the duration of the COVID-19 declaration under Section 564(b)(1) of the Act, 21 U.S.C. section 360bbb-3(b)(1), unless the authorization is terminated or revoked sooner. Performed at [facility], [address] [place], [place] [address]      Radiological Exams on Admission: Ct Angio Chest Pe W And/or Wo Contrast  Result Date: 10/05/2018 CLINICAL DATA:  PE suspected EXAM: CT ANGIOGRAPHY CHEST WITH CONTRAST TECHNIQUE: Multidetector CT imaging of the chest was performed using the standard protocol during bolus administration of intravenous contrast. Multiplanar CT image reconstructions and MIPs were obtained to evaluate the vascular anatomy. CONTRAST:  100mL OMNIPAQUE IOHEXOL 350 MG/ML SOLN COMPARISON:  None. FINDINGS: Cardiovascular: Contrast injection is  sufficient to demonstrate satisfactory opacification of the pulmonary arteries to the segmental level. There is no pulmonary embolus. The main pulmonary artery is within normal limits for size. There is no CT evidence of acute right heart strain. The visualized aorta is normal. Heart size is enlarged. Mediastinum/Nodes: --No mediastinal or hilar lymphadenopathy. --No axillary lymphadenopathy. --No supraclavicular lymphadenopathy. --Normal thyroid gland. --The esophagus is unremarkable Lungs/Pleura: There is elevation of the right hemidiaphragm with compressive atelectasis involving the right lung base. There is no pneumothorax. No large pleural effusion. The trachea is unremarkable. Upper Abdomen: A lap band is in place. There is no acute abnormality detected in the partially visualized upper abdomen. Musculoskeletal: No chest wall abnormality. No acute or significant osseous findings. Review of the MIP images confirms the above findings. IMPRESSION: 1. No PE. 2. Cardiomegaly. 3. Elevation of the right hemidiaphragm with compressive atelectasis at the right lung base. Electronically Signed   By: Katherine Mantlehristopher  Green M.D.   On: 10/05/2018 23:41   Dg Chest Port 1 View  Result Date: 10/05/2018 CLINICAL DATA:  Increasing wheezing and shortness of breath EXAM: PORTABLE CHEST 1 VIEW COMPARISON:  12/16/2014 FINDINGS: Cardiac shadow is enlarged but accentuated by the portable technique. Mild vascular congestion is noted without interstitial edema. No definitive infiltrate is seen. No bony abnormality is noted. IMPRESSION: Mild vascular congestion without interstitial edema. Electronically Signed   By: Alcide CleverMark  Lukens M.D.   On: 10/05/2018 19:18     EKG: Independently reviewed.  Sinus rhythm, QTC 483, LAE, early R wave progression  Assessment/Plan Principal Problem:   Hypertensive urgency Active Problems:   Essential hypertension   Asthma   GERD (gastroesophageal reflux disease)   Depression with  anxiety   Hypertensive urgency and hx of HTN: bp 199/144 on admission. This is due to medication noncompliance. Blood pressure improved to 154/106 after giving 1 dose of Enalaprilat.   Patient has shortness of breath, which is likely due to hypertensive urgency and possible flash of pulmonary edema given chest x-ray finding of vascular congestion. CTA is negative for PE.  -Placed on telemetry bed for observation -Start home amlodipine -IV hydralazine as needed -Give 1 dose of Lasix 40 mg -Get 2D echo -LE doppler to positive D-dimer  Asthma: Patient does not have cough or wheezing.  Has a slightly decreased air  movement bilaterally.  Does not seem to have asthma exacerbation.  Her shortness of breath is likely due to hypertensive urgency and flash pulmonary edema.  Patient received 1 dose of Solu Cortef 125 mg in ED. - Atrovent inhaler, Singulair, PRN Xopenex.    GERD (gastroesophageal reflux disease): -Protonix  Depression and anxiety: Stable, no suicidal or homicidal ideations. -Continue home medications: Xanax, Wellbutrin   DVT ppx: SQ Lovenox Code Status: Full code Family Communication: None at bed side.    Disposition Plan:  Anticipate discharge back to previous home environment Consults called:   none Admission status: Obs / tele       Date of Service 10/06/2018    [place] [facility]   If 7PM-7AM, please contact night-coverage [email or web address] Password [facility] 10/06/2018, 6:44 AM

## 2018-10-06 NOTE — Progress Notes (Signed)
  Echocardiogram 2D Echocardiogram has been performed.  Samantha Romero 10/06/2018, 11:42 AM

## 2018-10-06 NOTE — Progress Notes (Signed)
ASSUMPTION OF CARE NOTE   10/06/2018 8:15 AM  Samantha Romero was seen and examined.  The H&P by the admitting provider, orders, imaging was reviewed.  Please see new orders.  Will continue to follow.   Vitals:   10/06/18 0606 10/06/18 0753  BP: (!) 150/100   Pulse: (!) 109   Resp: 16   Temp: 97.7 F (36.5 C)   SpO2: 96% 95%    Results for orders placed or performed during the hospital encounter of 10/05/18  SARS Coronavirus 2 (CEPHEID - Performed in [place] hospital lab), [facility] Order   Specimen: Nasopharyngeal Swab  Result Value Ref Range   SARS Coronavirus 2 NEGATIVE NEGATIVE  Basic metabolic panel  Result Value Ref Range   Sodium 138 135 - 145 mmol/L   Potassium 3.9 3.5 - 5.1 mmol/L   Chloride 105 98 - 111 mmol/L   CO2 23 22 - 32 mmol/L   Glucose, Bld 110 (H) 70 - 99 mg/dL   BUN 15 6 - 20 mg/dL   Creatinine, Ser 0.80 0.44 - 1.00 mg/dL   Calcium 8.6 (L) 8.9 - 10.3 mg/dL   GFR calc non Af Amer >60 >60 mL/min   GFR calc Af Amer >60 >60 mL/min   Anion gap 10 5 - 15  CBC with Differential  Result Value Ref Range   WBC 9.3 4.0 - 10.5 K/uL   RBC 4.58 3.87 - 5.11 MIL/uL   Hemoglobin 12.7 12.0 - 15.0 g/dL   HCT 41.0 36.0 - 46.0 %   MCV 89.5 80.0 - 100.0 fL   MCH 27.7 26.0 - 34.0 pg   MCHC 31.0 30.0 - 36.0 g/dL   RDW 13.5 11.5 - 15.5 %   Platelets 211 150 - 400 K/uL   nRBC 0.0 0.0 - 0.2 %   Neutrophils Relative % 65 %   Neutro Abs 6.0 1.7 - 7.7 K/uL   Lymphocytes Relative 26 %   Lymphs Abs 2.4 0.7 - 4.0 K/uL   Monocytes Relative 7 %   Monocytes Absolute 0.6 0.1 - 1.0 K/uL   Eosinophils Relative 2 %   Eosinophils Absolute 0.2 0.0 - 0.5 K/uL   Basophils Relative 0 %   Basophils Absolute 0.0 0.0 - 0.1 K/uL   Immature Granulocytes 0 %   Abs Immature Granulocytes 0.04 0.00 - 0.07 K/uL  D-dimer, quantitative  Result Value Ref Range   D-Dimer, Quant 0.64 (H) 0.00 - 0.50 ug/mL-FEU  Brain natriuretic peptide  Result Value Ref Range   B Natriuretic Peptide 86.2 0.0 -  100.0 pg/mL  TSH  Result Value Ref Range   TSH 0.578 0.350 - 4.500 uIU/mL  Pregnancy, urine  Result Value Ref Range   Preg Test, Ur NEGATIVE NEGATIVE  Basic metabolic panel  Result Value Ref Range   Sodium 137 135 - 145 mmol/L   Potassium 4.0 3.5 - 5.1 mmol/L   Chloride 104 98 - 111 mmol/L   CO2 22 22 - 32 mmol/L   Glucose, Bld 186 (H) 70 - 99 mg/dL   BUN 13 6 - 20 mg/dL   Creatinine, Ser 0.81 0.44 - 1.00 mg/dL   Calcium 8.8 (L) 8.9 - 10.3 mg/dL   GFR calc non Af Amer >60 >60 mL/min   GFR calc Af Amer >60 >60 mL/min   Anion gap 11 5 - 15  CBC  Result Value Ref Range   WBC 9.5 4.0 - 10.5 K/uL   RBC 4.77 3.87 - 5.11 MIL/uL  Hemoglobin 13.4 12.0 - 15.0 g/dL   HCT 93.8 10.1 - 75.1 %   MCV 89.1 80.0 - 100.0 fL   MCH 28.1 26.0 - 34.0 pg   MCHC 31.5 30.0 - 36.0 g/dL   RDW 02.5 85.2 - 77.8 %   Platelets 239 150 - 400 K/uL   nRBC 0.0 0.0 - 0.2 %  Troponin I (High Sensitivity)  Result Value Ref Range   Troponin I (High Sensitivity) 14.0 <18 ng/L  Troponin I (High Sensitivity)  Result Value Ref Range   Troponin I (High Sensitivity) 10.0 <18 ng/L   C. Laural Benes, MD [facility]   10/05/2018  5:55 PM How to contact the [facility] Attending or Consulting provider 7A - 7P or covering provider during after hours 7P -7A, for this patient?  1. Check the care team in [facility] and look for a) attending/consulting [facility] provider listed and b) the [facility] team listed 2. Log into [email or web address] and use [facility] universal password to access. If you do not have the password, please contact the hospital operator. 3. Locate the [facility] provider you are looking for under [facility] and page to a number that you can be directly reached. 4. If you still have difficulty reaching the provider, please page the [facility] (Director on Call) for the Hospitalists listed on amion for assistance.

## 2018-10-06 NOTE — Progress Notes (Signed)
Patient declined to have family updated 

## 2018-10-07 ENCOUNTER — Encounter (HOSPITAL_COMMUNITY): Payer: Self-pay | Admitting: Family Medicine

## 2018-10-07 DIAGNOSIS — I160 Hypertensive urgency: Secondary | ICD-10-CM | POA: Diagnosis not present

## 2018-10-07 DIAGNOSIS — I517 Cardiomegaly: Secondary | ICD-10-CM | POA: Diagnosis not present

## 2018-10-07 DIAGNOSIS — I10 Essential (primary) hypertension: Secondary | ICD-10-CM | POA: Diagnosis not present

## 2018-10-07 DIAGNOSIS — I5021 Acute systolic (congestive) heart failure: Secondary | ICD-10-CM | POA: Diagnosis not present

## 2018-10-07 DIAGNOSIS — K219 Gastro-esophageal reflux disease without esophagitis: Secondary | ICD-10-CM

## 2018-10-07 LAB — T3: T3, Total: 121 ng/dL (ref 71–180)

## 2018-10-07 LAB — T4: T4, Total: 8 ug/dL (ref 4.5–12.0)

## 2018-10-07 MED ORDER — POTASSIUM CHLORIDE CRYS ER 20 MEQ PO TBCR: 20.0000 meq | EXTENDED_RELEASE_TABLET | Freq: Every day | ORAL | 1 refills | Status: DC

## 2018-10-07 MED ORDER — CARVEDILOL 6.25 MG PO TABS: 6.2500 mg | ORAL_TABLET | Freq: Two times a day (BID) | ORAL | 1 refills | Status: DC

## 2018-10-07 MED ORDER — FUROSEMIDE 40 MG PO TABS
40.0000 mg | ORAL_TABLET | Freq: Every day | ORAL | Status: DC
  Administered 2018-10-07: 40 mg via ORAL
  Filled 2018-10-07: qty 1

## 2018-10-07 MED ORDER — LOSARTAN POTASSIUM 25 MG PO TABS: 25.0000 mg | ORAL_TABLET | Freq: Every day | ORAL | 1 refills | Status: DC

## 2018-10-07 MED ORDER — FUROSEMIDE 40 MG PO TABS: 40.0000 mg | ORAL_TABLET | Freq: Every day | ORAL | 1 refills | Status: DC

## 2018-10-07 MED ORDER — POTASSIUM CHLORIDE CRYS ER 20 MEQ PO TBCR
20.0000 meq | EXTENDED_RELEASE_TABLET | Freq: Every day | ORAL | Status: DC
  Administered 2018-10-07: 20 meq via ORAL
  Filled 2018-10-07: qty 1

## 2018-10-07 NOTE — Discharge Summary (Signed)
Physician Discharge Summary  Samantha Romero ZOX:[id number] DOB: 11-20-1975 DOA: 10/05/2018  PCP: Myles Lipps, MD  Admit date: 10/05/2018 Discharge date: 10/07/2018  Admitted From:  Home  Disposition: Home   Recommendations for Outpatient Follow-up:  1. Follow up with PCP in 1 weeks for BP recheck and labs 2. Please obtain BMP/CBC in 1 week 3. Establish care with [facility] cardiology in 2 weeks for CHF  Discharge Condition: STABLE   CODE STATUS: FULL    Brief Hospitalization Summary: Please see all hospital notes, images, labs for full details of the hospitalization. Dr. Evorn Gong HPI: Samantha Romero is a 43 y.o. female with medical history significant of hypertension, asthma, GERD, depression, anxiety, obesity, gastric lap band, fibromyalgia, medication noncompliance, who presents with shortness of breath.  Patient states that her shortness breath started yesterday, which has been progressively worsening.  Patient denies cough, wheezing, fever or chills.  No chest pain.  PCP give pt inhaler without significant help.  Patient denies nausea, vomiting, diarrhea, abdominal pain, symptoms of UTI or unilateral weakness.  Patient is not taking blood pressure medications.  Her blood pressure is significantly elevated at 199/144.  ED Course: pt was found to have WBC 9.3, BNP 86.2, troponin 86, negative COVID-19 test, d-dimer 0.64, temperature normal, tachycardia, tachypnea, oxygen saturation 96% on room air.  Chest x-ray showed vascular congestion.  CT angiogram is negative for PE.  Patient is placed on telemetry bed for observation.  The patient was admitted with shortness of breath and hypertensive urgency.  She was noted to be volume overloaded.  The patient was treated for acute systolic CHF exacerbation.  Her 2D echocardiogram revealed an EF of 35 to 40%.  She was tachycardic.  She was started on losartan 25 mg daily, Coreg 6.25 mg twice daily we continued her amlodipine 5 mg daily.  She was  started on Lasix IV and has diuresed well.  She is being discharged on oral Lasix 40 mg daily with potassium supplement.  She is feeling much better her blood pressures are better controlled now and her labs are stable.  She will discharge home with outpatient follow-up with her primary care provider and establish care with [facility] for cardiology services.  I spoke with the patient and I spoke with her mother and they have assured me that they will call and make appointment for her to follow-up as recommended.  2D Echocardiogram 10/06/18 1. The left ventricle has moderately reduced systolic function, with an ejection fraction of 35-40%. The cavity size was moderately dilated. There is moderately increased left ventricular wall thickness. Indeterminate diastolic filling due to E-A  fusion.  2. Hypokinesis of the inferior, inferoseptal walls of the LV.  3. Trivial pericardial effusion is present.  4. The mitral valve is abnormal. Mild thickening of the mitral valve leaflet. There is mild mitral annular calcification present. Mitral valve regurgitation is moderate by color flow Doppler. The MR jet is posteriorly-directed.  5. The aortic valve is tricuspid.  6. The interatrial septum was not assessed.   Discharge Diagnoses:  Principal Problem:   Hypertensive urgency Active Problems:   Essential hypertension   Asthma   GERD (gastroesophageal reflux disease)   Depression with anxiety   Cardiomegaly   CHF (congestive heart failure), NYHA class II, acute, systolic (HCC)   Discharge Instructions: Discharge Instructions    (HEART FAILURE PATIENTS) Call MD:  Anytime you have any of the following symptoms: 1) 3 pound weight gain in 24 hours or  5 pounds in 1 week 2) shortness of breath, with or without a dry hacking cough 3) swelling in the hands, feet or stomach 4) if you have to sleep on extra pillows at night in order to breathe.   Complete by: As directed    Call MD for:  difficulty  breathing, headache or visual disturbances   Complete by: As directed    Call MD for:  extreme fatigue   Complete by: As directed    Call MD for:  persistant dizziness or light-headedness   Complete by: As directed    Call MD for:  persistant nausea and vomiting   Complete by: As directed    Diet - low sodium heart healthy   Complete by: As directed    Increase activity slowly   Complete by: As directed      Allergies as of 10/07/2018      Reactions   Grass Extracts [gramineae Pollens]    Nasal congestion, post nasal drip    Shellfish Allergy Itching   In throat only.      Medication List    STOP taking these medications   phentermine 37.5 MG tablet Commonly known as: ADIPEX-P     TAKE these medications   ALPRAZolam 1 MG tablet Commonly known as: XANAX Take 1 tablet (1 mg total) by mouth 3 (three) times daily as needed for anxiety.   amLODipine 5 MG tablet Commonly known as: NORVASC Take 1 tablet (5 mg total) by mouth daily.   buPROPion 300 MG 24 hr tablet Commonly known as: WELLBUTRIN XL Take 1 tablet (300 mg total) by mouth daily. Needs office visit prior to next refill   carvedilol 6.25 MG tablet Commonly known as: COREG Take 1 tablet (6.25 mg total) by mouth 2 (two) times daily with a meal.   cetirizine 10 MG tablet Commonly known as: ZYRTEC Take 10 mg by mouth daily with breakfast.   DULoxetine 60 MG capsule Commonly known as: CYMBALTA TAKE 1 CAPSULE (60 MG TOTAL) BY MOUTH DAILY. NEEDS OFFICE VISIT PRIOR TO NEXT REFILL   Flovent HFA 110 MCG/ACT inhaler Generic drug: fluticasone Inhale 2 puffs into the lungs 2 (two) times daily.   fluticasone 50 MCG/ACT nasal spray Commonly known as: FLONASE Place 1 spray into both nostrils every morning.   furosemide 40 MG tablet Commonly known as: LASIX Take 1 tablet (40 mg total) by mouth daily.   losartan 25 MG tablet Commonly known as: COZAAR Take 1 tablet (25 mg total) by mouth daily.   montelukast 10 MG  tablet Commonly known as: SINGULAIR Take 1 tablet (10 mg total) by mouth at bedtime.   omeprazole 40 MG capsule Commonly known as: PRILOSEC Take 1 capsule (40 mg total) by mouth daily.   potassium chloride SA 20 MEQ tablet Commonly known as: K-DUR Take 1 tablet (20 mEq total) by mouth daily.   ProAir HFA 108 (90 Base) MCG/ACT inhaler Generic drug: albuterol Inhale 2 puffs into the lungs every 6 (six) hours as needed for wheezing or shortness of breath.   albuterol (2.5 MG/3ML) 0.083% nebulizer solution Commonly known as: PROVENTIL Take 3 mLs (2.5 mg total) by nebulization every 6 (six) hours as needed for wheezing or shortness of breath.      Follow-up Information    [facility]. Schedule an appointment as soon as possible for a visit in 2 week(s).   Why: Please call establish care with cardiologist for new diagnosis congestive heart failure.  Contact information: [address] [place] [address] [phone]       Myles Lipps, MD. Schedule an appointment as soon as possible for a visit in 1 week(s).   Specialty: Family Medicine Why: Follow Up Hospital and Blood pressure check and labs Contact information: [address] [place] [address] [phone]          Allergies  Allergen Reactions  . Grass Extracts [Gramineae Pollens]     Nasal congestion, post nasal drip   . Shellfish Allergy Itching    In throat only.   Allergies as of 10/07/2018      Reactions   Grass Extracts [gramineae Pollens]    Nasal congestion, post nasal drip    Shellfish Allergy Itching   In throat only.      Medication List    STOP taking these medications   phentermine 37.5 MG tablet Commonly known as: ADIPEX-P     TAKE these medications   ALPRAZolam 1 MG tablet Commonly known as: XANAX Take 1 tablet (1 mg total) by mouth 3 (three) times daily as needed for anxiety.   amLODipine 5 MG  tablet Commonly known as: NORVASC Take 1 tablet (5 mg total) by mouth daily.   buPROPion 300 MG 24 hr tablet Commonly known as: WELLBUTRIN XL Take 1 tablet (300 mg total) by mouth daily. Needs office visit prior to next refill   carvedilol 6.25 MG tablet Commonly known as: COREG Take 1 tablet (6.25 mg total) by mouth 2 (two) times daily with a meal.   cetirizine 10 MG tablet Commonly known as: ZYRTEC Take 10 mg by mouth daily with breakfast.   DULoxetine 60 MG capsule Commonly known as: CYMBALTA TAKE 1 CAPSULE (60 MG TOTAL) BY MOUTH DAILY. NEEDS OFFICE VISIT PRIOR TO NEXT REFILL   Flovent HFA 110 MCG/ACT inhaler Generic drug: fluticasone Inhale 2 puffs into the lungs 2 (two) times daily.   fluticasone 50 MCG/ACT nasal spray Commonly known as: FLONASE Place 1 spray into both nostrils every morning.   furosemide 40 MG tablet Commonly known as: LASIX Take 1 tablet (40 mg total) by mouth daily.   losartan 25 MG tablet Commonly known as: COZAAR Take 1 tablet (25 mg total) by mouth daily.   montelukast 10 MG tablet Commonly known as: SINGULAIR Take 1 tablet (10 mg total) by mouth at bedtime.   omeprazole 40 MG capsule Commonly known as: PRILOSEC Take 1 capsule (40 mg total) by mouth daily.   potassium chloride SA 20 MEQ tablet Commonly known as: K-DUR Take 1 tablet (20 mEq total) by mouth daily.   ProAir HFA 108 (90 Base) MCG/ACT inhaler Generic drug: albuterol Inhale 2 puffs into the lungs every 6 (six) hours as needed for wheezing or shortness of breath.   albuterol (2.5 MG/3ML) 0.083% nebulizer solution Commonly known as: PROVENTIL Take 3 mLs (2.5 mg total) by nebulization every 6 (six) hours as needed for wheezing or shortness of breath.       Procedures/Studies: Ct Angio Chest Pe W And/or Wo Contrast  Result Date: 10/05/2018 CLINICAL DATA:  PE suspected EXAM: CT ANGIOGRAPHY CHEST WITH CONTRAST TECHNIQUE: Multidetector CT imaging of the chest was performed  using the standard protocol during bolus administration of intravenous contrast. Multiplanar CT image reconstructions and MIPs were obtained to evaluate the vascular anatomy. CONTRAST:  OMNIPAQUE IOHEXOL 350 MG/ML SOLN COMPARISON:  None. FINDINGS: Cardiovascular: Contrast injection is sufficient to demonstrate satisfactory opacification of the pulmonary arteries to the segmental  level. There is no pulmonary embolus. The main pulmonary artery is within normal limits for size. There is no CT evidence of acute right heart strain. The visualized aorta is normal. Heart size is enlarged. Mediastinum/Nodes: --No mediastinal or hilar lymphadenopathy. --No axillary lymphadenopathy. --No supraclavicular lymphadenopathy. --Normal thyroid gland. --The esophagus is unremarkable Lungs/Pleura: There is elevation of the right hemidiaphragm with compressive atelectasis involving the right lung base. There is no pneumothorax. No large pleural effusion. The trachea is unremarkable. Upper Abdomen: A lap band is in place. There is no acute abnormality detected in the partially visualized upper abdomen. Musculoskeletal: No chest wall abnormality. No acute or significant osseous findings. Review of the MIP images confirms the above findings. IMPRESSION: 1. No PE. 2. Cardiomegaly. 3. Elevation of the right hemidiaphragm with compressive atelectasis at the right lung base. Electronically Signed   By: Katherine Mantlehristopher  Green M.D.   On: 10/05/2018 23:41   Dg Chest Port 1 View  Result Date: 10/05/2018 CLINICAL DATA:  Increasing wheezing and shortness of breath EXAM: PORTABLE CHEST 1 VIEW COMPARISON:  12/16/2014 FINDINGS: Cardiac shadow is enlarged but accentuated by the portable technique. Mild vascular congestion is noted without interstitial edema. No definitive infiltrate is seen. No bony abnormality is noted. IMPRESSION: Mild vascular congestion without interstitial edema. Electronically Signed   By: Alcide CleverMark  Lukens M.D.   On: 10/05/2018  19:18   Vas [place] Lower Extremity Venous (dvt)  Result Date: 10/06/2018  Lower Venous Study Indications: Edema.  Risk Factors: None identified. Limitations: Poor ultrasound/tissue interface and body habitus. Comparison Study: No prior studies. Performing Technologist: Chanda BusingGregory Collins RVT  Examination Guidelines: A complete evaluation includes B-mode imaging, spectral Doppler, color Doppler, and power Doppler as needed of all accessible portions of each vessel. Bilateral testing is considered an integral part of a complete examination. Limited examinations for reoccurring indications may be performed as noted.  +---------+---------------+---------+-----------+----------+-------+ RIGHT    CompressibilityPhasicitySpontaneityPropertiesSummary +---------+---------------+---------+-----------+----------+-------+ CFV      Full           Yes      Yes                          +---------+---------------+---------+-----------+----------+-------+ SFJ      Full                                                 +---------+---------------+---------+-----------+----------+-------+ FV Prox  Full                                                 +---------+---------------+---------+-----------+----------+-------+ FV Mid   Full                                                 +---------+---------------+---------+-----------+----------+-------+ FV DistalFull                                                 +---------+---------------+---------+-----------+----------+-------+ PFV      Full                                                 +---------+---------------+---------+-----------+----------+-------+  POP      Full           Yes      Yes                          +---------+---------------+---------+-----------+----------+-------+ PTV      Full                                                 +---------+---------------+---------+-----------+----------+-------+ PERO     Full                                                  +---------+---------------+---------+-----------+----------+-------+   +---------+---------------+---------+-----------+----------+-------+ LEFT     CompressibilityPhasicitySpontaneityPropertiesSummary +---------+---------------+---------+-----------+----------+-------+ CFV      Full           Yes      Yes                          +---------+---------------+---------+-----------+----------+-------+ SFJ      Full                                                 +---------+---------------+---------+-----------+----------+-------+ FV Prox  Full                                                 +---------+---------------+---------+-----------+----------+-------+ FV Mid   Full                                                 +---------+---------------+---------+-----------+----------+-------+ FV DistalFull           Yes      Yes                          +---------+---------------+---------+-----------+----------+-------+ PFV      Full                                                 +---------+---------------+---------+-----------+----------+-------+ POP      Full           Yes      Yes                          +---------+---------------+---------+-----------+----------+-------+ PTV      Full                                                 +---------+---------------+---------+-----------+----------+-------+ PERO     Full                                                 +---------+---------------+---------+-----------+----------+-------+  Summary: Right: There is no evidence of deep vein thrombosis in the lower extremity. However, portions of this examination were limited- see technologist comments above. No cystic structure found in the popliteal fossa. Left: There is no evidence of deep vein thrombosis in the lower extremity. However, portions of this examination were limited- see technologist comments  above. No cystic structure found in the popliteal fossa.  *See table(s) above for measurements and observations.    Preliminary       Subjective: Pt says she feels much less SOB and no headaches, she denies CP and SOB.    Discharge Exam: Vitals:   10/07/18 0615 10/07/18 0819  BP: (!) 147/100   Pulse: 90 86  Resp:  15  Temp:    SpO2:  98%   Vitals:   10/06/18 2033 10/07/18 0607 10/07/18 0615 10/07/18 0819  BP: (!) 147/103 (!) 136/110 (!) 147/100   Pulse: (!) 102 (!) 103 90 86  Resp: Temp: 98.1 F (36.7 C) 98.4 F (36.9 C)    TempSrc: Oral     SpO2: (!) 87% 96%  98%  Weight:      Height:       General: Pt is alert, awake, not in acute distress Cardiovascular: normal S1/S2 +, no rubs, no gallops Respiratory: CTA bilaterally, no wheezing, no rhonchi Abdominal: Soft, NT, ND, bowel sounds + Extremities: 1+ pitting pretibial edema BLEs, no cyanosis   The results of significant diagnostics from this hospitalization (including imaging, microbiology, ancillary and laboratory) are listed below for reference.     Microbiology: Recent Results (from the past 240 hour(s))  SARS Coronavirus 2 (CEPHEID - Performed in [facility] hospital lab), Hosp Order     Status: None   Collection Time: 10/05/18  9:06 PM   Specimen: Nasopharyngeal Swab  Result Value Ref Range Status   SARS Coronavirus 2 NEGATIVE NEGATIVE Final    Comment: (NOTE) If result is NEGATIVE SARS-CoV-2 target nucleic acids are NOT DETECTED. The SARS-CoV-2 RNA is generally detectable in upper and lower  respiratory specimens during the acute phase of infection. The lowest  concentration of SARS-CoV-2 viral copies this assay can detect is 250  copies / mL. A negative result does not preclude SARS-CoV-2 infection  and should not be used as the sole basis for treatment or other  patient management decisions.  A negative result may occur with  improper specimen collection / handling, submission of specimen other   than nasopharyngeal swab, presence of viral mutation(s) within the  areas targeted by this assay, and inadequate number of viral copies  (<250 copies / mL). A negative result must be combined with clinical  observations, patient history, and epidemiological information. If result is POSITIVE SARS-CoV-2 target nucleic acids are DETECTED. The SARS-CoV-2 RNA is generally detectable in upper and lower  respiratory specimens dur ing the acute phase of infection.  Positive  results are indicative of active infection with SARS-CoV-2.  Clinical  correlation with patient history and other diagnostic information is  necessary to determine patient infection status.  Positive results do  not rule out bacterial infection or co-infection with other viruses. If result is PRESUMPTIVE POSTIVE SARS-CoV-2 nucleic acids MAY BE PRESENT.   A presumptive positive result was obtained on the submitted specimen  and confirmed on repeat testing.  While 2019 novel coronavirus  (SARS-CoV-2) nucleic acids may be present in the submitted sample  additional confirmatory testing may be necessary for epidemiological  and / or clinical  management purposes  to differentiate between  SARS-CoV-2 and other Sarbecovirus currently known to infect humans.  If clinically indicated additional testing with an alternate test  methodology [phone] is advised. The SARS-CoV-2 RNA is generally  detectable in upper and lower respiratory sp ecimens during the acute  phase of infection. The expected result is Negative. Fact Sheet for Patients:  BoilerBrush.com.cy Fact Sheet for Healthcare Providers: https://pope.com/ This test is not yet approved or cleared by the [place] FDA and has been authorized for detection and/or diagnosis of SARS-CoV-2 by FDA under an Emergency Use Authorization (EUA).  This EUA will remain in effect (meaning this test can be used) for the duration of  the COVID-19 declaration under Section 564(b)(1) of the Act, 21 U.S.C. section 360bbb-3(b)(1), unless the authorization is terminated or revoked sooner. Performed at [facility], [address] [place], [place] [address]      Labs: BNP (last 3 results) Recent Labs    10/05/18 2029  BNP 86.2   Basic Metabolic Panel: Recent Labs  Lab 10/05/18 2029 10/06/18 0403  NA 138 137  K 3.9 4.0  CL 105 104  CO2 23 22  GLUCOSE 110* 186*  BUN 15 13  CREATININE 0.80 0.81  CALCIUM 8.6* 8.8*   Liver Function Tests: No results for input(s): AST, ALT, ALKPHOS, BILITOT, PROT, ALBUMIN in the last 168 hours. No results for input(s): LIPASE, AMYLASE in the last 168 hours. No results for input(s): AMMONIA in the last 168 hours. CBC: Recent Labs  Lab 10/05/18 2029 10/06/18 0403  WBC 9.3 9.5  NEUTROABS 6.0  --   HGB 12.7 13.4  HCT 41.0 42.5  MCV 89.5 89.1  PLT 211 239   Cardiac Enzymes: No results for input(s): CKTOTAL, CKMB, CKMBINDEX, TROPONINI in the last 168 hours. BNP: Invalid input(s): POCBNP CBG: No results for input(s): GLUCAP in the last 168 hours. D-Dimer Recent Labs    10/05/18 2029  DDIMER 0.64*   Hgb A1c No results for input(s): HGBA1C in the last 72 hours. Lipid Profile No results for input(s): CHOL, HDL, LDLCALC, TRIG, CHOLHDL, LDLDIRECT in the last 72 hours. Thyroid function studies Recent Labs    10/05/18 2353 10/05/18 2357  TSH 0.578  --   T4TOTAL  --  8.0   Anemia work up No results for input(s): VITAMINB12, FOLATE, FERRITIN, TIBC, IRON, RETICCTPCT in the last 72 hours. Urinalysis No results found for: COLORURINE, APPEARANCEUR, LABSPEC, PHURINE, GLUCOSEU, HGBUR, BILIRUBINUR, KETONESUR, PROTEINUR, UROBILINOGEN, NITRITE, LEUKOCYTESUR Sepsis Labs Invalid input(s): PROCALCITONIN,  WBC,  LACTICIDVEN Microbiology Recent Results (from the past 240 hour(s))  SARS Coronavirus 2 (CEPHEID - Performed in [facility] hospital lab), Hosp  Order     Status: None   Collection Time: 10/05/18  9:06 PM   Specimen: Nasopharyngeal Swab  Result Value Ref Range Status   SARS Coronavirus 2 NEGATIVE NEGATIVE Final    Comment: (NOTE) If result is NEGATIVE SARS-CoV-2 target nucleic acids are NOT DETECTED. The SARS-CoV-2 RNA is generally detectable in upper and lower  respiratory specimens during the acute phase of infection. The lowest  concentration of SARS-CoV-2 viral copies this assay can detect is 250  copies / mL. A negative result does not preclude SARS-CoV-2 infection  and should not be used as the sole basis for treatment or other  patient management decisions.  A negative result may occur with  improper specimen collection / handling, submission of specimen other  than nasopharyngeal swab, presence of viral mutation(s) within the  areas targeted by this assay, and inadequate number of viral copies  (<250 copies / mL). A negative result must be combined with clinical  observations, patient history, and epidemiological information. If result is POSITIVE SARS-CoV-2 target nucleic acids are DETECTED. The SARS-CoV-2 RNA is generally detectable in upper and lower  respiratory specimens dur ing the acute phase of infection.  Positive  results are indicative of active infection with SARS-CoV-2.  Clinical  correlation with patient history and other diagnostic information is  necessary to determine patient infection status.  Positive results do  not rule out bacterial infection or co-infection with other viruses. If result is PRESUMPTIVE POSTIVE SARS-CoV-2 nucleic acids MAY BE PRESENT.   A presumptive positive result was obtained on the submitted specimen  and confirmed on repeat testing.  While 2019 novel coronavirus  (SARS-CoV-2) nucleic acids may be present in the submitted sample  additional confirmatory testing may be necessary for epidemiological  and / or clinical management purposes  to differentiate between  SARS-CoV-2  and other Sarbecovirus currently known to infect humans.  If clinically indicated additional testing with an alternate test  methodology [phone]) is advised. The SARS-CoV-2 RNA is generally  detectable in upper and lower respiratory sp ecimens during the acute  phase of infection. The expected result is Negative. Fact Sheet for Patients:  BoilerBrush.com.cyhttps://www.fda.gov/media/136312/download Fact Sheet for Healthcare Providers: https://pope.com/https://www.fda.gov/media/136313/download This test is not yet approved or cleared by the [place] FDA and has been authorized for detection and/or diagnosis of SARS-CoV-2 by FDA under an Emergency Use Authorization (EUA).  This EUA will remain in effect (meaning this test can be used) for the duration of the COVID-19 declaration under Section 564(b)(1) of the Act, 21 U.S.C. section 360bbb-3(b)(1), unless the authorization is terminated or revoked sooner. Performed at [facility], [address] [place], [place] [address]     Time coordinating discharge:   SIGNED:  Standley Dakinslanford Genecis Veley, MD  [facility] 10/07/2018, 9:16 AM How to contact the [facility] Attending or Consulting provider 7A - 7P or covering provider during after hours 7P -7A, for this patient?  1. Check the care team in [facility] and look for a) attending/consulting [facility] provider listed and b) the [facility] team listed 2. Log into [email or web address] and use 's universal password to access. If you do not have the password, please contact the hospital operator. 3. Locate the [facility] provider you are looking for under [facility] and page to a number that you can be directly reached. 4. If you still have difficulty reaching the provider, please page the [facility] (Director on Call) for the Hospitalists listed on amion for assistance.

## 2018-10-07 NOTE — Discharge Instructions (Signed)
Managing Your Hypertension Hypertension is commonly called high blood pressure. This is when the force of your blood pressing against the walls of your arteries is too strong. Arteries are blood vessels that carry blood from your heart throughout your body. Hypertension forces the heart to work harder to pump blood, and may cause the arteries to become narrow or stiff. Having untreated or uncontrolled hypertension can cause heart attack, stroke, kidney disease, and other problems. What are blood pressure readings? A blood pressure reading consists of a higher number over a lower number. Ideally, your blood pressure should be below 120/80. The first ("top") number is called the systolic pressure. It is a measure of the pressure in your arteries as your heart beats. The second ("bottom") number is called the diastolic pressure. It is a measure of the pressure in your arteries as the heart relaxes. What does my blood pressure reading mean? Blood pressure is classified into four stages. Based on your blood pressure reading, your health care provider may use the following stages to determine what type of treatment you need, if any. Systolic pressure and diastolic pressure are measured in a unit called mm Hg. Normal  Systolic pressure: below 078.  Diastolic pressure: below 80. Elevated  Systolic pressure: 675-449.  Diastolic pressure: below 80. Hypertension stage 1  Systolic pressure: 201-007.  Diastolic pressure: 12-19. Hypertension stage 2  Systolic pressure: 758 or above.  Diastolic pressure: 90 or above. What health risks are associated with hypertension? Managing your hypertension is an important responsibility. Uncontrolled hypertension can lead to:  A heart attack.  A stroke.  A weakened blood vessel (aneurysm).  Heart failure.  Kidney damage.  Eye damage.  Metabolic syndrome.  Memory and concentration problems. What changes can I make to manage my  hypertension? Hypertension can be managed by making lifestyle changes and possibly by taking medicines. Your health care provider will help you make a plan to bring your blood pressure within a normal range. Eating and drinking   Eat a diet that is high in fiber and potassium, and low in salt (sodium), added sugar, and fat. An example eating plan is called the DASH (Dietary Approaches to Stop Hypertension) diet. To eat this way: ? Eat plenty of fresh fruits and vegetables. Try to fill half of your plate at each meal with fruits and vegetables. ? Eat whole grains, such as whole wheat pasta, brown rice, or whole grain bread. Fill about one quarter of your plate with whole grains. ? Eat low-fat diary products. ? Avoid fatty cuts of meat, processed or cured meats, and poultry with skin. Fill about one quarter of your plate with lean proteins such as fish, chicken without skin, beans, eggs, and tofu. ? Avoid premade and processed foods. These tend to be higher in sodium, added sugar, and fat.  Reduce your daily sodium intake. Most people with hypertension should eat less than 1,500 mg of sodium a day.  Limit alcohol intake to no more than 1 drink a day for nonpregnant women and 2 drinks a day for men. One drink equals 12 oz of beer, 5 oz of wine, or 1 oz of hard liquor. Lifestyle  Work with your health care provider to maintain a healthy body weight, or to lose weight. Ask what an ideal weight is for you.  Get at least 30 minutes of exercise that causes your heart to beat faster (aerobic exercise) most days of the week. Activities may include walking, swimming, or biking.  Include exercise  to strengthen your muscles (resistance exercise), such as weight lifting, as part of your weekly exercise routine. Try to do these types of exercises for 30 minutes at least 3 days a week.  Do not use any products that contain nicotine or tobacco, such as cigarettes and e-cigarettes. If you need help quitting,  ask your health care provider.  Control any long-term (chronic) conditions you have, such as high cholesterol or diabetes. Monitoring  Monitor your blood pressure at home as told by your health care provider. Your personal target blood pressure may vary depending on your medical conditions, your age, and other factors.  Have your blood pressure checked regularly, as often as told by your health care provider. Working with your health care provider  Review all the medicines you take with your health care provider because there may be side effects or interactions.  Talk with your health care provider about your diet, exercise habits, and other lifestyle factors that may be contributing to hypertension.  Visit your health care provider regularly. Your health care provider can help you create and adjust your plan for managing hypertension. Will I need medicine to control my blood pressure? Your health care provider may prescribe medicine if lifestyle changes are not enough to get your blood pressure under control, and if:  Your systolic blood pressure is 130 or higher.  Your diastolic blood pressure is 80 or higher. Take medicines only as told by your health care provider. Follow the directions carefully. Blood pressure medicines must be taken as prescribed. The medicine does not work as well when you skip doses. Skipping doses also puts you at risk for problems. Contact a health care provider if:  You think you are having a reaction to medicines you have taken.  You have repeated (recurrent) headaches.  You feel dizzy.  You have swelling in your ankles.  You have trouble with your vision. Get help right away if:  You develop a severe headache or confusion.  You have unusual weakness or numbness, or you feel faint.  You have severe pain in your chest or abdomen.  You vomit repeatedly.  You have trouble breathing. Summary  Hypertension is when the force of blood pumping  through your arteries is too strong. If this condition is not controlled, it may put you at risk for serious complications.  Your personal target blood pressure may vary depending on your medical conditions, your age, and other factors. For most people, a normal blood pressure is less than 120/80.  Hypertension is managed by lifestyle changes, medicines, or both. Lifestyle changes include weight loss, eating a healthy, low-sodium diet, exercising more, and limiting alcohol. This information is not intended to replace advice given to you by your health care provider. Make sure you discuss any questions you have with your health care provider. Document Released: 12/08/2011 Document Revised: 07/07/2018 Document Reviewed: 02/11/2016 Elsevier Patient Education  2020 [employer].   Shortness of Breath, Adult Shortness of breath means you have trouble breathing. Shortness of breath could be a sign of a medical problem. Follow these instructions at home:   Watch for any changes in your symptoms.  Do not use any products that contain nicotine or tobacco, such as cigarettes, e-cigarettes, and chewing tobacco.  Do not smoke. Smoking can cause shortness of breath. If you need help to quit smoking, ask your doctor.  Avoid things that can make it harder to breathe, such as: ? Mold. ? Dust. ? Air pollution. ? Chemical smells. ?  Things that can cause allergy symptoms (allergens), if you have allergies.  Keep your living space clean. Use products that help remove mold and dust.  Rest as needed. Slowly return to your normal activities.  Take over-the-counter and prescription medicines only as told by your doctor. This includes oxygen therapy and inhaled medicines.  Keep all follow-up visits as told by your doctor. This is important. Contact a doctor if:  Your condition does not get better as soon as expected.  You have a hard time doing your normal activities, even after you rest.  You have  new symptoms. Get help right away if:  Your shortness of breath gets worse.  You have trouble breathing when you are resting.  You feel light-headed or you pass out (faint).  You have a cough that is not helped by medicines.  You cough up blood.  You have pain with breathing.  You have pain in your chest, arms, shoulders, or belly (abdomen).  You have a fever.  You cannot walk up stairs.  You cannot exercise the way you normally do. These symptoms may represent a serious problem that is an emergency. Do not wait to see if the symptoms will go away. Get medical help right away. Call your local emergency services (911 in the [place] Do not drive yourself to the hospital. Summary  Shortness of breath is when you have trouble breathing enough air. It can be a sign of a medical problem.  Avoid things that make it hard for you to breathe, such as smoking, pollution, mold, and dust.  Watch for any changes in your symptoms. Contact your doctor if you do not get better or you get worse. This information is not intended to replace advice given to you by your health care provider. Make sure you discuss any questions you have with your health care provider. Document Released: 09/01/2007 Document Revised: 08/15/2017 Document Reviewed: 08/15/2017 Elsevier Patient Education  2020 [place].   IMPORTANT INFORMATION: PAY CLOSE ATTENTION   PHYSICIAN DISCHARGE INSTRUCTIONS  Follow with Primary care provider  Rutherford Guys, MD  and other consultants as instructed by your Hospitalist Physician  [place] IF SYMPTOMS COME BACK, WORSEN OR NEW PROBLEM DEVELOPS   Please note: You were cared for by a hospitalist during your hospital stay. Every effort will be made to forward records to your primary care provider.  You can request that your primary care provider send for your hospital records if they have not received them.  Once you are discharged, your  primary care physician will handle any further medical issues. Please note that NO REFILLS for any discharge medications will be authorized once you are discharged, as it is imperative that you return to your primary care physician (or establish a relationship with a primary care physician if you do not have one) for your post hospital discharge needs so that they can reassess your need for medications and monitor your lab values.  Please get a complete blood count and chemistry panel checked by your Primary MD at your next visit, and again as instructed by your Primary MD.  Get Medicines reviewed and adjusted: Please take all your medications with you for your next visit with your Primary MD  Laboratory/radiological data: Please request your Primary MD to go over all hospital tests and procedure/radiological results at the follow up, please ask your primary care provider to get all Hospital records sent to his/her office.  In some  cases, they will be blood work, cultures and biopsy results pending at the time of your discharge. Please request that your primary care provider follow up on these results.  If you are diabetic, please bring your blood sugar readings with you to your follow up appointment with primary care.    Please call and make your follow up appointments as soon as possible.    Also Note the following: If you experience worsening of your admission symptoms, develop shortness of breath, life threatening emergency, suicidal or homicidal thoughts you must seek medical attention immediately by calling 911 or calling your MD immediately  if symptoms less severe.  You must read complete instructions/literature along with all the possible adverse reactions/side effects for all the Medicines you take and that have been prescribed to you. Take any new Medicines after you have completely understood and accpet all the possible adverse reactions/side effects.   Do not drive when taking Pain  medications or sleeping medications (Benzodiazepines)  Do not take more than prescribed Pain, Sleep and Anxiety Medications. It is not advisable to combine anxiety,sleep and pain medications without talking with your primary care practitioner  Special Instructions: If you have smoked or chewed Tobacco  in the last 2 yrs please stop smoking, stop any regular Alcohol  and or any Recreational drug use.  Wear Seat belts while driving.  Do not drive if taking any narcotic, mind altering or controlled substances or recreational drugs or alcohol.

## 2018-10-09 ENCOUNTER — Ambulatory Visit: Payer: BC Managed Care – PPO | Admitting: Allergy and Immunology

## 2018-10-10 ENCOUNTER — Other Ambulatory Visit: Payer: Self-pay

## 2018-10-10 ENCOUNTER — Encounter: Payer: Self-pay | Admitting: Family Medicine

## 2018-10-10 ENCOUNTER — Ambulatory Visit: Payer: BC Managed Care – PPO | Admitting: Family Medicine

## 2018-10-10 VITALS — BP 130/80 | HR 96 | Temp 98.5°F | Ht 62.0 in | Wt 317.0 lb

## 2018-10-10 DIAGNOSIS — I5021 Acute systolic (congestive) heart failure: Secondary | ICD-10-CM | POA: Diagnosis not present

## 2018-10-10 DIAGNOSIS — F418 Other specified anxiety disorders: Secondary | ICD-10-CM | POA: Diagnosis not present

## 2018-10-10 DIAGNOSIS — I10 Essential (primary) hypertension: Secondary | ICD-10-CM

## 2018-10-10 DIAGNOSIS — I517 Cardiomegaly: Secondary | ICD-10-CM | POA: Diagnosis not present

## 2018-10-10 NOTE — Progress Notes (Signed)
7/14/20204:52 PM  Samantha Romero 08-Feb-1976, 43 y.o., female [id number]  No chief complaint on file.   HPI:   Patient is a 43 y.o. female with past medical history significant for HTN, asthma, GERD, depression, anxiety, gastric lap band, fibromyalgia who presents today for hosp followup  Last telemedicine May 2020 Restarted her medications  hosp 10/05/2018 - 10/07/2018 Hypertensive urgency Started on losartan 25mg , coreg 6.25mg  BID, lasix 40mg   Cont amlodipine 5mg  Echo with LVEF 35-40% mod dilated Recommend repeat CBC and BMP  Telemedicine October 04 2018 - referred to sleep study  Reports that she overall is doing much better She is able to breath comfortably, able to do chores Has new scale coming in Has been trying to follow fluid restriction 1.5l  Depression and fibromyalgia are doing well on current regime Takes xanax prn, last refilled October 06 2018  Lab Results  Component Value Date   CREATININE 0.81 10/06/2018   BUN 13 10/06/2018   NA 137 10/06/2018   K 4.0 10/06/2018   CL 104 10/06/2018   CO2 22 10/06/2018   Lab Results  Component Value Date   WBC 9.5 10/06/2018   HGB 13.4 10/06/2018   HCT 42.5 10/06/2018   MCV 89.1 10/06/2018   PLT 239 10/06/2018    Depression screen PHQ 2/9 10/04/2018 08/14/2018 11/18/2016  Decreased Interest 0 0 3  Down, Depressed, Hopeless 0 0 2  PHQ - 2 Score 0 0 5  Altered sleeping - - 3  Tired, decreased energy - - 3  Change in appetite - - 3  Feeling bad or failure about yourself  - - 2  Trouble concentrating - - 3  Moving slowly or fidgety/restless - - 2  Suicidal thoughts - - 0  PHQ-9 Score - - 21  Difficult doing work/chores - - Extremely dIfficult    Fall Risk  10/04/2018 08/14/2018 11/18/2016 02/12/2015  Falls in the past year? 0 0 No No  Number falls in past yr: - 0 - -  Injury with Fall? 0 0 - -     Allergies  Allergen Reactions  . Grass Extracts [Gramineae Pollens]     Nasal congestion, post nasal drip   .  Shellfish Allergy Itching    In throat only.    Prior to Admission medications   Medication Sig Start Date End Date Taking? Authorizing Provider  albuterol (PROAIR HFA) 108 (90 Base) MCG/ACT inhaler Inhale 2 puffs into the lungs every 6 (six) hours as needed for wheezing or shortness of breath.     [provider]  albuterol (PROVENTIL) (2.5 MG/3ML) 0.083% nebulizer solution Take 3 mLs (2.5 mg total) by nebulization every 6 (six) hours as needed for wheezing or shortness of breath. 08/23/18   Myles Lipps, MD  ALPRAZolam Prudy Feeler) 1 MG tablet Take 1 tablet (1 mg total) by mouth 3 (three) times daily as needed for anxiety. 10/06/18   Myles Lipps, MD  amLODipine (NORVASC) 5 MG tablet Take 1 tablet (5 mg total) by mouth daily. 08/14/18   Myles Lipps, MD  buPROPion (WELLBUTRIN XL) 300 MG 24 hr tablet Take 1 tablet (300 mg total) by mouth daily. Needs office visit prior to next refill 10/05/18   Myles Lipps, MD  carvedilol (COREG) 6.25 MG tablet Take 1 tablet (6.25 mg total) by mouth 2 (two) times daily with a meal. 10/07/18   Johnson, Clanford L, MD  cetirizine (ZYRTEC) 10 MG tablet Take 10 mg by mouth daily with  breakfast.    [provider]  DULoxetine (CYMBALTA) 60 MG capsule TAKE 1 CAPSULE (60 MG TOTAL) BY MOUTH DAILY. NEEDS OFFICE VISIT PRIOR TO NEXT REFILL 09/28/18   Rutherford Guys, MD  fluticasone [facility]) 50 MCG/ACT nasal spray Place 1 spray into both nostrils every morning.    [provider]  fluticasone (FLOVENT HFA) 110 MCG/ACT inhaler Inhale 2 puffs into the lungs 2 (two) times daily. 10/04/18   Corum, Rex Kras, MD  furosemide (LASIX) 40 MG tablet Take 1 tablet (40 mg total) by mouth daily. 10/07/18   Johnson, Clanford L, MD  losartan (COZAAR) 25 MG tablet Take 1 tablet (25 mg total) by mouth daily. 10/07/18   Johnson, Clanford L, MD  montelukast (SINGULAIR) 10 MG tablet Take 1 tablet (10 mg total) by mouth at bedtime. 08/14/18   Rutherford Guys, MD   omeprazole (PRILOSEC) 40 MG capsule Take 1 capsule (40 mg total) by mouth daily. 10/04/18   Corum, Rex Kras, MD  potassium chloride SA (K-DUR) 20 MEQ tablet Take 1 tablet (20 mEq total) by mouth daily. 10/07/18   Murlean Iba, MD    Past Medical History:  Diagnosis Date  . Anxiety    panic attacks  . Asthma   . CHF (congestive heart failure), NYHA class II, acute, systolic ([place]) 09/27/6376  . Depression   . Fibromyalgia   . Hypertension    no meds currently  . No pertinent past medical history   . Pneumonia    hx of   . Sinus problem     Past Surgical History:  Procedure Laterality Date  . ESOPHAGOGASTRODUODENOSCOPY N/A 11/26/2014   Procedure: ESOPHAGOGASTRODUODENOSCOPY (EGD);  Surgeon: Alphonsa Overall, MD;  Location: [facility];  Service: General;  Laterality: N/A;  . GASTRIC BANDING PORT REVISION  08/24/11  . LAPAROSCOPIC GASTRIC BANDING  12/26/2007  . LAPAROSCOPIC LYSIS OF ADHESIONS N/A 01/06/2015   Procedure: LAPAROSCOPIC LYSIS OF ADHESIONS;  Surgeon: Johnathan Hausen, MD;  Location: [facility];  Service: General;  Laterality: N/A;  . LAPAROSCOPIC REPAIR AND REMOVAL OF GASTRIC BAND N/A 01/06/2015   Procedure: LAPAROSCOPIC RESIGHTING OF GASTRIC Band PORT;  Surgeon: Johnathan Hausen, MD;  Location: [facility];  Service: General;  Laterality: N/A;  . NO PAST SURGERIES      Social History   Tobacco Use  . Smoking status: Never Smoker  . Smokeless tobacco: Never Used  Substance Use Topics  . Alcohol use: Yes    Comment: socially    Family History  Problem Relation Age of Onset  . Cancer Maternal Uncle        lung  . Cancer Maternal Grandfather        lung and back    Review of Systems  Constitutional: Negative for chills and fever.  Respiratory: Negative for cough and shortness of breath.   Cardiovascular: Negative for chest pain, palpitations and leg swelling.  Gastrointestinal: Negative for abdominal pain, nausea and vomiting.     OBJECTIVE:  Today's Vitals   10/10/18  1658  BP: 130/80  Pulse: 96  Temp: 98.5 F (36.9 C)  TempSrc: Oral  SpO2: 99%  Weight: (!) 317 lb (143.8 kg)  Height: 5\' 2"  (1.575 m)   Body mass index is 57.98 kg/m.  Wt Readings from Last 3 Encounters:  10/10/18 (!) 317 lb (143.8 kg)  10/06/18 (!) 328 lb 14.8 oz (149.2 kg)  10/04/18 (!) 326 lb (147.9 kg)    Physical Exam Vitals signs and nursing note reviewed.  Constitutional:      Appearance: She is well-developed.  HENT:     Head: Normocephalic and atraumatic.     Mouth/Throat:     Pharynx: No oropharyngeal exudate.  Eyes:     General: No scleral icterus.    Conjunctiva/sclera: Conjunctivae normal.     Pupils: Pupils are equal, round, and reactive to light.  Neck:     Musculoskeletal: Neck supple.  Cardiovascular:     Rate and Rhythm: Normal rate and regular rhythm.     Heart sounds: Normal heart sounds. No murmur. No friction rub. No gallop.   Pulmonary:     Effort: Pulmonary effort is normal.     Breath sounds: Normal breath sounds. No wheezing or rales.  Musculoskeletal:     Right lower leg: No edema.     Left lower leg: No edema.  Skin:    General: Skin is warm and dry.  Neurological:     Mental Status: She is alert and oriented to person, place, and time.     ASSESSMENT and PLAN  1. CHF (congestive heart failure), NYHA class II, acute, systolic ([facility]) 2. Cardiomegaly 3. Essential hypertension Significantly improved, controlled. Cont current regime. Referral to cards. Cont with LFM. - Ambulatory referral to Cardiology - CBC - Basic Metabolic Panel  4. Depression with anxiety Controlled. Continue current regime. She will call when xanax refill is due.  Return in about 4 weeks (around 11/07/2018).    Myles LippsIrma M Santiago, MD [facility] [address] [place], [place] [address] Ph.  [phone] Fax [phone]

## 2018-10-10 NOTE — Patient Instructions (Signed)
° ° ° °  If you have lab work done today you will be contacted with your lab results within the next 2 weeks.  If you have not heard from us then please contact us. The fastest way to get your results is to register for My Chart. ° ° °IF you received an x-ray today, you will receive an invoice from [facility]. Please contact [facility] at [phone] with questions or concerns regarding your invoice.  ° °IF you received labwork today, you will receive an invoice from [facility]. Please contact [facility] at [phone] with questions or concerns regarding your invoice.  ° °Our billing staff will not be able to assist you with questions regarding bills from these companies. ° °You will be contacted with the lab results as soon as they are available. The fastest way to get your results is to activate your My Chart account. Instructions are located on the last page of this paperwork. If you have not heard from us regarding the results in 2 weeks, please contact this office. °  ° ° ° °

## 2018-10-11 LAB — CBC
Hematocrit: 41 % (ref 34.0–46.6)
Hemoglobin: 13.2 g/dL (ref 11.1–15.9)
MCH: 27.7 pg (ref 26.6–33.0)
MCHC: 32.2 g/dL (ref 31.5–35.7)
MCV: 86 fL (ref 79–97)
Platelets: 268 10*3/uL (ref 150–450)
RBC: 4.77 x10E6/uL (ref 3.77–5.28)
RDW: 12.9 % (ref 11.7–15.4)
WBC: 8.9 10*3/uL (ref 3.4–10.8)

## 2018-10-11 LAB — BASIC METABOLIC PANEL
BUN/Creatinine Ratio: 12 (ref 9–23)
BUN: 12 mg/dL (ref 6–24)
CO2: 21 mmol/L (ref 20–29)
Calcium: 8.7 mg/dL (ref 8.7–10.2)
Chloride: 101 mmol/L (ref 96–106)
Creatinine, Ser: 1.04 mg/dL — ABNORMAL HIGH (ref 0.57–1.00)
GFR calc Af Amer: 77 mL/min/{1.73_m2} (ref >59)
GFR calc non Af Amer: 67 mL/min/{1.73_m2} (ref >59)
Glucose: 72 mg/dL (ref 65–99)
Potassium: 4.1 mmol/L (ref 3.5–5.2)
Sodium: 142 mmol/L (ref 134–144)

## 2018-10-12 ENCOUNTER — Ambulatory Visit: Payer: BC Managed Care – PPO | Admitting: Family Medicine

## 2018-10-19 ENCOUNTER — Telehealth: Payer: Self-pay | Admitting: *Deleted

## 2018-10-19 ENCOUNTER — Ambulatory Visit (INDEPENDENT_AMBULATORY_CARE_PROVIDER_SITE_OTHER): Payer: BC Managed Care – PPO | Admitting: Allergy

## 2018-10-19 ENCOUNTER — Other Ambulatory Visit: Payer: Self-pay

## 2018-10-19 ENCOUNTER — Encounter: Payer: Self-pay | Admitting: Allergy

## 2018-10-19 VITALS — BP 122/88 | HR 95 | Temp 97.2°F | Resp 16 | Ht 62.0 in | Wt 317.2 lb

## 2018-10-19 DIAGNOSIS — K219 Gastro-esophageal reflux disease without esophagitis: Secondary | ICD-10-CM

## 2018-10-19 DIAGNOSIS — J3089 Other allergic rhinitis: Secondary | ICD-10-CM

## 2018-10-19 DIAGNOSIS — J4540 Moderate persistent asthma, uncomplicated: Secondary | ICD-10-CM

## 2018-10-19 DIAGNOSIS — T781XXD Other adverse food reactions, not elsewhere classified, subsequent encounter: Secondary | ICD-10-CM

## 2018-10-19 MED ORDER — MOMETASONE FURO-FORMOTEROL FUM 200-5 MCG/ACT IN AERO: 2.0000 | INHALATION_SPRAY | Freq: Two times a day (BID) | RESPIRATORY_TRACT | 5 refills | Status: DC

## 2018-10-19 MED ORDER — LEVALBUTEROL TARTRATE 45 MCG/ACT IN AERO: 2.0000 | INHALATION_SPRAY | Freq: Four times a day (QID) | RESPIRATORY_TRACT | 1 refills | Status: DC | PRN

## 2018-10-19 MED ORDER — LEVALBUTEROL HCL 1.25 MG/3ML IN NEBU: 1.2500 mg | INHALATION_SOLUTION | Freq: Four times a day (QID) | RESPIRATORY_TRACT | 5 refills | Status: DC | PRN

## 2018-10-19 MED ORDER — AZELASTINE HCL 0.1 % NA SOLN: 2.0000 | Freq: Two times a day (BID) | NASAL | 5 refills | Status: DC

## 2018-10-19 NOTE — Patient Instructions (Addendum)
Asthma   - stop Flovent  - start Dulera 263mcg 2 puffs twice a day with spacer  - continue Singulair 10mg  daily at bedtime  - have access to Xopenex inhaler 2 puffs every 4-6 hours as needed for cough/wheeze/shortness of breath/chest tightness.  May use 15-20 minutes prior to activity.   Monitor frequency of use.    Asthma control goals:   Full participation in all desired activities (may need albuterol before activity)  Albuterol use two time or less a week on average (not counting use with activity)  Cough interfering with sleep two time or less a month  Oral steroids no more than once a year  No hospitalizations  Allergies   - post-nasal drip predominant symptom  - recommend use of nasal antihistamine, Astelin 2 sprays each nostril twice a day to help manage post-nasal drip  - can continue use of Flonase 2 sprays each nostril daily as needed if having any nasal congestion  - continue Zyrtec 10mg  daily  - will obtain environmental allergy panel  Reflux  - continue use of omeprazole  - continue to avoid eating 1-2 hours before bedtime  Adverse food reaction   - continue avoidance of shellfish  - have access to self-injectable epinephrine (Epipen or AuviQ) 0.3mg  at all times  - follow emergency action plan in case of allergic reaction  - will obtain shellfish IgE levels  Follow-up 3 months or sooner if needed

## 2018-10-19 NOTE — Telephone Encounter (Signed)
Pa for levalbuterol tartrate 45mcg done in covermymeds

## 2018-10-19 NOTE — Progress Notes (Signed)
New Patient Note  RE: Samantha GobbleDeidre C Daigler MRN: [id number] DOB: 03/31/1975 Date of Office Visit: 10/19/2018  Referring provider: Myles LippsSantiago, Irma M, MD Primary care provider: Myles LippsSantiago, Irma M, MD  Chief Complaint: asthma  History of present illness: Samantha Romero is a 43 y.o. female presenting today for consultation for asthma.  She is a former patient of our practice with last visit on 05/21/14 by Dr. Lucie LeatherKozlow.    She reports she has a lot of PND.  She feels the drainage is worse when she is menstruating.   She states this is been the case since she was 43yo.   She has used flonase in the past.   She does take zyrtec daily which she feels is helpful.    She has reflux.  Dr. Lucie LeatherKozlow in the past diagnosed her with reflux she states.  Back then she was started on omeprazole and her reflux symptoms resolved.  She is back on omeprazole now as of the past week.  She tries not to eat 1-2 hours before bedtime.  She states she does not eat fried foods late any night anymore.    She has history of asthma diagnosed as a child.  She states she may have 3-4 flares a year (each season) typically treated with prednisone course.     She is on singulair now for the past month as well as Flovent 2 puffs twice a day with spacer.  She has been using albuterol neb twice a day due to symptoms mostly of chest tightness and SOB.  She states albuterol causes her heart to race and to become jittery and more anxious.  She states she was on AirDuo previously.  She states she had 2 years prior to last year where she did not have any issues with her asthma.    She earlier this summer she became very SOB and had difficulty breathing that was not getting relieved by albuterol use.  This was very stressful for her and she did not know what was going on and was very anxious.  She called her PCP who advised she needed to to go the ED.  She was admitted from 7/9-7/11 and was diagnosed with CHF.  She was found to have hypertensive  urgency.  She was found to have WBC 9.3, BNP 86.2, troponin86, negative COVID-19 test, d-dimer 0.64, temperature normal, tachycardia, tachypnea, oxygen saturation 96% on room air. Chest x-ray showed vascular congestion. CT angiogram is negative for PE.  Covid testing was negative.  She was treated for acute systolic CHF with Iv lasix.  She was started on losartan, coreg to her amolodipine.  She was discharged to continue on lasix with potassium.  She has followed up with PCP and is awaiting cardiology appt.    No history of eczema.   She reports a shellfish allergy and reports a scratchy throat and mild facial swelling after eating shrimp as a child.    Review of systems: Review of Systems  Constitutional: Negative for chills, fever, malaise/fatigue and weight loss.  HENT: Negative for congestion, ear discharge, ear pain, nosebleeds, sinus pain and sore throat.   Eyes: Negative for pain, discharge and redness.  Respiratory: Positive for cough and shortness of breath. Negative for sputum production.   Cardiovascular: Negative for chest pain.  Gastrointestinal: Positive for heartburn. Negative for abdominal pain, constipation, diarrhea, nausea and vomiting.  Musculoskeletal: Negative for joint pain.  Skin: Negative for itching and rash.  Neurological: Negative for headaches.  All other systems negative unless noted above in HPI  Past medical history: Past Medical History:  Diagnosis Date  . Anxiety    panic attacks  . Asthma   . CHF (congestive heart failure), NYHA class II, acute, systolic (HCC) 10/06/2018  . Depression   . Fibromyalgia   . Hypertension    no meds currently  . No pertinent past medical history   . Pneumonia    hx of   . Sinus problem     Past surgical history: Past Surgical History:  Procedure Laterality Date  . ESOPHAGOGASTRODUODENOSCOPY N/A 11/26/2014   Procedure: ESOPHAGOGASTRODUODENOSCOPY (EGD);  Surgeon: Ovidio Kin, MD;  Location: [facility];   Service: General;  Laterality: N/A;  . GASTRIC BANDING PORT REVISION  08/24/11  . LAPAROSCOPIC GASTRIC BANDING  12/26/2007  . LAPAROSCOPIC LYSIS OF ADHESIONS N/A 01/06/2015   Procedure: LAPAROSCOPIC LYSIS OF ADHESIONS;  Surgeon: Luretha Murphy, MD;  Location: [facility];  Service: General;  Laterality: N/A;  . LAPAROSCOPIC REPAIR AND REMOVAL OF GASTRIC BAND N/A 01/06/2015   Procedure: LAPAROSCOPIC RESIGHTING OF GASTRIC Band PORT;  Surgeon: Luretha Murphy, MD;  Location: [facility];  Service: General;  Laterality: N/A;  . NO PAST SURGERIES      Family history:  Family History  Problem Relation Age of Onset  . Cancer Maternal Uncle        lung  . Cancer Maternal Grandfather        lung and back    Social history: She lives in a home without carpeting with gas heating and central cooling.  There is no concern for roaches in the home.  Denies a smoking history.  Medication List: Allergies as of 10/19/2018      Reactions   Grass Extracts [gramineae Pollens]    Nasal congestion, post nasal drip    Shellfish Allergy Itching   In throat only.      Medication List       Accurate as of October 19, 2018  4:00 PM. If you have any questions, ask your nurse or doctor.        ALPRAZolam 1 MG tablet Commonly known as: XANAX Take 1 tablet (1 mg total) by mouth 3 (three) times daily as needed for anxiety.   amLODipine 5 MG tablet Commonly known as: NORVASC Take 1 tablet (5 mg total) by mouth daily.   buPROPion 300 MG 24 hr tablet Commonly known as: WELLBUTRIN XL Take 1 tablet (300 mg total) by mouth daily. Needs office visit prior to next refill   carvedilol 6.25 MG tablet Commonly known as: COREG Take 1 tablet (6.25 mg total) by mouth 2 (two) times daily with a meal.   cetirizine 10 MG tablet Commonly known as: ZYRTEC Take 10 mg by mouth daily with breakfast.   DULoxetine 60 MG capsule Commonly known as: CYMBALTA TAKE 1 CAPSULE (60 MG TOTAL) BY MOUTH DAILY. NEEDS OFFICE VISIT PRIOR TO  NEXT REFILL   Flovent HFA 110 MCG/ACT inhaler Generic drug: fluticasone Inhale 2 puffs into the lungs 2 (two) times daily.   fluticasone 50 MCG/ACT nasal spray Commonly known as: FLONASE Place 1 spray into both nostrils every morning.   furosemide 40 MG tablet Commonly known as: LASIX Take 1 tablet (40 mg total) by mouth daily.   losartan 25 MG tablet Commonly known as: COZAAR Take 1 tablet (25 mg total) by mouth daily.   mometasone-formoterol 200-5 MCG/ACT Aero Commonly known as: DULERA Inhale 2 puffs into the lungs 2 (two) times a  day. Started by: Jerri Glauser Charmian Muff, MD   montelukast 10 MG tablet Commonly known as: SINGULAIR Take 1 tablet (10 mg total) by mouth at bedtime.   omeprazole 40 MG capsule Commonly known as: PRILOSEC Take 1 capsule (40 mg total) by mouth daily.   potassium chloride SA 20 MEQ tablet Commonly known as: K-DUR Take 1 tablet (20 mEq total) by mouth daily.   ProAir HFA 108 (90 Base) MCG/ACT inhaler Generic drug: albuterol Inhale 2 puffs into the lungs every 6 (six) hours as needed for wheezing or shortness of breath.   albuterol (2.5 MG/3ML) 0.083% nebulizer solution Commonly known as: PROVENTIL Take 3 mLs (2.5 mg total) by nebulization every 6 (six) hours as needed for wheezing or shortness of breath.       Known medication allergies: Allergies  Allergen Reactions  . Grass Extracts [Gramineae Pollens]     Nasal congestion, post nasal drip   . Shellfish Allergy Itching    In throat only.     Physical examination: Blood pressure 122/88, pulse 95, temperature (!) 97.2 F (36.2 C), temperature source Temporal, resp. rate 16, height 5\' 2"  (1.575 m), weight (!) 317 lb 3.2 oz (143.9 kg), SpO2 97 %.  General: Alert, interactive, in no acute distress, obese. HEENT: PERRLA, TMs pearly gray, turbinates moderately edematous with clear discharge L>R, post-pharynx non erythematous. Neck: Supple without lymphadenopathy. Lungs: Mildly  decreased breath sounds bilaterally without wheezing, rhonchi or rales. {no increased work of breathing. CV: Normal S1, S2 without murmurs. Abdomen: Nondistended, nontender. Skin: Warm and dry, without lesions or rashes. Extremities:  No clubbing, cyanosis or edema. Neuro:   Grossly intact.  Diagnositics/Labs:  Spirometry: FEV1: 1.57L 67%, FVC: 1.92L 68% predicted  Post-BD she did not have significant improvement.     Assessment and plan:   Asthma, mod persistent  - not well controlled  - stop Flovent  - start Dulera 224mcg 2 puffs twice a day with spacer  - continue Singulair 10mg  daily at bedtime  - have access to Xopenex inhaler 2 puffs every 4-6 hours as needed for cough/wheeze/shortness of breath/chest tightness.  May use 15-20 minutes prior to activity.   Monitor frequency of use.    Asthma control goals:   Full participation in all desired activities (may need albuterol before activity)  Albuterol use two time or less a week on average (not counting use with activity)  Cough interfering with sleep two time or less a month  Oral steroids no more than once a year  No hospitalizations  Allergies   - post-nasal drip predominant symptom  - recommend use of nasal antihistamine, Astelin 2 sprays each nostril twice a day to help manage post-nasal drip  - can continue use of Flonase 2 sprays each nostril daily as needed if having any nasal congestion  - continue Zyrtec 10mg  daily  - will obtain environmental allergy panel  Reflux  - continue use of omeprazole  - continue to avoid eating 1-2 hours before bedtime  Adverse food reaction   - continue avoidance of shellfish  - have access to self-injectable epinephrine (Epipen or AuviQ) 0.3mg  at all times  - follow emergency action plan in case of allergic reaction  - will obtain shellfish IgE levels  Follow-up 3 months or sooner if needed  I appreciate the opportunity to take part in Alexxis's care. Please do not hesitate  to contact me with questions.  Sincerely,   Prudy Feeler, MD Allergy/Immunology [facility]

## 2018-10-20 NOTE — Telephone Encounter (Signed)
Pending

## 2018-10-22 LAB — ALLERGENS W/TOTAL IGE AREA 2
Alternaria Alternata IgE: <0.1 kU/L
Aspergillus Fumigatus IgE: <0.1 kU/L
Bermuda Grass IgE: <0.1 kU/L
Cat Dander IgE: 1.06 kU/L — AB
Cedar, Mountain IgE: 0.16 kU/L — AB
Cladosporium Herbarum IgE: <0.1 kU/L
Cockroach, German IgE: 3.55 kU/L — AB
Common Silver Birch IgE: <0.1 kU/L
Cottonwood IgE: <0.1 kU/L
D Farinae IgE: 2.7 kU/L — AB
D Pteronyssinus IgE: 1.36 kU/L — AB
Dog Dander IgE: <0.1 kU/L
Elm, American IgE: <0.1 kU/L
IgE (Immunoglobulin E), Serum: 80 IU/mL (ref 6–495)
Johnson Grass IgE: <0.1 kU/L
Maple/Box Elder IgE: <0.1 kU/L
Mouse Urine IgE: <0.1 kU/L
Oak, White IgE: <0.1 kU/L
Pecan, Hickory IgE: <0.1 kU/L
Penicillium Chrysogen IgE: <0.1 kU/L
Pigweed, Rough IgE: <0.1 kU/L
Ragweed, Short IgE: 0.55 kU/L — AB
Sheep Sorrel IgE Qn: <0.1 kU/L
Timothy Grass IgE: <0.1 kU/L
White Mulberry IgE: <0.1 kU/L

## 2018-10-22 LAB — ALLERGEN PROFILE, SHELLFISH
Clam IgE: 0.49 kU/L — AB
F023-IgE Crab: 4.74 kU/L — AB
F080-IgE Lobster: 4.98 kU/L — AB
F290-IgE Oyster: 0.33 kU/L — AB
Scallop IgE: 0.92 kU/L — AB
Shrimp IgE: 6.28 kU/L — AB

## 2018-10-23 NOTE — Telephone Encounter (Signed)
Medication does not meet the definition of medical necessity found in the member's benefit booklet. It was denied Dr Nelva Bush please advise

## 2018-10-24 NOTE — Telephone Encounter (Signed)
Received PA approval for levalbuterol tartate

## 2018-10-24 NOTE — Telephone Encounter (Signed)
Called [employer] did a peer to peer with Kellie Shropshire pharmacist and gave tachycardia,jitterness, and increased anxiety with albuterol use. They updated for approval and it was back dated to 10/19/2018 approval will be good for 3 yrs

## 2018-10-24 NOTE — Telephone Encounter (Signed)
Great - thanks

## 2018-10-24 NOTE — Telephone Encounter (Signed)
What is the definition of medical necessity?   Pt has tachycardia, jitteriness and increased anxiety with albuterol use.   Can you send my note with this documented?

## 2018-10-24 NOTE — Telephone Encounter (Signed)
Called pharmacy to make them aware of approval

## 2018-10-25 ENCOUNTER — Telehealth: Payer: Self-pay | Admitting: *Deleted

## 2018-10-25 NOTE — Telephone Encounter (Signed)
LVMTCB to change office visit to virtual visit.

## 2018-10-27 MED ORDER — EPINEPHRINE 0.3 MG/0.3ML IJ SOAJ: 0.3000 mg | Freq: Once | INTRAMUSCULAR | 2 refills | Status: AC

## 2018-10-27 NOTE — Addendum Note (Signed)
Addended by: Neomia Dear on: 10/27/2018 04:03 PM   Modules accepted: Orders

## 2018-10-31 ENCOUNTER — Telehealth: Payer: Self-pay

## 2018-10-31 NOTE — Telephone Encounter (Signed)
Left a voice mail letting patient know her appointment will be virtual and to not come into the office.

## 2018-11-01 ENCOUNTER — Telehealth: Payer: Self-pay

## 2018-11-01 ENCOUNTER — Telehealth: Payer: BC Managed Care – PPO | Admitting: Internal Medicine

## 2018-11-01 NOTE — Telephone Encounter (Signed)
Called pt to start virtual appointment and nurse was able to go over medications and vitals. However, MD sent link to proceed with appointment with no response. Nurse attempted to contact pt x 2. Left final message requesting pt called back to reschedule appointment.

## 2018-11-07 ENCOUNTER — Telehealth: Payer: Self-pay

## 2018-11-07 ENCOUNTER — Ambulatory Visit: Payer: BC Managed Care – PPO | Admitting: Family Medicine

## 2018-11-07 NOTE — Telephone Encounter (Signed)
We received a fax from [facility] [place] They have been trying to contact you about your Samantha Romero Epipen They are trying to coordinate delivery with you and verify your address Please call them back [phone]  Call to patient to remind her to call, left message.

## 2018-11-08 ENCOUNTER — Ambulatory Visit: Payer: BC Managed Care – PPO | Admitting: Internal Medicine

## 2018-11-14 ENCOUNTER — Telehealth (INDEPENDENT_AMBULATORY_CARE_PROVIDER_SITE_OTHER): Payer: BC Managed Care – PPO | Admitting: Family Medicine

## 2018-11-14 ENCOUNTER — Other Ambulatory Visit: Payer: Self-pay

## 2018-11-14 DIAGNOSIS — F418 Other specified anxiety disorders: Secondary | ICD-10-CM | POA: Diagnosis not present

## 2018-11-14 DIAGNOSIS — I10 Essential (primary) hypertension: Secondary | ICD-10-CM | POA: Diagnosis not present

## 2018-11-14 DIAGNOSIS — I5021 Acute systolic (congestive) heart failure: Secondary | ICD-10-CM

## 2018-11-14 MED ORDER — POTASSIUM CHLORIDE CRYS ER 20 MEQ PO TBCR: 20.0000 meq | EXTENDED_RELEASE_TABLET | Freq: Every day | ORAL | 1 refills | Status: DC

## 2018-11-14 MED ORDER — CARVEDILOL 6.25 MG PO TABS: 6.2500 mg | ORAL_TABLET | Freq: Two times a day (BID) | ORAL | 1 refills | Status: DC

## 2018-11-14 MED ORDER — ALPRAZOLAM 1 MG PO TABS: 1.0000 mg | ORAL_TABLET | Freq: Three times a day (TID) | ORAL | 2 refills | Status: DC | PRN

## 2018-11-14 MED ORDER — BUPROPION HCL ER (XL) 300 MG PO TB24: 300.0000 mg | ORAL_TABLET | Freq: Every day | ORAL | 1 refills | Status: DC

## 2018-11-14 MED ORDER — FUROSEMIDE 40 MG PO TABS: 40.0000 mg | ORAL_TABLET | Freq: Every day | ORAL | 1 refills | Status: DC

## 2018-11-14 MED ORDER — DULOXETINE HCL 60 MG PO CPEP: 60.0000 mg | ORAL_CAPSULE | Freq: Every day | ORAL | 0 refills | Status: DC

## 2018-11-14 MED ORDER — LOSARTAN POTASSIUM 25 MG PO TABS: 25.0000 mg | ORAL_TABLET | Freq: Every day | ORAL | 1 refills | Status: DC

## 2018-11-14 NOTE — Progress Notes (Signed)
Pt is f/u on htn, hyperlipidemia, anxiety and depression. She feels that the meds that she is on rightnow are working very well. Sh is needing refills on the pended medication. Pharmacy has been verified. She was referred a cardiologist. She is asking that a referral is submitted for cards, gad7 and phq9 completed. gad7 score 16.

## 2018-11-14 NOTE — Progress Notes (Signed)
Virtual Visit Note  I connected with patient on 11/14/18 at 529pm by phone and verified that I am speaking with the correct person using two identifiers. Samantha Romero is currently located at car and patient is currently with them during visit. The provider, Myles LippsIrma M Santiago, MD is located in their office at time of visit.  I discussed the limitations, risks, security and privacy concerns of performing an evaluation and management service by telephone and the availability of in person appointments. I also discussed with the patient that there may be a patient responsible charge related to this service. The patient expressed understanding and agreed to proceed.   CC: followup  HPI ? Patient is a 43 y.o. female with past medical history significant for HTN with CHF, asthma, GERD, depression, anxiety, gastric lap band, fibromyalgia, who presents today for routine followup  Had issues with connecting with virtual visit with cardiology She would like referral to card Dr Katrinka BlazingSmith, whom treats several of her family members  BP at home 130-140s Working on diet and LFM Takes all meds as prescribed  Lab Results  Component Value Date   CREATININE 1.04 (H) 10/10/2018   BUN 12 10/10/2018   NA 142 10/10/2018   K 4.1 10/10/2018   CL 101 10/10/2018   CO2 21 10/10/2018    Depression and anxiety are well controlled Does not take xanax daily pmp reviewed  Allergies  Allergen Reactions  . Grass Extracts [Gramineae Pollens]     Nasal congestion, post nasal drip   . Shellfish Allergy Itching    In throat only.    Prior to Admission medications   Medication Sig Start Date End Date Taking? Authorizing Provider  albuterol (PROAIR HFA) 108 (90 Base) MCG/ACT inhaler Inhale 2 puffs into the lungs every 6 (six) hours as needed for wheezing or shortness of breath.     [provider]  albuterol (PROVENTIL) (2.5 MG/3ML) 0.083% nebulizer solution Take 3 mLs (2.5 mg total) by nebulization every  6 (six) hours as needed for wheezing or shortness of breath. 08/23/18   Myles LippsSantiago, Irma M, MD  ALPRAZolam Prudy Feeler(XANAX) 1 MG tablet Take 1 tablet (1 mg total) by mouth 3 (three) times daily as needed for anxiety. 10/06/18   Myles LippsSantiago, Irma M, MD  amLODipine (NORVASC) 5 MG tablet Take 1 tablet (5 mg total) by mouth daily. 08/14/18   Myles LippsSantiago, Irma M, MD  azelastine (ASTELIN) 0.1 % nasal spray Place 2 sprays into both nostrils 2 (two) times daily. 10/19/18   Marcelyn BruinsPadgett, Shaylar Patricia, MD  buPROPion (WELLBUTRIN XL) 300 MG 24 hr tablet Take 1 tablet (300 mg total) by mouth daily. Needs office visit prior to next refill 10/05/18   Myles LippsSantiago, Irma M, MD  carvedilol (COREG) 6.25 MG tablet Take 1 tablet (6.25 mg total) by mouth 2 (two) times daily with a meal. 10/07/18   Johnson, Clanford L, MD  cetirizine (ZYRTEC) 10 MG tablet Take 10 mg by mouth daily with breakfast.    [provider]  DULoxetine (CYMBALTA) 60 MG capsule TAKE 1 CAPSULE (60 MG TOTAL) BY MOUTH DAILY. NEEDS OFFICE VISIT PRIOR TO NEXT REFILL 09/28/18   Myles LippsSantiago, Irma M, MD  fluticasone [facility](FLONASE) 50 MCG/ACT nasal spray Place 1 spray into both nostrils every morning.    [provider]  fluticasone (FLOVENT HFA) 110 MCG/ACT inhaler Inhale 2 puffs into the lungs 2 (two) times daily. 10/04/18   Corum, Minerva FesterLisa L, MD  furosemide (LASIX) 40 MG tablet Take 1 tablet (40  mg total) by mouth daily. 10/07/18   Cleora Fleet, MD  levalbuterol [facility] HFA) 45 MCG/ACT inhaler Inhale 2 puffs into the lungs every 6 (six) hours as needed for wheezing. 10/19/18   Marcelyn Bruins, MD  levalbuterol Pauline Aus) 1.25 MG/3ML nebulizer solution Take 1.25 mg by nebulization every 6 (six) hours as needed for wheezing. 10/19/18   Marcelyn Bruins, MD  losartan (COZAAR) 25 MG tablet Take 1 tablet (25 mg total) by mouth daily. 10/07/18   Johnson, Clanford L, MD  mometasone-formoterol (DULERA) 200-5 MCG/ACT AERO Inhale 2 puffs into the lungs 2 (two) times a  day. 10/19/18   Marcelyn Bruins, MD  montelukast (SINGULAIR) 10 MG tablet Take 1 tablet (10 mg total) by mouth at bedtime. 08/14/18   Myles Lipps, MD  omeprazole (PRILOSEC) 40 MG capsule Take 1 capsule (40 mg total) by mouth daily. 10/04/18   Corum, Minerva Fester, MD  potassium chloride SA (K-DUR) 20 MEQ tablet Take 1 tablet (20 mEq total) by mouth daily. 10/07/18   Cleora Fleet, MD    Past Medical History:  Diagnosis Date  . Anxiety    panic attacks  . Asthma   . CHF (congestive heart failure), NYHA class II, acute, systolic (HCC) 10/06/2018  . Depression   . Fibromyalgia   . Hypertension    no meds currently  . No pertinent past medical history   . Pneumonia    hx of   . Sinus problem     Past Surgical History:  Procedure Laterality Date  . ESOPHAGOGASTRODUODENOSCOPY N/A 11/26/2014   Procedure: ESOPHAGOGASTRODUODENOSCOPY (EGD);  Surgeon: Ovidio Kin, MD;  Location: [facility];  Service: General;  Laterality: N/A;  . GASTRIC BANDING PORT REVISION  08/24/11  . LAPAROSCOPIC GASTRIC BANDING  12/26/2007  . LAPAROSCOPIC LYSIS OF ADHESIONS N/A 01/06/2015   Procedure: LAPAROSCOPIC LYSIS OF ADHESIONS;  Surgeon: Luretha Murphy, MD;  Location: [facility];  Service: General;  Laterality: N/A;  . LAPAROSCOPIC REPAIR AND REMOVAL OF GASTRIC BAND N/A 01/06/2015   Procedure: LAPAROSCOPIC RESIGHTING OF GASTRIC Band PORT;  Surgeon: Luretha Murphy, MD;  Location: [facility];  Service: General;  Laterality: N/A;  . NO PAST SURGERIES      Social History   Tobacco Use  . Smoking status: Never Smoker  . Smokeless tobacco: Never Used  Substance Use Topics  . Alcohol use: Yes    Comment: socially    Family History  Problem Relation Age of Onset  . Cancer Maternal Uncle        lung  . Cancer Maternal Grandfather        lung and back    Review of Systems  Constitutional: Negative for chills and fever.  Respiratory: Negative for cough and shortness of breath.   Cardiovascular: Negative  for chest pain, palpitations, orthopnea, leg swelling and PND.  Gastrointestinal: Negative for abdominal pain, nausea and vomiting.    Objective  Vitals as reported by the patient: per above   ASSESSMENT and PLAN  1. CHF (congestive heart failure), NYHA class II, acute, systolic (HCC) Doing well. Cont current regime. Referred to cards per preference.  - Ambulatory referral to Cardiology - carvedilol (COREG) 6.25 MG tablet; Take 1 tablet (6.25 mg total) by mouth 2 (two) times daily with a meal. - furosemide (LASIX) 40 MG tablet; Take 1 tablet (40 mg total) by mouth daily. - potassium chloride SA (K-DUR) 20 MEQ tablet; Take 1 tablet (20 mEq total) by mouth daily. - losartan (COZAAR) 25  MG tablet; Take 1 tablet (25 mg total) by mouth daily.  2. Essential hypertension Controlled. Continue current regime.  - carvedilol (COREG) 6.25 MG tablet; Take 1 tablet (6.25 mg total) by mouth 2 (two) times daily with a meal. - losartan (COZAAR) 25 MG tablet; Take 1 tablet (25 mg total) by mouth daily.  3. Depression with anxiety Controlled. Continue current regime.  - buPROPion (WELLBUTRIN XL) 300 MG 24 hr tablet; Take 1 tablet (300 mg total) by mouth daily. - DULoxetine (CYMBALTA) 60 MG capsule; Take 1 capsule (60 mg total) by mouth daily. Needs office visit prior to next refill  Other orders - ALPRAZolam (XANAX) 1 MG tablet; Take 1 tablet (1 mg total) by mouth 3 (three) times daily as needed for anxiety.   FOLLOW-UP: 3 months   The above assessment and management plan was discussed with the patient. The patient verbalized understanding of and has agreed to the management plan. Patient is aware to call the clinic if symptoms persist or worsen. Patient is aware when to return to the clinic for a follow-up visit. Patient educated on when it is appropriate to go to the emergency department.    I provided 12 minutes of non-face-to-face time during this encounter.  Rutherford Guys, MD Primary  Care at [facility] [place], [place] [address] Ph.  [phone] Fax [phone]

## 2018-11-16 ENCOUNTER — Telehealth: Payer: Self-pay | Admitting: Family Medicine

## 2018-11-16 NOTE — Progress Notes (Signed)
LVM to schedule appt

## 2018-11-16 NOTE — Telephone Encounter (Signed)
LVM to schedule 3 month f/u

## 2019-01-25 ENCOUNTER — Ambulatory Visit: Payer: BC Managed Care – PPO | Admitting: Allergy

## 2019-02-08 ENCOUNTER — Other Ambulatory Visit: Payer: Self-pay | Admitting: Family Medicine

## 2019-02-11 NOTE — Progress Notes (Signed)
Cardiology Office Note:    Date:  02/12/2019   ID:  Samantha Romero, DOB 02/27/1976, MRN [id number]  PCP:  Samantha LippsSantiago, Irma M, MD  Cardiologist:  No primary care provider on file.   Referring MD: Samantha LippsSantiago, Irma M, MD   Chief Complaint  Patient presents with  . Congestive Heart Failure    History of Present Illness:    Samantha Romero is a 43 y.o. female with a hx of systolic heart failure, obesity, bariatric surgery, and asthma referred for cardiology consultation by Dr. Leretha PolSantiago.  [name] is a pleasant female who is the Chief Operating Officercampaign director for [employer] and also has an administrative role in the [employer].  She was admitted to the hospital in July with extreme blood pressure elevation and the diagnosis of congestive heart failure was made.  She admits to a history of hypertension but poor compliance with therapy.  At the time of admission she was having shortness of breath on exertion, sweating, and some lower extremity swelling.  She was started on therapy for blood pressure and diuresed.  This led to improvement.  She was last seen by cardiologist in the hospital.  She was asked to follow-up with a cardiologist and by word of mouth referral from [employer]ella RegisterK[employer] Manning and Mrs. Pearline CablesRalph Mitchell she has waited to see me.  She has not experienced chest pain.  She has not experienced palpitations.  She denies claudication.  She has not had orthopnea or PND.Marland Kitchen.  Past Medical History:  Diagnosis Date  . Anxiety    panic attacks  . Asthma   . CHF (congestive heart failure), NYHA class II, acute, systolic (HCC) 10/06/2018  . Depression   . Fibromyalgia   . Hypertension    no meds currently  . No pertinent past medical history   . Pneumonia    hx of   . Sinus problem     Past Surgical History:  Procedure Laterality Date  . ESOPHAGOGASTRODUODENOSCOPY N/A 11/26/2014   Procedure: ESOPHAGOGASTRODUODENOSCOPY (EGD);  Surgeon: Ovidio Kinavid Newman, MD;  [facility];  Service: General;   Laterality: N/A;  . GASTRIC BANDING PORT REVISION  08/24/11  . LAPAROSCOPIC GASTRIC BANDING  12/26/2007  . LAPAROSCOPIC LYSIS OF ADHESIONS N/A 01/06/2015   Procedure: LAPAROSCOPIC LYSIS OF ADHESIONS;  Surgeon: Luretha MurphyMatthew Martin, MD;  Location: [facility];  Service: General;  Laterality: N/A;  . LAPAROSCOPIC REPAIR AND REMOVAL OF GASTRIC BAND N/A 01/06/2015   Procedure: LAPAROSCOPIC RESIGHTING OF GASTRIC Band PORT;  Surgeon: Luretha MurphyMatthew Martin, MD;  Location: [facility];  Service: General;  Laterality: N/A;  . NO PAST SURGERIES      Current Medications: Current Meds  Medication Sig  . albuterol (PROAIR HFA) 108 (90 Base) MCG/ACT inhaler Inhale 2 puffs into the lungs every 6 (six) hours as needed for wheezing or shortness of breath.   Marland Kitchen. albuterol (PROVENTIL) (2.5 MG/3ML) 0.083% nebulizer solution Take 3 mLs (2.5 mg total) by nebulization every 6 (six) hours as needed for wheezing or shortness of breath.  . ALPRAZolam (XANAX) 1 MG tablet Take 1 tablet (1 mg total) by mouth 3 (three) times daily as needed for anxiety.  Marland Kitchen. azelastine (ASTELIN) 0.1 % nasal spray Place 2 sprays into both nostrils 2 (two) times daily.  Marland Kitchen. buPROPion (WELLBUTRIN XL) 300 MG 24 hr tablet Take 1 tablet (300 mg total) by mouth daily.  . carvedilol (COREG) 6.25 MG tablet Take 1 tablet (6.25 mg total) by mouth 2 (two) times daily with a meal.  .  cetirizine (ZYRTEC) 10 MG tablet Take 10 mg by mouth daily with breakfast.  . DULoxetine (CYMBALTA) 60 MG capsule Take 1 capsule (60 mg total) by mouth daily. Needs office visit prior to next refill  . fluticasone (FLONASE) 50 MCG/ACT nasal spray Place 1 spray into both nostrils every morning.  . furosemide (LASIX) 40 MG tablet Take 1 tablet (40 mg total) by mouth daily.  Marland Kitchen levalbuterol (XOPENEX HFA) 45 MCG/ACT inhaler Inhale 2 puffs into the lungs every 6 (six) hours as needed for wheezing.  . levalbuterol (XOPENEX) 1.25 MG/3ML nebulizer solution Take 1.25 mg by nebulization every 6 (six) hours as  needed for wheezing.  Marland Kitchen losartan (COZAAR) 25 MG tablet Take 1 tablet (25 mg total) by mouth daily.  . mometasone-formoterol (DULERA) 200-5 MCG/ACT AERO Inhale 2 puffs into the lungs 2 (two) times a day.  . montelukast (SINGULAIR) 10 MG tablet TAKE 1 TABLET BY MOUTH EVERYDAY AT BEDTIME  . omeprazole (PRILOSEC) 40 MG capsule Take 1 capsule (40 mg total) by mouth daily.  . potassium chloride SA (K-DUR) 20 MEQ tablet Take 1 tablet (20 mEq total) by mouth daily.  . [DISCONTINUED] amLODipine (NORVASC) 5 MG tablet Take 1 tablet (5 mg total) by mouth daily.     Allergies:   Grass extracts [gramineae pollens] and Shellfish allergy   Social History   Socioeconomic History  . Marital status: Single    Spouse name: Not on file  . Number of children: Not on file  . Years of education: Not on file  . Highest education level: Not on file  Occupational History  . Not on file  Social Needs  . Financial resource strain: Not on file  . Food insecurity    Worry: Not on file    Inability: Not on file  . Transportation needs    Medical: Not on file    Non-medical: Not on file  Tobacco Use  . Smoking status: Never Smoker  . Smokeless tobacco: Never Used  Substance and Sexual Activity  . Alcohol use: Yes    Comment: socially  . Drug use: Never  . Sexual activity: Not Currently    Birth control/protection: I.U.D.    Comment: Mirena  Lifestyle  . Physical activity    Days per week: Not on file    Minutes per session: Not on file  . Stress: Not on file  Relationships  . Social Musician on phone: Not on file    Gets together: Not on file    Attends religious service: Not on file    Active member of club or organization: Not on file    Attends meetings of clubs or organizations: Not on file    Relationship status: Not on file  Other Topics Concern  . Not on file  Social History Narrative  . Not on file     Family History: The patient's family history includes Cancer in her  maternal grandfather and maternal uncle.  ROS:   Please see the history of present illness.    She snores.  She sweats.  She has had prior bariatric surgery.  She is not known to be diabetic.  She has a history of asthma since her youth.  All other systems reviewed and are negative.  EKGs/Labs/Other Studies Reviewed:    The following studies were reviewed today:  2D Doppler echocardiogram October 06, 2018:  1. The left ventricle has moderately reduced systolic function, with an ejection fraction of 35-40%. The cavity  size was moderately dilated. There is moderately increased left ventricular wall thickness. Indeterminate diastolic filling due to E-A  fusion.  2. Hypokinesis of the inferior, inferoseptal walls of the LV.  3. Trivial pericardial effusion is present.  4. The mitral valve is abnormal. Mild thickening of the mitral valve leaflet. There is mild mitral annular calcification present. Mitral valve regurgitation is moderate by color flow Doppler. The MR jet is posteriorly-directed.  5. The aortic valve is tricuspid.  6. The interatrial septum was not assessed.  EKG:  EKG October 09, 2018, sinus tachycardia with poor R wave progression V1 through V3.  Mild interventricular conduction delay.  Recent Labs: 10/05/2018: B Natriuretic Peptide 86.2; TSH 0.578 10/10/2018: BUN 12; Creatinine, Ser 1.04; Hemoglobin 13.2; Platelets 268; Potassium 4.1; Sodium 142  Recent Lipid Panel No results found for: CHOL, TRIG, HDL, CHOLHDL, VLDL, LDLCALC, LDLDIRECT  Physical Exam:    VS:  BP 134/88   Pulse (!) 104   Ht 5\' 2"  (1.575 Romero)   Wt (!) 317 lb 12.8 oz (144.2 kg)   SpO2 96%   BMI 58.13 kg/Romero     Wt Readings from Last 3 Encounters:  02/12/19 (!) 317 lb 12.8 oz (144.2 kg)  10/19/18 (!) 317 lb 3.2 oz (143.9 kg)  10/10/18 (!) 317 lb (143.8 kg)     GEN: Morbidly obese.. No acute distress HEENT: Normal NECK: No JVD. LYMPHATICS: No lymphadenopathy CARDIAC:  RRR without murmur, gallop, or edema.  VASCULAR:  Normal Pulses. No bruits. RESPIRATORY:  Clear to auscultation without rales, wheezing or rhonchi  ABDOMEN: Soft, non-tender, non-distended, No pulsatile mass, MUSCULOSKELETAL: No deformity  SKIN: Warm and dry NEUROLOGIC:  Alert and oriented x 3 PSYCHIATRIC:  Normal affect   ASSESSMENT:    1. CHF (congestive heart failure), NYHA class II, acute, systolic (HCC)   2. Essential hypertension   3. Class 3 severe obesity due to excess calories without serious comorbidity with body mass index (BMI) of 45.0 to 49.9 in adult (HCC)   4. Snoring   5. Educated about COVID-19 virus infection    PLAN:    In order of problems listed above:  1. Chronic combined systolic and diastolic heart failure with documented LVEF 35 to 40% in July.  Will adjust medical regimen to include guideline directed therapy for systolic dysfunction.  Therefore amlodipine will be discontinued.  Entresto 24/26 mg twice daily will be started.  Clinical follow-up in 2 to 3 weeks.  Basic metabolic panel at that time. 2. Blood pressure target 130/80 mmHg.  Based upon review of records during the hospital stay in July, I am suspicious that hypertension is the etiology of decreased LV function. 3. She has had bariatric surgery. 4. May need to have a sleep study at some point. 5. 3W's is endorsed and use in her lifestyle.  Guideline directed therapy for left ventricular systolic dysfunction: Angiotensin receptor-neprilysin inhibitor (ARNI)-Entresto; beta-blocker therapy - carvedilol or metoprolol succinate; mineralocorticoid receptor antagonist (MRA) therapy -spironolactone or eplerenone.  These therapies have been shown to improve clinical outcomes including reduction of rehospitalization survival, and acute heart failure.    Medication Adjustments/Labs and Tests Ordered: Current medicines are reviewed at length with the patient today.  Concerns regarding medicines are outlined above.  Orders Placed This Encounter   Procedures  . ECHOCARDIOGRAM COMPLETE   Meds ordered this encounter  Medications  . sacubitril-valsartan (ENTRESTO) 24-26 MG    Sig: Take 1 tablet by mouth 2 (two) times daily.    Dispense:  60 tablet    Refill:  11    D/Romero Amlodipine    Patient Instructions  Medication Instructions:  1) DISCONTINUE Amlodipine 2) START Entresto 24/26mg  twice daily  *If you need a refill on your cardiac medications before your next appointment, please call your pharmacy*  Lab Work: None If you have labs (blood work) drawn today and your tests are completely normal, you will receive your results only by: Marland Kitchen MyChart Message (if you have MyChart) OR . A paper copy in the mail If you have any lab test that is abnormal or we need to change your treatment, we will call you to review the results.  Testing/Procedures: Your physician has requested that you have an echocardiogram in LATE December. Echocardiography is a painless test that uses sound waves to create images of your heart. It provides your doctor with information about the size and shape of your heart and how well your heart's chambers and valves are working. This procedure takes approximately one hour. There are no restrictions for this procedure.    Follow-Up: At [facility], you and your health needs are our priority.  As part of our continuing mission to provide you with exceptional heart care, we have created designated Provider Care Teams.  These Care Teams include your primary Cardiologist (physician) and Advanced Practice Providers (APPs -  Physician Assistants and Nurse Practitioners) who all work together to provide you with the care you need, when you need it.  Your next appointment:   2-3 weeks (Can have 12/2 at 4:20P or 12/4 at 3:40P)  The format for your next appointment:   In Person  Provider:   You may see Dr. Daneen Schick or one of the following Advanced Practice Providers on your designated Care Team:    Truitt Merle,  NP  Cecilie Kicks, NP  Kathyrn Drown, NP   Other Instructions      Signed, Sinclair Grooms, MD  02/12/2019 [facility]

## 2019-02-12 ENCOUNTER — Other Ambulatory Visit: Payer: Self-pay

## 2019-02-12 ENCOUNTER — Encounter: Payer: Self-pay | Admitting: Interventional Cardiology

## 2019-02-12 ENCOUNTER — Ambulatory Visit: Payer: BC Managed Care – PPO | Admitting: Interventional Cardiology

## 2019-02-12 ENCOUNTER — Encounter (INDEPENDENT_AMBULATORY_CARE_PROVIDER_SITE_OTHER): Payer: Self-pay

## 2019-02-12 VITALS — BP 134/88 | HR 104 | Ht 62.0 in | Wt 317.8 lb

## 2019-02-12 DIAGNOSIS — R0683 Snoring: Secondary | ICD-10-CM | POA: Diagnosis not present

## 2019-02-12 DIAGNOSIS — I5021 Acute systolic (congestive) heart failure: Secondary | ICD-10-CM

## 2019-02-12 DIAGNOSIS — I110 Hypertensive heart disease with heart failure: Secondary | ICD-10-CM

## 2019-02-12 DIAGNOSIS — Z6841 Body Mass Index (BMI) 40.0 and over, adult: Secondary | ICD-10-CM

## 2019-02-12 DIAGNOSIS — I10 Essential (primary) hypertension: Secondary | ICD-10-CM

## 2019-02-12 DIAGNOSIS — Z7189 Other specified counseling: Secondary | ICD-10-CM

## 2019-02-12 MED ORDER — SACUBITRIL-VALSARTAN 24-26 MG PO TABS: 1.0000 | ORAL_TABLET | Freq: Two times a day (BID) | ORAL | 11 refills | Status: DC

## 2019-02-12 NOTE — Patient Instructions (Addendum)
Medication Instructions:  1) DISCONTINUE Amlodipine 2) START Entresto 24/26mg  twice daily  *If you need a refill on your cardiac medications before your next appointment, please call your pharmacy*  Lab Work: None If you have labs (blood work) drawn today and your tests are completely normal, you will receive your results only by: Marland Kitchen MyChart Message (if you have MyChart) OR . A paper copy in the mail If you have any lab test that is abnormal or we need to change your treatment, we will call you to review the results.  Testing/Procedures: Your physician has requested that you have an echocardiogram in LATE December. Echocardiography is a painless test that uses sound waves to create images of your heart. It provides your doctor with information about the size and shape of your heart and how well your heart's chambers and valves are working. This procedure takes approximately one hour. There are no restrictions for this procedure.    Follow-Up: At [facility], you and your health needs are our priority.  As part of our continuing mission to provide you with exceptional heart care, we have created designated Provider Care Teams.  These Care Teams include your primary Cardiologist (physician) and Advanced Practice Providers (APPs -  Physician Assistants and Nurse Practitioners) who all work together to provide you with the care you need, when you need it.  Your next appointment:   2-3 weeks (Can have 12/2 at 4:20P or 12/4 at 3:40P)  The format for your next appointment:   In Person  Provider:   You may see Dr. Daneen Schick or one of the following Advanced Practice Providers on your designated Care Team:    Truitt Merle, NP  Cecilie Kicks, NP  Kathyrn Drown, NP   Other Instructions

## 2019-02-23 ENCOUNTER — Other Ambulatory Visit: Payer: Self-pay | Admitting: Family Medicine

## 2019-02-23 NOTE — Telephone Encounter (Signed)
Requested medication (s) are due for refill today: yes  Requested medication (s) are on the active medication list: yes  Last refill:  01/23/2019  Future visit scheduled: no  Notes to clinic:  Refill cannot be delegated    Requested Prescriptions  Pending Prescriptions Disp Refills   ALPRAZolam (XANAX) 1 MG tablet [Pharmacy Med Name: ALPRAZOLAM 1 MG TABLET] 40 tablet 2    Sig: Take 1 tablet (1 mg total) by mouth 3 (three) times daily as needed for anxiety.     Not Delegated - Psychiatry:  Anxiolytics/Hypnotics Failed - 02/23/2019  1:07 PM      Failed - This refill cannot be delegated      Failed - Urine Drug Screen completed in last 360 days.      Passed - Valid encounter within last 6 months    Recent Outpatient Visits          3 months ago CHF (congestive heart failure), NYHA class II, acute, systolic ([place])   [facility], Lilia Argue, MD   4 months ago CHF (congestive heart failure), NYHA class II, acute, systolic [facility])   [facility], Lilia Argue, MD   4 months ago Snoring   [facility] at [facility], Rex Kras, MD   6 months ago Fibromyalgia   [facility], Lilia Argue, MD   2 years ago [facility] [place], Gay Filler, MD      Future Appointments            In 5 days Belva Crome, MD [facility]

## 2019-02-23 NOTE — Telephone Encounter (Signed)
Please Advise  Patient is requesting a refill of the following medications: Requested Prescriptions   Pending Prescriptions Disp Refills  . ALPRAZolam (XANAX) 1 MG tablet [Pharmacy Med Name: ALPRAZOLAM 1 MG TABLET] 40 tablet 2    Sig: TAKE 1 TABLET (1 MG TOTAL) BY MOUTH 3 (THREE) TIMES DAILY AS NEEDED FOR ANXIETY    Date of patient request: 02/23/19 Last office visit: 11/14/18 Date of last refill: 11/14/18 Last refill amount: 40 tab, 2 refills Follow up time period per chart: n/a

## 2019-02-26 NOTE — Progress Notes (Deleted)
Cardiology Office Note:    Date:  02/26/2019   ID:  Samantha Romero, DOB April 23, 1975, MRN [id number]  PCP:  Myles Lipps, MD  Cardiologist:  No primary care provider on file.   Referring MD: Myles Lipps, MD   No chief complaint on file.   History of Present Illness:    Samantha Romero is a 43 y.o. female with a hx of systolic heart failure, obesity, bariatric surgery, and asthma.  Sherryll Burger was recommended but could not be afforded. Losatan started instead.  ***  Past Medical History:  Diagnosis Date   Anxiety    panic attacks   Asthma    CHF (congestive heart failure), NYHA class II, acute, systolic (HCC) 10/06/2018   Depression    Fibromyalgia    Hypertension    no meds currently   No pertinent past medical history    Pneumonia    hx of    Sinus problem     Past Surgical History:  Procedure Laterality Date   ESOPHAGOGASTRODUODENOSCOPY N/A 11/26/2014   Procedure: ESOPHAGOGASTRODUODENOSCOPY (EGD);  Surgeon: Ovidio Kin, MD;  [facility];  Service: General;  Laterality: N/A;   GASTRIC BANDING PORT REVISION  08/24/11   LAPAROSCOPIC GASTRIC BANDING  12/26/2007   LAPAROSCOPIC LYSIS OF ADHESIONS N/A 01/06/2015   Procedure: LAPAROSCOPIC LYSIS OF ADHESIONS;  Surgeon: Luretha Murphy, MD;  Location: [facility];  Service: General;  Laterality: N/A;   LAPAROSCOPIC REPAIR AND REMOVAL OF GASTRIC BAND N/A 01/06/2015   Procedure: LAPAROSCOPIC RESIGHTING OF GASTRIC Band PORT;  Surgeon: Luretha Murphy, MD;  Location: [facility];  Service: General;  Laterality: N/A;   NO PAST SURGERIES      Current Medications: No outpatient medications have been marked as taking for the 02/28/19 encounter (Appointment) with Lyn Records, MD.     Allergies:   Grass extracts [gramineae pollens] and Shellfish allergy   Social History   Socioeconomic History   Marital status: Single    Spouse name: Not on file   Number of children: Not on file   Years of education:  Not on file   Highest education level: Not on file  Occupational History   Not on file  Social Needs   Financial resource strain: Not on file   Food insecurity    Worry: Not on file    Inability: Not on file   Transportation needs    Medical: Not on file    Non-medical: Not on file  Tobacco Use   Smoking status: Never Smoker   Smokeless tobacco: Never Used  Substance and Sexual Activity   Alcohol use: Yes    Comment: socially   Drug use: Never   Sexual activity: Not Currently    Birth control/protection: I.U.D.    Comment: Mirena  Lifestyle   Physical activity    Days per week: Not on file    Minutes per session: Not on file   Stress: Not on file  Relationships   Social connections    Talks on phone: Not on file    Gets together: Not on file    Attends religious service: Not on file    Active member of club or organization: Not on file    Attends meetings of clubs or organizations: Not on file    Relationship status: Not on file  Other Topics Concern   Not on file  Social History Narrative   Not on file     Family History: The patient's family history includes  Cancer in her maternal grandfather and maternal uncle.  ROS:   Please see the history of present illness.    *** All other systems reviewed and are negative.  EKGs/Labs/Other Studies Reviewed:    The following studies were reviewed today:   ECHOCARDIOGRAM 2020:  IMPRESSIONS  1. The left ventricle has moderately reduced systolic function, with an ejection fraction of 35-40%. The cavity size was moderately dilated. There is moderately increased left ventricular wall thickness. Indeterminate diastolic filling due to E-A  fusion.  2. Hypokinesis of the inferior, inferoseptal walls of the LV.  3. Trivial pericardial effusion is present.  4. The mitral valve is abnormal. Mild thickening of the mitral valve leaflet. There is mild mitral annular calcification present. Mitral valve regurgitation  is moderate by color flow Doppler. The MR jet is posteriorly-directed.  5. The aortic valve is tricuspid.  6. The interatrial septum was not assessed.  EKG:  EKG ***  Recent Labs: 10/05/2018: B Natriuretic Peptide 86.2; TSH 0.578 10/10/2018: BUN 12; Creatinine, Ser 1.04; Hemoglobin 13.2; Platelets 268; Potassium 4.1; Sodium 142  Recent Lipid Panel No results found for: CHOL, TRIG, HDL, CHOLHDL, VLDL, LDLCALC, LDLDIRECT  Physical Exam:    VS:  There were no vitals taken for this visit.    Wt Readings from Last 3 Encounters:  02/12/19 (!) 317 lb 12.8 oz (144.2 kg)  10/19/18 (!) 317 lb 3.2 oz (143.9 kg)  10/10/18 (!) 317 lb (143.8 kg)     GEN: ***. No acute distress HEENT: Normal NECK: No JVD. LYMPHATICS: No lymphadenopathy CARDIAC: *** RRR without murmur, gallop, or edema. VASCULAR: *** Normal Pulses. No bruits. RESPIRATORY:  Clear to auscultation without rales, wheezing or rhonchi  ABDOMEN: Soft, non-tender, non-distended, No pulsatile mass, MUSCULOSKELETAL: No deformity  SKIN: Warm and dry NEUROLOGIC:  Alert and oriented x 3 PSYCHIATRIC:  Normal affect   ASSESSMENT:    1. CHF (congestive heart failure), NYHA class II, acute, systolic ([place])   2. Essential hypertension   3. Class 3 severe obesity due to excess calories without serious comorbidity with body mass index (BMI) of 45.0 to 49.9 in adult ([place])   4. Snoring   5. Educated about COVID-19 virus infection    PLAN:    In order of problems listed above:  1. ***   Medication Adjustments/Labs and Tests Ordered: Current medicines are reviewed at length with the patient today.  Concerns regarding medicines are outlined above.  No orders of the defined types were placed in this encounter.  No orders of the defined types were placed in this encounter.   There are no Patient Instructions on file for this visit.   Signed, Sinclair Grooms, MD  02/26/2019 1:00 PM    [place]

## 2019-02-26 NOTE — Telephone Encounter (Signed)
pmp reviewd, appropriate meds refilled 

## 2019-02-28 ENCOUNTER — Ambulatory Visit: Payer: BC Managed Care – PPO | Admitting: Interventional Cardiology

## 2019-02-28 ENCOUNTER — Other Ambulatory Visit: Payer: BC Managed Care – PPO

## 2019-02-28 ENCOUNTER — Telehealth: Payer: Self-pay | Admitting: Interventional Cardiology

## 2019-02-28 DIAGNOSIS — I5021 Acute systolic (congestive) heart failure: Secondary | ICD-10-CM

## 2019-02-28 NOTE — Telephone Encounter (Signed)
Spoke with pt and she said pharmacy flagged her for Losartan and Entresto.  Pt has not started the Entresto.  She planned to speak to [place] today at her appt but she had to cancel because she has a fever. Advised pt to stop Losartan and start her Entresto.  Scheduled pt for labs on 12/21 but advised if fever persists to contact the office as this would need to be rescheduled.  Per Dr. Tamala Julian, need to move echo back a few weeks since Entresto wasn't started.  Scheduled pt to have echo 04/15/2018 and to see Dr. Tamala Julian for f/u 04/22/2018.  Pt verbalized understanding and was in agreement with this plan.

## 2019-02-28 NOTE — Telephone Encounter (Signed)
New message:       Patients calling stating the pharmacy flagged one of her medication. Please call patient to discuss this matter.

## 2019-03-07 ENCOUNTER — Other Ambulatory Visit: Payer: Self-pay | Admitting: Family Medicine

## 2019-03-07 DIAGNOSIS — F418 Other specified anxiety disorders: Secondary | ICD-10-CM

## 2019-03-08 ENCOUNTER — Other Ambulatory Visit: Payer: Self-pay | Admitting: Family Medicine

## 2019-03-19 ENCOUNTER — Other Ambulatory Visit: Payer: BC Managed Care – PPO

## 2019-03-26 ENCOUNTER — Other Ambulatory Visit (HOSPITAL_COMMUNITY): Payer: BC Managed Care – PPO

## 2019-04-10 ENCOUNTER — Other Ambulatory Visit: Payer: Self-pay

## 2019-04-10 ENCOUNTER — Telehealth (INDEPENDENT_AMBULATORY_CARE_PROVIDER_SITE_OTHER): Payer: BC Managed Care – PPO | Admitting: Family Medicine

## 2019-04-10 DIAGNOSIS — K219 Gastro-esophageal reflux disease without esophagitis: Secondary | ICD-10-CM

## 2019-04-10 DIAGNOSIS — F418 Other specified anxiety disorders: Secondary | ICD-10-CM

## 2019-04-10 DIAGNOSIS — I5021 Acute systolic (congestive) heart failure: Secondary | ICD-10-CM

## 2019-04-10 MED ORDER — POTASSIUM CHLORIDE CRYS ER 20 MEQ PO TBCR: 20.0000 meq | EXTENDED_RELEASE_TABLET | Freq: Every day | ORAL | 1 refills | Status: DC

## 2019-04-10 MED ORDER — DULOXETINE HCL 60 MG PO CPEP: 60.0000 mg | ORAL_CAPSULE | Freq: Every day | ORAL | 1 refills | Status: DC

## 2019-04-10 MED ORDER — ALPRAZOLAM 1 MG PO TABS: 1.0000 mg | ORAL_TABLET | Freq: Three times a day (TID) | ORAL | 2 refills | Status: DC | PRN

## 2019-04-10 MED ORDER — OMEPRAZOLE 40 MG PO CPDR: DELAYED_RELEASE_CAPSULE | ORAL | 1 refills | Status: DC

## 2019-04-10 MED ORDER — BUPROPION HCL ER (XL) 450 MG PO TB24: 450.0000 mg | ORAL_TABLET | Freq: Every day | ORAL | 1 refills | Status: DC

## 2019-04-10 MED ORDER — FUROSEMIDE 40 MG PO TABS: 40.0000 mg | ORAL_TABLET | Freq: Every day | ORAL | 1 refills | Status: DC

## 2019-04-10 NOTE — Progress Notes (Signed)
Pt is needing a refill on pended medication. Went to cards for heart condition. Says she is having some pain from fibromyalgia, want to revisit taking tizanidine. Also wants to know info on when she will be able to get the covid vaccine injection due to her med condition

## 2019-04-10 NOTE — Patient Instructions (Signed)
For therapy -- [facility], [phone] [facility], [phone] [facility] - [phone] [facility] - [phone] [facility], [phone] [facility]  - [phone] (do not file insurance) [facility] - [phone]  Nutritionist Megan [facility] Raynelle Fanning [facility]

## 2019-04-10 NOTE — Progress Notes (Signed)
Virtual Visit Note  I connected with patient on 04/10/19 at 540pm by phone and verified that I am speaking with the correct person using two identifiers. Samantha Romero is currently located at car and patient is currently with them during visit. The provider, Myles Lipps, MD is located in their office at time of visit.  I discussed the limitations, risks, security and privacy concerns of performing an evaluation and management service by telephone and the availability of in person appointments. I also discussed with the patient that there may be a patient responsible charge related to this service. The patient expressed understanding and agreed to proceed.   CC: routine followup  HPI ? Patient is a43 y.o.femalewith past medical history significant for HTN with CHF, asthma, GERD, depression, anxiety, gastric lap band, fibromyalgia, who presents today for routine followup  Last OV Sept 2020 - referred to cards Saw cards, Dr Katrinka Blazing, in Nov 2020 - stopped amlodipine and losartan, started entresto, plan for repeat echo Possible sleep study Patient has been tolerating entresto well Reports BP has been doing well, SBP 130 per patient She has appt with cards later this month  Depression and anxiety - depression has not been so feel controlled since covid, she has been working from home for almost a year Takes wellbutrin and cymbalta Would like to increase wellbutrin Continues to takes xanax prn pmp reviewed  GERD - well controlled on current omeprazole  Asthma - well controlled on her current She sees asthma and allergy specialist  Started a new job end of 2020  Allergies  Allergen Reactions  . Grass Extracts [Gramineae Pollens]     Nasal congestion, post nasal drip   . Shellfish Allergy Itching    In throat only.    Prior to Admission medications   Medication Sig Start Date End Date Taking? Authorizing Provider  albuterol (PROAIR HFA) 108 (90 Base) MCG/ACT inhaler Inhale  2 puffs into the lungs every 6 (six) hours as needed for wheezing or shortness of breath.    Yes [provider]  albuterol (PROVENTIL) (2.5 MG/3ML) 0.083% nebulizer solution Take 3 mLs (2.5 mg total) by nebulization every 6 (six) hours as needed for wheezing or shortness of breath. 08/23/18  Yes Myles Lipps, MD  ALPRAZolam (XANAX) 1 MG tablet TAKE 1 TABLET (1 MG TOTAL) BY MOUTH 3 (THREE) TIMES DAILY AS NEEDED FOR ANXIETY 02/26/19  Yes Myles Lipps, MD  azelastine (ASTELIN) 0.1 % nasal spray Place 2 sprays into both nostrils 2 (two) times daily. 10/19/18  Yes Marcelyn Bruins, MD  buPROPion (WELLBUTRIN XL) 300 MG 24 hr tablet Take 1 tablet (300 mg total) by mouth daily. 11/14/18  Yes Myles Lipps, MD  carvedilol (COREG) 6.25 MG tablet Take 1 tablet (6.25 mg total) by mouth 2 (two) times daily with a meal. 11/14/18  Yes Myles Lipps, MD  cetirizine (ZYRTEC) 10 MG tablet Take 10 mg by mouth daily with breakfast.   Yes [provider]  DULoxetine (CYMBALTA) 60 MG capsule TAKE 1 CAPSULE (60 MG TOTAL) BY MOUTH DAILY. NEEDS OFFICE VISIT PRIOR TO NEXT REFILL 03/07/19  Yes Myles Lipps, MD  fluticasone [facility]) 50 MCG/ACT nasal spray Place 1 spray into both nostrils every morning.   Yes [provider]  furosemide (LASIX) 40 MG tablet Take 1 tablet (40 mg total) by mouth daily. 11/14/18  Yes Myles Lipps, MD  levalbuterol [facility] HFA) 45 MCG/ACT inhaler Inhale 2 puffs into the lungs  every 6 (six) hours as needed for wheezing. 10/19/18  Yes Padgett, Rae Halsted, MD  levalbuterol [facility]) 1.25 MG/3ML nebulizer solution Take 1.25 mg by nebulization every 6 (six) hours as needed for wheezing. 10/19/18  Yes Padgett, Rae Halsted, MD  mometasone-formoterol [facility]) 200-5 MCG/ACT AERO Inhale 2 puffs into the lungs 2 (two) times a day. 10/19/18  Yes Padgett, Rae Halsted, MD  montelukast (SINGULAIR) 10 MG tablet TAKE 1 TABLET BY MOUTH EVERYDAY AT  BEDTIME 02/08/19  Yes Rutherford Guys, MD  omeprazole (PRILOSEC) 40 MG capsule TAKE 1 CAPSULE BY MOUTH EVERY DAY 03/08/19  Yes Rutherford Guys, MD  potassium chloride SA (K-DUR) 20 MEQ tablet Take 1 tablet (20 mEq total) by mouth daily. 11/14/18  Yes Rutherford Guys, MD  sacubitril-valsartan (ENTRESTO) 24-26 MG Take 1 tablet by mouth 2 (two) times daily. 02/12/19  Yes Belva Crome, MD    Past Medical History:  Diagnosis Date  . Anxiety    panic attacks  . Asthma   . CHF (congestive heart failure), NYHA class II, acute, systolic ([place]) 9/89/2119  . Depression   . Fibromyalgia   . Hypertension    no meds currently  . No pertinent past medical history   . Pneumonia    hx of   . Sinus problem     Past Surgical History:  Procedure Laterality Date  . ESOPHAGOGASTRODUODENOSCOPY N/A 11/26/2014   Procedure: ESOPHAGOGASTRODUODENOSCOPY (EGD);  Surgeon: Alphonsa Overall, MD;  Location: [facility];  Service: General;  Laterality: N/A;  . GASTRIC BANDING PORT REVISION  08/24/11  . LAPAROSCOPIC GASTRIC BANDING  12/26/2007  . LAPAROSCOPIC LYSIS OF ADHESIONS N/A 01/06/2015   Procedure: LAPAROSCOPIC LYSIS OF ADHESIONS;  Surgeon: Johnathan Hausen, MD;  Location: [facility];  Service: General;  Laterality: N/A;  . LAPAROSCOPIC REPAIR AND REMOVAL OF GASTRIC BAND N/A 01/06/2015   Procedure: LAPAROSCOPIC RESIGHTING OF GASTRIC Band PORT;  Surgeon: Johnathan Hausen, MD;  Location: [facility];  Service: General;  Laterality: N/A;  . NO PAST SURGERIES      Social History   Tobacco Use  . Smoking status: Never Smoker  . Smokeless tobacco: Never Used  Substance Use Topics  . Alcohol use: Yes    Comment: socially    Family History  Problem Relation Age of Onset  . Cancer Maternal Uncle        lung  . Cancer Maternal Grandfather        lung and back    Review of Systems  Constitutional: Negative for chills and fever.  Respiratory: Negative for cough and shortness of breath.   Cardiovascular: Negative for  chest pain, palpitations and leg swelling.  Gastrointestinal: Negative for abdominal pain, nausea and vomiting.   Per hpi  Objective  Vitals as reported by the patient: per above Gen: AAOx3, NAD Speaking in full sentences, breathing comfortably   ASSESSMENT and PLAN  1. Depression with anxiety Not controlled. Increasing wellbutrin. Discussed counseling. Provided resources.  - DULoxetine (CYMBALTA) 60 MG capsule; Take 1 capsule (60 mg total) by mouth daily. - buPROPion 450 MG TB24; Take 450 mg by mouth daily.  2. Gastroesophageal reflux disease without esophagitis Stable. Cont current regime  3. CHF (congestive heart failure), NYHA class II, acute, systolic ([place]) Managed by cards. - potassium chloride SA (KLOR-CON) 20 MEQ tablet; Take 1 tablet (20 mEq total) by mouth daily. - furosemide (LASIX) 40 MG tablet; Take 1 tablet (40 mg total) by mouth daily.  Other orders - ALPRAZolam (XANAX) 1 MG  tablet; Take 1 tablet (1 mg total) by mouth 3 (three) times daily as needed for anxiety. - omeprazole (PRILOSEC) 40 MG capsule; TAKE 1 CAPSULE BY MOUTH EVERY DAY  FOLLOW-UP: 3 months   The above assessment and management plan was discussed with the patient. The patient verbalized understanding of and has agreed to the management plan. Patient is aware to call the clinic if symptoms persist or worsen. Patient is aware when to return to the clinic for a follow-up visit. Patient educated on when it is appropriate to go to the emergency department.    I provided 13 minutes of non-face-to-face time during this encounter.  Myles Lipps, MD Primary Care at [facility] [address] [place], [place] [address] Ph.  [phone] Fax [phone]

## 2019-04-13 ENCOUNTER — Telehealth: Payer: Self-pay | Admitting: Interventional Cardiology

## 2019-04-13 NOTE — Telephone Encounter (Signed)
Pt c/o medication issue:  1. Name of Medication: sacubitril-valsartan (ENTRESTO) 24-26 MG  2. How are you currently taking this medication (dosage and times per day)? 1 tablet by mouth 2 (two) times daily  3. Are you having a reaction (difficulty breathing--STAT)? no  4. What is your medication issue? Patient is calling stating that her insurance company is requiring prior authorization within 24 hours to verify medication.

## 2019-04-13 NOTE — Telephone Encounter (Signed)
**Note De-Identified Emilo Gras Obfuscation** I called [facility] to get the pts INS info: [employer] RX Plus  ID: [id number] BIN: [id number] PCN: [id number] GRP: [id number]  Could not do Entresto PA through covermymeds.so I called [employer] at [phone] and did the PA over the phone with a Pharmacist, Milas Gain. Per Milas Gain this [place] PA has been approved until 03/28/2020. Reference #: [id number]  I have notified the pt and [facility] pharmacy of this approval.

## 2019-04-16 ENCOUNTER — Other Ambulatory Visit: Payer: Self-pay

## 2019-04-16 ENCOUNTER — Ambulatory Visit (HOSPITAL_COMMUNITY): Payer: 59 | Attending: Cardiovascular Disease

## 2019-04-16 DIAGNOSIS — I5021 Acute systolic (congestive) heart failure: Secondary | ICD-10-CM | POA: Diagnosis present

## 2019-04-22 NOTE — Progress Notes (Signed)
Cardiology Office Note:    Date:  04/23/2019   ID:  Samantha Romero, DOB 23-Aug-1975, MRN [id number]  PCP:  Rutherford Guys, MD  Cardiologist:  Sinclair Grooms, MD   Referring MD: Rutherford Guys, MD   Chief Complaint  Patient presents with  . Congestive Heart Failure  . Advice Only    Snoring/excessive daytime sleepiness    History of Present Illness:    Samantha Romero is a 44 y.o. female with a hx of systolic heart failure( 3/82 EF 40% --> 1/21 EF 30%), obesity, bariatric surgery, and asthma. On guideline directed therapy initiated in process of being titrated.  [name] feels much better than she did in the summer.  She can now go up and down stairs without dyspnea, orthopnea, or significant dyspnea on exertion.  She denies chest pain.  She snores and has excessive daytime sleepiness.  No family history of heart disease.  She denies personal history of diabetes and smoking.  Has significant anxiety and stress related to her job and other issues.  The COVID-19 pandemic is not helping.  Past Medical History:  Diagnosis Date  . Anxiety    panic attacks  . Asthma   . CHF (congestive heart failure), NYHA class II, acute, systolic ([place]) 08/01/3974  . Depression   . Fibromyalgia   . Hypertension    no meds currently  . No pertinent past medical history   . Pneumonia    hx of   . Sinus problem     Past Surgical History:  Procedure Laterality Date  . ESOPHAGOGASTRODUODENOSCOPY N/A 11/26/2014   Procedure: ESOPHAGOGASTRODUODENOSCOPY (EGD);  Surgeon: Alphonsa Overall, MD;  Location: [facility];  Service: General;  Laterality: N/A;  . GASTRIC BANDING PORT REVISION  08/24/11  . LAPAROSCOPIC GASTRIC BANDING  12/26/2007  . LAPAROSCOPIC LYSIS OF ADHESIONS N/A 01/06/2015   Procedure: LAPAROSCOPIC LYSIS OF ADHESIONS;  Surgeon: Johnathan Hausen, MD;  Location: [facility];  Service: General;  Laterality: N/A;  . LAPAROSCOPIC REPAIR AND REMOVAL OF GASTRIC BAND N/A 01/06/2015   Procedure:  LAPAROSCOPIC RESIGHTING OF GASTRIC Band PORT;  Surgeon: Johnathan Hausen, MD;  Location: [facility];  Service: General;  Laterality: N/A;  . NO PAST SURGERIES      Current Medications: Current Meds  Medication Sig  . albuterol (PROAIR HFA) 108 (90 Base) MCG/ACT inhaler Inhale 2 puffs into the lungs every 6 (six) hours as needed for wheezing or shortness of breath.   Marland Kitchen albuterol (PROVENTIL) (2.5 MG/3ML) 0.083% nebulizer solution Take 3 mLs (2.5 mg total) by nebulization every 6 (six) hours as needed for wheezing or shortness of breath.  . ALPRAZolam (XANAX) 1 MG tablet Take 1 tablet (1 mg total) by mouth 3 (three) times daily as needed for anxiety.  Marland Kitchen azelastine (ASTELIN) 0.1 % nasal spray Place 2 sprays into both nostrils 2 (two) times daily.  Marland Kitchen buPROPion 450 MG TB24 Take 450 mg by mouth daily.  . cetirizine (ZYRTEC) 10 MG tablet Take 10 mg by mouth daily with breakfast.  . DULoxetine (CYMBALTA) 60 MG capsule Take 1 capsule (60 mg total) by mouth daily.  . fluticasone (FLONASE) 50 MCG/ACT nasal spray Place 1 spray into both nostrils every morning.  . furosemide (LASIX) 40 MG tablet Take 1 tablet (40 mg total) by mouth daily.  Marland Kitchen levalbuterol (XOPENEX HFA) 45 MCG/ACT inhaler Inhale 2 puffs into the lungs every 6 (six) hours as needed for wheezing.  . levalbuterol (XOPENEX) 1.25 MG/3ML nebulizer solution Take 1.25  mg by nebulization every 6 (six) hours as needed for wheezing.  . mometasone-formoterol (DULERA) 200-5 MCG/ACT AERO Inhale 2 puffs into the lungs 2 (two) times a day.  . montelukast (SINGULAIR) 10 MG tablet TAKE 1 TABLET BY MOUTH EVERYDAY AT BEDTIME  . omeprazole (PRILOSEC) 40 MG capsule TAKE 1 CAPSULE BY MOUTH EVERY DAY  . potassium chloride SA (KLOR-CON) 20 MEQ tablet Take 1 tablet (20 mEq total) by mouth every Monday, Wednesday, and Friday.  . sacubitril-valsartan (ENTRESTO) 24-26 MG Take 1 tablet by mouth 2 (two) times daily.  . [DISCONTINUED] carvedilol (COREG) 6.25 MG tablet Take 1  tablet (6.25 mg total) by mouth 2 (two) times daily with a meal.  . [DISCONTINUED] potassium chloride SA (KLOR-CON) 20 MEQ tablet Take 1 tablet (20 mEq total) by mouth daily.     Allergies:   Grass extracts [gramineae pollens] and Shellfish allergy   Social History   Socioeconomic History  . Marital status: Single    Spouse name: Not on file  . Number of children: Not on file  . Years of education: Not on file  . Highest education level: Not on file  Occupational History  . Not on file  Tobacco Use  . Smoking status: Never Smoker  . Smokeless tobacco: Never Used  Substance and Sexual Activity  . Alcohol use: Yes    Comment: socially  . Drug use: Never  . Sexual activity: Not Currently    Birth control/protection: I.U.D.    Comment: Mirena  Other Topics Concern  . Not on file  Social History Narrative  . Not on file   Social Determinants of Health   Financial Resource Strain:   . Difficulty of Paying Living Expenses: Not on file  Food Insecurity:   . Worried About Programme researcher, broadcasting/film/video in the Last Year: Not on file  . Ran Out of Food in the Last Year: Not on file  Transportation Needs:   . Lack of Transportation (Medical): Not on file  . Lack of Transportation (Non-Medical): Not on file  Physical Activity:   . Days of Exercise per Week: Not on file  . Minutes of Exercise per Session: Not on file  Stress:   . Feeling of Stress : Not on file  Social Connections:   . Frequency of Communication with Friends and Family: Not on file  . Frequency of Social Gatherings with Friends and Family: Not on file  . Attends Religious Services: Not on file  . Active Member of Clubs or Organizations: Not on file  . Attends Banker Meetings: Not on file  . Marital Status: Not on file     Family History: The patient's family history includes Cancer in her maternal grandfather and maternal uncle.  ROS:   Please see the history of present illness.    Very emotional.   Tearful as we discussed results of her recent echocardiogram.  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  All other systems reviewed and are negative.  EKGs/Labs/Other Studies Reviewed:    The following studies were reviewed today: 2D doppler ECHOCARDIOGRAM  04/16/2019 IMPRESSIONS    1. Left ventricular ejection fraction, by visual estimation, is 25 to 30%. The left ventricle has severely decreased function. There is no left ventricular hypertrophy.  2. Severely dilated left ventricular internal cavity size.  3. The left ventricle demonstrates global hypokinesis.  4. Abnormal GLS -10.1 Diffuse hypokinesis worse in septum.  5.  Global right ventricle has normal systolic function.The right ventricular size is normal. No increase in right ventricular wall thickness.  6. Left atrial size was mildly dilated.  7. Right atrial size was normal.  8. Mild mitral annular calcification.  9. The mitral valve is normal in structure. Mild mitral valve regurgitation. 10. The tricuspid valve is normal in structure. 11. The tricuspid valve is normal in structure. Tricuspid valve regurgitation is mild. 12. The aortic valve is tricuspid. Aortic valve regurgitation is not visualized. Mild aortic valve sclerosis without stenosis. 13. Pulmonic regurgitation is mild. 14. The pulmonic valve was grossly normal. Pulmonic valve regurgitation is mild. 15. The interatrial septum was not well visualized.  EKG:  EKG not performed  Recent Labs: 10/05/2018: B Natriuretic Peptide 86.2; TSH 0.578 10/10/2018: BUN 12; Creatinine, Ser 1.04; Hemoglobin 13.2; Platelets 268; Potassium 4.1; Sodium 142  Recent Lipid Panel No results found for: CHOL, TRIG, HDL, CHOLHDL, VLDL, LDLCALC, LDLDIRECT  Physical Exam:    VS:  BP 130/86   Pulse (!) 110   Ht 5\' 2"  (1.575 m)   Wt (!) 321 lb 12.8 oz (146 kg)   SpO2 97%   BMI 58.86 kg/m     Wt Readings from Last 3  Encounters:  04/23/19 (!) 321 lb 12.8 oz (146 kg)  02/12/19 (!) 317 lb 12.8 oz (144.2 kg)  10/19/18 (!) 317 lb 3.2 oz (143.9 kg)     GEN: Morbid obesity. No acute distress HEENT: Normal NECK: No JVD. LYMPHATICS: No lymphadenopathy CARDIAC:  RRR without murmur, gallop, or edema. VASCULAR:  Normal Pulses. No bruits. RESPIRATORY:  Clear to auscultation without rales, wheezing or rhonchi  ABDOMEN: Soft, non-tender, non-distended, No pulsatile mass, MUSCULOSKELETAL: No deformity  SKIN: Warm and dry NEUROLOGIC:  Alert and oriented x 3 PSYCHIATRIC:  Normal affect   ASSESSMENT:    1. CHF (congestive heart failure), NYHA class II, acute, systolic (H[facility])   2. Class 3 severe obesity due to excess calories without serious comorbidity with body mass index (BMI) of 45.0 to 49.9 in adult (HCC)   3. Snoring   4. Essential hypertension   5. Educated about COVID-19 virus infection    PLAN:    In order of problems listed above:  1. Uptitrate heart failure therapy first by increasing carvedilol to 12.5 mg twice daily x2 weeks.  Add Aldactone 12.5 mg to the current regimen today.  Decrease potassium to 10 mEq/day.  Basic metabolic panel in 1 week.  In 2 weeks if tolerating carvedilol up titration, increase Entresto to 49/51 mg p.o. twice daily with follow-up blood work 7 to 10 days thereafter.  Can be seen by a team member for this up titration.  Future further additions will include the addition of an SGLT2, and possibly BiDil.  She will be scheduled to have a sleep study to rule out sleep apnea.  Also discussed necessity for coronary angiography and right heart cath once on stable medical regimen. 2. Not discussed other than encouraging the incorporation of exercise and increasing plant-based components in her diet. 3. Sleep study 4. Target blood pressure less than 130/80 mmHg 5. COVID-19 vaccine when available is recommended.  3W's advocated to avoid COVID-19 infection.  Medication Adjustments/Labs  and Tests Ordered: Current medicines are reviewed at length with the patient today.  Concerns regarding medicines are outlined above.  Orders Placed This Encounter  Procedures  . Basic metabolic panel  . Basic metabolic panel  . Split night study   Meds ordered this encounter  Medications  . carvedilol (COREG) 12.5 MG tablet    Sig: Take 1 tablet (12.5 mg total) by mouth 2 (two) times daily.    Dispense:  180 tablet    Refill:  3    Dose change  . spironolactone (ALDACTONE) 25 MG tablet    Sig: Take 0.5 tablets (12.5 mg total) by mouth daily.    Dispense:  45 tablet    Refill:  3  . potassium chloride SA (KLOR-CON) 20 MEQ tablet    Sig: Take 1 tablet (20 mEq total) by mouth every Monday, Wednesday, and Friday.    Dispense:  45 tablet    Refill:  1    Dose change    Patient Instructions  Medication Instructions:  1) INCREASE Carvedilol to 12.5mg  twice daily 2) START Spironolactone 12.5mg  once daily 3) DECREASE Potassium to one tablet on Monday, Wednesday and Friday  *If you need a refill on your cardiac medications before your next appointment, please call your pharmacy*  Lab Work: BMET today  Your physician recommends that you return for lab work in: 1 week (BMET)  If you have labs (blood work) drawn today and your tests are completely normal, you will receive your results only by: Marland Kitchen MyChart Message (if you have MyChart) OR . A paper copy in the mail If you have any lab test that is abnormal or we need to change your treatment, we will call you to review the results.  Testing/Procedures: Your physician has recommended that you have a sleep study. This test records several body functions during sleep, including: brain activity, eye movement, oxygen and carbon dioxide blood levels, heart rate and rhythm, breathing rate and rhythm, the flow of air through your mouth and nose, snoring, body muscle movements, and chest and belly movement.    Follow-Up:  Your physician  recommends that you schedule a follow-up appointment in: 2 weeks with a PA or NP.     At [facility], you and your health needs are our priority.  As part of our continuing mission to provide you with exceptional heart care, we have created designated Provider Care Teams.  These Care Teams include your primary Cardiologist (physician) and Advanced Practice Providers (APPs -  Physician Assistants and Nurse Practitioners) who all work together to provide you with the care you need, when you need it.  Your next appointment:   2-3 month(s)  The format for your next appointment:   In Person  Provider:   You may see Lesleigh Noe, MD or one of the following Advanced Practice Providers on your designated Care Team:    Norma Fredrickson, NP  Nada Boozer, NP  Georgie Chard, NP   Other Instructions      Signed, Lesleigh Noe, MD  04/23/2019 5:15 PM    [facility]

## 2019-04-23 ENCOUNTER — Other Ambulatory Visit: Payer: Self-pay

## 2019-04-23 ENCOUNTER — Encounter: Payer: Self-pay | Admitting: Interventional Cardiology

## 2019-04-23 ENCOUNTER — Ambulatory Visit (INDEPENDENT_AMBULATORY_CARE_PROVIDER_SITE_OTHER): Payer: 59 | Admitting: Interventional Cardiology

## 2019-04-23 VITALS — BP 130/86 | HR 110 | Ht 62.0 in | Wt 321.8 lb

## 2019-04-23 DIAGNOSIS — R0683 Snoring: Secondary | ICD-10-CM

## 2019-04-23 DIAGNOSIS — E66813 Obesity, class 3: Secondary | ICD-10-CM

## 2019-04-23 DIAGNOSIS — I10 Essential (primary) hypertension: Secondary | ICD-10-CM

## 2019-04-23 DIAGNOSIS — I5021 Acute systolic (congestive) heart failure: Secondary | ICD-10-CM | POA: Diagnosis not present

## 2019-04-23 DIAGNOSIS — Z7189 Other specified counseling: Secondary | ICD-10-CM

## 2019-04-23 DIAGNOSIS — Z6841 Body Mass Index (BMI) 40.0 and over, adult: Secondary | ICD-10-CM

## 2019-04-23 MED ORDER — POTASSIUM CHLORIDE CRYS ER 20 MEQ PO TBCR: 20.0000 meq | EXTENDED_RELEASE_TABLET | ORAL | 1 refills | Status: DC

## 2019-04-23 MED ORDER — CARVEDILOL 12.5 MG PO TABS: 12.5000 mg | ORAL_TABLET | Freq: Two times a day (BID) | ORAL | 3 refills | Status: DC

## 2019-04-23 MED ORDER — SPIRONOLACTONE 25 MG PO TABS: 12.5000 mg | ORAL_TABLET | Freq: Every day | ORAL | 3 refills | Status: DC

## 2019-04-23 NOTE — Patient Instructions (Addendum)
Medication Instructions:  1) INCREASE Carvedilol to 12.5mg  twice daily 2) START Spironolactone 12.5mg  once daily 3) DECREASE Potassium to one tablet on Monday, Wednesday and Friday  *If you need a refill on your cardiac medications before your next appointment, please call your pharmacy*  Lab Work: BMET today  Your physician recommends that you return for lab work in: 1 week (BMET)  If you have labs (blood work) drawn today and your tests are completely normal, you will receive your results only by: Marland Kitchen MyChart Message (if you have MyChart) OR . A paper copy in the mail If you have any lab test that is abnormal or we need to change your treatment, we will call you to review the results.  Testing/Procedures: Your physician has recommended that you have a sleep study. This test records several body functions during sleep, including: brain activity, eye movement, oxygen and carbon dioxide blood levels, heart rate and rhythm, breathing rate and rhythm, the flow of air through your mouth and nose, snoring, body muscle movements, and chest and belly movement.    Follow-Up:  Your physician recommends that you schedule a follow-up appointment in: 2 weeks with a PA or NP.     At [facility], you and your health needs are our priority.  As part of our continuing mission to provide you with exceptional heart care, we have created designated Provider Care Teams.  These Care Teams include your primary Cardiologist (physician) and Advanced Practice Providers (APPs -  Physician Assistants and Nurse Practitioners) who all work together to provide you with the care you need, when you need it.  Your next appointment:   2-3 month(s)  The format for your next appointment:   In Person  Provider:   You may see Lesleigh Noe, MD or one of the following Advanced Practice Providers on your designated Care Team:    Norma Fredrickson, NP  Nada Boozer, NP  Georgie Chard, NP   Other  Instructions

## 2019-04-24 LAB — BASIC METABOLIC PANEL
BUN/Creatinine Ratio: 10 (ref 9–23)
BUN: 10 mg/dL (ref 6–24)
CO2: 24 mmol/L (ref 20–29)
Calcium: 9.3 mg/dL (ref 8.7–10.2)
Chloride: 106 mmol/L (ref 96–106)
Creatinine, Ser: 0.98 mg/dL (ref 0.57–1.00)
GFR calc Af Amer: 82 mL/min/{1.73_m2} (ref >59)
GFR calc non Af Amer: 71 mL/min/{1.73_m2} (ref >59)
Glucose: 102 mg/dL — ABNORMAL HIGH (ref 65–99)
Potassium: 4.3 mmol/L (ref 3.5–5.2)
Sodium: 143 mmol/L (ref 134–144)

## 2019-05-01 ENCOUNTER — Other Ambulatory Visit: Payer: 59

## 2019-05-02 NOTE — Progress Notes (Signed)
CARDIOLOGY OFFICE NOTE  Date:  05/07/2019    Samantha Romero Date of Birth: 13-Apr-1975 Medical Record #[id number]  PCP:  Myles Lipps, MD  Cardiologist:  Katrinka Blazing   Chief Complaint  Patient presents with  . Follow-up    Seen for Dr. Katrinka Blazing    History of Present Illness: Samantha Romero is a 44 y.o. female who presents today for a follow up visit. Seen for Dr. Katrinka Blazing.   She has a history of chronic systolic HF (EF 40% in 09/2018 -->30% 03/2019), obesity, bariatric surgery and asthma.   Seen just a few weeks ago by Dr. Katrinka Blazing - titrating medical therapy due to reduction in EF. Clinically had improved. Probably has OSA. Lots of anxiety/stress with job and other issues noted - worsened with the pandemic.   The patient does not have symptoms concerning for COVID-19 infection (fever, chills, cough, or new shortness of breath).   Comes in today. Here alone. She notes that she was doing ok since her last visit here until this past weekend. She felt more congested - "rattling" - does think she got too much salt - she likes chips. She ended up taking an extra dose of her Lasix with good diuresis/response. Feels much better today. No real swelling noted - just congested and more short of breath. She does have scales - does not like to weigh as a general rule due to her obesity. She admits she could do better with salt restriction. She says she is taking her medicines. She got mixed up on the Aldactone - actually already taking 25 mg a day - needs lab today. Remains on potassium. No chest pain. Not dizzy or lightheaded. She does get winded rather easily. Still with lots of stress - she is taking a "90 day sabbatical" from her job.    Past Medical History:  Diagnosis Date  . Anxiety    panic attacks  . Asthma   . CHF (congestive heart failure), NYHA class II, acute, systolic (H[facility]) 10/06/2018  . Depression   . Fibromyalgia   . Hypertension    no meds currently  . No pertinent past medical  history   . Pneumonia    hx of   . Sinus problem     Past Surgical History:  Procedure Laterality Date  . ESOPHAGOGASTRODUODENOSCOPY N/A 11/26/2014   Procedure: ESOPHAGOGASTRODUODENOSCOPY (EGD);  Surgeon: Ovidio Kin, MD;  Location: [facility];  Service: General;  Laterality: N/A;  . GASTRIC BANDING PORT REVISION  08/24/11  . LAPAROSCOPIC GASTRIC BANDING  12/26/2007  . LAPAROSCOPIC LYSIS OF ADHESIONS N/A 01/06/2015   Procedure: LAPAROSCOPIC LYSIS OF ADHESIONS;  Surgeon: Luretha Murphy, MD;  Location: [facility];  Service: General;  Laterality: N/A;  . LAPAROSCOPIC REPAIR AND REMOVAL OF GASTRIC BAND N/A 01/06/2015   Procedure: LAPAROSCOPIC RESIGHTING OF GASTRIC Band PORT;  Surgeon: Luretha Murphy, MD;  Location: [facility];  Service: General;  Laterality: N/A;  . NO PAST SURGERIES       Medications: Current Meds  Medication Sig  . albuterol (PROAIR HFA) 108 (90 Base) MCG/ACT inhaler Inhale 2 puffs into the lungs every 6 (six) hours as needed for wheezing or shortness of breath.   Marland Kitchen albuterol (PROVENTIL) (2.5 MG/3ML) 0.083% nebulizer solution Take 3 mLs (2.5 mg total) by nebulization every 6 (six) hours as needed for wheezing or shortness of breath.  . ALPRAZolam (XANAX) 1 MG tablet Take 1 tablet (1 mg total) by mouth 3 (three) times daily as needed  for anxiety.  Marland Kitchen azelastine (ASTELIN) 0.1 % nasal spray Place 2 sprays into both nostrils 2 (two) times daily.  Marland Kitchen buPROPion 450 MG TB24 Take 450 mg by mouth daily.  . carvedilol (COREG) 25 MG tablet Take 1 tablet (25 mg total) by mouth 2 (two) times daily with a meal.  . cetirizine (ZYRTEC) 10 MG tablet Take 10 mg by mouth daily with breakfast.  . DULoxetine (CYMBALTA) 60 MG capsule Take 1 capsule (60 mg total) by mouth daily.  . fluticasone (FLONASE) 50 MCG/ACT nasal spray Place 1 spray into both nostrils every morning.  . furosemide (LASIX) 40 MG tablet Take 1 tablet (40 mg total) by mouth daily.  Marland Kitchen levalbuterol (XOPENEX HFA) 45 MCG/ACT inhaler  Inhale 2 puffs into the lungs every 6 (six) hours as needed for wheezing.  . levalbuterol (XOPENEX) 1.25 MG/3ML nebulizer solution Take 1.25 mg by nebulization every 6 (six) hours as needed for wheezing.  . mometasone-formoterol (DULERA) 200-5 MCG/ACT AERO Inhale 2 puffs into the lungs 2 (two) times a day.  . montelukast (SINGULAIR) 10 MG tablet TAKE 1 TABLET BY MOUTH EVERYDAY AT BEDTIME  . omeprazole (PRILOSEC) 40 MG capsule TAKE 1 CAPSULE BY MOUTH EVERY DAY  . [DISCONTINUED] carvedilol (COREG) 25 MG tablet Take 25 mg by mouth 2 (two) times daily with a meal.  . [DISCONTINUED] potassium chloride SA (KLOR-CON) 20 MEQ tablet Take 1 tablet (20 mEq total) by mouth every Monday, Wednesday, and Friday.  . [DISCONTINUED] sacubitril-valsartan (ENTRESTO) 24-26 MG Take 1 tablet by mouth 2 (two) times daily.  . [DISCONTINUED] spironolactone (ALDACTONE) 25 MG tablet Take 0.5 tablets (12.5 mg total) by mouth daily. (Patient taking differently: Take 25 mg by mouth daily. )     Allergies: Allergies  Allergen Reactions  . Grass Extracts [Gramineae Pollens]     Nasal congestion, post nasal drip   . Shellfish Allergy Itching    In throat only.    Social History: The patient  reports that she has never smoked. She has never used smokeless tobacco. She reports current alcohol use. She reports that she does not use drugs.   Family History: The patient's family history includes Cancer in her maternal grandfather and maternal uncle.   Review of Systems: Please see the history of present illness.   All other systems are reviewed and negative.   Physical Exam: VS:  BP (!) 130/100   Pulse 100   Ht 5\' 3"  (1.6 m)   Wt (!) 319 lb (144.7 kg)   BMI 56.51 kg/m  .  BMI Body mass index is 56.51 kg/m.  Wt Readings from Last 3 Encounters:  05/07/19 (!) 319 lb (144.7 kg)  04/23/19 (!) 321 lb 12.8 oz (146 kg)  02/12/19 (!) 317 lb 12.8 oz (144.2 kg)    General: Pleasant. Alert and in no acute distress. She is  morbidly obese.  Weight is down just 2 pounds.  HEENT: Normal.  Neck: Supple, no JVD, carotid bruits, or masses noted.  Cardiac: Regular rate and rhythm. +S3 noted. No significant edema. Hard to assess with body habitus.  Respiratory:  Lungs are clear to auscultation bilaterally with normal work of breathing.  GI: Soft and nontender.  MS: Gait and ROM intact.  Skin: Warm and dry. Color is normal.  Neuro:  Strength and sensation are intact and no gross focal deficits noted.  Psych: Alert, appropriate and with normal affect.   LABORATORY DATA:  EKG:  EKG is not ordered today.  Lab Results  Component Value Date   WBC 8.9 10/10/2018   HGB 13.2 10/10/2018   HCT 41.0 10/10/2018   PLT 268 10/10/2018   GLUCOSE 102 (H) 04/23/2019   ALT 15 12/24/2014   AST 20 12/24/2014   NA 143 04/23/2019   K 4.3 04/23/2019   CL 106 04/23/2019   CREATININE 0.98 04/23/2019   BUN 10 04/23/2019   CO2 24 04/23/2019   TSH 0.578 10/05/2018   INR 1.0 12/26/2007     BNP (last 3 results) Recent Labs    10/05/18 2029  BNP 86.2    ProBNP (last 3 results) No results for input(s): PROBNP in the last 8760 hours.   Other Studies Reviewed Today:  ECHO IMPRESSIONS 03/2019  1. Left ventricular ejection fraction, by visual estimation, is 25 to  30%. The left ventricle has severely decreased function. There is no left  ventricular hypertrophy.  2. Severely dilated left ventricular internal cavity size.  3. The left ventricle demonstrates global hypokinesis.  4. Abnormal GLS -10.1 Diffuse hypokinesis worse in septum.  5. Global right ventricle has normal systolic function.The right  ventricular size is normal. No increase in right ventricular wall  thickness.  6. Left atrial size was mildly dilated.  7. Right atrial size was normal.  8. Mild mitral annular calcification.  9. The mitral valve is normal in structure. Mild mitral valve  regurgitation.  10. The tricuspid valve is normal in  structure.  11. The tricuspid valve is normal in structure. Tricuspid valve  regurgitation is mild.  12. The aortic valve is tricuspid. Aortic valve regurgitation is not  visualized. Mild aortic valve sclerosis without stenosis.  13. Pulmonic regurgitation is mild.  14. The pulmonic valve was grossly normal. Pulmonic valve regurgitation is  mild.  15. The interatrial septum was not well visualized.    ASSESSMENT & PLAN:    1. Chronic systolic HF - NYHA II/III - has had recent reduction in EF - plan to titrate back up her medicines - she is already on max dose of Aldactone now - will increase her Entresto today. Felt to have had acute exacerbation over the weekend - needed additional diuretics. Long discussion about need for sodium restriction and daily weights. She admits to stress eating and I think this is going to play a big role in her overall prognosis. May need to push her Coreg further as well. Would assume she will be having another echo - limited - after on better guideline therapy. To also consider left/right heart cath once we have her on good therapy - I will see her again in 2 weeks. She may need referral to Advanced Heart Failure as well.   2. Morbid obesity - she admits to stress eating. Has had prior lap banding - suggested she resume that type of portion control. This will be challenging for her. May benefit from some type of counseling.   3. Snoring/concern for OSA - to have sleep study.   4. HTN - increasing the Entresto today. She is on Coreg, Lasix and Aldactone - her medicines are refilled today.   5. COVID-19 Education: The signs and symptoms of COVID-19 were discussed with the patient and how to seek care for testing (follow up with PCP or arrange E-visit).  The importance of social distancing, staying at home, hand hygiene and wearing a mask when out in public were discussed today.  Current medicines are reviewed with the patient today.  The patient does not have  concerns regarding medicines  other than what has been noted above.  The following changes have been made:  See above.  Labs/ tests ordered today include:    Orders Placed This Encounter  Procedures  . Basic metabolic panel  . Pro b natriuretic peptide (BNP)    Disposition:   FU with me in about 2 weeks - if stable on therapy - will arrange for left/right heart catheterization.   Patient is agreeable to this plan and will call if any problems develop in the interim.   SignedNorma Fredrickson, NP  05/07/2019 11:31 AM  [facility] [address] [place], [place]  [address] Phone: [phone] Fax: [phone]

## 2019-05-03 ENCOUNTER — Telehealth: Payer: Self-pay | Admitting: *Deleted

## 2019-05-03 NOTE — Telephone Encounter (Signed)
-----   Message from Julio Sicks, RN sent at 05/03/2019  3:48 PM EST ----- Sleep study ordered for pt

## 2019-05-03 NOTE — Telephone Encounter (Signed)
Faxed PA request for in lab sleep study ro [facility].

## 2019-05-04 ENCOUNTER — Telehealth: Payer: Self-pay | Admitting: *Deleted

## 2019-05-04 NOTE — Telephone Encounter (Signed)
Staff message sent to [facility] received a call from "Arlys John, customer service advocate" with [facility] giving authorization number for sleep study. Ok to schedule in lab sleep study. Auth # [id number]. Valid dates 05/04/19 to 08/02/19.

## 2019-05-06 ENCOUNTER — Other Ambulatory Visit: Payer: Self-pay | Admitting: Family Medicine

## 2019-05-06 DIAGNOSIS — F418 Other specified anxiety disorders: Secondary | ICD-10-CM

## 2019-05-07 ENCOUNTER — Ambulatory Visit (INDEPENDENT_AMBULATORY_CARE_PROVIDER_SITE_OTHER): Payer: 59 | Admitting: Nurse Practitioner

## 2019-05-07 ENCOUNTER — Encounter: Payer: Self-pay | Admitting: Nurse Practitioner

## 2019-05-07 ENCOUNTER — Other Ambulatory Visit: Payer: Self-pay

## 2019-05-07 VITALS — BP 130/100 | HR 100 | Ht 63.0 in | Wt 319.0 lb

## 2019-05-07 DIAGNOSIS — Z6841 Body Mass Index (BMI) 40.0 and over, adult: Secondary | ICD-10-CM

## 2019-05-07 DIAGNOSIS — I10 Essential (primary) hypertension: Secondary | ICD-10-CM | POA: Diagnosis not present

## 2019-05-07 DIAGNOSIS — I5021 Acute systolic (congestive) heart failure: Secondary | ICD-10-CM | POA: Diagnosis not present

## 2019-05-07 DIAGNOSIS — I5022 Chronic systolic (congestive) heart failure: Secondary | ICD-10-CM

## 2019-05-07 DIAGNOSIS — Z7189 Other specified counseling: Secondary | ICD-10-CM

## 2019-05-07 MED ORDER — CARVEDILOL 25 MG PO TABS: 25.0000 mg | ORAL_TABLET | Freq: Two times a day (BID) | ORAL | 3 refills | Status: DC

## 2019-05-07 MED ORDER — ENTRESTO 49-51 MG PO TABS: 1.0000 | ORAL_TABLET | Freq: Two times a day (BID) | ORAL | 3 refills | Status: DC

## 2019-05-07 MED ORDER — SPIRONOLACTONE 25 MG PO TABS: 25.0000 mg | ORAL_TABLET | Freq: Every day | ORAL | 3 refills | Status: DC

## 2019-05-07 MED ORDER — FUROSEMIDE 40 MG PO TABS: 40.0000 mg | ORAL_TABLET | Freq: Every day | ORAL | 3 refills | Status: DC

## 2019-05-07 NOTE — Patient Instructions (Addendum)
After Visit Summary:  We will be checking the following labs today - BMET and BNP   Medication Instructions:    Continue with your current medicines. BUT  I am increasing the Entresto to the 49-51 mg dose to take twice a day  I sent in a refill for the 25 mg of Coreg - to take twice a day  Stop Potassium.    If you need a refill on your cardiac medications before your next appointment, please call your pharmacy.     Testing/Procedures To Be Arranged:  N/A  Follow-Up:   See Dr. Katrinka Blazing as planned next month.     At [facility], you and your health needs are our priority.  As part of our continuing mission to provide you with exceptional heart care, we have created designated Provider Care Teams.  These Care Teams include your primary Cardiologist (physician) and Advanced Practice Providers (APPs -  Physician Assistants and Nurse Practitioners) who all work together to provide you with the care you need, when you need it.  Special Instructions:  . Stay safe, stay home, wash your hands for at least 20 seconds and wear a mask when out in public.  . It was good to talk with you today. . Weigh each day as we talked about - if your weight goes up more than 2 pounds overnight - take an extra dose of your Lasix/Furosemide    Call the [facility] office at [phone] if you have any questions, problems or concerns.

## 2019-05-08 LAB — BASIC METABOLIC PANEL
BUN/Creatinine Ratio: 11 (ref 9–23)
BUN: 11 mg/dL (ref 6–24)
CO2: 24 mmol/L (ref 20–29)
Calcium: 9.3 mg/dL (ref 8.7–10.2)
Chloride: 103 mmol/L (ref 96–106)
Creatinine, Ser: 1.01 mg/dL — ABNORMAL HIGH (ref 0.57–1.00)
GFR calc Af Amer: 79 mL/min/{1.73_m2} (ref >59)
GFR calc non Af Amer: 69 mL/min/{1.73_m2} (ref >59)
Glucose: 113 mg/dL — ABNORMAL HIGH (ref 65–99)
Potassium: 4.4 mmol/L (ref 3.5–5.2)
Sodium: 143 mmol/L (ref 134–144)

## 2019-05-08 LAB — PRO B NATRIURETIC PEPTIDE: NT-Pro BNP: 79 pg/mL (ref 0–130)

## 2019-05-16 ENCOUNTER — Telehealth: Payer: Self-pay | Admitting: Student

## 2019-05-16 ENCOUNTER — Telehealth: Payer: Self-pay | Admitting: *Deleted

## 2019-05-16 NOTE — Telephone Encounter (Signed)
Patient is scheduled for lab study on 05/24/19. Pt is scheduled for COVID screening on 05/21/19 2:40 SS. Patient understands her sleep study will be done at [facility] sleep lab. Patient understands she will receive a sleep packet in a week or so. Patient understands to call if she does not receive the sleep packet in a timely manner. Patient agrees with treatment and thanked me for call.

## 2019-05-16 NOTE — Telephone Encounter (Signed)
-----   Message from Gaynelle Cage, CMA sent at 05/04/2019  2:36 PM EST ----- Received call from Arlys John, customer advocate with [facility] giving approval for sleep study. Berkley Harvey #[id number]. Valid dates 05/04/19 to 08/02/19. Ok to schedule sleep study. ----- Message ----- From: Julio Sicks, RN Sent: 05/03/2019   3:48 PM EST To: Reesa Chew, CMA, [facility] Sleep Studies  Sleep study ordered for pt

## 2019-05-16 NOTE — Telephone Encounter (Signed)
   Received page from answering.  Called and spoke with patient.  She reports struggling to breathe with associated chest tightness over the past 2 days.  She states it feels like an asthma attack but is also worried that it could be her CHF.  She notes intermittent episodes of substernal non-radiating chest tightness which he again states feels like an asthma attack.  No associated diaphoresis, nausea, vomiting.  She has been chest pain-free for the past 20 minutes.  She take a breathing treatment about 5 hours ago which she thinks helps some.  She denies any weight gain, lower extremity, orthopnea.  However, she still worried that this may be her CHF. Suspect this is more due to her asthma than her CHF. Recommended doing another breathing treatment tonight.  She can take an extra dose of her PO Lasix 40mg  tonight to see if that helps.  If breathing worsens or patient has new/worsening chest discomfort, recommended patient come to the ED for further evaluation. Patient voice understanding and thanked me for call.  , PA-C 05/16/2019 8:26 PM

## 2019-05-17 NOTE — Progress Notes (Deleted)
CARDIOLOGY OFFICE NOTE  Date:  05/17/2019    Samantha Romero Date of Birth: 09-28-1975 Medical Record #[id number]  PCP:  Samantha Guys, MD  Cardiologist:  Samantha Romero   No chief complaint on file.   History of Present Illness: Samantha Romero is a 44 y.o. female who presents today for a follow up visit.  Seen for Dr. Tamala Romero.   She has a history of chronic systolic HF (EF 82% in 11/9369 -->30% 03/2019), obesity, bariatric surgery and asthma.   Seen last month by Dr. Tamala Romero - titrating medical therapy due to reduction in EF. Clinically had improved but plan was to try and get her on better therapy and then arrange cardiac cath. Probably has OSA. Lots of anxiety/stress with job and other issues noted - worsened with the pandemic. I then saw her earlier this month - intermittent congestion. Admits to salt excess and does not like to weigh. Winded easily. Lots of stress and was taking a "90 day sabbatical".   She called last week with worsening symptoms - congestion and chest pain - extra Lasix used.  .  The patient {does/does not:200015} have symptoms concerning for COVID-19 infection (fever, chills, cough, or new shortness of breath).   Comes in today. Here with   Past Medical History:  Diagnosis Date  . Anxiety    panic attacks  . Asthma   . CHF (congestive heart failure), NYHA class II, acute, systolic ([place]) 6/96/7893  . Depression   . Fibromyalgia   . Hypertension    no meds currently  . No pertinent past medical history   . Pneumonia    hx of   . Sinus problem     Past Surgical History:  Procedure Laterality Date  . ESOPHAGOGASTRODUODENOSCOPY N/A 11/26/2014   Procedure: ESOPHAGOGASTRODUODENOSCOPY (EGD);  Surgeon: Samantha Overall, MD;  Location: [facility];  Service: General;  Laterality: N/A;  . GASTRIC BANDING PORT REVISION  08/24/11  . LAPAROSCOPIC GASTRIC BANDING  12/26/2007  . LAPAROSCOPIC LYSIS OF ADHESIONS N/A 01/06/2015   Procedure: LAPAROSCOPIC LYSIS  OF ADHESIONS;  Surgeon: Samantha Hausen, MD;  Location: [facility];  Service: General;  Laterality: N/A;  . LAPAROSCOPIC REPAIR AND REMOVAL OF GASTRIC BAND N/A 01/06/2015   Procedure: LAPAROSCOPIC RESIGHTING OF GASTRIC Band PORT;  Surgeon: Samantha Hausen, MD;  Location: [facility];  Service: General;  Laterality: N/A;  . NO PAST SURGERIES       Medications: No outpatient medications have been marked as taking for the 05/22/19 encounter (Appointment) with Samantha Junes, NP.     Allergies: Allergies  Allergen Reactions  . Grass Extracts [Gramineae Pollens]     Nasal congestion, post nasal drip   . Shellfish Allergy Itching    In throat only.    Social History: The patient  reports that she has never smoked. She has never used smokeless tobacco. She reports current alcohol use. She reports that she does not use drugs.   Family History: The patient's ***family history includes Cancer in her maternal grandfather and maternal uncle.   Review of Systems: Please see the history of present illness.   All other systems are reviewed and negative.   Physical Exam: VS:  There were no vitals taken for this visit. Marland Kitchen  BMI There is no height or weight on file to calculate BMI.  Wt Readings from Last 3 Encounters:  05/07/19 (!) 319 lb (144.7 kg)  04/23/19 (!) 321 lb 12.8 oz (146 kg)  02/12/19 Marland Kitchen)  317 lb 12.8 oz (144.2 kg)    General: Pleasant. Well developed, well nourished and in no acute distress.   HEENT: Normal.  Neck: Supple, no JVD, carotid bruits, or masses noted.  Cardiac: ***Regular rate and rhythm. No murmurs, rubs, or gallops. No edema.  Respiratory:  Lungs are clear to auscultation bilaterally with normal work of breathing.  GI: Soft and nontender.  MS: No deformity or atrophy. Gait and ROM intact.  Skin: Warm and dry. Color is normal.  Neuro:  Strength and sensation are intact and no gross focal deficits noted.  Psych: Alert, appropriate and with normal affect.   LABORATORY  DATA:  EKG:  EKG {ACTION; IS/IS [id number]} ordered today. This demonstrates ***.  Lab Results  Component Value Date   WBC 8.9 10/10/2018   HGB 13.2 10/10/2018   HCT 41.0 10/10/2018   PLT 268 10/10/2018   GLUCOSE 113 (H) 05/07/2019   ALT 15 12/24/2014   AST 20 12/24/2014   NA 143 05/07/2019   K 4.4 05/07/2019   CL 103 05/07/2019   CREATININE 1.01 (H) 05/07/2019   BUN 11 05/07/2019   CO2 24 05/07/2019   TSH 0.578 10/05/2018   INR 1.0 12/26/2007     BNP (last 3 results) Recent Labs    10/05/18 2029  BNP 86.2    ProBNP (last 3 results) Recent Labs    05/07/19 1137  PROBNP 79     Other Studies Reviewed Today:   ECHO IMPRESSIONS 03/2019  1. Left ventricular ejection fraction, by visual estimation, is 25 to  30%. The left ventricle has severely decreased function. There is no left  ventricular hypertrophy.  2. Severely dilated left ventricular internal cavity size.  3. The left ventricle demonstrates global hypokinesis.  4. Abnormal GLS -10.1 Diffuse hypokinesis worse in septum.  5. Global right ventricle has normal systolic function.The right  ventricular size is normal. No increase in right ventricular wall  thickness.  6. Left atrial size was mildly dilated.  7. Right atrial size was normal.  8. Mild mitral annular calcification.  9. The mitral valve is normal in structure. Mild mitral valve  regurgitation.  10. The tricuspid valve is normal in structure.  11. The tricuspid valve is normal in structure. Tricuspid valve  regurgitation is mild.  12. The aortic valve is tricuspid. Aortic valve regurgitation is not  visualized. Mild aortic valve sclerosis without stenosis.  13. Pulmonic regurgitation is mild.  14. The pulmonic valve was grossly normal. Pulmonic valve regurgitation is  mild.  15. The interatrial septum was not well visualized.    ASSESSMENT & PLAN:   1. Chronic systolic HF - NYHA II/III - has had recent reduction in EF -  plan to titrate back up her medicines - she is already on max dose of Aldactone now - will increase her Entresto today. Felt to have had acute exacerbation over the weekend - needed additional diuretics. Long discussion about need for sodium restriction and daily weights. She admits to stress eating and I think this is going to play a big role in her Romero prognosis. May need to push her Coreg further as well. Would assume she will be having another echo - limited - after on better guideline therapy. To also consider left/right heart cath once we have her on good therapy - I will see her again in 2 weeks. She may need referral to Advanced Heart Failure as well.   2. Morbid obesity - she admits to stress eating. Has  had prior lap banding - suggested she resume that type of portion control. This will be challenging for her. May benefit from some type of counseling.   3. Snoring/concern for OSA - to have sleep study.   4. HTN - increasing the Entresto today. She is on Coreg, Lasix and Aldactone - her medicines are refilled today.   Marland Kitchen COVID-19 Education: The signs and symptoms of COVID-19 were discussed with the patient and how to seek care for testing (follow up with PCP or arrange E-visit).  The importance of social distancing, staying at home, hand hygiene and wearing a mask when out in public were discussed today.  Current medicines are reviewed with the patient today.  The patient does not have concerns regarding medicines other than what has been noted above.  The following changes have been made:  See above.  Labs/ tests ordered today include:   No orders of the defined types were placed in this encounter.    Disposition:   FU with *** in {gen number [id number]} {Days to years:10300}.   Patient is agreeable to this plan and will call if any problems develop in the interim.   SignedNorma Fredrickson, NP  05/17/2019 8:45 AM  [facility] [address] [place], [place]  [address] Phone: [phone] Fax: [phone]

## 2019-05-21 ENCOUNTER — Inpatient Hospital Stay (HOSPITAL_COMMUNITY): Admission: RE | Admit: 2019-05-21 | Payer: 59 | Source: Ambulatory Visit

## 2019-05-22 ENCOUNTER — Ambulatory Visit: Payer: 59 | Admitting: Nurse Practitioner

## 2019-05-22 ENCOUNTER — Other Ambulatory Visit (HOSPITAL_COMMUNITY)
Admission: RE | Admit: 2019-05-22 | Discharge: 2019-05-22 | Disposition: A | Discharge status: Home / Self Care | Payer: 59 | Source: Ambulatory Visit | Attending: Cardiology | Admitting: Cardiology

## 2019-05-22 ENCOUNTER — Ambulatory Visit: Payer: BC Managed Care – PPO | Admitting: Interventional Cardiology

## 2019-05-22 DIAGNOSIS — Z01812 Encounter for preprocedural laboratory examination: Secondary | ICD-10-CM | POA: Insufficient documentation

## 2019-05-22 DIAGNOSIS — Z20822 Contact with and (suspected) exposure to covid-19: Secondary | ICD-10-CM | POA: Diagnosis not present

## 2019-05-22 LAB — SARS CORONAVIRUS 2 (TAT 6-24 HRS): SARS Coronavirus 2: NEGATIVE

## 2019-05-24 ENCOUNTER — Other Ambulatory Visit: Payer: Self-pay

## 2019-05-24 ENCOUNTER — Ambulatory Visit (HOSPITAL_BASED_OUTPATIENT_CLINIC_OR_DEPARTMENT_OTHER): Payer: 59 | Attending: Interventional Cardiology | Admitting: Cardiology

## 2019-05-24 DIAGNOSIS — G4733 Obstructive sleep apnea (adult) (pediatric): Secondary | ICD-10-CM | POA: Insufficient documentation

## 2019-05-24 DIAGNOSIS — R0683 Snoring: Secondary | ICD-10-CM | POA: Diagnosis present

## 2019-05-24 DIAGNOSIS — Z79899 Other long term (current) drug therapy: Secondary | ICD-10-CM | POA: Insufficient documentation

## 2019-05-25 ENCOUNTER — Other Ambulatory Visit (HOSPITAL_BASED_OUTPATIENT_CLINIC_OR_DEPARTMENT_OTHER): Payer: Self-pay

## 2019-05-25 DIAGNOSIS — R0683 Snoring: Secondary | ICD-10-CM

## 2019-05-27 NOTE — Procedures (Signed)
   Patient Name: Samantha Romero, Samantha Romero Date:05/24/2019 Gender: Female D.O.B: September 07, 1975 Age (years): 42 Referring Provider: Armanda Magic MD, ABSM Height (inches): 63 Interpreting Physician: Armanda Magic MD, ABSM Weight (lbs): 315 RPSGT: Lowry Ram BMI: 56 MRN: [id number] Neck Size: 17.50  CLINICAL INFORMATION Sleep Study Type: NPSG  Indication for sleep study: Congestive Heart Failure, Depression, Hypertension, Obesity, Snoring  Epworth Sleepiness Score: 8  SLEEP STUDY TECHNIQUE As per the AASM Manual for the Scoring of Sleep and Associated Events v2.3 (April 2016) with a hypopnea requiring 4% desaturations.  The channels recorded and monitored were frontal, central and occipital EEG, electrooculogram (EOG), submentalis EMG (chin), nasal and oral airflow, thoracic and abdominal wall motion, anterior tibialis EMG, snore microphone, electrocardiogram, and pulse oximetry.  MEDICATIONS Medications self-administered by patient taken the night of the study : CARVEDILOL, PRILOSEC, LASIX, SPIRONOLACTONE, Entresto  SLEEP ARCHITECTURE The study was initiated at 10:56:31 PM and ended at 5:26:48 AM.  Sleep onset time was 10.9 minutes and the sleep efficiency was 94.4%. The total sleep time was 368.5 minutes.  Stage REM latency was 312.5 minutes.  The patient spent 3.3% of the night in stage N1 sleep, 90.8% in stage N2 sleep, 0.0% in stage N3 and 6% in REM.  Alpha intrusion was absent.  Supine sleep was 73.95%.  RESPIRATORY PARAMETERS The overall apnea/hypopnea index (AHI) was 14.5 per hour. There were 0 total apneas, including 0 obstructive, 0 central and 0 mixed apneas. There were 89 hypopneas and 0 RERAs.  The AHI during Stage REM sleep was 19.1 per hour.  AHI while supine was 9.7 per hour.  The mean oxygen saturation was 93.8%. The minimum SpO2 during sleep was 82.0%.  moderate snoring was noted during this study.  CARDIAC DATA The 2 lead EKG demonstrated sinus  rhythm. The mean heart rate was 81.4 beats per minute. Other EKG findings include: None.  LEG MOVEMENT DATA The total PLMS were 0 with a resulting PLMS index of 0.0. Associated arousal with leg movement index was 0.0 .  IMPRESSIONS - Mild obstructive sleep apnea occurred during this study (AHI = 14.5/h). - No significant central sleep apnea occurred during this study (CAI = 0.0/h). - Mild oxygen desaturation was noted during this study (Min O2 = 82.0%). - The patient snored with moderate snoring volume. - No cardiac abnormalities were noted during this study. - Clinically significant periodic limb movements did not occur during sleep. No significant associated arousals.  DIAGNOSIS - Obstructive Sleep Apnea (327.23 [G47.33 ICD-10])  RECOMMENDATIONS - Therapeutic CPAP titration to determine optimal pressure required to alleviate sleep disordered breathing. - Avoid alcohol, sedatives and other CNS depressants that may worsen sleep apnea and disrupt normal sleep architecture. - Sleep hygiene should be reviewed to assess factors that may improve sleep quality. - Weight management and regular exercise should be initiated or continued if appropriate.  [Electronically signed] 05/27/2019 02:32 PM  Armanda Magic MD, ABSM Diplomate, American Board of Sleep Medicine

## 2019-05-28 ENCOUNTER — Telehealth: Payer: Self-pay | Admitting: *Deleted

## 2019-05-28 NOTE — Telephone Encounter (Signed)
-----   Message from Traci R Turner, MD sent at 05/27/2019  2:35 PM EST ----- Please let patient know that they have sleep apnea - please set up virtual visit to discuss results 

## 2019-05-31 ENCOUNTER — Telehealth: Payer: Self-pay | Admitting: *Deleted

## 2019-05-31 NOTE — Telephone Encounter (Signed)
Called results lmtcb. 

## 2019-05-31 NOTE — Telephone Encounter (Signed)
-----   Message from Quintella Reichert, MD sent at 05/27/2019  2:35 PM EST ----- Please let patient know that they have sleep apnea - please set up virtual visit to discuss results

## 2019-06-03 NOTE — Progress Notes (Signed)
Cardiology Office Note   Date:  06/06/2019   ID:  Samantha Romero, DOB October 31, 1975, MRN [id number]  PCP:  Samantha Lipps, MD  Cardiologist:  Dr. Katrinka Blazing, MD  Chief Complaint  Patient presents with  . Follow-up   History of Present Illness: Samantha Romero is a 44 y.o. female who presents for follow-up, seen for Dr. Katrinka Romero.  [name] has a history of chronic systolic HF with an LVEF at 40% per echocardiogram 09/2018 with further reduction to 30% 03/2019.  Also with obesity status post bariatric surgery and asthma.  She was last seen by Samantha Fredrickson, NP 05/07/2019 at which time plan was for continued medical therapy titration due to reduced EF. She was noted to be undergoing lots of anxiety/stress with her job seem to have evidence of fluid volume overload with complaints of "rattling in her chest"felt to be secondary to increased sodium intake.  She took an extra dose of Lasix with good response.  She was noted to already be on max dose of Aldactone therefore her Sherryll Burger was increased.  Long discussion regarding sodium restriction and daily weights.  She was stressed eating at that time.  May need further titration of carvedilol.  Plan was for reevaluation of LV function once on guideline directed therapy.  Also consider right and left LHC and possible referral to advanced heart failure  Outpatient call on 05/16/2019 with having difficulty with breathing and chest pain for 2 days. Reports it felt like an asthma attack however was worried about CHF exacerbation. Provider on-call felt symptoms likely due to her asthma rather than CHF. Recommendations were to do another breathing treatment and take an echo just of Lasix 40 mg  Sleep study performed 05/24/2019 which showed mild obstructive sleep apnea, no significant central sleep apnea, mild oxygen desaturation, no cardiac abnormalities with a diagnosis of OSA.  Therapeutic CPAP titration was recommended.  Avoid alcohol, sedatives and other CNS  depressants which may worsen sleep apnea and disrupt normal sleep architecture.  Today she comes for follow up and reports that she will continue to have what sounds like mild orthopnea symptoms.  Lasix instructions were for as needed dosing if weight gain or shortness of breath.  She reports that since last OV she has been needing the as needed dosing almost daily.  She does state that her symptoms will subside.  Most of the time she feels the need to take the extra dose at night.  Given this, we will obtain lab work today to ensure stable creatinine and increase her Lasix to 40 mg twice daily.  BP is elevated today at 146/88 and therefore I feel she will tolerate high-dose Entresto at this time.  She is close follow-up with Dr. Katrinka Romero in 3 weeks.  She denies chest pain, palpitations, LE edema, dizziness or syncope.  Past Medical History:  Diagnosis Date  . Anxiety    panic attacks  . Asthma   . CHF (congestive heart failure), NYHA class II, acute, systolic (HCC) 10/06/2018  . Depression   . Fibromyalgia   . Hypertension    no meds currently  . No pertinent past medical history   . Pneumonia    hx of   . Sinus problem     Past Surgical History:  Procedure Laterality Date  . ESOPHAGOGASTRODUODENOSCOPY N/A 11/26/2014   Procedure: ESOPHAGOGASTRODUODENOSCOPY (EGD);  Surgeon: Ovidio Kin, MD;  Location: [facility];  Service: General;  Laterality: N/A;  . GASTRIC BANDING PORT REVISION  08/24/11  . LAPAROSCOPIC GASTRIC BANDING  12/26/2007  . LAPAROSCOPIC LYSIS OF ADHESIONS N/A 01/06/2015   Procedure: LAPAROSCOPIC LYSIS OF ADHESIONS;  Surgeon: Luretha Murphy, MD;  Location: [facility];  Service: General;  Laterality: N/A;  . LAPAROSCOPIC REPAIR AND REMOVAL OF GASTRIC BAND N/A 01/06/2015   Procedure: LAPAROSCOPIC RESIGHTING OF GASTRIC Band PORT;  Surgeon: Luretha Murphy, MD;  Location: [facility];  Service: General;  Laterality: N/A;  . NO PAST SURGERIES       Current Outpatient Medications   Medication Sig Dispense Refill  . albuterol (PROAIR HFA) 108 (90 Base) MCG/ACT inhaler Inhale 2 puffs into the lungs every 6 (six) hours as needed for wheezing or shortness of breath.     Marland Kitchen albuterol (PROVENTIL) (2.5 MG/3ML) 0.083% nebulizer solution Take 3 mLs (2.5 mg total) by nebulization every 6 (six) hours as needed for wheezing or shortness of breath. 150 mL 1  . ALPRAZolam (XANAX) 1 MG tablet Take 1 tablet (1 mg total) by mouth 3 (three) times daily as needed for anxiety. 40 tablet 2  . azelastine (ASTELIN) 0.1 % nasal spray Place 2 sprays into both nostrils 2 (two) times daily. 30 mL 5  . buPROPion 450 MG TB24 Take 450 mg by mouth daily. 90 tablet 1  . carvedilol (COREG) 25 MG tablet Take 1 tablet (25 mg total) by mouth 2 (two) times daily with a meal. 180 tablet 3  . cetirizine (ZYRTEC) 10 MG tablet Take 10 mg by mouth daily with breakfast.    . DULoxetine (CYMBALTA) 60 MG capsule Take 1 capsule (60 mg total) by mouth daily. 90 capsule 1  . fluticasone (FLONASE) 50 MCG/ACT nasal spray Place 1 spray into both nostrils every morning.    . furosemide (LASIX) 40 MG tablet Take 1 tablet (40 mg total) by mouth 2 (two) times daily. 180 tablet 3  . levalbuterol (XOPENEX HFA) 45 MCG/ACT inhaler Inhale 2 puffs into the lungs every 6 (six) hours as needed for wheezing. 1 Inhaler 1  . levalbuterol (XOPENEX) 1.25 MG/3ML nebulizer solution Take 1.25 mg by nebulization every 6 (six) hours as needed for wheezing. 72 mL 5  . mometasone-formoterol (DULERA) 200-5 MCG/ACT AERO Inhale 2 puffs into the lungs 2 (two) times a day. 13 g 5  . montelukast (SINGULAIR) 10 MG tablet TAKE 1 TABLET BY MOUTH EVERYDAY AT BEDTIME 90 tablet 1  . omeprazole (PRILOSEC) 40 MG capsule TAKE 1 CAPSULE BY MOUTH EVERY DAY 90 capsule 1  . spironolactone (ALDACTONE) 25 MG tablet Take 1 tablet (25 mg total) by mouth daily. 90 tablet 3  . sacubitril-valsartan (ENTRESTO) 97-103 MG Take 1 tablet by mouth 2 (two) times daily. 60 tablet 6    No current facility-administered medications for this visit.    Allergies:   Grass extracts [gramineae pollens] and Shellfish allergy    Social History:  The patient  reports that she has never smoked. She has never used smokeless tobacco. She reports current alcohol use. She reports that she does not use drugs.   Family History:  The patient's family history includes Cancer in her maternal grandfather and maternal uncle.    ROS:  Please see the history of present illness.Otherwise, review of systems are positive for none.   All other systems are reviewed and negative.    PHYSICAL EXAM: VS:  BP (!) 146/88   Pulse (!) 102   Ht 5\' 3"  (1.6 m)   Wt (!) 316 lb 6.4 oz (143.5 kg)   SpO2 97%  BMI 56.05 kg/m  , BMI Body mass index is 56.05 kg/m.   General: Well developed, well nourished, NAD Neck: Negative for carotid bruits. No JVD Lungs:Clear to ausculation bilaterally. No wheezes, rales, or rhonchi. Breathing is unlabored. Cardiovascular: RRR with S1 S2. No murmurs, rubs, gallops, or LV heave appreciated. Extremities: No edema. Radial  pulses 2+ bilaterally Neuro: Alert and oriented. No focal deficits. No facial asymmetry. MAE spontaneously. Psych: Responds to questions appropriately with normal affect.     EKG:  EKG is ordered today. The ekg ordered today demonstrates ST with HR 102bpm, no acute changes    Recent Labs: 10/05/2018: B Natriuretic Peptide 86.2; TSH 0.578 10/10/2018: Hemoglobin 13.2; Platelets 268 05/07/2019: BUN 11; Creatinine, Ser 1.01; NT-Pro BNP 79; Potassium 4.4; Sodium 143    Lipid Panel No results found for: CHOL, TRIG, HDL, CHOLHDL, VLDL, LDLCALC, LDLDIRECT    Wt Readings from Last 3 Encounters:  06/06/19 (!) 316 lb 6.4 oz (143.5 kg)  05/24/19 (!) 315 lb (142.9 kg)  05/07/19 (!) 319 lb (144.7 kg)    Other studies Reviewed: Additional studies/ records that were reviewed today include:   Echocardiogram 04/16/2019:  1. Left ventricular ejection  fraction, by visual estimation, is 25 to  30%. The left ventricle has severely decreased function. There is no left  ventricular hypertrophy.  2. Severely dilated left ventricular internal cavity size.  3. The left ventricle demonstrates global hypokinesis.  4. Abnormal GLS -10.1 Diffuse hypokinesis worse in septum.  5. Global right ventricle has normal systolic function.The right  ventricular size is normal. No increase in right ventricular wall  thickness.  6. Left atrial size was mildly dilated.  7. Right atrial size was normal.  8. Mild mitral annular calcification.  9. The mitral valve is normal in structure. Mild mitral valve  regurgitation.  10. The tricuspid valve is normal in structure.  11. The tricuspid valve is normal in structure. Tricuspid valve  regurgitation is mild.  12. The aortic valve is tricuspid. Aortic valve regurgitation is not  visualized. Mild aortic valve sclerosis without stenosis.  13. Pulmonic regurgitation is mild.  14. The pulmonic valve was grossly normal. Pulmonic valve regurgitation is  mild.  15. The interatrial septum was not well visualized.  Sound like MS  ASSESSMENT AND PLAN:  1.  Chronic systolic CHF: -Was having issues with fluid volume overload at last office visit 05/07/2019 noted to have increased intake of sodium therefore she took extra Lasix dosing with improvement.  Reports has been needing her as needed Lasix dosing almost daily at this point secondary to what sounds like orthopnea symptoms.  Has improvement with extra dosing therefore we will increase her to Lasix 40 mg p.o. twice daily  -Entresto increased to 49-51 at last OV>>> given stable (elevated 146/88) we will increase Entresto to high-dose and follow lab work closely -Continue Aldactone 25 mg daily, carvedilol 25 daily -We will need repeat echocardiogram in 3 months  2.  Morbid obesity: -Weight, 315lb -Not discussed today  3.  OSA: -Sleep study performed 05/24/2019  which showed mild OSA with recommendations for CPAP titration -Followed by Dr. Radford Pax  4.  HTN: -Stable, 146/88 -Entresto increased at last OV>> will increase again today to high dose -Continue carvedilol, Lasix, Aldactone -Also will be increasing Lasix to 40 mg twice daily given symptoms of orthopnea   Current medicines are reviewed at length with the patient today.  The patient does not have concerns regarding medicines.  The following changes have been  made: Increase Entresto to 97/103, increase Lasix to 40 mg twice daily  Labs/ tests ordered today include: BMET  Orders Placed This Encounter  Procedures  . Basic metabolic panel  . EKG 12-Lead    Disposition:   FU with Dr. Katrinka Romero in 3 weeks  Signed, Georgie Chard, NP  06/06/2019 4:37 PM    [facility] [address] [place], [place], [place]  [address] Phone: [phone]; Fax: [phone]

## 2019-06-06 ENCOUNTER — Other Ambulatory Visit: Payer: Self-pay

## 2019-06-06 ENCOUNTER — Ambulatory Visit (INDEPENDENT_AMBULATORY_CARE_PROVIDER_SITE_OTHER): Payer: 59 | Admitting: Cardiology

## 2019-06-06 ENCOUNTER — Encounter: Payer: Self-pay | Admitting: Cardiology

## 2019-06-06 VITALS — BP 146/88 | HR 102 | Ht 63.0 in | Wt 316.4 lb

## 2019-06-06 DIAGNOSIS — I10 Essential (primary) hypertension: Secondary | ICD-10-CM

## 2019-06-06 DIAGNOSIS — I5021 Acute systolic (congestive) heart failure: Secondary | ICD-10-CM

## 2019-06-06 DIAGNOSIS — Z79899 Other long term (current) drug therapy: Secondary | ICD-10-CM | POA: Diagnosis not present

## 2019-06-06 DIAGNOSIS — I5022 Chronic systolic (congestive) heart failure: Secondary | ICD-10-CM

## 2019-06-06 DIAGNOSIS — I428 Other cardiomyopathies: Secondary | ICD-10-CM

## 2019-06-06 MED ORDER — SPIRONOLACTONE 25 MG PO TABS: 25.0000 mg | ORAL_TABLET | Freq: Every day | ORAL | 3 refills | Status: DC

## 2019-06-06 MED ORDER — FUROSEMIDE 40 MG PO TABS: 40.0000 mg | ORAL_TABLET | Freq: Two times a day (BID) | ORAL | 3 refills | Status: DC

## 2019-06-06 MED ORDER — ENTRESTO 97-103 MG PO TABS: 1.0000 | ORAL_TABLET | Freq: Two times a day (BID) | ORAL | 6 refills | Status: DC

## 2019-06-06 NOTE — Patient Instructions (Addendum)
Medication Instructions:   Your physician has recommended you make the following change in your medication:   1) Increase Entresto to 97-103 mg, 1 tablet by mouth twice a day 2) Increase Furosemide to 40 mg, 1 tablet by mouth twice a day  *If you need a refill on your cardiac medications before your next appointment, please call your pharmacy*  Lab Work:  You will have labs drawn today: BMET  If you have labs (blood work) drawn today and your tests are completely normal, you will receive your results only by: Marland Kitchen MyChart Message (if you have MyChart) OR . A paper copy in the mail If you have any lab test that is abnormal or we need to change your treatment, we will call you to review the results.  Testing/Procedures:  None ordered today  Follow-Up: At [facility], you and your health needs are our priority.  As part of our continuing mission to provide you with exceptional heart care, we have created designated Provider Care Teams.  These Care Teams include your primary Cardiologist (physician) and Advanced Practice Providers (APPs -  Physician Assistants and Nurse Practitioners) who all work together to provide you with the care you need, when you need it.  We recommend signing up for the patient portal called "MyChart".  Sign up information is provided on this After Visit Summary.  MyChart is used to connect with patients for Virtual Visits (Telemedicine).  Patients are able to view lab/test results, encounter notes, upcoming appointments, etc.  Non-urgent messages can be sent to your provider as well.   To learn more about what you can do with MyChart, go to [email or web address].    Your next appointment:    Keep follow up with Verdis Prime, MD on 06/22/19 at 11:40AM

## 2019-06-07 NOTE — Telephone Encounter (Signed)
Called results Samantha Romero and to inform her I made the appointment with dr Mayford Knife for 07/18/19 at 9:40.  Left detailed message on voicemail with date and time of appointment and informed patient to call back to confirm or reschedule.

## 2019-06-11 LAB — BASIC METABOLIC PANEL
BUN/Creatinine Ratio: 12 (ref 9–23)
BUN: 12 mg/dL (ref 6–24)
CO2: 22 mmol/L (ref 20–29)
Calcium: 8.7 mg/dL (ref 8.7–10.2)
Chloride: 104 mmol/L (ref 96–106)
Creatinine, Ser: 1.01 mg/dL — ABNORMAL HIGH (ref 0.57–1.00)
GFR calc Af Amer: 79 mL/min/{1.73_m2} (ref >59)
GFR calc non Af Amer: 69 mL/min/{1.73_m2} (ref >59)
Glucose: 99 mg/dL (ref 65–99)
Potassium: 4.9 mmol/L (ref 3.5–5.2)
Sodium: 139 mmol/L (ref 134–144)

## 2019-06-21 NOTE — Progress Notes (Addendum)
Cardiology Office Note:    Date:  06/22/2019   ID:  Samantha Romero, DOB Apr 04, 1975, MRN [id number]  PCP:  Myles Lipps, MD  Cardiologist:  Lesleigh Noe, MD   Referring MD: Myles Lipps, MD   Chief Complaint  Patient presents with  . Congestive Heart Failure  . Shortness of Breath  . Advice Only    Obesity    History of Present Illness:    Samantha Romero is a 44 y.o. female with a hx of systolic heart failure( 7/20 EF 40% --> 1/21 EF 30%), obesity, mild obstructive sleep apnea, bariatric surgery, and asthma. On guideline directed therapy initiated in process of being titrated. Lasix recently increased to 40 mg BID.  She is doing well.  She improved after furosemide was increased to 40 mg twice daily.  No recurrence of orthopnea.  She is compliant with her medication regimen.  She does have mild obstructive sleep apnea but has not yet started CPAP.  She has not had lower extremity swelling.  Energy level is been improved.  She denies chest pain.  She is concerned about her mortality.  She gets frightened that she may have a sudden cardiac event.  She lives alone.  She has anxiety about exercising by herself.  Past Medical History:  Diagnosis Date  . Anxiety    panic attacks  . Asthma   . CHF (congestive heart failure), NYHA class II, acute, systolic (HCC) 10/06/2018  . Depression   . Fibromyalgia   . Hypertension    no meds currently  . No pertinent past medical history   . Pneumonia    hx of   . Sinus problem     Past Surgical History:  Procedure Laterality Date  . ESOPHAGOGASTRODUODENOSCOPY N/A 11/26/2014   Procedure: ESOPHAGOGASTRODUODENOSCOPY (EGD);  Surgeon: Ovidio Kin, MD;  Location: [facility];  Service: General;  Laterality: N/A;  . GASTRIC BANDING PORT REVISION  08/24/11  . LAPAROSCOPIC GASTRIC BANDING  12/26/2007  . LAPAROSCOPIC LYSIS OF ADHESIONS N/A 01/06/2015   Procedure: LAPAROSCOPIC LYSIS OF ADHESIONS;  Surgeon: Luretha Murphy, MD;  Location:  [facility];  Service: General;  Laterality: N/A;  . LAPAROSCOPIC REPAIR AND REMOVAL OF GASTRIC BAND N/A 01/06/2015   Procedure: LAPAROSCOPIC RESIGHTING OF GASTRIC Band PORT;  Surgeon: Luretha Murphy, MD;  Location: [facility];  Service: General;  Laterality: N/A;  . NO PAST SURGERIES      Current Medications: Current Meds  Medication Sig  . albuterol (PROAIR HFA) 108 (90 Base) MCG/ACT inhaler Inhale 2 puffs into the lungs every 6 (six) hours as needed for wheezing or shortness of breath.   Marland Kitchen albuterol (PROVENTIL) (2.5 MG/3ML) 0.083% nebulizer solution Take 3 mLs (2.5 mg total) by nebulization every 6 (six) hours as needed for wheezing or shortness of breath.  . ALPRAZolam (XANAX) 1 MG tablet Take 1 tablet (1 mg total) by mouth 3 (three) times daily as needed for anxiety.  Marland Kitchen azelastine (ASTELIN) 0.1 % nasal spray Place 2 sprays into both nostrils 2 (two) times daily.  Marland Kitchen buPROPion 450 MG TB24 Take 450 mg by mouth daily.  . carvedilol (COREG) 25 MG tablet Take 1 tablet (25 mg total) by mouth 2 (two) times daily with a meal.  . cetirizine (ZYRTEC) 10 MG tablet Take 10 mg by mouth daily with breakfast.  . DULoxetine (CYMBALTA) 60 MG capsule Take 1 capsule (60 mg total) by mouth daily.  . fluticasone (FLONASE) 50 MCG/ACT nasal spray Place 1 spray  into both nostrils every morning.  . furosemide (LASIX) 40 MG tablet Take 1 tablet (40 mg total) by mouth 2 (two) times daily.  Marland Kitchen levalbuterol (XOPENEX HFA) 45 MCG/ACT inhaler Inhale 2 puffs into the lungs every 6 (six) hours as needed for wheezing.  . levalbuterol (XOPENEX) 1.25 MG/3ML nebulizer solution Take 1.25 mg by nebulization every 6 (six) hours as needed for wheezing.  . mometasone-formoterol (DULERA) 200-5 MCG/ACT AERO Inhale 2 puffs into the lungs 2 (two) times a day.  . montelukast (SINGULAIR) 10 MG tablet TAKE 1 TABLET BY MOUTH EVERYDAY AT BEDTIME  . omeprazole (PRILOSEC) 40 MG capsule TAKE 1 CAPSULE BY MOUTH EVERY DAY  . sacubitril-valsartan  (ENTRESTO) 97-103 MG Take 1 tablet by mouth 2 (two) times daily.  Marland Kitchen spironolactone (ALDACTONE) 25 MG tablet Take 1 tablet (25 mg total) by mouth daily.     Allergies:   Grass extracts [gramineae pollens] and Shellfish allergy   Social History   Socioeconomic History  . Marital status: Single    Spouse name: Not on file  . Number of children: Not on file  . Years of education: Not on file  . Highest education level: Not on file  Occupational History  . Not on file  Tobacco Use  . Smoking status: Never Smoker  . Smokeless tobacco: Never Used  Substance and Sexual Activity  . Alcohol use: Yes    Comment: socially  . Drug use: Never  . Sexual activity: Not Currently    Birth control/protection: I.U.D.    Comment: Mirena  Other Topics Concern  . Not on file  Social History Narrative  . Not on file   Social Determinants of Health   Financial Resource Strain:   . Difficulty of Paying Living Expenses:   Food Insecurity:   . Worried About Programme researcher, broadcasting/film/video in the Last Year:   . Barista in the Last Year:   Transportation Needs:   . Freight forwarder (Medical):   Marland Kitchen Lack of Transportation (Non-Medical):   Physical Activity:   . Days of Exercise per Week:   . Minutes of Exercise per Session:   Stress:   . Feeling of Stress :   Social Connections:   . Frequency of Communication with Friends and Family:   . Frequency of Social Gatherings with Friends and Family:   . Attends Religious Services:   . Active Member of Clubs or Organizations:   . Attends Banker Meetings:   Marland Kitchen Marital Status:      Family History: The patient's family history includes Cancer in her maternal grandfather and maternal uncle.  ROS:   Please see the history of present illness.    Anxiety concerning her prognosis.  Asthma has not been a significant problem since increasing furosemide dose.  Last laboratory data done March 10 was on her current medication regimen.  Potassium  was 4.9 and creatinine was normal.  All other systems reviewed and are negative.  EKGs/Labs/Other Studies Reviewed:    The following studies were reviewed today:  2D Doppler echocardiogram last done in January 2021.  Ejection fraction at that time was 25 to 30%.  EKG:  EKG a new electrocardiogram was not performed today.  Recent Labs: 10/05/2018: B Natriuretic Peptide 86.2; TSH 0.578 10/10/2018: Hemoglobin 13.2; Platelets 268 05/07/2019: NT-Pro BNP 79 06/06/2019: BUN 12; Creatinine, Ser 1.01; Potassium 4.9; Sodium 139  Recent Lipid Panel No results found for: CHOL, TRIG, HDL, CHOLHDL, VLDL, LDLCALC, LDLDIRECT  Physical  Exam:    VS:  BP 124/84   Pulse (!) 103   Ht 5\' 3"  (1.6 m)   Wt (!) 317 lb (143.8 kg)   SpO2 98%   BMI 56.15 kg/m     Wt Readings from Last 3 Encounters:  06/22/19 (!) 317 lb (143.8 kg)  06/06/19 (!) 316 lb 6.4 oz (143.5 kg)  05/24/19 (!) 315 lb (142.9 kg)     GEN: Morbid obesity with BMI 56 kg/m. No acute distress HEENT: Normal NECK: No JVD. LYMPHATICS: No lymphadenopathy CARDIAC:  RRR without murmur, gallop, or edema. VASCULAR:  Normal Pulses. No bruits. RESPIRATORY:  Clear to auscultation without rales, wheezing or rhonchi  ABDOMEN: Soft, non-tender, non-distended, No pulsatile mass, MUSCULOSKELETAL: No deformity  SKIN: Warm and dry NEUROLOGIC:  Alert and oriented x 3 PSYCHIATRIC:  Normal affect   ASSESSMENT:    1. Chronic systolic heart failure ([place])   2. Essential hypertension   3. Class 3 severe obesity due to excess calories without serious comorbidity with body mass index (BMI) of 45.0 to 49.9 in adult [facility])   4. Educated about COVID-19 virus infection    PLAN:    In order of problems listed above:  1. Guideline directed therapy includes carvedilol 25 mg twice daily Entresto 97/103 mg twice daily, spironolactone 25 mg/day, and furosemide 40 mg twice daily for decongestion.  40 mg alone was not adequate.  We will plan to do an echocardiogram  in the September or October timeframe.  Follow-up office visit for heart failure will be in 3 months, around June 2021.  I recommend phase 2 cardiac rehab.  Basic metabolic panel, BNP, and hemoglobin on return in 3 months. 2. With therapy of heart failure blood pressure is under excellent control. 3. Increase physical activity to help with weight loss. 4. She has been vaccinated and is still practicing social distancing.  Guideline directed therapy for left ventricular systolic dysfunction: Angiotensin receptor-neprilysin inhibitor (ARNI)-Entresto; beta-blocker therapy - carvedilol or metoprolol succinate; mineralocorticoid receptor antagonist (MRA) therapy -spironolactone or eplerenone.  These therapies have been shown to improve clinical outcomes including reduction of rehospitalization survival, and acute heart failure.  Consider adding SGLT2 therapy.   74-month follow-up.  Education, coaching, and encouragement occupied a significant period of time during this office appointment.  Medication Adjustments/Labs and Tests Ordered: Current medicines are reviewed at length with the patient today.  Concerns regarding medicines are outlined above.  Orders Placed This Encounter  Procedures  . Pro b natriuretic peptide  . Basic metabolic panel  . CBC  . AMB referral to cardiac rehabilitation   No orders of the defined types were placed in this encounter.   Patient Instructions  Medication Instructions:  Your physician recommends that you continue on your current medications as directed. Please refer to the Current Medication list given to you today.  *If you need a refill on your cardiac medications before your next appointment, please call your pharmacy*   Lab Work: BMET, CBC and Pro BNP when we see you back in 3 months.  If you have labs (blood work) drawn today and your tests are completely normal, you will receive your results only by: Marland Kitchen MyChart Message (if you have MyChart) OR . A  paper copy in the mail If you have any lab test that is abnormal or we need to change your treatment, we will call you to review the results.   Testing/Procedures: None   Follow-Up: At [facility], you and your health needs  are our priority.  As part of our continuing mission to provide you with exceptional heart care, we have created designated Provider Care Teams.  These Care Teams include your primary Cardiologist (physician) and Advanced Practice Providers (APPs -  Physician Assistants and Nurse Practitioners) who all work together to provide you with the care you need, when you need it.  We recommend signing up for the patient portal called "MyChart".  Sign up information is provided on this After Visit Summary.  MyChart is used to connect with patients for Virtual Visits (Telemedicine).  Patients are able to view lab/test results, encounter notes, upcoming appointments, etc.  Non-urgent messages can be sent to your provider as well.   To learn more about what you can do with MyChart, go to [email or web address].    Your next appointment:   3 month(s)  The format for your next appointment:   In Person  Provider:   You may see Lesleigh Noe, MD or one of the following Advanced Practice Providers on your designated Care Team:    Norma Fredrickson, NP  Nada Boozer, NP  Georgie Chard, NP    Other Instructions  You have been referred to Cardiac Rehab.      Signed, Lesleigh Noe, MD  06/22/2019 12:27 PM    [facility]

## 2019-06-22 ENCOUNTER — Encounter (HOSPITAL_COMMUNITY): Payer: Self-pay | Admitting: *Deleted

## 2019-06-22 ENCOUNTER — Ambulatory Visit (INDEPENDENT_AMBULATORY_CARE_PROVIDER_SITE_OTHER): Payer: 59 | Admitting: Interventional Cardiology

## 2019-06-22 ENCOUNTER — Encounter: Payer: Self-pay | Admitting: Interventional Cardiology

## 2019-06-22 ENCOUNTER — Other Ambulatory Visit: Payer: Self-pay

## 2019-06-22 VITALS — BP 124/84 | HR 103 | Ht 63.0 in | Wt 317.0 lb

## 2019-06-22 DIAGNOSIS — Z6841 Body Mass Index (BMI) 40.0 and over, adult: Secondary | ICD-10-CM

## 2019-06-22 DIAGNOSIS — I5022 Chronic systolic (congestive) heart failure: Secondary | ICD-10-CM

## 2019-06-22 DIAGNOSIS — Z7189 Other specified counseling: Secondary | ICD-10-CM

## 2019-06-22 DIAGNOSIS — I10 Essential (primary) hypertension: Secondary | ICD-10-CM

## 2019-06-22 NOTE — Patient Instructions (Signed)
Medication Instructions:  Your physician recommends that you continue on your current medications as directed. Please refer to the Current Medication list given to you today.  *If you need a refill on your cardiac medications before your next appointment, please call your pharmacy*   Lab Work: BMET, CBC and Pro BNP when we see you back in 3 months.  If you have labs (blood work) drawn today and your tests are completely normal, you will receive your results only by: Marland Kitchen MyChart Message (if you have MyChart) OR . A paper copy in the mail If you have any lab test that is abnormal or we need to change your treatment, we will call you to review the results.   Testing/Procedures: None   Follow-Up: At [facility], you and your health needs are our priority.  As part of our continuing mission to provide you with exceptional heart care, we have created designated Provider Care Teams.  These Care Teams include your primary Cardiologist (physician) and Advanced Practice Providers (APPs -  Physician Assistants and Nurse Practitioners) who all work together to provide you with the care you need, when you need it.  We recommend signing up for the patient portal called "MyChart".  Sign up information is provided on this After Visit Summary.  MyChart is used to connect with patients for Virtual Visits (Telemedicine).  Patients are able to view lab/test results, encounter notes, upcoming appointments, etc.  Non-urgent messages can be sent to your provider as well.   To learn more about what you can do with MyChart, go to [email or web address].    Your next appointment:   3 month(s)  The format for your next appointment:   In Person  Provider:   You may see Lesleigh Noe, MD or one of the following Advanced Practice Providers on your designated Care Team:    Norma Fredrickson, NP  Nada Boozer, NP  Georgie Chard, NP    Other Instructions  You have been referred to Cardiac Rehab.

## 2019-06-22 NOTE — Progress Notes (Signed)
Received referral form Dr. Katrinka Blazing  for this pt to participate in cardiac rehab with the diagnosis of chronic systolic heart failure. Clinical review of pt follow up appt on 06/22/19  with Dr. Katrinka Blazing - cardiologist office note.  Pt sees Dr. Mayford Knife for OSA.  Pt completed follow up with her PCPin 10/2018.  Pt is making the expected progress in recovery.  Pt appropriate for scheduling for on site cardiac rehab and/or enrollment in Virtual Cardiac Rehab.  Pt Covid score is 4.  Will forward to staff for follow up. Alanson Aly, BSN Cardiac and Emergency planning/management officer

## 2019-06-26 ENCOUNTER — Telehealth (HOSPITAL_COMMUNITY): Payer: Self-pay

## 2019-06-26 ENCOUNTER — Encounter (HOSPITAL_COMMUNITY): Payer: Self-pay

## 2019-06-26 NOTE — Telephone Encounter (Signed)
Pt insurance is active and benefits verified through [facility]. Co-pay $60.00, DED $0.00/$0.00 met, out of pocket $8,550.00/$1,065.09 met, co-insurance 0%. Pre-authorization is required. Liyanny/[facility], 06/25/29 @ 849AM, ZYY#[id number]  Will contact patient to see if she is interested in the Cardiac Rehab Program.

## 2019-06-26 NOTE — Telephone Encounter (Signed)
Attempted to contact pt in regards to CR, unable to leave voicemail.  Mailed letter.

## 2019-07-16 ENCOUNTER — Telehealth (HOSPITAL_COMMUNITY): Payer: Self-pay

## 2019-07-16 NOTE — Telephone Encounter (Signed)
Patient called and she was interested in participating in the Cardiac Rehab Program. Patient will come in for orientation on 08/02/2019@9 :00am and will attend the 10:45am exercise class.  Mailed homework package.

## 2019-07-18 ENCOUNTER — Telehealth: Payer: 59 | Admitting: Cardiology

## 2019-07-23 ENCOUNTER — Encounter: Payer: Self-pay | Admitting: Family Medicine

## 2019-07-24 ENCOUNTER — Other Ambulatory Visit: Payer: Self-pay

## 2019-07-24 ENCOUNTER — Encounter: Payer: Self-pay | Admitting: Family Medicine

## 2019-07-24 ENCOUNTER — Other Ambulatory Visit: Payer: Self-pay | Admitting: Family Medicine

## 2019-07-24 ENCOUNTER — Telehealth (INDEPENDENT_AMBULATORY_CARE_PROVIDER_SITE_OTHER): Payer: Self-pay | Admitting: Adult Health Nurse Practitioner

## 2019-07-24 DIAGNOSIS — M7918 Myalgia, other site: Secondary | ICD-10-CM

## 2019-07-24 DIAGNOSIS — J3089 Other allergic rhinitis: Secondary | ICD-10-CM

## 2019-07-24 DIAGNOSIS — M797 Fibromyalgia: Secondary | ICD-10-CM

## 2019-07-24 MED ORDER — METHOCARBAMOL 500 MG PO TABS: 500.0000 mg | ORAL_TABLET | Freq: Four times a day (QID) | ORAL | 0 refills | Status: DC | PRN

## 2019-07-24 MED ORDER — BUPROPION HCL ER (XL) 150 MG PO TB24: 450.0000 mg | ORAL_TABLET | Freq: Every day | ORAL | 1 refills | Status: DC

## 2019-07-24 NOTE — Patient Instructions (Signed)
° ° ° °  If you have lab work done today you will be contacted with your lab results within the next 2 weeks.  If you have not heard from us then please contact us. The fastest way to get your results is to register for My Chart. ° ° °IF you received an x-ray today, you will receive an invoice from [facility]. Please contact [facility] at [phone] with questions or concerns regarding your invoice.  ° °IF you received labwork today, you will receive an invoice from [facility]. Please contact [facility] at [phone] with questions or concerns regarding your invoice.  ° °Our billing staff will not be able to assist you with questions regarding bills from these companies. ° °You will be contacted with the lab results as soon as they are available. The fastest way to get your results is to activate your My Chart account. Instructions are located on the last page of this paperwork. If you have not heard from us regarding the results in 2 weeks, please contact this office. °  ° ° ° °

## 2019-07-24 NOTE — Telephone Encounter (Signed)
Patient is requesting a refill of the following medications: Requested Prescriptions   Pending Prescriptions Disp Refills  . ALPRAZolam (XANAX) 1 MG tablet [Pharmacy Med Name: ALPRAZOLAM 1 MG TABLET] 40 tablet 0    Sig: TAKE 1 TABLET BY MOUTH 3 TIMES DAILY AS NEEDED FOR ANXIETY.    Date of patient request: 07/24/2019 Last office visit:07/24/2019 Date of last refill: 04/10/2019 Last refill amount: 40 tablets 2 refills  Follow up time period per chart: Follow up appointment scheduled

## 2019-07-24 NOTE — Telephone Encounter (Signed)
Requested medication (s) are due for refill today: yes  Requested medication (s) are on the active medication list: yes  Last refill:  04/10/19 #40 with 2 refills  Future visit scheduled: yes  Notes to clinic:  Please review for refill. Not delegated per protocol    Requested Prescriptions  Pending Prescriptions Disp Refills   ALPRAZolam (XANAX) 1 MG tablet [Pharmacy Med Name: ALPRAZOLAM 1 MG TABLET] 40 tablet 0    Sig: TAKE 1 TABLET BY MOUTH 3 TIMES DAILY AS NEEDED FOR ANXIETY.      Not Delegated - Psychiatry:  Anxiolytics/Hypnotics Failed - 07/24/2019  1:49 PM      Failed - This refill cannot be delegated      Failed - Urine Drug Screen completed in last 360 days.      Passed - Valid encounter within last 6 months    Recent Outpatient Visits           Today Myofascial pain   Primary Care at [facility], Lonna Cobb, NP   3 months ago Depression with anxiety   Primary Care at [facility], Meda Coffee, MD   8 months ago CHF (congestive heart failure), NYHA class II, acute, systolic [facility])   [facility] at [facility], Meda Coffee, MD   9 months ago CHF (congestive heart failure), NYHA class II, acute, systolic [facility])   [facility] at [facility], Meda Coffee, MD   9 months ago Snoring   Primary Care at [facility], Minerva Fester, MD       Future Appointments             In 2 weeks Myles Lipps, MD Primary Care at [place], [place]   In 1 month Lyn Records, MD [facility], [id number]

## 2019-07-24 NOTE — Progress Notes (Signed)
Telemedicine Encounter- SOAP NOTE Established Patient  This telephone encounter was conducted with the patient's (or proxy's) verbal consent via audio telecommunications: yes/no: Yes Patient was instructed to have this encounter in a suitably private space; and to only have persons present to whom they give permission to participate. In addition, patient identity was confirmed by use of name plus two identifiers (DOB and address).  I discussed the limitations, risks, security and privacy concerns of performing an evaluation and management service by telephone and the availability of in person appointments. I also discussed with the patient that there may be a patient responsible charge related to this service. The patient expressed understanding and agreed to proceed.  I spent a total of TIME; 0 MIN TO 60 MIN: 18 talking with the patient or their proxy.  Chief Complaint  Patient presents with  . Shoulder Pain    pt used a percussion massager on saturday and fell asleep with it on her right shoulder and now is having extreme pain and having trouble lifting  . Shortness of Breath    pt complaining of some SOB and congestion started a few days ago belives it is allergies     Subjective   Samantha Romero is a 44 y.o. established patient. Telephone visit today for medication refills and congestion, as well as muscle spasm.   HPI   1.  Patient reports hx of CHF and seasonal allergies.  In order to determine whether congestion was CHF or allergies, patient was instructed to increase furosemide to 40mg  daily.  She did and fluid improved but still had congestion.  Takes Flonase, Singulair, Xopenex, Prilosec, Zyrtec, Astelin for allergies.  Discussed that there was not more I would recommend other than OTC saline spray or Neti Pots.   2.  Muscle spasm to posterior right shoulder, trapezius.  Requests Hydrocodone as she took 26 of her old Hydrocodone and it was effective for pain.  Stated she bought  a sling for right arm as dangling makes it heavy and hurt.  Explained that we do not prescribe opiods for muscle spasms.  Offered medication for muscle spasm with instructions.    3.  Requests Wellbutrin 450mg  which she states she is supposed to be on but pharmacy never received prescription.  Refilled per chart.  Requested Xanax and I advised patient I would let Dr. Pamella Pert know but would not be able to prescribe today for a visit for allergies.     Patient Active Problem List   Diagnosis Date Noted  . Asthma 10/06/2018  . Hypertensive urgency 10/06/2018  . GERD (gastroesophageal reflux disease) 10/06/2018  . Depression with anxiety 10/06/2018  . CHF (congestive heart failure), NYHA class II, acute, systolic ([place]) 10/62/6948  . Shortness of breath   . Cardiomegaly   . Snoring 10/04/2018  . Essential hypertension 08/14/2018  . Moderate persistent asthma with acute exacerbation 08/14/2018  . Fibromyalgia 11/18/2016  . Class 3 severe obesity due to excess calories without serious comorbidity with body mass index (BMI) of 45.0 to 49.9 in adult ([place]) 11/18/2016  . Adjustment reaction with anxiety and depression 11/18/2016  . Chronic bilateral low back pain without sciatica 09/22/2015  . Myofascial pain 09/22/2015  . Lapband port leaking-REVISED May 2013 09/10/2011  . 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 Anxiety    panic attacks  . Asthma   . CHF (congestive heart failure), NYHA class II, acute, systolic ([place]) 5/46/2703  .  Depression   . Fibromyalgia   . Hypertension    no meds currently  . No pertinent past medical history   . Pneumonia    hx of   . Sinus problem     Current Outpatient Medications  Medication Sig Dispense Refill  . albuterol (PROAIR HFA) 108 (90 Base) MCG/ACT inhaler Inhale 2 puffs into the lungs every 6 (six) hours as needed for wheezing or shortness of breath.     Marland Kitchen albuterol (PROVENTIL) (2.5 MG/3ML) 0.083%  nebulizer solution Take 3 mLs (2.5 mg total) by nebulization every 6 (six) hours as needed for wheezing or shortness of breath. 150 mL 1  . ALPRAZolam (XANAX) 1 MG tablet Take 1 tablet (1 mg total) by mouth 3 (three) times daily as needed for anxiety. 40 tablet 2  . azelastine (ASTELIN) 0.1 % nasal spray Place 2 sprays into both nostrils 2 (two) times daily. 30 mL 5  . buPROPion 450 MG TB24 Take 450 mg by mouth daily. 90 tablet 1  . carvedilol (COREG) 25 MG tablet Take 1 tablet (25 mg total) by mouth 2 (two) times daily with a meal. 180 tablet 3  . cetirizine (ZYRTEC) 10 MG tablet Take 10 mg by mouth daily with breakfast.    . DULoxetine (CYMBALTA) 60 MG capsule Take 1 capsule (60 mg total) by mouth daily. 90 capsule 1  . fluticasone (FLONASE) 50 MCG/ACT nasal spray Place 1 spray into both nostrils every morning.    . furosemide (LASIX) 40 MG tablet Take 1 tablet (40 mg total) by mouth 2 (two) times daily. 180 tablet 3  . levalbuterol (XOPENEX HFA) 45 MCG/ACT inhaler Inhale 2 puffs into the lungs every 6 (six) hours as needed for wheezing. 1 Inhaler 1  . levalbuterol (XOPENEX) 1.25 MG/3ML nebulizer solution Take 1.25 mg by nebulization every 6 (six) hours as needed for wheezing. 72 mL 5  . mometasone-formoterol (DULERA) 200-5 MCG/ACT AERO Inhale 2 puffs into the lungs 2 (two) times a day. 13 g 5  . montelukast (SINGULAIR) 10 MG tablet TAKE 1 TABLET BY MOUTH EVERYDAY AT BEDTIME 90 tablet 1  . omeprazole (PRILOSEC) 40 MG capsule TAKE 1 CAPSULE BY MOUTH EVERY DAY 90 capsule 1  . sacubitril-valsartan (ENTRESTO) 97-103 MG Take 1 tablet by mouth 2 (two) times daily. 60 tablet 6  . spironolactone (ALDACTONE) 25 MG tablet Take 1 tablet (25 mg total) by mouth daily. 90 tablet 3   No current facility-administered medications for this visit.    Allergies  Allergen Reactions  . Grass Extracts [Gramineae Pollens]     Nasal congestion, post nasal drip   . Shellfish Allergy Itching    In throat only.     Social History   Socioeconomic History  . Marital status: Single    Spouse name: Not on file  . Number of children: Not on file  . Years of education: Not on file  . Highest education level: Not on file  Occupational History  . Not on file  Tobacco Use  . Smoking status: Never Smoker  . Smokeless tobacco: Never Used  Substance and Sexual Activity  . Alcohol use: Yes    Comment: socially  . Drug use: Never  . Sexual activity: Not Currently    Birth control/protection: I.U.D.    Comment: Mirena  Other Topics Concern  . Not on file  Social History Narrative  . Not on file   Social Determinants of Health   Financial Resource Strain:   . Difficulty  of Paying Living Expenses:   Food Insecurity:   . Worried About Programme researcher, broadcasting/film/video in the Last Year:   . Barista in the Last Year:   Transportation Needs:   . Freight forwarder (Medical):   Marland Kitchen Lack of Transportation (Non-Medical):   Physical Activity:   . Days of Exercise per Week:   . Minutes of Exercise per Session:   Stress:   . Feeling of Stress :   Social Connections:   . Frequency of Communication with Friends and Family:   . Frequency of Social Gatherings with Friends and Family:   . Attends Religious Services:   . Active Member of Clubs or Organizations:   . Attends Banker Meetings:   Marland Kitchen Marital Status:   Intimate Partner Violence:   . Fear of Current or Ex-Partner:   . Emotionally Abused:   Marland Kitchen Physically Abused:   . Sexually Abused:     ROS   Review of Systems See HPI Constitution: No fevers or chills No malaise No diaphoresis Skin: No rash or itching Eyes: no blurry vision, no double vision GU: no dysuria or hematuria Neuro: no dizziness or headaches       Objective     GEN: WDWN, NAD, Non-toxic, Alert & Oriented x 3  PSYCH: Normally interactive. Conversant. Not depressed or anxious appearing.  Calm demeanor.    Vitals as reported by the patient: There were  no vitals filed for this visit.  Atara was seen today for shoulder pain and shortness of breath.  Diagnoses and all orders for this visit:  Myofascial pain  Fibromyalgia  Environmental and seasonal allergies  Other orders -     methocarbamol (ROBAXIN) 500 MG tablet; Take 1 tablet (500 mg total) by mouth every 6 (six) hours as needed for muscle spasms.   Meds ordered this encounter  Medications  . methocarbamol (ROBAXIN) 500 MG tablet    Sig: Take 1 tablet (500 mg total) by mouth every 6 (six) hours as needed for muscle spasms.    Dispense:  30 tablet    Refill:  0      I discussed the assessment and treatment plan with the patient. The patient was provided an opportunity to ask questions and all were answered. The patient agreed with the plan and demonstrated an understanding of the instructions.   The patient was advised to call back or seek an in-person evaluation if the symptoms worsen or if the condition fails to improve as anticipated.   I provided of non-face-to-face time during this encounter.  Elyse Jarvis, NP  Primary Care at [facility]

## 2019-07-24 NOTE — Telephone Encounter (Signed)
pmp not working meds refilled

## 2019-07-26 ENCOUNTER — Telehealth: Payer: Medicaid Other | Admitting: Family Medicine

## 2019-07-27 ENCOUNTER — Telehealth (HOSPITAL_COMMUNITY): Payer: Self-pay | Admitting: Pharmacist

## 2019-07-27 NOTE — Telephone Encounter (Signed)
Cardiac Rehab Medication Review by a Pharmacist  Does the patient feel that his/her medications are working for him/her?  yes  Has the patient been experiencing any side effects to the medications prescribed?  no  Does the patient measure his/her own blood pressure or blood glucose at home?  yes  Average SBP 135-145 mmHg  Does the patient have any problems obtaining medications due to transportation or finances?   no  Understanding of regimen: excellent Understanding of indications: excellent Potential of compliance: excellent  Pharmacist comments: N/A  Fabio Neighbors, PharmD PGY1 Ambulatory Care Resident [facility] # [phone]

## 2019-07-31 ENCOUNTER — Encounter: Payer: Self-pay | Admitting: Family Medicine

## 2019-07-31 DIAGNOSIS — F418 Other specified anxiety disorders: Secondary | ICD-10-CM

## 2019-07-31 MED ORDER — DULOXETINE HCL 60 MG PO CPEP: 60.0000 mg | ORAL_CAPSULE | Freq: Every day | ORAL | 1 refills | Status: DC

## 2019-08-01 ENCOUNTER — Telehealth: Payer: Self-pay | Admitting: Interventional Cardiology

## 2019-08-01 ENCOUNTER — Other Ambulatory Visit: Payer: Self-pay

## 2019-08-01 ENCOUNTER — Encounter (HOSPITAL_COMMUNITY)
Admission: RE | Admit: 2019-08-01 | Discharge: 2019-08-01 | Disposition: A | Discharge status: Home / Self Care | Payer: Self-pay | Source: Ambulatory Visit | Attending: Interventional Cardiology | Admitting: Interventional Cardiology

## 2019-08-01 ENCOUNTER — Telehealth (HOSPITAL_COMMUNITY): Payer: Self-pay | Admitting: *Deleted

## 2019-08-01 NOTE — Telephone Encounter (Signed)
Samantha Romero from Cardiac Rehab called and states that the patient is to start rehab and had mentioned having a sleep study done in February. She stated to Samantha Romero that she was to receive a cpap machine but had not heard anything else about this. Samantha Romero decided to reach out to [place] to see if a nurse could reach out to the patient in regards to this. Please contact patient at [phone]

## 2019-08-01 NOTE — Telephone Encounter (Signed)
Call to patient to explain Dr Mayford Knife is recommending CPAP titration study. Explained the process for obtaining prior authorization.  Paperwork faxed to [facility] today.  Pt verbalized understanding.

## 2019-08-01 NOTE — Telephone Encounter (Signed)
Spoke with Deirdre completed health history. Confirmed orientation appointment for 08/02/19. Gladstone Lighter, RN,BSN 08/01/2019 1:58 PM

## 2019-08-02 ENCOUNTER — Telehealth (HOSPITAL_COMMUNITY): Payer: Self-pay

## 2019-08-02 ENCOUNTER — Ambulatory Visit (HOSPITAL_COMMUNITY): Payer: 59

## 2019-08-02 ENCOUNTER — Telehealth (HOSPITAL_COMMUNITY): Payer: Self-pay | Admitting: *Deleted

## 2019-08-02 NOTE — Telephone Encounter (Signed)
Spoke with Deirdre. Deirdre says she is working on obtaining an in person office visit to evaluate her arm.Gladstone Lighter, RN,BSN 08/02/2019 12:39 PM

## 2019-08-02 NOTE — Telephone Encounter (Signed)
Per Byrd Hesselbach RN canceled pt cardiac rehab appointments.
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
## 2019-08-03 NOTE — Telephone Encounter (Signed)
Per [facility] in lab titration study has been approved auth # [id number], dates of service May 5th thru August 3.  Will forward to Veda Canning to schedule inlab study.

## 2019-08-06 ENCOUNTER — Telehealth: Payer: Self-pay | Admitting: *Deleted

## 2019-08-06 ENCOUNTER — Ambulatory Visit (HOSPITAL_COMMUNITY): Payer: 59

## 2019-08-06 DIAGNOSIS — I10 Essential (primary) hypertension: Secondary | ICD-10-CM

## 2019-08-06 DIAGNOSIS — R0602 Shortness of breath: Secondary | ICD-10-CM

## 2019-08-06 NOTE — Telephone Encounter (Signed)
-----   Message from Patricia Pesa, RN sent at 08/03/2019  1:43 PM EDT ----- Regarding: RE: precert Per [facility] in lab titration study has been approved auth # [id number], dates of service May 5th thru August 3.  Will forward to Veda Canning to schedule inlab study.    ----- Message ----- From: Reesa Chew, CMA Sent: 07/05/2019   9:57 AM EDT To: [facility] Sleep Studies Subject: precert                                        CPAP titration in lab ASAP

## 2019-08-06 NOTE — Telephone Encounter (Addendum)
Patient is scheduled for CPAP Titration on 08/19/19. pt is scheduled for COVID screening on 08/17/19 12:15 prior to titration.  Patient understands his titration study will be done at [facility] sleep lab. Patient understands he will receive a letter in a week or so detailing appointment, date, time, and location. Patient understands to call if he does not receive the letter  in a timely manner.  Left detailed message on voicemail with date and time of titration and informed patient to call back to confirm or reschedule.

## 2019-08-07 ENCOUNTER — Ambulatory Visit: Payer: Medicaid Other | Admitting: Family Medicine

## 2019-08-08 ENCOUNTER — Ambulatory Visit (HOSPITAL_COMMUNITY): Payer: 59

## 2019-08-09 NOTE — Telephone Encounter (Signed)
Patient is scheduled for CPAP Titration on 08/19/19. pt is scheduled for COVID screening on 08/17/19 12:15 prior to titration.  Patient understands his titration study will be done at [facility] sleep lab. Patient understands he will receive a letter in a week or so detailing appointment, date, time, and location. Patient understands to call if he does not receive the letter  in a timely manner.  Left detailed message on voicemail with date and time of titration and informed patient to call back to confirm or reschedule.  

## 2019-08-10 ENCOUNTER — Ambulatory Visit (HOSPITAL_COMMUNITY): Payer: 59

## 2019-08-10 ENCOUNTER — Encounter (HOSPITAL_COMMUNITY): Payer: Self-pay

## 2019-08-13 ENCOUNTER — Ambulatory Visit (HOSPITAL_COMMUNITY): Payer: 59

## 2019-08-14 ENCOUNTER — Ambulatory Visit: Payer: Medicaid Other | Admitting: Family Medicine

## 2019-08-15 ENCOUNTER — Ambulatory Visit (HOSPITAL_COMMUNITY): Payer: 59

## 2019-08-17 ENCOUNTER — Encounter: Payer: Self-pay | Admitting: Family Medicine

## 2019-08-17 ENCOUNTER — Other Ambulatory Visit: Payer: Self-pay

## 2019-08-17 ENCOUNTER — Ambulatory Visit (INDEPENDENT_AMBULATORY_CARE_PROVIDER_SITE_OTHER): Payer: Self-pay | Admitting: Family Medicine

## 2019-08-17 ENCOUNTER — Ambulatory Visit (HOSPITAL_COMMUNITY): Payer: 59

## 2019-08-17 ENCOUNTER — Other Ambulatory Visit (HOSPITAL_COMMUNITY)
Admission: RE | Admit: 2019-08-17 | Discharge: 2019-08-17 | Disposition: A | Discharge status: Home / Self Care | Payer: 59 | Source: Ambulatory Visit | Attending: Cardiology | Admitting: Cardiology

## 2019-08-17 VITALS — BP 136/96 | HR 104 | Temp 97.3°F | Ht 63.0 in | Wt 320.4 lb

## 2019-08-17 DIAGNOSIS — F418 Other specified anxiety disorders: Secondary | ICD-10-CM

## 2019-08-17 DIAGNOSIS — Z20822 Contact with and (suspected) exposure to covid-19: Secondary | ICD-10-CM | POA: Diagnosis not present

## 2019-08-17 DIAGNOSIS — I10 Essential (primary) hypertension: Secondary | ICD-10-CM

## 2019-08-17 DIAGNOSIS — Z01812 Encounter for preprocedural laboratory examination: Secondary | ICD-10-CM | POA: Diagnosis present

## 2019-08-17 DIAGNOSIS — Z23 Encounter for immunization: Secondary | ICD-10-CM

## 2019-08-17 DIAGNOSIS — I5021 Acute systolic (congestive) heart failure: Secondary | ICD-10-CM

## 2019-08-17 DIAGNOSIS — M797 Fibromyalgia: Secondary | ICD-10-CM

## 2019-08-17 LAB — HEMOGLOBIN A1C
Est. average glucose Bld gHb Est-mCnc: 134 mg/dL
Hgb A1c MFr Bld: 6.3 % — ABNORMAL HIGH (ref 4.8–5.6)

## 2019-08-17 LAB — SARS CORONAVIRUS 2 (TAT 6-24 HRS): SARS Coronavirus 2: NEGATIVE

## 2019-08-17 LAB — LIPID PANEL
Chol/HDL Ratio: 2.9 ratio (ref 0.0–4.4)
Cholesterol, Total: 154 mg/dL (ref 100–199)
HDL: 53 mg/dL (ref >39)
LDL Chol Calc (NIH): 82 mg/dL (ref 0–99)
Triglycerides: 105 mg/dL (ref 0–149)
VLDL Cholesterol Cal: 19 mg/dL (ref 5–40)

## 2019-08-17 LAB — TSH: TSH: 1.23 u[IU]/mL (ref 0.450–4.500)

## 2019-08-17 MED ORDER — ALPRAZOLAM 1 MG PO TABS: ORAL_TABLET | ORAL | 0 refills | Status: DC

## 2019-08-17 MED ORDER — DULOXETINE HCL 30 MG PO CPEP: 30.0000 mg | ORAL_CAPSULE | Freq: Every day | ORAL | 1 refills | Status: DC

## 2019-08-17 MED ORDER — OMEPRAZOLE 40 MG PO CPDR: DELAYED_RELEASE_CAPSULE | ORAL | 1 refills | Status: DC

## 2019-08-17 MED ORDER — DULOXETINE HCL 60 MG PO CPEP: 60.0000 mg | ORAL_CAPSULE | Freq: Every day | ORAL | 1 refills | Status: DC

## 2019-08-17 MED ORDER — METHOCARBAMOL 500 MG PO TABS: 500.0000 mg | ORAL_TABLET | Freq: Four times a day (QID) | ORAL | 3 refills | Status: AC | PRN

## 2019-08-17 NOTE — Progress Notes (Signed)
5/21/202110:13 AM  Samantha Romero 1975-10-08, 44 y.o., female [id number]  Chief Complaint  Patient presents with  . Medication Refill    wants to talk the use of cymbalta, does not feel it is making a difference  . Sleeping Problem    has sleep study on Sunday, still having trouble sleeping  . Fibromyalgia    asking for another  medication for the pain the tinazadine is not working    HPI:   Patient is a 44 y.o. female with past medical history significant for HTNwith CHF, asthma, GERD, depression, anxiety, gastric lap band, fibromyalgia,who presents today forroutine followup  Last OV jan 2021 - increased wellbutrin Danna Hefty NP April 2021 - trial of methocarbamol scheduled for cpap titration study in 2 days, thru cards and sleep Has not seen asthma specialist  She just started wellbutrin 450mg  past 2 weeks due to pharmacy issues, denies any side effects Starting to notice some benefits  fibromylagia "Has been ok" Feels somewhat achy once in a while Having some brain fog which might be more related to sleep issues, averages about 3 hours of sleep a night Methocarbamol was given due to specific hurting of her hand Used to be on tizanadine in the past - which did not really help Lost her rx for cymbalta on day she picked it up, has gone thru some withdrawals   Sees cards on June 18th, with labs days prior She is starting cardiac rehab  Needs refills of xanax which she takes prn, pmp reviewed  Having left sided low back pain that limits her ability to exercise Denies any radiating pain down leg, changes to bowel or bladder, any focal weakness  Lab Results  Component Value Date   CREATININE 1.01 (H) 06/06/2019   BUN 12 06/06/2019   NA 139 06/06/2019   K 4.9 06/06/2019   CL 104 06/06/2019   CO2 22 06/06/2019    Depression screen [facility] 2/9 07/24/2019 04/10/2019 11/14/2018  Decreased Interest 0 0 2  Down, Depressed, Hopeless 0 0 1  PHQ - 2 Score 0 0 3  Altered  sleeping - - 3  Tired, decreased energy - - 3  Change in appetite - - 3  Feeling bad or failure about yourself  - - 1  Trouble concentrating - - -  Moving slowly or fidgety/restless - - 2  Suicidal thoughts - - 0  PHQ-9 Score - - 15  Difficult doing work/chores - - Extremely dIfficult    Fall Risk  08/17/2019 07/24/2019 04/10/2019 11/14/2018 10/10/2018  Falls in the past year? 0 0 0 0 0  Number falls in past yr: 0 - 0 0 0  Injury with Fall? 0 - 0 0 0  Follow up - Falls evaluation completed - - -     Allergies  Allergen Reactions  . Grass Extracts [Gramineae Pollens]     Nasal congestion, post nasal drip   . Shellfish Allergy Itching    In throat only.    Prior to Admission medications   Medication Sig Start Date End Date Taking? Authorizing Provider  albuterol (PROAIR HFA) 108 (90 Base) MCG/ACT inhaler Inhale 2 puffs into the lungs every 6 (six) hours as needed for wheezing or shortness of breath.    Yes [provider]  albuterol (PROVENTIL) (2.5 MG/3ML) 0.083% nebulizer solution Take 3 mLs (2.5 mg total) by nebulization every 6 (six) hours as needed for wheezing or shortness of breath. 08/23/18  Yes Rutherford Guys, MD  ALPRAZolam (XANAX) 1 MG tablet TAKE 1 TABLET BY MOUTH 3 TIMES DAILY AS NEEDED FOR ANXIETY. 07/24/19  Yes Myles Lipps, MD  azelastine (ASTELIN) 0.1 % nasal spray Place 2 sprays into both nostrils 2 (two) times daily. 10/19/18  Yes Marcelyn Bruins, MD  buPROPion (WELLBUTRIN XL) 150 MG 24 hr tablet Take 3 tablets (450 mg total) by mouth daily. 07/24/19 08/23/19 Yes Royal Hawthorn, NP  carvedilol (COREG) 25 MG tablet Take 1 tablet (25 mg total) by mouth 2 (two) times daily with a meal. 05/07/19  Yes Rosalio Macadamia, NP  cetirizine (ZYRTEC) 10 MG tablet Take 10 mg by mouth daily with breakfast.   Yes [provider]  DULoxetine (CYMBALTA) 60 MG capsule Take 1 capsule (60 mg total) by mouth daily. 07/31/19  Yes Myles Lipps, MD    fluticasone [facility]) 50 MCG/ACT nasal spray Place 1 spray into both nostrils every morning.   Yes [provider]  furosemide (LASIX) 40 MG tablet Take 1 tablet (40 mg total) by mouth 2 (two) times daily. 06/06/19  Yes Georgie Chard D, NP  levalbuterol [facility] HFA) 45 MCG/ACT inhaler Inhale 2 puffs into the lungs every 6 (six) hours as needed for wheezing. 10/19/18  Yes Padgett, Pilar Grammes, MD  levalbuterol [facility]) 1.25 MG/3ML nebulizer solution Take 1.25 mg by nebulization every 6 (six) hours as needed for wheezing. 10/19/18  Yes Padgett, Pilar Grammes, MD  methocarbamol (ROBAXIN) 500 MG tablet Take 1 tablet (500 mg total) by mouth every 6 (six) hours as needed for muscle spasms. 07/24/19 08/23/19 Yes Royal Hawthorn, NP  mometasone-formoterol (DULERA) 200-5 MCG/ACT AERO Inhale 2 puffs into the lungs 2 (two) times a day. 10/19/18  Yes Padgett, Pilar Grammes, MD  montelukast (SINGULAIR) 10 MG tablet TAKE 1 TABLET BY MOUTH EVERYDAY AT BEDTIME 02/08/19  Yes Myles Lipps, MD  omeprazole (PRILOSEC) 40 MG capsule TAKE 1 CAPSULE BY MOUTH EVERY DAY 04/10/19  Yes Myles Lipps, MD  sacubitril-valsartan (ENTRESTO) 97-103 MG Take 1 tablet by mouth 2 (two) times daily. 06/06/19  Yes Georgie Chard D, NP  spironolactone (ALDACTONE) 25 MG tablet Take 1 tablet (25 mg total) by mouth daily. 06/06/19 09/04/19 Yes Filbert Schilder, NP    Past Medical History:  Diagnosis Date  . Anxiety    panic attacks  . Asthma   . CHF (congestive heart failure), NYHA class II, acute, systolic (HCC) 10/06/2018  . Depression   . Fibromyalgia   . Hypertension    no meds currently  . No pertinent past medical history   . Pneumonia    hx of   . Sinus problem     Past Surgical History:  Procedure Laterality Date  . ESOPHAGOGASTRODUODENOSCOPY N/A 11/26/2014   Procedure: ESOPHAGOGASTRODUODENOSCOPY (EGD);  Surgeon: Ovidio Kin, MD;  Location: [facility];  Service: General;  Laterality: N/A;  .  GASTRIC BANDING PORT REVISION  08/24/11  . LAPAROSCOPIC GASTRIC BANDING  12/26/2007  . LAPAROSCOPIC LYSIS OF ADHESIONS N/A 01/06/2015   Procedure: LAPAROSCOPIC LYSIS OF ADHESIONS;  Surgeon: Luretha Murphy, MD;  Location: [facility];  Service: General;  Laterality: N/A;  . LAPAROSCOPIC REPAIR AND REMOVAL OF GASTRIC BAND N/A 01/06/2015   Procedure: LAPAROSCOPIC RESIGHTING OF GASTRIC Band PORT;  Surgeon: Luretha Murphy, MD;  Location: [facility];  Service: General;  Laterality: N/A;  . NO PAST SURGERIES      Social History   Tobacco Use  . Smoking status: Never Smoker  . Smokeless tobacco: Never  Used  Substance Use Topics  . Alcohol use: Yes    Comment: socially    Family History  Problem Relation Age of Onset  . Cancer Maternal Uncle        lung  . Cancer Maternal Grandfather        lung and back    Review of Systems  Constitutional: Negative for chills and fever.  Respiratory: Negative for cough and shortness of breath.   Cardiovascular: Negative for chest pain, palpitations and leg swelling.  Gastrointestinal: Negative for abdominal pain, nausea and vomiting.   Per hpi  OBJECTIVE:  Today's Vitals   08/17/19 0950  BP: (!) 136/96  Pulse: (!) 104  Temp: (!) 97.3 F (36.3 C)  SpO2: 97%  Weight: (!) 320 lb 6.4 oz (145.3 kg)  Height: 5\' 3"  (1.6 m)   Body mass index is 56.76 kg/m.   Physical Exam Vitals and nursing note reviewed.  Constitutional:      Appearance: She is well-developed.  HENT:     Head: Normocephalic and atraumatic.     Mouth/Throat:     Pharynx: No oropharyngeal exudate.  Eyes:     General: No scleral icterus.    Conjunctiva/sclera: Conjunctivae normal.     Pupils: Pupils are equal, round, and reactive to light.  Cardiovascular:     Rate and Rhythm: Normal rate and regular rhythm.     Heart sounds: Normal heart sounds. No murmur. No friction rub. No gallop.   Pulmonary:     Effort: Pulmonary effort is normal.     Breath sounds: Normal breath sounds.  No wheezing, rhonchi or rales.  Musculoskeletal:     Cervical back: Normal and neck supple.     Thoracic back: Normal.     Lumbar back: Tenderness (left paraspinals) present. No spasms or bony tenderness. Negative right straight leg raise test and negative left straight leg raise test.     Right hip: Normal.     Left hip: Normal.     Right lower leg: No edema.     Left lower leg: No edema.  Skin:    General: Skin is warm and dry.  Neurological:     Mental Status: She is alert and oriented to person, place, and time.     Gait: Gait normal.     Deep Tendon Reflexes: Reflexes normal.     No results found for this or any previous visit (from the past 24 hour(s)).  No results found.   ASSESSMENT and PLAN  1. Fibromyalgia Stable. Discussed she has been doing well on duloxetine. Restart, reviewed r/se/b. Methocarbamol prn myalgias, muscle spasms.   2. Depression with anxiety Stable. Recently started higher dose wellbutrin. Restart duloxetine. pmp reviewed. Refilled xanax - DULoxetine (CYMBALTA) 60 MG capsule; Take 1 capsule (60 mg total) by mouth daily.  3. CHF (congestive heart failure), NYHA class II, acute, systolic (HCC) Managed by cards. Has upcoming cpap titration  4. Need for vaccination - Td vaccine greater than or equal to 7yo preservative free IM  5. Essential hypertension Slightly above goal. Managed by cards. Has upcoming cpap titration. Working with cardiac rehab.  - Lipid panel - Hemoglobin A1c - TSH  Other orders - ALPRAZolam (XANAX) 1 MG tablet; TAKE 1 TABLET BY MOUTH 3 TIMES DAILY AS NEEDED FOR ANXIETY. - omeprazole (PRILOSEC) 40 MG capsule; TAKE 1 CAPSULE BY MOUTH EVERY DAY - methocarbamol (ROBAXIN) 500 MG tablet; Take 1 tablet (500 mg total) by mouth every 6 (six) hours as needed  for muscle spasms.  Return in about 3 months (around 11/17/2019).    Myles Lipps, MD Primary Care at [facility] [address] [place], [place] [address] Ph.  [phone] Fax  [phone]

## 2019-08-17 NOTE — Patient Instructions (Signed)
° ° ° °  If you have lab work done today you will be contacted with your lab results within the next 2 weeks.  If you have not heard from us then please contact us. The fastest way to get your results is to register for My Chart. ° ° °IF you received an x-ray today, you will receive an invoice from [facility]. Please contact  Radiology at [phone] with questions or concerns regarding your invoice.  ° °IF you received labwork today, you will receive an invoice from [facility]. Please contact [facility] at [phone] with questions or concerns regarding your invoice.  ° °Our billing staff will not be able to assist you with questions regarding bills from these companies. ° °You will be contacted with the lab results as soon as they are available. The fastest way to get your results is to activate your My Chart account. Instructions are located on the last page of this paperwork. If you have not heard from us regarding the results in 2 weeks, please contact this office. °  ° ° ° °

## 2019-08-19 ENCOUNTER — Ambulatory Visit (HOSPITAL_BASED_OUTPATIENT_CLINIC_OR_DEPARTMENT_OTHER): Payer: 59 | Admitting: Cardiology

## 2019-08-20 ENCOUNTER — Other Ambulatory Visit: Payer: Self-pay

## 2019-08-20 ENCOUNTER — Ambulatory Visit (HOSPITAL_COMMUNITY): Payer: 59

## 2019-08-20 ENCOUNTER — Ambulatory Visit (HOSPITAL_BASED_OUTPATIENT_CLINIC_OR_DEPARTMENT_OTHER): Payer: 59 | Attending: Cardiology | Admitting: Cardiology

## 2019-08-20 VITALS — Temp 96.9°F | Ht 62.0 in | Wt 320.0 lb

## 2019-08-20 DIAGNOSIS — R0602 Shortness of breath: Secondary | ICD-10-CM | POA: Diagnosis present

## 2019-08-20 DIAGNOSIS — Z79899 Other long term (current) drug therapy: Secondary | ICD-10-CM | POA: Diagnosis not present

## 2019-08-20 DIAGNOSIS — I10 Essential (primary) hypertension: Secondary | ICD-10-CM | POA: Diagnosis not present

## 2019-08-20 DIAGNOSIS — G4733 Obstructive sleep apnea (adult) (pediatric): Secondary | ICD-10-CM | POA: Insufficient documentation

## 2019-08-21 NOTE — Procedures (Signed)
   Patient Name: Samantha Romero, Rund Date: 08/20/2019 Gender: Female D.O.B: 1975/10/13 Age (years): 17 Referring Provider: Armanda Magic MD, ABSM Height (inches): 62 Interpreting Physician: Armanda Magic MD, ABSM Weight (lbs): 320 RPSGT: Armen Pickup BMI: 59 MRN: [id number] Neck Size: 18.00  CLINICAL INFORMATION The patient is referred for a CPAP titration to treat sleep apnea.  SLEEP STUDY TECHNIQUE As per the AASM Manual for the Scoring of Sleep and Associated Events v2.3 (April 2016) with a hypopnea requiring 4% desaturations.  The channels recorded and monitored were frontal, central and occipital EEG, electrooculogram (EOG), submentalis EMG (chin), nasal and oral airflow, thoracic and abdominal wall motion, anterior tibialis EMG, snore microphone, electrocardiogram, and pulse oximetry. Continuous positive airway pressure (CPAP) was initiated at the beginning of the study and titrated to treat sleep-disordered breathing.  MEDICATIONS Medications self-administered by patient taken the night of the study : CARVEDILOL, PRILOSEC, LASIX, SPIRONOLACTONE, Entresto, SINGULAIR, XANAX  TECHNICIAN COMMENTS Comments added by technician: NO REST ROOM VISTED Comments added by scorer: N/A  RESPIRATORY PARAMETERS Optimal PAP Pressure (cm): 9  AHI at Optimal Pressure (/hr):0.0 Overall Minimal O2 (%):77.0  Supine % at Optimal Pressure (%):100 Minimal O2 at Optimal Pressure (%): 85.0   SLEEP ARCHITECTURE The study was initiated at 9:57:13 PM and ended at 4:31:51 AM.  Sleep onset time was 52.5 minutes and the sleep efficiency was 83.7%. The total sleep time was 330.5 minutes.  The patient spent 4.1% of the night in stage N1 sleep, 59.5% in stage N2 sleep, 0.6% in stage N3 and 35.9% in REM.Stage REM latency was 82.5 minutes  Wake after sleep onset was 11.7. Alpha intrusion was absent. Supine sleep was 35.76%.  CARDIAC DATA The 2 lead EKG demonstrated sinus rhythm. The mean heart rate was  81.5 beats per minute. Other EKG findings include: None.  LEG MOVEMENT DATA The total Periodic Limb Movements of Sleep (PLMS) were 0. The PLMS index was 0.0. A PLMS index of <15 is considered normal in adults.  IMPRESSIONS - The optimal PAP pressure was 9 cm of water. - Central sleep apnea was not noted during this titration (CAI = 0.2/h). - Severe oxygen desaturations were observed during this titration (min O2 = 77.0%). - No snoring was audible during this study. - No cardiac abnormalities were observed during this study. - Clinically significant periodic limb movements were not noted during this study. Arousals associated with PLMs were rare.  DIAGNOSIS - Obstructive Sleep Apnea (327.23 [G47.33 ICD-10])  RECOMMENDATIONS - Trial of CPAP therapy on 9 cm H2O with a Medium size Resmed Nasal Mask Airfit N20 mask and heated humidification. - Avoid alcohol, sedatives and other CNS depressants that may worsen sleep apnea and disrupt normal sleep architecture. - Sleep hygiene should be reviewed to assess factors that may improve sleep quality. - Weight management and regular exercise should be initiated or continued. - Return to Sleep Center for re-evaluation after 8 weeks of therapy  [Electronically signed] 08/21/2019 11:08 AM  Armanda Magic MD, ABSM Diplomate, American Board of Sleep Medicine

## 2019-08-22 ENCOUNTER — Ambulatory Visit (HOSPITAL_COMMUNITY): Payer: 59

## 2019-08-23 ENCOUNTER — Telehealth: Payer: Self-pay | Admitting: *Deleted

## 2019-08-23 NOTE — Telephone Encounter (Addendum)
Informed patient of sleep study results and patient understanding was verbalized. Patient understands her sleep study showed they had a successful PAP titration and let DME know that orders are in EPIC. Please set up 10 week OV with me.    DME selection is [facility]. Patient understands she will be contacted by [facility] to set up her cpap. Patient understands to call if she does not contact her with new setup in a timely manner. Patient understands they will be called once confirmation has been received from [facility] that they have received their new machine to schedule 10 week follow up appointment.  [facility] notified of new cpap order  Please add to airview Patient was grateful for the call and thanked me.

## 2019-08-23 NOTE — Telephone Encounter (Signed)
-----   Message from Quintella Reichert, MD sent at 08/21/2019 11:10 AM EDT ----- Please let patient know that they had a successful PAP titration and let DME know that orders are in EPIC.  Please set up 10 week OV with me.

## 2019-08-24 ENCOUNTER — Ambulatory Visit (HOSPITAL_COMMUNITY): Payer: 59

## 2019-08-24 ENCOUNTER — Telehealth (HOSPITAL_COMMUNITY): Payer: Self-pay

## 2019-08-24 NOTE — Telephone Encounter (Signed)
No response from pt regarding CR.  Closed referral.  

## 2019-08-29 ENCOUNTER — Ambulatory Visit (HOSPITAL_COMMUNITY): Payer: 59

## 2019-08-31 ENCOUNTER — Ambulatory Visit (HOSPITAL_COMMUNITY): Payer: 59

## 2019-09-03 ENCOUNTER — Ambulatory Visit (HOSPITAL_COMMUNITY): Payer: 59

## 2019-09-05 ENCOUNTER — Ambulatory Visit (HOSPITAL_COMMUNITY): Payer: 59

## 2019-09-07 ENCOUNTER — Ambulatory Visit (HOSPITAL_COMMUNITY): Payer: 59

## 2019-09-08 ENCOUNTER — Other Ambulatory Visit: Payer: Self-pay | Admitting: Family Medicine

## 2019-09-08 DIAGNOSIS — F418 Other specified anxiety disorders: Secondary | ICD-10-CM

## 2019-09-08 NOTE — Telephone Encounter (Signed)
Requested Prescriptions  Pending Prescriptions Disp Refills  . DULoxetine (CYMBALTA) 30 MG capsule [Pharmacy Med Name: DULOXETINE HCL DR 30 MG CAP] 90 capsule 1    Sig: TAKE 1 CAPSULE BY MOUTH EVERY DAY     Psychiatry: Antidepressants - SNRI Failed - 09/08/2019  2:33 PM      Failed - Last BP in normal range    BP Readings from Last 1 Encounters:  08/17/19 (!) 136/96         Passed - Completed PHQ-2 or PHQ-9 in the last 360 days.      Passed - Valid encounter within last 6 months    Recent Outpatient Visits          3 weeks ago Fibromyalgia   [facility], Meda Coffee, MD   1 month ago Myofascial pain   Primary Care at [facility], Lonna Cobb, NP   5 months ago Depression with anxiety   Primary Care at [facility], Meda Coffee, MD   9 months ago CHF (congestive heart failure), NYHA class II, acute, systolic [facility])   [facility] at [facility], Meda Coffee, MD   11 months ago CHF (congestive heart failure), NYHA class II, acute, systolic [facility])   [facility] at [facility], Meda Coffee, MD      Future Appointments            In 1 week Lyn Records, MD [facility], [facility]   In 2 months Leretha Pol, Meda Coffee, MD Primary Care at [place], [facility]

## 2019-09-10 ENCOUNTER — Ambulatory Visit (HOSPITAL_COMMUNITY): Payer: 59

## 2019-09-11 ENCOUNTER — Telehealth: Payer: Self-pay | Admitting: Cardiology

## 2019-09-11 NOTE — Telephone Encounter (Signed)
New message  Patient is calling in to speak with Dr. Mayford Knife about her CPAP machine and getting it from [facility]. She states that she has been in contact with [facility] and there is some kind of disconnect because she has been waiting and not getting anywhere with it. Please give patient a call back.

## 2019-09-12 ENCOUNTER — Ambulatory Visit (HOSPITAL_COMMUNITY): Payer: 59

## 2019-09-13 NOTE — Progress Notes (Signed)
Cardiology Office Note:    Date:  09/17/2019   ID:  Samantha Romero, DOB 05-Nov-1975, MRN [id number]  PCP:  Myles Lipps, MD  Cardiologist:  Lesleigh Noe, MD   Referring MD: Myles Lipps, MD   Chief Complaint  Patient presents with  . Congestive Heart Failure    History of Present Illness:    Samantha Romero is a 44 y.o. female with a hx of systolic heart failure( 7/20 EF 40% --> 1/21 EF 30%), obesity, obstructive sleep apnea, CPAP, bariatric surgery, and asthma.  She is doing well.  She is now working on the [employer].  She feels that she is going to better manage her time and be able to maintain healthy habits including exercise.  She denies orthopnea, PND, and wheezing.  No chest pain.  No episodes of syncope.  She does not express medication intolerance.  Past Medical History:  Diagnosis Date  . Anxiety    panic attacks  . Asthma   . CHF (congestive heart failure), NYHA class II, acute, systolic (HCC) 10/06/2018  . Depression   . Fibromyalgia   . Hypertension    no meds currently  . No pertinent past medical history   . Pneumonia    hx of   . Sinus problem     Past Surgical History:  Procedure Laterality Date  . ESOPHAGOGASTRODUODENOSCOPY N/A 11/26/2014   Procedure: ESOPHAGOGASTRODUODENOSCOPY (EGD);  Surgeon: Ovidio Kin, MD;  Location: [facility];  Service: General;  Laterality: N/A;  . GASTRIC BANDING PORT REVISION  08/24/11  . LAPAROSCOPIC GASTRIC BANDING  12/26/2007  . LAPAROSCOPIC LYSIS OF ADHESIONS N/A 01/06/2015   Procedure: LAPAROSCOPIC LYSIS OF ADHESIONS;  Surgeon: Luretha Murphy, MD;  Location: [facility];  Service: General;  Laterality: N/A;  . LAPAROSCOPIC REPAIR AND REMOVAL OF GASTRIC BAND N/A 01/06/2015   Procedure: LAPAROSCOPIC RESIGHTING OF GASTRIC Band PORT;  Surgeon: Luretha Murphy, MD;  Location: [facility];  Service: General;  Laterality: N/A;  . NO PAST SURGERIES      Current Medications: Current Meds  Medication  Sig  . albuterol (PROAIR HFA) 108 (90 Base) MCG/ACT inhaler Inhale 2 puffs into the lungs every 6 (six) hours as needed for wheezing or shortness of breath.   Marland Kitchen albuterol (PROVENTIL) (2.5 MG/3ML) 0.083% nebulizer solution Take 3 mLs (2.5 mg total) by nebulization every 6 (six) hours as needed for wheezing or shortness of breath.  . ALPRAZolam (XANAX) 1 MG tablet TAKE 1 TABLET BY MOUTH 3 TIMES DAILY AS NEEDED FOR ANXIETY.  Marland Kitchen azelastine (ASTELIN) 0.1 % nasal spray Place 2 sprays into both nostrils 2 (two) times daily.  Marland Kitchen buPROPion (WELLBUTRIN XL) 150 MG 24 hr tablet Take 3 tablets (450 mg total) by mouth daily.  . carvedilol (COREG) 25 MG tablet Take 1 tablet (25 mg total) by mouth 2 (two) times daily with a meal.  . cetirizine (ZYRTEC) 10 MG tablet Take 10 mg by mouth daily with breakfast.  . DULoxetine (CYMBALTA) 30 MG capsule TAKE 1 CAPSULE BY MOUTH EVERY DAY  . fluticasone (FLONASE) 50 MCG/ACT nasal spray Place 1 spray into both nostrils every morning.  . furosemide (LASIX) 40 MG tablet Take 1 tablet (40 mg total) by mouth 2 (two) times daily.  Marland Kitchen levalbuterol (XOPENEX HFA) 45 MCG/ACT inhaler Inhale 2 puffs into the lungs every 6 (six) hours as needed for wheezing.  . levalbuterol (XOPENEX) 1.25 MG/3ML nebulizer solution Take 1.25 mg by nebulization every 6 (six)  hours as needed for wheezing.  . mometasone-formoterol (DULERA) 200-5 MCG/ACT AERO Inhale 2 puffs into the lungs 2 (two) times a day.  . montelukast (SINGULAIR) 10 MG tablet TAKE 1 TABLET BY MOUTH EVERYDAY AT BEDTIME  . omeprazole (PRILOSEC) 40 MG capsule TAKE 1 CAPSULE BY MOUTH EVERY DAY  . sacubitril-valsartan (ENTRESTO) 97-103 MG Take 1 tablet by mouth 2 (two) times daily.  Marland Kitchen spironolactone (ALDACTONE) 25 MG tablet Take 1 tablet (25 mg total) by mouth daily.     Allergies:   Grass extracts [gramineae pollens] and Shellfish allergy   Social History   Socioeconomic History  . Marital status: Single    Spouse name: Not on file  .  Number of children: Not on file  . Years of education: Not on file  . Highest education level: Not on file  Occupational History  . Not on file  Tobacco Use  . Smoking status: Never Smoker  . Smokeless tobacco: Never Used  Vaping Use  . Vaping Use: Never used  Substance and Sexual Activity  . Alcohol use: Yes    Comment: socially  . Drug use: Never  . Sexual activity: Not Currently    Birth control/protection: I.U.D.    Comment: Mirena  Other Topics Concern  . Not on file  Social History Narrative  . Not on file   Social Determinants of Health   Financial Resource Strain:   . Difficulty of Paying Living Expenses:   Food Insecurity:   . Worried About Programme researcher, broadcasting/film/video in the Last Year:   . Barista in the Last Year:   Transportation Needs:   . Freight forwarder (Medical):   Marland Kitchen Lack of Transportation (Non-Medical):   Physical Activity:   . Days of Exercise per Week:   . Minutes of Exercise per Session:   Stress:   . Feeling of Stress :   Social Connections:   . Frequency of Communication with Friends and Family:   . Frequency of Social Gatherings with Friends and Family:   . Attends Religious Services:   . Active Member of Clubs or Organizations:   . Attends Banker Meetings:   Marland Kitchen Marital Status:      Family History: The patient's family history includes Cancer in her maternal grandfather and maternal uncle.  ROS:   Please see the history of present illness.    Appetite is been stable.  She does have sleep apnea.  She has not yet started therapy and is awaiting equipment.  All other systems reviewed and are negative.  EKGs/Labs/Other Studies Reviewed:    The following studies were reviewed today: No new data.  Recent Labs: 10/05/2018: B Natriuretic Peptide 86.2 10/10/2018: Hemoglobin 13.2; Platelets 268 05/07/2019: NT-Pro BNP 79 06/06/2019: BUN 12; Creatinine, Ser 1.01; Potassium 4.9; Sodium 139 08/17/2019: TSH 1.230  Recent Lipid  Panel    Component Value Date/Time   CHOL 154 08/17/2019 1053   TRIG 105 08/17/2019 1053   HDL 53 08/17/2019 1053   CHOLHDL 2.9 08/17/2019 1053   LDLCALC 82 08/17/2019 1053    Physical Exam:    VS:  BP 113/76   Pulse 90   Ht 5\' 2"  (1.575 m)   Wt (!) 315 lb 1.9 oz (142.9 kg)   SpO2 98%   BMI 57.64 kg/m     Wt Readings from Last 3 Encounters:  09/17/19 (!) 315 lb 1.9 oz (142.9 kg)  08/20/19 (!) 320 lb (145.2 kg)  08/17/19 (!) 320  lb 6.4 oz (145.3 kg)     GEN: Morbid obesity. No acute distress HEENT: Normal NECK: No JVD. LYMPHATICS: No lymphadenopathy CARDIAC:  RRR without murmur, gallop, or edema. VASCULAR:  Normal Pulses. No bruits. RESPIRATORY:  Clear to auscultation without rales, wheezing or rhonchi  ABDOMEN: Soft, non-tender, non-distended, No pulsatile mass, MUSCULOSKELETAL: No deformity  SKIN: Warm and dry NEUROLOGIC:  Alert and oriented x 3 PSYCHIATRIC:  Normal affect   ASSESSMENT:    1. Chronic systolic heart failure ([place])   2. OSA on CPAP   3. Essential hypertension   4. Class 3 severe obesity due to excess calories without serious comorbidity with body mass index (BMI) of 45.0 to 49.9 in adult [facility])   5. Educated about COVID-19 virus infection    PLAN:    In order of problems listed above:  1. Current therapy includes Entresto 97/103 mg twice daily, spironolactone 25 mg/day, furosemide 40 mg/day, and carvedilol 25 mg twice daily.  Last basic metabolic panel was in March 2021.  The med needs to be repeated.  Only therapy missing from her treatment regimen is an SGLT2 which may be started if LVEF remains low after CPAP is started.  2D Doppler echocardiogram will be performed in September. 2. She has not yet started CPAP but will be starting this soon.. 3. Low-salt diettherapy as listed above which she is also controlling blood pressure.  Target blood pressure is 130/80 mmHg or less. 4. She will get on a walking program.  She never started cardiac rehab  because of work schedule. 5. Received the COVID-19 vaccine.  Overall education and awareness concerning primary/secondary risk prevention was discussed in detail: LDL less than 70, hemoglobin A1c less than 7, blood pressure target less than 130/80 mmHg, >150 minutes of moderate aerobic activity per week, avoidance of smoking, weight control (via diet and exercise), and continued surveillance/management of/for obstructive sleep apnea.     Medication Adjustments/Labs and Tests Ordered: Current medicines are reviewed at length with the patient today.  Concerns regarding medicines are outlined above.  Orders Placed This Encounter  Procedures  . Basic metabolic panel  . ECHOCARDIOGRAM COMPLETE   No orders of the defined types were placed in this encounter.   Patient Instructions  Medication Instructions:  Your physician recommends that you continue on your current medications as directed. Please refer to the Current Medication list given to you today.  *If you need a refill on your cardiac medications before your next appointment, please call your pharmacy*   Lab Work: BMET when you come for your echocardiogram  If you have labs (blood work) drawn today and your tests are completely normal, you will receive your results only by: Marland Kitchen MyChart Message (if you have MyChart) OR . A paper copy in the mail If you have any lab test that is abnormal or we need to change your treatment, we will call you to review the results.   Testing/Procedures: Your physician has requested that you have an echocardiogram in early September. Echocardiography is a painless test that uses sound waves to create images of your heart. It provides your doctor with information about the size and shape of your heart and how well your heart's chambers and valves are working. This procedure takes approximately one hour. There are no restrictions for this procedure.     Follow-Up: At [facility], you and your  health needs are our priority.  As part of our continuing mission to provide you with exceptional heart care,  we have created designated Provider Care Teams.  These Care Teams include your primary Cardiologist (physician) and Advanced Practice Providers (APPs -  Physician Assistants and Nurse Practitioners) who all work together to provide you with the care you need, when you need it.  We recommend signing up for the patient portal called "MyChart".  Sign up information is provided on this After Visit Summary.  MyChart is used to connect with patients for Virtual Visits (Telemedicine).  Patients are able to view lab/test results, encounter notes, upcoming appointments, etc.  Non-urgent messages can be sent to your provider as well.   To learn more about what you can do with MyChart, go to [email or web address].    Your next appointment:   3 month(s)- make sure after echo  The format for your next appointment:   In Person  Provider:   You may see Lesleigh Noe, MD or one of the following Advanced Practice Providers on your designated Care Team:    Norma Fredrickson, NP  Nada Boozer, NP  Georgie Chard, NP    Other Instructions      Signed, Lesleigh Noe, MD  09/17/2019 12:49 PM    [facility]

## 2019-09-13 NOTE — Telephone Encounter (Signed)
Called and left a message for the patient stating it takes 15 business days to hear back from the insurance and then the dme will order the unit and call the patient when the unit comes in.

## 2019-09-14 ENCOUNTER — Ambulatory Visit (HOSPITAL_COMMUNITY): Payer: 59

## 2019-09-14 ENCOUNTER — Other Ambulatory Visit: Payer: 59

## 2019-09-17 ENCOUNTER — Ambulatory Visit (INDEPENDENT_AMBULATORY_CARE_PROVIDER_SITE_OTHER): Payer: 59 | Admitting: Interventional Cardiology

## 2019-09-17 ENCOUNTER — Encounter: Payer: Self-pay | Admitting: Interventional Cardiology

## 2019-09-17 ENCOUNTER — Other Ambulatory Visit: Payer: Self-pay

## 2019-09-17 ENCOUNTER — Ambulatory Visit (HOSPITAL_COMMUNITY): Payer: 59

## 2019-09-17 VITALS — BP 113/76 | HR 90 | Ht 62.0 in | Wt 315.1 lb

## 2019-09-17 DIAGNOSIS — Z9989 Dependence on other enabling machines and devices: Secondary | ICD-10-CM

## 2019-09-17 DIAGNOSIS — G4733 Obstructive sleep apnea (adult) (pediatric): Secondary | ICD-10-CM

## 2019-09-17 DIAGNOSIS — I5022 Chronic systolic (congestive) heart failure: Secondary | ICD-10-CM | POA: Diagnosis not present

## 2019-09-17 DIAGNOSIS — Z7189 Other specified counseling: Secondary | ICD-10-CM

## 2019-09-17 DIAGNOSIS — I10 Essential (primary) hypertension: Secondary | ICD-10-CM | POA: Diagnosis not present

## 2019-09-17 DIAGNOSIS — Z6841 Body Mass Index (BMI) 40.0 and over, adult: Secondary | ICD-10-CM

## 2019-09-17 NOTE — Patient Instructions (Signed)
Medication Instructions:  Your physician recommends that you continue on your current medications as directed. Please refer to the Current Medication list given to you today.  *If you need a refill on your cardiac medications before your next appointment, please call your pharmacy*   Lab Work: BMET when you come for your echocardiogram  If you have labs (blood work) drawn today and your tests are completely normal, you will receive your results only by: Marland Kitchen MyChart Message (if you have MyChart) OR . A paper copy in the mail If you have any lab test that is abnormal or we need to change your treatment, we will call you to review the results.   Testing/Procedures: Your physician has requested that you have an echocardiogram in early September. Echocardiography is a painless test that uses sound waves to create images of your heart. It provides your doctor with information about the size and shape of your heart and how well your heart's chambers and valves are working. This procedure takes approximately one hour. There are no restrictions for this procedure.     Follow-Up: At [facility], you and your health needs are our priority.  As part of our continuing mission to provide you with exceptional heart care, we have created designated Provider Care Teams.  These Care Teams include your primary Cardiologist (physician) and Advanced Practice Providers (APPs -  Physician Assistants and Nurse Practitioners) who all work together to provide you with the care you need, when you need it.  We recommend signing up for the patient portal called "MyChart".  Sign up information is provided on this After Visit Summary.  MyChart is used to connect with patients for Virtual Visits (Telemedicine).  Patients are able to view lab/test results, encounter notes, upcoming appointments, etc.  Non-urgent messages can be sent to your provider as well.   To learn more about what you can do with MyChart, go to  [email or web address].    Your next appointment:   3 month(s)- make sure after echo  The format for your next appointment:   In Person  Provider:   You may see Lesleigh Noe, MD or one of the following Advanced Practice Providers on your designated Care Team:    Norma Fredrickson, NP  Nada Boozer, NP  Georgie Chard, NP    Other Instructions

## 2019-09-19 ENCOUNTER — Ambulatory Visit (HOSPITAL_COMMUNITY): Payer: 59

## 2019-09-21 ENCOUNTER — Ambulatory Visit (HOSPITAL_COMMUNITY): Payer: 59

## 2019-09-24 ENCOUNTER — Ambulatory Visit (HOSPITAL_COMMUNITY): Payer: 59

## 2019-09-26 ENCOUNTER — Ambulatory Visit (HOSPITAL_COMMUNITY): Payer: 59

## 2019-09-28 ENCOUNTER — Ambulatory Visit (HOSPITAL_COMMUNITY): Payer: 59

## 2019-10-05 NOTE — Telephone Encounter (Signed)
Patient has a 10 week follow up appointment scheduled for 11-07-19. Patient understands she needs to keep this appointment for insurance compliance. Patient was grateful for the call and thanked me.  

## 2019-10-10 ENCOUNTER — Other Ambulatory Visit: Payer: Self-pay

## 2019-10-10 DIAGNOSIS — F418 Other specified anxiety disorders: Secondary | ICD-10-CM

## 2019-10-10 MED ORDER — BUPROPION HCL ER (XL) 150 MG PO TB24: 450.0000 mg | ORAL_TABLET | Freq: Every day | ORAL | 1 refills | Status: DC

## 2019-10-23 ENCOUNTER — Other Ambulatory Visit: Payer: Self-pay | Admitting: Family Medicine

## 2019-10-23 NOTE — Telephone Encounter (Signed)
Requested medication (s) are due for refill today:yes  Requested medication (s) are on the active medication list:yes  Last refill: 08/17/19  #40  0 refills  Future visit scheduled  yes  11/19/19  Notes to clinic: not delegated  Requested Prescriptions  Pending Prescriptions Disp Refills   ALPRAZolam (XANAX) 1 MG tablet [Pharmacy Med Name: ALPRAZOLAM 1 MG TABLET] 40 tablet     Sig: TAKE 1 TABLET BY MOUTH 3 TIMES DAILY AS NEEDED FOR ANXIETY.      Not Delegated - Psychiatry:  Anxiolytics/Hypnotics Failed - 10/23/2019  4:57 PM      Failed - This refill cannot be delegated      Failed - Urine Drug Screen completed in last 360 days.      Passed - Valid encounter within last 6 months    Recent Outpatient Visits           2 months ago Fibromyalgia   [facility] at [facility], Meda Coffee, MD   3 months ago Myofascial pain   [facility] Care at [facility], Lonna Cobb, NP   6 months ago Depression with anxiety   Primary Care at [facility], Meda Coffee, MD   11 months ago CHF (congestive heart failure), NYHA class II, acute, systolic [facility])   Primary Care at [facility], Meda Coffee, MD   1 year ago CHF (congestive heart failure), NYHA class II, acute, systolic [facility])   Primary Care at [facility], Meda Coffee, MD       Future Appointments             In 2 weeks Turner, Cornelious Bryant, MD [facility], [facility]   In 3 weeks Myles Lipps, MD Primary Care at [place], [place]   In 1 month Lyn Records, MD [facility], [facility]

## 2019-10-24 NOTE — Telephone Encounter (Signed)
pmp reviewd, appropriate meds refilled 

## 2019-10-24 NOTE — Telephone Encounter (Signed)
Patient is requesting refill for Alprazolam 1 mg take as needed for anxiety. Last office visit and refill for 40 tablets was 08/17/2019, next scheduled visit 11/19/2019. Thank you.

## 2019-11-06 NOTE — Progress Notes (Signed)
Virtual Visit via Telephone Note   This visit type was conducted due to national recommendations for restrictions regarding the COVID-19 Pandemic (e.g. social distancing) in an effort to limit this patient's exposure and mitigate transmission in our community.  Due to her co-morbid illnesses, this patient is at least at moderate risk for complications without adequate follow up.  This format is felt to be most appropriate for this patient at this time.  The patient did not have access to video technology/had technical difficulties with video requiring transitioning to audio format only (telephone).  All issues noted in this document were discussed and addressed.  No physical exam could be performed with this format.  Please refer to the patient's chart for her  consent to telehealth for [facility].   Evaluation Performed:  Follow-up visit  This visit type was conducted due to national recommendations for restrictions regarding the COVID-19 Pandemic (e.g. social distancing).  This format is felt to be most appropriate for this patient at this time.  All issues noted in this document were discussed and addressed.  No physical exam was performed (except for noted visual exam findings with Video Visits).  Please refer to the patient's chart (MyChart message for video visits and phone note for telephone visits) for the patient's consent to telehealth for [facility]  Date:  11/07/2019   ID:  Samantha Romero, DOB Feb 24, 1976, MRN [id number]  Patient Location:  Home  Provider location:   [place]  PCP:  Myles Lipps, MD  Cardiologist:  Lesleigh Noe, MD  Sleep Medicine:  Armanda Magic, MD Electrophysiologist:  None   Chief Complaint:  OSA  History of Present Illness:    Samantha Romero is a 44 y.o. female who presents via audio/video conferencing for a telehealth visit today.    This is a 44yo female with a hx of HTN, obesity and chronic systolic CHF.  She was referred for sleep  study due to snoring and obesity.  She says that she was not sleeping well at night waking up frequently at night for no reason.  She also has morbid obesity and Dr. Katrinka Blazing was worried about OSA.   She was found to have mild to moderate OSA with an AHI of 14.5/hr and O2 sats as low as 82%.  She underwent CPAP titration to 9cm H2O.  She is now her for further evaluation.   She is doing well with her CPAP device and thinks that she has gotten used to it.  She has had some problems with the nasal mask and has tried several different masks due to issues with fitting on her face.  She has now ordered a different head gear to see if it fits better.  She wakes up with the mask leaking at the top.  She feels the pressure is adequate.  Since going on CPAP she feels rested in the am and has no significant daytime sleepiness.  She denies any significant mouth or nasal dryness or nasal congestion.  She does not think that he snores.    The patient does not have symptoms concerning for COVID-19 infection (fever, chills, cough, or new shortness of breath).   Prior CV studies:   The following studies were reviewed today:  SLeep study and CPAP titration, PAP compliance download  Past Medical History:  Diagnosis Date  . Anxiety    panic attacks  . Asthma   . CHF (congestive heart failure), NYHA class II, acute, systolic (HCC) 10/06/2018  .  Depression   . Fibromyalgia   . Hypertension    no meds currently  . No pertinent past medical history   . Pneumonia    hx of   . Sinus problem    Past Surgical History:  Procedure Laterality Date  . ESOPHAGOGASTRODUODENOSCOPY N/A 11/26/2014   Procedure: ESOPHAGOGASTRODUODENOSCOPY (EGD);  Surgeon: Ovidio Kin, MD;  Location: [facility];  Service: General;  Laterality: N/A;  . GASTRIC BANDING PORT REVISION  08/24/11  . LAPAROSCOPIC GASTRIC BANDING  12/26/2007  . LAPAROSCOPIC LYSIS OF ADHESIONS N/A 01/06/2015   Procedure: LAPAROSCOPIC LYSIS OF ADHESIONS;  Surgeon:  Luretha Murphy, MD;  Location: [facility];  Service: General;  Laterality: N/A;  . LAPAROSCOPIC REPAIR AND REMOVAL OF GASTRIC BAND N/A 01/06/2015   Procedure: LAPAROSCOPIC RESIGHTING OF GASTRIC Band PORT;  Surgeon: Luretha Murphy, MD;  Location: [facility];  Service: General;  Laterality: N/A;  . NO PAST SURGERIES       Current Meds  Medication Sig  . albuterol (PROAIR HFA) 108 (90 Base) MCG/ACT inhaler Inhale 2 puffs into the lungs every 6 (six) hours as needed for wheezing or shortness of breath.   Marland Kitchen albuterol (PROVENTIL) (2.5 MG/3ML) 0.083% nebulizer solution Take 3 mLs (2.5 mg total) by nebulization every 6 (six) hours as needed for wheezing or shortness of breath.  . ALPRAZolam (XANAX) 1 MG tablet TAKE 1 TABLET BY MOUTH 3 TIMES DAILY AS NEEDED FOR ANXIETY.  Marland Kitchen azelastine (ASTELIN) 0.1 % nasal spray Place 2 sprays into both nostrils 2 (two) times daily.  Marland Kitchen buPROPion (WELLBUTRIN XL) 150 MG 24 hr tablet Take 3 tablets (450 mg total) by mouth daily.  . carvedilol (COREG) 25 MG tablet Take 1 tablet (25 mg total) by mouth 2 (two) times daily with a meal.  . cetirizine (ZYRTEC) 10 MG tablet Take 10 mg by mouth daily with breakfast.  . DULoxetine (CYMBALTA) 30 MG capsule TAKE 1 CAPSULE BY MOUTH EVERY DAY  . fluticasone (FLONASE) 50 MCG/ACT nasal spray Place 1 spray into both nostrils every morning.  . furosemide (LASIX) 40 MG tablet Take 1 tablet (40 mg total) by mouth 2 (two) times daily.  Marland Kitchen levalbuterol (XOPENEX HFA) 45 MCG/ACT inhaler Inhale 2 puffs into the lungs every 6 (six) hours as needed for wheezing.  . levalbuterol (XOPENEX) 1.25 MG/3ML nebulizer solution Take 1.25 mg by nebulization every 6 (six) hours as needed for wheezing.  . mometasone-formoterol (DULERA) 200-5 MCG/ACT AERO Inhale 2 puffs into the lungs 2 (two) times a day.  . montelukast (SINGULAIR) 10 MG tablet TAKE 1 TABLET BY MOUTH EVERYDAY AT BEDTIME  . omeprazole (PRILOSEC) 40 MG capsule TAKE 1 CAPSULE BY MOUTH EVERY DAY  .  sacubitril-valsartan (ENTRESTO) 97-103 MG Take 1 tablet by mouth 2 (two) times daily.  Marland Kitchen spironolactone (ALDACTONE) 25 MG tablet Take 1 tablet (25 mg total) by mouth daily.     Allergies:   Grass extracts [gramineae pollens] and Shellfish allergy   Social History   Tobacco Use  . Smoking status: Never Smoker  . Smokeless tobacco: Never Used  Vaping Use  . Vaping Use: Never used  Substance Use Topics  . Alcohol use: Yes    Comment: socially  . Drug use: Never     Family Hx: The patient's family history includes Cancer in her maternal grandfather and maternal uncle.  ROS:   Please see the history of present illness.     All other systems reviewed and are negative.   Labs/Other Tests and Data Reviewed:  Recent Labs: 05/07/2019: NT-Pro BNP 79 06/06/2019: BUN 12; Creatinine, Ser 1.01; Potassium 4.9; Sodium 139 08/17/2019: TSH 1.230   Recent Lipid Panel Lab Results  Component Value Date/Time   CHOL 154 08/17/2019 10:53 AM   TRIG 105 08/17/2019 10:53 AM   HDL 53 08/17/2019 10:53 AM   CHOLHDL 2.9 08/17/2019 10:53 AM   LDLCALC 82 08/17/2019 10:53 AM    Wt Readings from Last 3 Encounters:  11/07/19 (!) 315 lb (142.9 kg)  09/17/19 (!) 315 lb 1.9 oz (142.9 kg)  08/20/19 (!) 320 lb (145.2 kg)     Objective:    Vital Signs:  Ht 5\' 2"  (1.575 m)   Wt (!) 315 lb (142.9 kg)   BMI 57.61 kg/m     ASSESSMENT & PLAN:    1.  OSA - The pathophysiology of obstructive sleep apnea , it's cardiovascular consequences & modes of treatment including CPAP were discused with the patient in detail & they evidenced understanding.  The patient is tolerating PAP therapy well without any problems. The PAP download was reviewed today and showed an AHI of 2.5/hr on 9 cm H2O with 23% compliance in using more than 4 hours nightly.  The patient has been using and benefiting from PAP use and will continue to benefit from therapy.  -her compliance is down due to problems with finding a mask that fits  correctly without leaking -I have recommended that she try a nasal pillow mask which she will purchase on line -She has a mask on order that she is going to try -I will get a download in 4 weeks and see her back in 6 weeks  2.  HTN -continue spiro 25mg  daily, Carvedilol 25mg  BID, Entresto 97-103mg  BID  3.  Morbid Obesity -I have encouraged her to get into a routine exercise program and cut back on carbs and portions.    COVID-19 Education: The signs and symptoms of COVID-19 were discussed with the patient and how to seek care for testing (follow up with PCP or arrange E-visit).  The importance of social distancing was discussed today.  Patient Risk:   After full review of this patient's clinical status, I feel that they are at least moderate risk at this time.  Time:   Today, I have spent 20 minutes on telemedicine discussing medical problems including OSA< HTN, obesity and reviewing patient's chart including sleep study, CPAP titration and PAP compliance download.  Medication Adjustments/Labs and Tests Ordered: Current medicines are reviewed at length with the patient today.  Concerns regarding medicines are outlined above.  Tests Ordered: No orders of the defined types were placed in this encounter.  Medication Changes: No orders of the defined types were placed in this encounter.   Disposition:  Follow up in 1 year(s)  Signed, , MD  11/07/2019 9:08 AM    [facility]

## 2019-11-07 ENCOUNTER — Other Ambulatory Visit: Payer: Self-pay

## 2019-11-07 ENCOUNTER — Encounter: Payer: Self-pay | Admitting: Interventional Cardiology

## 2019-11-07 ENCOUNTER — Encounter: Payer: Self-pay | Admitting: Cardiology

## 2019-11-07 ENCOUNTER — Ambulatory Visit (INDEPENDENT_AMBULATORY_CARE_PROVIDER_SITE_OTHER): Payer: 59 | Admitting: Interventional Cardiology

## 2019-11-07 ENCOUNTER — Telehealth (INDEPENDENT_AMBULATORY_CARE_PROVIDER_SITE_OTHER): Payer: 59 | Admitting: Cardiology

## 2019-11-07 VITALS — BP 110/84 | HR 102 | Ht 62.0 in | Wt 316.0 lb

## 2019-11-07 VITALS — Ht 62.0 in | Wt 315.0 lb

## 2019-11-07 DIAGNOSIS — I10 Essential (primary) hypertension: Secondary | ICD-10-CM | POA: Diagnosis not present

## 2019-11-07 DIAGNOSIS — Z9989 Dependence on other enabling machines and devices: Secondary | ICD-10-CM | POA: Diagnosis not present

## 2019-11-07 DIAGNOSIS — I5022 Chronic systolic (congestive) heart failure: Secondary | ICD-10-CM

## 2019-11-07 DIAGNOSIS — Z7189 Other specified counseling: Secondary | ICD-10-CM

## 2019-11-07 DIAGNOSIS — R0602 Shortness of breath: Secondary | ICD-10-CM

## 2019-11-07 DIAGNOSIS — G4733 Obstructive sleep apnea (adult) (pediatric): Secondary | ICD-10-CM

## 2019-11-07 NOTE — Patient Instructions (Signed)
Medication Instructions:  1) TAKE Furosemide 80mg  tonight and tomorrow morning and then return to 40mg  twice daily  *If you need a refill on your cardiac medications before your next appointment, please call your pharmacy*   Lab Work: None If you have labs (blood work) drawn today and your tests are completely normal, you will receive your results only by: MyChart Message (if you have MyChart) OR . A paper copy in the mail If you have any lab test that is abnormal or we need to change your treatment, we will call you to review the results.   Testing/Procedures: None   Follow-Up:  Keep current appointment for September  Other Instructions

## 2019-11-07 NOTE — Progress Notes (Signed)
Cardiology Office Note:    Date:  11/07/2019   ID:  Samantha Romero, DOB Jul 25, 1975, MRN [id number]  PCP:  Myles Lipps, MD  Cardiologist:  Lesleigh Noe, MD   Referring MD: Myles Lipps, MD   Chief Complaint  Patient presents with  . Shortness of Breath    History of Present Illness:    Samantha Romero is a 44 y.o. female with a hx of systolic heart failure( 7/20 EF 40% -> 1/21 EF 30%), obesity,obstructive sleep apnea,CPAP, bariatric surgery, and asthma.  Presents with a several day history of l focal left parasternal sharp pain with some discomfort of the left arm and dyspnea on exertion.  She has not had orthopnea, lower extremity swelling, or PND.  No palpitations.  The chest discomfort is dissimilar to anything she has ever had before.  2 days at around 1 PM she felt a slight sharp sticking pain in the left mid sternal area.  Later that day after having walked quite a distance, she had the same discomfort with some tingling/discomfort in the left arm.  Each episode lasted less than 15 minutes.  She has never had coronary evaluation.  Past Medical History:  Diagnosis Date  . Anxiety    panic attacks  . Asthma   . CHF (congestive heart failure), NYHA class II, acute, systolic (HCC) 10/06/2018  . Depression   . Fibromyalgia   . Hypertension    no meds currently  . No pertinent past medical history   . Pneumonia    hx of   . Sinus problem     Past Surgical History:  Procedure Laterality Date  . ESOPHAGOGASTRODUODENOSCOPY N/A 11/26/2014   Procedure: ESOPHAGOGASTRODUODENOSCOPY (EGD);  Surgeon: Ovidio Kin, MD;  Location: [facility];  Service: General;  Laterality: N/A;  . GASTRIC BANDING PORT REVISION  08/24/11  . LAPAROSCOPIC GASTRIC BANDING  12/26/2007  . LAPAROSCOPIC LYSIS OF ADHESIONS N/A 01/06/2015   Procedure: LAPAROSCOPIC LYSIS OF ADHESIONS;  Surgeon: Luretha Murphy, MD;  Location: [facility];  Service: General;  Laterality: N/A;  . LAPAROSCOPIC REPAIR AND  REMOVAL OF GASTRIC BAND N/A 01/06/2015   Procedure: LAPAROSCOPIC RESIGHTING OF GASTRIC Band PORT;  Surgeon: Luretha Murphy, MD;  Location: [facility];  Service: General;  Laterality: N/A;  . NO PAST SURGERIES      Current Medications: Current Meds  Medication Sig  . albuterol (PROAIR HFA) 108 (90 Base) MCG/ACT inhaler Inhale 2 puffs into the lungs every 6 (six) hours as needed for wheezing or shortness of breath.   Marland Kitchen albuterol (PROVENTIL) (2.5 MG/3ML) 0.083% nebulizer solution Take 3 mLs (2.5 mg total) by nebulization every 6 (six) hours as needed for wheezing or shortness of breath.  . ALPRAZolam (XANAX) 1 MG tablet TAKE 1 TABLET BY MOUTH 3 TIMES DAILY AS NEEDED FOR ANXIETY.  Marland Kitchen azelastine (ASTELIN) 0.1 % nasal spray Place 2 sprays into both nostrils 2 (two) times daily.  Marland Kitchen buPROPion (WELLBUTRIN XL) 150 MG 24 hr tablet Take 3 tablets (450 mg total) by mouth daily.  . carvedilol (COREG) 25 MG tablet Take 1 tablet (25 mg total) by mouth 2 (two) times daily with a meal.  . cetirizine (ZYRTEC) 10 MG tablet Take 10 mg by mouth daily with breakfast.  . DULoxetine (CYMBALTA) 30 MG capsule TAKE 1 CAPSULE BY MOUTH EVERY DAY  . fluticasone (FLONASE) 50 MCG/ACT nasal spray Place 1 spray into both nostrils every morning.  . furosemide (LASIX) 40 MG tablet Take 1 tablet (40  mg total) by mouth 2 (two) times daily.  Marland Kitchen levalbuterol (XOPENEX HFA) 45 MCG/ACT inhaler Inhale 2 puffs into the lungs every 6 (six) hours as needed for wheezing.  . levalbuterol (XOPENEX) 1.25 MG/3ML nebulizer solution Take 1.25 mg by nebulization every 6 (six) hours as needed for wheezing.  . mometasone-formoterol (DULERA) 200-5 MCG/ACT AERO Inhale 2 puffs into the lungs 2 (two) times a day.  . montelukast (SINGULAIR) 10 MG tablet TAKE 1 TABLET BY MOUTH EVERYDAY AT BEDTIME  . omeprazole (PRILOSEC) 40 MG capsule TAKE 1 CAPSULE BY MOUTH EVERY DAY  . sacubitril-valsartan (ENTRESTO) 97-103 MG Take 1 tablet by mouth 2 (two) times daily.  Marland Kitchen  spironolactone (ALDACTONE) 25 MG tablet Take 1 tablet (25 mg total) by mouth daily.     Allergies:   Grass extracts [gramineae pollens] and Shellfish allergy   Social History   Socioeconomic History  . Marital status: Single    Spouse name: Not on file  . Number of children: Not on file  . Years of education: Not on file  . Highest education level: Not on file  Occupational History  . Not on file  Tobacco Use  . Smoking status: Never Smoker  . Smokeless tobacco: Never Used  Vaping Use  . Vaping Use: Never used  Substance and Sexual Activity  . Alcohol use: Yes    Comment: socially  . Drug use: Never  . Sexual activity: Not Currently    Birth control/protection: I.U.D.    Comment: Mirena  Other Topics Concern  . Not on file  Social History Narrative  . Not on file   Social Determinants of Health   Financial Resource Strain:   . Difficulty of Paying Living Expenses:   Food Insecurity:   . Worried About Programme researcher, broadcasting/film/video in the Last Year:   . Barista in the Last Year:   Transportation Needs:   . Freight forwarder (Medical):   Marland Kitchen Lack of Transportation (Non-Medical):   Physical Activity:   . Days of Exercise per Week:   . Minutes of Exercise per Session:   Stress:   . Feeling of Stress :   Social Connections:   . Frequency of Communication with Friends and Family:   . Frequency of Social Gatherings with Friends and Family:   . Attends Religious Services:   . Active Member of Clubs or Organizations:   . Attends Banker Meetings:   Marland Kitchen Marital Status:      Family History: The patient's family history includes Cancer in her maternal grandfather and maternal uncle.  ROS:   Please see the history of present illness.    Under stress at work.  All other systems reviewed and are negative.  EKGs/Labs/Other Studies Reviewed:    The following studies were reviewed today: No new data  EKG:  EKG sinus tachycardia, biatrial abnormality, poor  R wave progression V1 through V4, leftward axis.  Slight left axis is new compared to prior.  Recent Labs: 05/07/2019: NT-Pro BNP 79 06/06/2019: BUN 12; Creatinine, Ser 1.01; Potassium 4.9; Sodium 139 08/17/2019: TSH 1.230  Recent Lipid Panel    Component Value Date/Time   CHOL 154 08/17/2019 1053   TRIG 105 08/17/2019 1053   HDL 53 08/17/2019 1053   CHOLHDL 2.9 08/17/2019 1053   LDLCALC 82 08/17/2019 1053    Physical Exam:    VS:  BP 110/84   Pulse (!) 102   Ht 5\' 2"  (1.575 m)   Wt )  316 lb (143.3 kg)   BMI 57.80 kg/m     Wt Readings from Last 3 Encounters:  11/07/19 (!) 316 lb (143.3 kg)  11/07/19 (!) 315 lb (142.9 kg)  09/17/19 (!) 315 lb 1.9 oz (142.9 kg)     GEN: Morbidly obese. No acute distress HEENT: Normal NECK: No JVD. LYMPHATICS: No lymphadenopathy CARDIAC:  RRR without murmur, gallop, or edema. VASCULAR:  Normal Pulses. No bruits. RESPIRATORY:  Clear to auscultation without rales, wheezing or rhonchi  ABDOMEN: Soft, non-tender, non-distended, No pulsatile mass, MUSCULOSKELETAL: No deformity  SKIN: Warm and dry NEUROLOGIC:  Alert and oriented x 3 PSYCHIATRIC:  Normal affect   ASSESSMENT:    1. Shortness of breath   2. Chronic systolic heart failure (HCC)   3. Essential hypertension   4. Morbid obesity (HCC)   5. OSA on CPAP   6. Educated about COVID-19 virus infection    PLAN:    In order of problems listed above:  1. Possibly volume overload.  No overt evidence of volume overload although difficult to assess neck veins.  There is no peripheral edema. 2. Under the circumstances I have asked her to increase furosemide 80 mg twice daily s then back to 40 mg twice daily.  3. Blood pressure is excellent 4. Not well controlled 5. Using CPAP 6. Chest pain is atypical.  If it continues, she will need to have an ischemia evaluation either with coronary CTA or coronary angiography.  Clinical observation for now. 7. She has been vaccinated.  Stressed that  she continue to use medications techniques to avoid delta variant infection   Medication Adjustments/Labs and Tests Ordered: Current medicines are reviewed at length with the patient today.  Concerns regarding medicines are outlined above.  Orders Placed This Encounter  Procedures  . EKG 12-Lead   No orders of the defined types were placed in this encounter.   Patient Instructions  Medication Instructions:  1) TAKE Furosemide 80mg  tonight and tomorrow morning and then return to 40mg  twice daily  *If you need a refill on your cardiac medications before your next appointment, please call your pharmacy*   Lab Work: None If you have labs (blood work) drawn today and your tests are completely normal, you will receive your results only by: MyChart Message (if you have MyChart) OR . A paper copy in the mail If you have any lab test that is abnormal or we need to change your treatment, we will call you to review the results.   Testing/Procedures: None   Follow-Up:  Keep current appointment for September  Other Instructions      Signed, Marland Kitchen, MD  11/07/2019 2:39 PM    [facility]

## 2019-11-08 ENCOUNTER — Telehealth: Payer: Self-pay | Admitting: *Deleted

## 2019-11-08 NOTE — Telephone Encounter (Signed)
-----   Message from Theresia Majors, RN sent at 11/07/2019  9:14 AM EDT ----- Per Dr. Mayford Knife, please get a download for patient in 4 weeks and follow up with Dr. Mayford Knife in 6 weeks.  Thanks!

## 2019-11-08 NOTE — Telephone Encounter (Signed)
4 week d/l reminder made and 6 week follow up made.

## 2019-11-19 ENCOUNTER — Other Ambulatory Visit: Payer: Self-pay

## 2019-11-19 ENCOUNTER — Ambulatory Visit (INDEPENDENT_AMBULATORY_CARE_PROVIDER_SITE_OTHER): Payer: 59 | Admitting: Family Medicine

## 2019-11-19 ENCOUNTER — Encounter: Payer: Self-pay | Admitting: Family Medicine

## 2019-11-19 VITALS — BP 165/123 | HR 100 | Temp 98.3°F | Ht 62.0 in | Wt 323.8 lb

## 2019-11-19 DIAGNOSIS — F418 Other specified anxiety disorders: Secondary | ICD-10-CM

## 2019-11-19 DIAGNOSIS — J4540 Moderate persistent asthma, uncomplicated: Secondary | ICD-10-CM

## 2019-11-19 DIAGNOSIS — I5021 Acute systolic (congestive) heart failure: Secondary | ICD-10-CM

## 2019-11-19 DIAGNOSIS — I10 Essential (primary) hypertension: Secondary | ICD-10-CM

## 2019-11-19 MED ORDER — ALPRAZOLAM 1 MG PO TABS: 1.0000 mg | ORAL_TABLET | Freq: Two times a day (BID) | ORAL | 2 refills | Status: DC | PRN

## 2019-11-19 MED ORDER — DULOXETINE HCL 60 MG PO CPEP: 60.0000 mg | ORAL_CAPSULE | Freq: Every day | ORAL | 1 refills | Status: DC

## 2019-11-19 MED ORDER — BUPROPION HCL ER (XL) 150 MG PO TB24: 150.0000 mg | ORAL_TABLET | Freq: Every day | ORAL | 1 refills | Status: DC

## 2019-11-19 MED ORDER — BUPROPION HCL ER (XL) 300 MG PO TB24: 300.0000 mg | ORAL_TABLET | Freq: Every day | ORAL | 1 refills | Status: DC

## 2019-11-19 MED ORDER — FLUTICASONE-SALMETEROL 232-14 MCG/ACT IN AEPB
1.0000 | INHALATION_SPRAY | Freq: Two times a day (BID) | RESPIRATORY_TRACT | 5 refills | Status: DC
Start: 2019-11-19 — End: 2020-10-27

## 2019-11-19 NOTE — Progress Notes (Signed)
8/23/202112:01 PM  Samantha Romero February 09, 1976, 44 y.o., female [id number]  Chief Complaint  Patient presents with  . Follow-up    chf, depression, anxiety, asthma. has not taken bp meds since Saturday. Had asthma attack, takiing multiple meds to deal with that. Did not want to combine    HPI:   Patient is a 44 y.o. female with past medical history significant for HTNwith CHF, asthma, GERD, depression, anxiety, gastric lap band, fibromyalgia,OSA on cpap who presents today forroutine followup   Last OV may 2021 - restarted duloxetine Saw cards 2 weeks ago - increased lasix due to SOB/?fluid overload x 2 days, then return to 40mg  daily  She has not taken BP meds for past 2 days due to falre up of asthma/allergies due to smoke/grass/dog exposure all in one day now back to normal She does not see asthma/allergist anymore  She checks BP at home 113/70s Today had to run out of the house and not able to take meds  Needs meds changed due to insurance preference  Reports that depression and anxiety are better  She is taking wellbutrin 450 and duloxetine 60mg  daily Takes xanax as needed Tolerating her meds well pmp reviewed Not doing counseling, hoping to be able to do once things calm down at work  Depression screen [facility] 2/9 07/24/2019 04/10/2019 11/14/2018  Decreased Interest 0 0 2  Down, Depressed, Hopeless 0 0 1  PHQ - 2 Score 0 0 3  Altered sleeping - - 3  Tired, decreased energy - - 3  Change in appetite - - 3  Feeling bad or failure about yourself  - - 1  Trouble concentrating - - -  Moving slowly or fidgety/restless - - 2  Suicidal thoughts - - 0  PHQ-9 Score - - 15  Difficult doing work/chores - - Extremely dIfficult    Fall Risk  08/17/2019 07/24/2019 04/10/2019 11/14/2018 10/10/2018  Falls in the past year? 0 0 0 0 0  Number falls in past yr: 0 - 0 0 0  Injury with Fall? 0 - 0 0 0  Follow up - Falls evaluation completed - - -     Allergies  Allergen Reactions  .  Grass Extracts [Gramineae Pollens]     Nasal congestion, post nasal drip   . Shellfish Allergy Itching    In throat only.    Prior to Admission medications   Medication Sig Start Date End Date Taking? Authorizing Provider  albuterol (PROAIR HFA) 108 (90 Base) MCG/ACT inhaler Inhale 2 puffs into the lungs every 6 (six) hours as needed for wheezing or shortness of breath.    Yes [provider]  albuterol (PROVENTIL) (2.5 MG/3ML) 0.083% nebulizer solution Take 3 mLs (2.5 mg total) by nebulization every 6 (six) hours as needed for wheezing or shortness of breath. 08/23/18  Yes 10/12/2018, MD  ALPRAZolam 08/25/18) 1 MG tablet TAKE 1 TABLET BY MOUTH 3 TIMES DAILY AS NEEDED FOR ANXIETY. 10/24/19  Yes Prudy Feeler, MD  azelastine (ASTELIN) 0.1 % nasal spray Place 2 sprays into both nostrils 2 (two) times daily. 10/19/18  Yes Padgett, Myles Lipps, MD  carvedilol (COREG) 25 MG tablet Take 1 tablet (25 mg total) by mouth 2 (two) times daily with a meal. 05/07/19  Yes Pilar Grammes, NP  cetirizine (ZYRTEC) 10 MG tablet Take 10 mg by mouth daily with breakfast.   Yes [provider]  DULoxetine (CYMBALTA) 30 MG capsule TAKE 1 CAPSULE BY MOUTH EVERY  DAY 09/08/19  Yes Myles Lipps, MD  fluticasone [facility]) 50 MCG/ACT nasal spray Place 1 spray into both nostrils every morning.   Yes [provider]  furosemide (LASIX) 40 MG tablet Take 1 tablet (40 mg total) by mouth 2 (two) times daily. 06/06/19  Yes Georgie Chard D, NP  levalbuterol [facility] HFA) 45 MCG/ACT inhaler Inhale 2 puffs into the lungs every 6 (six) hours as needed for wheezing. 10/19/18  Yes Padgett, Pilar Grammes, MD  levalbuterol [facility]) 1.25 MG/3ML nebulizer solution Take 1.25 mg by nebulization every 6 (six) hours as needed for wheezing. 10/19/18  Yes Padgett, Pilar Grammes, MD  mometasone-formoterol (DULERA) 200-5 MCG/ACT AERO Inhale 2 puffs into the lungs 2 (two) times a day. 10/19/18  Yes Padgett,  Pilar Grammes, MD  montelukast (SINGULAIR) 10 MG tablet TAKE 1 TABLET BY MOUTH EVERYDAY AT BEDTIME 02/08/19  Yes Myles Lipps, MD  omeprazole (PRILOSEC) 40 MG capsule TAKE 1 CAPSULE BY MOUTH EVERY DAY 08/17/19  Yes Myles Lipps, MD  sacubitril-valsartan (ENTRESTO) 97-103 MG Take 1 tablet by mouth 2 (two) times daily. 06/06/19  Yes Georgie Chard D, NP  buPROPion (WELLBUTRIN XL) 150 MG 24 hr tablet Take 3 tablets (450 mg total) by mouth daily. 10/10/19 11/09/19  Myles Lipps, MD  spironolactone (ALDACTONE) 25 MG tablet Take 1 tablet (25 mg total) by mouth daily. 06/06/19 11/07/19  Filbert Schilder, NP    Past Medical History:  Diagnosis Date  . Anxiety    panic attacks  . Asthma   . CHF (congestive heart failure), NYHA class II, acute, systolic (HCC) 10/06/2018  . Depression   . Fibromyalgia   . Hypertension    no meds currently  . No pertinent past medical history   . Pneumonia    hx of   . Sinus problem     Past Surgical History:  Procedure Laterality Date  . ESOPHAGOGASTRODUODENOSCOPY N/A 11/26/2014   Procedure: ESOPHAGOGASTRODUODENOSCOPY (EGD);  Surgeon: Ovidio Kin, MD;  Location: [facility];  Service: General;  Laterality: N/A;  . GASTRIC BANDING PORT REVISION  08/24/11  . LAPAROSCOPIC GASTRIC BANDING  12/26/2007  . LAPAROSCOPIC LYSIS OF ADHESIONS N/A 01/06/2015   Procedure: LAPAROSCOPIC LYSIS OF ADHESIONS;  Surgeon: Luretha Murphy, MD;  Location: [facility];  Service: General;  Laterality: N/A;  . LAPAROSCOPIC REPAIR AND REMOVAL OF GASTRIC BAND N/A 01/06/2015   Procedure: LAPAROSCOPIC RESIGHTING OF GASTRIC Band PORT;  Surgeon: Luretha Murphy, MD;  Location: [facility];  Service: General;  Laterality: N/A;  . NO PAST SURGERIES      Social History   Tobacco Use  . Smoking status: Never Smoker  . Smokeless tobacco: Never Used  Substance Use Topics  . Alcohol use: Yes    Comment: socially    Family History  Problem Relation Age of Onset  . Cancer Maternal Uncle         lung  . Cancer Maternal Grandfather        lung and back    Review of Systems  Constitutional: Negative for chills and fever.  Respiratory: Negative for cough and shortness of breath.   Cardiovascular: Negative for chest pain, palpitations and leg swelling.  Gastrointestinal: Negative for abdominal pain, nausea and vomiting.     OBJECTIVE:  Today's Vitals   11/19/19 1152  BP: (!) 165/123  Pulse: 100  Temp: 98.3 F (36.8 C)  SpO2: 100%  Weight: (!) 323 lb 12.8 oz (146.9 kg)  Height: 5\' 2"  (1.575 m)  Body mass index is 59.22 kg/m.   Physical Exam Vitals and nursing note reviewed.  Constitutional:      Appearance: She is well-developed.  HENT:     Head: Normocephalic and atraumatic.     Mouth/Throat:     Pharynx: No oropharyngeal exudate.  Eyes:     General: No scleral icterus.    Extraocular Movements: Extraocular movements intact.     Conjunctiva/sclera: Conjunctivae normal.     Pupils: Pupils are equal, round, and reactive to light.  Cardiovascular:     Rate and Rhythm: Normal rate and regular rhythm.     Heart sounds: Normal heart sounds. No murmur heard.  No friction rub. No gallop.   Pulmonary:     Effort: Pulmonary effort is normal.     Breath sounds: Normal breath sounds. No wheezing, rhonchi or rales.  Musculoskeletal:     Cervical back: Neck supple.  Skin:    General: Skin is warm and dry.  Neurological:     Mental Status: She is alert and oriented to person, place, and time.     No results found for this or any previous visit (from the past 24 hour(s)).  No results found.   ASSESSMENT and PLAN  1. Essential hypertension Elevated. Asymptomatic. Patient off meds for past 2 days. Advise taking meds as soon as she arrives home. Patient's BP was controlled at recent cards OV. - Lipid panel - Comprehensive metabolic panel  2. CHF (congestive heart failure), NYHA class II, acute, systolic (HCC) Managed by cards  3. Depression with  anxiety Stable. Cont current regime. Reach out for counseling referral when ready. wellbutrin being changed due to insurance coverage. - DULoxetine (CYMBALTA) 60 MG capsule; Take 1 capsule (60 mg total) by mouth daily. - buPROPion (WELLBUTRIN XL) 150 MG 24 hr tablet; Take 1 tablet (150 mg total) by mouth daily. Take with a 300mg  tablet for total daily dose of 450mg   4. Moderate persistent asthma without complication Stable. Cont current regime. dulera being changed to aeroduo due to insurance preference  Other orders - ALPRAZolam (XANAX) 1 MG tablet; Take 1 tablet (1 mg total) by mouth 2 (two) times daily as needed for anxiety. - Fluticasone-Salmeterol 232-14 MCG/ACT AEPB; Inhale 1 puff into the lungs in the morning and at bedtime. - buPROPion (WELLBUTRIN XL) 300 MG 24 hr tablet; Take 1 tablet (300 mg total) by mouth daily. Take with a 150mg  tablet for total daily dose of 450mg   Return in about 6 months (around 05/21/2020).    , MD Primary [facility] at [facility] [address] [place], Myles Lipps [facility] Ph.  [phone] Fax [phone]

## 2019-11-19 NOTE — Patient Instructions (Signed)
° ° ° °  If you have lab work done today you will be contacted with your lab results within the next 2 weeks.  If you have not heard from us then please contact us. The fastest way to get your results is to register for My Chart. ° ° °IF you received an x-ray today, you will receive an invoice from [facility]. Please contact [facility] at [phone] with questions or concerns regarding your invoice.  ° °IF you received labwork today, you will receive an invoice from [facility]. Please contact [facility] at [phone] with questions or concerns regarding your invoice.  ° °Our billing staff will not be able to assist you with questions regarding bills from these companies. ° °You will be contacted with the lab results as soon as they are available. The fastest way to get your results is to activate your My Chart account. Instructions are located on the last page of this paperwork. If you have not heard from us regarding the results in 2 weeks, please contact this office. °  ° ° ° °

## 2019-11-20 LAB — COMPREHENSIVE METABOLIC PANEL
ALT: 19 IU/L (ref 0–32)
AST: 20 IU/L (ref 0–40)
Albumin/Globulin Ratio: 1.5 (ref 1.2–2.2)
Albumin: 4.1 g/dL (ref 3.8–4.8)
Alkaline Phosphatase: 83 IU/L (ref 48–121)
BUN/Creatinine Ratio: 10 (ref 9–23)
BUN: 8 mg/dL (ref 6–24)
Bilirubin Total: 0.4 mg/dL (ref 0.0–1.2)
CO2: 21 mmol/L (ref 20–29)
Calcium: 8.8 mg/dL (ref 8.7–10.2)
Chloride: 103 mmol/L (ref 96–106)
Creatinine, Ser: 0.77 mg/dL (ref 0.57–1.00)
GFR calc Af Amer: 110 mL/min/{1.73_m2} (ref >59)
GFR calc non Af Amer: 96 mL/min/{1.73_m2} (ref >59)
Globulin, Total: 2.7 g/dL (ref 1.5–4.5)
Glucose: 89 mg/dL (ref 65–99)
Potassium: 3.8 mmol/L (ref 3.5–5.2)
Sodium: 140 mmol/L (ref 134–144)
Total Protein: 6.8 g/dL (ref 6.0–8.5)

## 2019-11-20 LAB — LIPID PANEL
Chol/HDL Ratio: 3.2 ratio (ref 0.0–4.4)
Cholesterol, Total: 158 mg/dL (ref 100–199)
HDL: 50 mg/dL (ref >39)
LDL Chol Calc (NIH): 91 mg/dL (ref 0–99)
Triglycerides: 93 mg/dL (ref 0–149)
VLDL Cholesterol Cal: 17 mg/dL (ref 5–40)

## 2019-12-14 ENCOUNTER — Other Ambulatory Visit: Payer: Self-pay

## 2019-12-14 ENCOUNTER — Other Ambulatory Visit: Payer: 59 | Admitting: *Deleted

## 2019-12-14 ENCOUNTER — Ambulatory Visit (HOSPITAL_COMMUNITY): Payer: 59 | Attending: Cardiology

## 2019-12-14 DIAGNOSIS — I5022 Chronic systolic (congestive) heart failure: Secondary | ICD-10-CM

## 2019-12-14 LAB — ECHOCARDIOGRAM COMPLETE
Area-P 1/2: 4.01 cm2
S' Lateral: 3.05 cm

## 2019-12-15 LAB — BASIC METABOLIC PANEL
BUN/Creatinine Ratio: 15 (ref 9–23)
BUN: 12 mg/dL (ref 6–24)
CO2: 25 mmol/L (ref 20–29)
Calcium: 9.2 mg/dL (ref 8.7–10.2)
Chloride: 103 mmol/L (ref 96–106)
Creatinine, Ser: 0.79 mg/dL (ref 0.57–1.00)
GFR calc Af Amer: 107 mL/min/{1.73_m2} (ref >59)
GFR calc non Af Amer: 93 mL/min/{1.73_m2} (ref >59)
Glucose: 100 mg/dL — ABNORMAL HIGH (ref 65–99)
Potassium: 3.9 mmol/L (ref 3.5–5.2)
Sodium: 141 mmol/L (ref 134–144)

## 2019-12-16 NOTE — Progress Notes (Signed)
Cardiology Office Note:    Date:  12/17/2019   ID:  Samantha Romero, DOB Apr 26, 1975, MRN [id number]  PCP:  Myles Lipps, MD  Cardiologist:  Lesleigh Noe, MD   Referring MD: Myles Lipps, MD   Chief Complaint  Patient presents with   Congestive Heart Failure    History of Present Illness:    Samantha Romero is a 44 y.o. female with a hx of acute systolic heart failure (7/20 EF 40% -> 1/21 EF 30%), non-ischemic CM,  Morbid obesity,medication compliance breaches, obstructive sleep apnea,CPAP,bariatric surgery, and asthma.  She feels well.  She denies orthopnea.  She had orthopnea in July after the fourth holiday.  Adjusting diuretic therapy led to improvement.  She denies chest pain.  She is concerned about sweating in social settings.  This is likely multifactorial.  Compliance with CPAP he is 50-50.  She vows to do better.  Past Medical History:  Diagnosis Date   Anxiety    panic attacks   Asthma    CHF (congestive heart failure), NYHA class II, acute, systolic (HCC) 10/06/2018   Depression    Fibromyalgia    Hypertension    no meds currently   No pertinent past medical history    Pneumonia    hx of    Sinus problem     Past Surgical History:  Procedure Laterality Date   ESOPHAGOGASTRODUODENOSCOPY N/A 11/26/2014   Procedure: ESOPHAGOGASTRODUODENOSCOPY (EGD);  Surgeon: Ovidio Kin, MD;  Location: [facility];  Service: General;  Laterality: N/A;   GASTRIC BANDING PORT REVISION  08/24/11   LAPAROSCOPIC GASTRIC BANDING  12/26/2007   LAPAROSCOPIC LYSIS OF ADHESIONS N/A 01/06/2015   Procedure: LAPAROSCOPIC LYSIS OF ADHESIONS;  Surgeon: Luretha Murphy, MD;  Location: [facility];  Service: General;  Laterality: N/A;   LAPAROSCOPIC REPAIR AND REMOVAL OF GASTRIC BAND N/A 01/06/2015   Procedure: LAPAROSCOPIC RESIGHTING OF GASTRIC Band PORT;  Surgeon: Luretha Murphy, MD;  Location: [facility];  Service: General;  Laterality: N/A;   NO PAST SURGERIES       Current Medications: Current Meds  Medication Sig   albuterol (PROAIR HFA) 108 (90 Base) MCG/ACT inhaler Inhale 2 puffs into the lungs every 6 (six) hours as needed for wheezing or shortness of breath.    albuterol (PROVENTIL) (2.5 MG/3ML) 0.083% nebulizer solution Take 3 mLs (2.5 mg total) by nebulization every 6 (six) hours as needed for wheezing or shortness of breath.   ALPRAZolam (XANAX) 1 MG tablet Take 1 tablet (1 mg total) by mouth 2 (two) times daily as needed for anxiety.   azelastine (ASTELIN) 0.1 % nasal spray Place 2 sprays into both nostrils 2 (two) times daily.   buPROPion (WELLBUTRIN XL) 150 MG 24 hr tablet Take 1 tablet (150 mg total) by mouth daily. Take with a 300mg  tablet for total daily dose of 450mg    buPROPion (WELLBUTRIN XL) 300 MG 24 hr tablet Take 1 tablet (300 mg total) by mouth daily. Take with a 150mg  tablet for total daily dose of 450mg    carvedilol (COREG) 25 MG tablet Take 1 tablet (25 mg total) by mouth 2 (two) times daily with a meal.   cetirizine (ZYRTEC) 10 MG tablet Take 10 mg by mouth daily with breakfast.   DULoxetine (CYMBALTA) 60 MG capsule Take 1 capsule (60 mg total) by mouth daily.   fluticasone (FLONASE) 50 MCG/ACT nasal spray Place 1 spray into both nostrils every morning.   Fluticasone-Salmeterol 232-14 MCG/ACT AEPB Inhale 1 puff  into the lungs in the morning and at bedtime.   furosemide (LASIX) 40 MG tablet Take 1 tablet (40 mg total) by mouth 2 (two) times daily.   levalbuterol (XOPENEX HFA) 45 MCG/ACT inhaler Inhale 2 puffs into the lungs every 6 (six) hours as needed for wheezing.   levalbuterol (XOPENEX) 1.25 MG/3ML nebulizer solution Take 1.25 mg by nebulization every 6 (six) hours as needed for wheezing.   montelukast (SINGULAIR) 10 MG tablet TAKE 1 TABLET BY MOUTH EVERYDAY AT BEDTIME   omeprazole (PRILOSEC) 40 MG capsule TAKE 1 CAPSULE BY MOUTH EVERY DAY   sacubitril-valsartan (ENTRESTO) 97-103 MG Take 1 tablet by mouth  2 (two) times daily.   spironolactone (ALDACTONE) 25 MG tablet Take 1 tablet (25 mg total) by mouth daily.   [DISCONTINUED] mometasone-formoterol (DULERA) 200-5 MCG/ACT AERO Inhale 2 puffs into the lungs 2 (two) times a day.     Allergies:   Grass extracts [gramineae pollens] and Shellfish allergy   Social History   Socioeconomic History   Marital status: Single    Spouse name: Not on file   Number of children: Not on file   Years of education: Not on file   Highest education level: Not on file  Occupational History   Not on file  Tobacco Use   Smoking status: Never Smoker   Smokeless tobacco: Never Used  Vaping Use   Vaping Use: Never used  Substance and Sexual Activity   Alcohol use: Yes    Comment: socially   Drug use: Never   Sexual activity: Not Currently    Birth control/protection: I.U.D.    Comment: Mirena  Other Topics Concern   Not on file  Social History Narrative   Not on file   Social Determinants of Health   Financial Resource Strain:    Difficulty of Paying Living Expenses: Not on file  Food Insecurity:    Worried About Programme researcher, broadcasting/film/video in the Last Year: Not on file   [employer] of Food in the Last Year: Not on file  Transportation Needs:    Lack of Transportation (Medical): Not on file   Lack of Transportation (Non-Medical): Not on file  Physical Activity:    Days of Exercise per Week: Not on file   Minutes of Exercise per Session: Not on file  Stress:    Feeling of Stress : Not on file  Social Connections:    Frequency of Communication with Friends and Family: Not on file   Frequency of Social Gatherings with Friends and Family: Not on file   Attends Religious Services: Not on file   Active Member of Clubs or Organizations: Not on file   Attends Banker Meetings: Not on file   Marital Status: Not on file     Family History: The patient's family history includes Cancer in her maternal grandfather and  maternal uncle.  ROS:   Please see the history of present illness.    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  Anxiety concerning COVID-19 status and potential infection.  All other systems reviewed and are negative.  EKGs/Labs/Other Studies Reviewed:    The following studies were reviewed today:  ECHOCARDIOGRAM 12/14/2019: IMPRESSIONS   On personal review, I believe the LVEF is greater than 40%.  Date of review 12/16/2019  1. Poor endocardial borders preclude accurate assessment of EF and wall  motion. Left ventricular endocardial border not optimally defined to  evaluate regional wall motion. There is mild concentric left ventricular  hypertrophy. Left ventricular  diastolic  parameters were normal.  2. Right ventricular systolic function is normal. The right ventricular  size is normal. Tricuspid regurgitation signal is inadequate for assessing  PA pressure.  3. The mitral valve is normal in structure. Trivial mitral valve  regurgitation. No evidence of mitral stenosis.  4. The aortic valve is normal in structure. Aortic valve regurgitation is  not visualized. No aortic stenosis is present.  5. The inferior vena cava is normal in size with greater than 50%  respiratory variability, suggesting right atrial pressure of 3 mmHg.  6. Recommend repeat limited echo to assess LVF and wall motion.   EKG:  EKG no new tracing  Recent Labs: 05/07/2019: NT-Pro BNP 79 08/17/2019: TSH 1.230 11/19/2019: ALT 19 12/14/2019: BUN 12; Creatinine, Ser 0.79; Potassium 3.9; Sodium 141  Recent Lipid Panel    Component Value Date/Time   CHOL 158 11/19/2019 1217   TRIG 93 11/19/2019 1217   HDL 50 11/19/2019 1217   CHOLHDL 3.2 11/19/2019 1217   LDLCALC 91 11/19/2019 1217    Physical Exam:    VS:  BP 136/88    Pulse 100    Ht 5\' 2"  (1.575 m)    Wt (!) 314 lb (142.4 kg)    SpO2 98%    BMI 57.43 kg/m     Wt Readings from Last 3 Encounters:  12/17/19 (!) 314 lb (142.4 kg)  11/19/19 (!) 323 lb 12.8  oz (146.9 kg)  11/07/19 (!) 316 lb (143.3 kg)     GEN: Morbid obesity. No acute distress HEENT: Normal NECK: No JVD. LYMPHATICS: No lymphadenopathy CARDIAC:  RRR without murmur, gallop, or edema. VASCULAR:  Normal Pulses. No bruits. RESPIRATORY:  Clear to auscultation without rales, wheezing or rhonchi  ABDOMEN: Soft, non-tender, non-distended, No pulsatile mass, MUSCULOSKELETAL: No deformity  SKIN: Warm and dry NEUROLOGIC:  Alert and oriented x 3 PSYCHIATRIC:  Normal affect   ASSESSMENT:    1. Chronic systolic heart failure (HCC)   2. Essential hypertension   3. Morbid obesity (HCC)   4. OSA on CPAP   5. Class 3 severe obesity due to excess calories without serious comorbidity with body mass index (BMI) of 45.0 to 49.9 in adult (HCC)   6. Nonischemic cardiomyopathy (HCC)   7. Educated about COVID-19 virus infection    PLAN:    In order of problems listed above:  1. Continue carvedilol, Entresto, and spironolactone.  Add Farxiga 10 mg/day.  Basic metabolic panel in 10 days. 2. Adequate blood pressure control on the medications as listed above including furosemide 40 mg twice daily. 3. Encouraged 150 minutes of moderate activity per week.  Start slowing gradually improve endurance. 4. Strongly encouraged 100% compliance with CPAP. 5. Decrease caloric intake 6. No new data 7. Vaccinated and may be a candidate for booster due to obesity.  Continue to mask and practice mitigation.  Guideline directed therapy for left ventricular systolic dysfunction: Angiotensin receptor-neprilysin inhibitor (ARNI)-Entresto; beta-blocker therapy - carvedilol, metoprolol succinate, or bisoprolol; mineralocorticoid receptor antagonist (MRA) therapy -spironolactone or eplerenone.  SGLT-2 agents -  Dapagliflozin 01/07/20) or Empagliflozin (Jardiance).These therapies have been shown to improve clinical outcomes including reduction of rehospitalization, survival, and acute heart  failure.    Medication Adjustments/Labs and Tests Ordered: Current medicines are reviewed at length with the patient today.  Concerns regarding medicines are outlined above.  Orders Placed This Encounter  Procedures   Basic metabolic panel   Meds ordered this encounter  Medications   dapagliflozin propanediol (FARXIGA) 10  MG TABS tablet    Sig: Take 1 tablet (10 mg total) by mouth daily before breakfast.    Dispense:  30 tablet    Refill:  11    Patient Instructions  Medication Instructions:  1) START Farxiga 10mg  once daily  *If you need a refill on your cardiac medications before your next appointment, please call your pharmacy*   Lab Work: BMET today  If you have labs (blood work) drawn today and your tests are completely normal, you will receive your results only by:  MyChart Message (if you have MyChart) OR  A paper copy in the mail If you have any lab test that is abnormal or we need to change your treatment, we will call you to review the results.   Testing/Procedures: None   Follow-Up: At [facility], you and your health needs are our priority.  As part of our continuing mission to provide you with exceptional heart care, we have created designated Provider Care Teams.  These Care Teams include your primary Cardiologist (physician) and Advanced Practice Providers (APPs -  Physician Assistants and Nurse Practitioners) who all work together to provide you with the care you need, when you need it.  We recommend signing up for the patient portal called "MyChart".  Sign up information is provided on this After Visit Summary.  MyChart is used to connect with patients for Virtual Visits (Telemedicine).  Patients are able to view lab/test results, encounter notes, upcoming appointments, etc.  Non-urgent messages can be sent to your provider as well.   To learn more about what you can do with MyChart, go to [facility].    Your next appointment:   4  month(s)  The format for your next appointment:   In Person  Provider:   You may see [email or web address], MD or one of the following Advanced Practice Providers on your designated Care Team:    Lesleigh Noe, NP  Norma Fredrickson, NP  Nada Boozer, NP    Other Instructions      Signed, Georgie Chard, MD  12/17/2019 11:12 AM    [facility]

## 2019-12-17 ENCOUNTER — Other Ambulatory Visit: Payer: Self-pay

## 2019-12-17 ENCOUNTER — Encounter: Payer: Self-pay | Admitting: *Deleted

## 2019-12-17 ENCOUNTER — Ambulatory Visit (INDEPENDENT_AMBULATORY_CARE_PROVIDER_SITE_OTHER): Payer: 59 | Admitting: Interventional Cardiology

## 2019-12-17 ENCOUNTER — Encounter: Payer: Self-pay | Admitting: Interventional Cardiology

## 2019-12-17 VITALS — BP 136/88 | HR 100 | Ht 62.0 in | Wt 314.0 lb

## 2019-12-17 DIAGNOSIS — I5022 Chronic systolic (congestive) heart failure: Secondary | ICD-10-CM

## 2019-12-17 DIAGNOSIS — I428 Other cardiomyopathies: Secondary | ICD-10-CM

## 2019-12-17 DIAGNOSIS — I10 Essential (primary) hypertension: Secondary | ICD-10-CM

## 2019-12-17 DIAGNOSIS — Z9989 Dependence on other enabling machines and devices: Secondary | ICD-10-CM

## 2019-12-17 DIAGNOSIS — Z7189 Other specified counseling: Secondary | ICD-10-CM

## 2019-12-17 DIAGNOSIS — G4733 Obstructive sleep apnea (adult) (pediatric): Secondary | ICD-10-CM | POA: Diagnosis not present

## 2019-12-17 DIAGNOSIS — Z6841 Body Mass Index (BMI) 40.0 and over, adult: Secondary | ICD-10-CM

## 2019-12-17 MED ORDER — DAPAGLIFLOZIN PROPANEDIOL 10 MG PO TABS: 10.0000 mg | ORAL_TABLET | Freq: Every day | ORAL | 11 refills | Status: DC

## 2019-12-17 NOTE — Patient Instructions (Signed)
Medication Instructions:  1) START Farxiga 10mg  once daily  *If you need a refill on your cardiac medications before your next appointment, please call your pharmacy*   Lab Work: BMET today  If you have labs (blood work) drawn today and your tests are completely normal, you will receive your results only by: MyChart Message (if you have MyChart) OR . A paper copy in the mail If you have any lab test that is abnormal or we need to change your treatment, we will call you to review the results.   Testing/Procedures: None   Follow-Up: At [facility], you and your health needs are our priority.  As part of our continuing mission to provide you with exceptional heart care, we have created designated Provider Care Teams.  These Care Teams include your primary Cardiologist (physician) and Advanced Practice Providers (APPs -  Physician Assistants and Nurse Practitioners) who all work together to provide you with the care you need, when you need it.  We recommend signing up for the patient portal called "MyChart".  Sign up information is provided on this After Visit Summary.  MyChart is used to connect with patients for Virtual Visits (Telemedicine).  Patients are able to view lab/test results, encounter notes, upcoming appointments, etc.  Non-urgent messages can be sent to your provider as well.   To learn more about what you can do with MyChart, go to [facility].    Your next appointment:   4 month(s)  The format for your next appointment:   In Person  Provider:   You may see [email or web address], MD or one of the following Advanced Practice Providers on your designated Care Team:    Lesleigh Noe, NP  Norma Fredrickson, NP  Nada Boozer, NP    Other Instructions

## 2019-12-21 ENCOUNTER — Telehealth (HOSPITAL_COMMUNITY): Payer: Self-pay | Admitting: Interventional Cardiology

## 2019-12-21 NOTE — Telephone Encounter (Signed)
Patient needs to come back in the office for repeat echocardiogram and [place] DEFINITY per Evon Slack. I have attempted to contact patient on the dates below and left a message to call the office:  12/20/2019 04:12pm 12/21/2019 @ 9:27 am  If patient calls back please schedule.

## 2019-12-27 ENCOUNTER — Other Ambulatory Visit: Payer: 59

## 2020-01-05 ENCOUNTER — Other Ambulatory Visit: Payer: Self-pay | Admitting: Family Medicine

## 2020-01-23 ENCOUNTER — Other Ambulatory Visit: Payer: Self-pay | Admitting: *Deleted

## 2020-01-23 ENCOUNTER — Encounter: Payer: Self-pay | Admitting: *Deleted

## 2020-01-23 MED ORDER — DAPAGLIFLOZIN PROPANEDIOL 10 MG PO TABS
10.0000 mg | ORAL_TABLET | Freq: Every day | ORAL | 11 refills | Status: DC
Start: 2020-01-23 — End: 2020-02-19

## 2020-01-26 ENCOUNTER — Ambulatory Visit: Payer: 59

## 2020-02-12 ENCOUNTER — Telehealth: Payer: Self-pay

## 2020-02-12 NOTE — Telephone Encounter (Signed)
**Note De-Identified Torre Schaumburg Obfuscation** I started a [place] PA through covermymeds: Key: [id number]

## 2020-02-18 NOTE — Telephone Encounter (Signed)
**Note De-Identified Anais Denslow Obfuscation** Letter received from [facility] stating that they have denied the pts [place]. Reason: Pt must first try and fail Jardiance prior to taking [place] as [place] is the preferred med on the pts plan.  I will forward to Dr Katrinka Blazing and his nurse for advisement to the pt.

## 2020-02-19 MED ORDER — EMPAGLIFLOZIN 10 MG PO TABS: 10.0000 mg | ORAL_TABLET | Freq: Every day | ORAL | 3 refills | Status: DC

## 2020-02-19 NOTE — Telephone Encounter (Deleted)
**Note De-Identified Samantha Romero Obfuscation** Dr Lonn [place] nurse has advised the pt of this change from [place] to [place] [facility] [place] her [facility] message

## 2020-02-19 NOTE — Telephone Encounter (Signed)
**Note De-Identified Samantha Romero Obfuscation** I have notified the pt of this switch from [place] to [place] and the reason change was made Samantha Romero her [facility] message.  I have e-scribed her Jardiance 10 mg RX # 30 with 3 refills to [facility] to fill.

## 2020-02-19 NOTE — Telephone Encounter (Signed)
Start [place].  10 mg/day.  Will eventually increase to 25 mg/day if she tolerates the initial dose.

## 2020-04-06 NOTE — Progress Notes (Signed)
Cardiology Office Note:    Date:  04/07/2020   ID:  [name], DOB 1975/07/02, MRN [id number]  PCP:  Samantha Romero, Samantha Coffee, MD  Cardiologist:  Lesleigh Noe, MD   Referring MD: Samantha Romero, Samantha Romero, *   Chief Complaint  Patient presents with  . Congestive Heart Failure    History of Present Illness:    Samantha HASEGAWA is a 45 y.o. female with a hx of acute systolic heart failure (7/20 EF 40% -> 1/21 EF 30%), non-ischemic CM,  morbid obesity,medication compliance breaches, obstructive sleep apnea,CPAP,bariatric surgery, anxiety & depression, and asthma.  Compliant with CPAP and her medications.  She feels well.  She has decreased stress in her life by getting the job that she was on and currently is doing Financial controller.  Denies orthopnea, PND, excessive dyspnea on exertion, and leg swelling.  No chest pain.  Past Medical History:  Diagnosis Date  . Anxiety    panic attacks  . Asthma   . CHF (congestive heart failure), NYHA class II, acute, systolic (HCC) 10/06/2018  . Depression   . Fibromyalgia   . Hypertension    no meds currently  . No pertinent past medical history   . Pneumonia    hx of   . Sinus problem     Past Surgical History:  Procedure Laterality Date  . ESOPHAGOGASTRODUODENOSCOPY N/A 11/26/2014   Procedure: ESOPHAGOGASTRODUODENOSCOPY (EGD);  Surgeon: Romero Kin, MD;  Location: [facility];  Service: General;  Laterality: N/A;  . GASTRIC BANDING PORT REVISION  08/24/11  . LAPAROSCOPIC GASTRIC BANDING  12/26/2007  . LAPAROSCOPIC LYSIS OF ADHESIONS N/A 01/06/2015   Procedure: LAPAROSCOPIC LYSIS OF ADHESIONS;  Surgeon: Samantha Murphy, MD;  Location: [facility];  Service: General;  Laterality: N/A;  . LAPAROSCOPIC REPAIR AND REMOVAL OF GASTRIC BAND N/A 01/06/2015   Procedure: LAPAROSCOPIC RESIGHTING OF GASTRIC Band PORT;  Surgeon: Samantha Murphy, MD;  Location: [facility];  Service: General;  Laterality: N/A;  . NO PAST SURGERIES      Current  Medications: Current Meds  Medication Sig  . albuterol (PROAIR HFA) 108 (90 Base) MCG/ACT inhaler Inhale 2 puffs into the lungs every 6 (six) hours as needed for wheezing or shortness of breath.   Marland Kitchen albuterol (PROVENTIL) (2.5 MG/3ML) 0.083% nebulizer solution Take 3 mLs (2.5 mg total) by nebulization every 6 (six) hours as needed for wheezing or shortness of breath.  . ALPRAZolam (XANAX) 1 MG tablet Take 1 tablet (1 mg total) by mouth 2 (two) times daily as needed for anxiety.  Marland Kitchen azelastine (ASTELIN) 0.1 % nasal spray Place 2 sprays into both nostrils 2 (two) times daily.  Marland Kitchen buPROPion (WELLBUTRIN XL) 150 MG 24 hr tablet Take 1 tablet (150 mg total) by mouth daily. Take with a 300mg  tablet for total daily dose of 450mg   . buPROPion (WELLBUTRIN XL) 300 MG 24 hr tablet Take 1 tablet (300 mg total) by mouth daily. Take with a 150mg  tablet for total daily dose of 450mg   . carvedilol (COREG) 25 MG tablet Take 1 tablet (25 mg total) by mouth 2 (two) times daily with a meal.  . DULoxetine (CYMBALTA) 60 MG capsule Take 1 capsule (60 mg total) by mouth daily.  . empagliflozin (JARDIANCE) 10 MG TABS tablet Take 1 tablet (10 mg total) by mouth daily before breakfast.  . fluticasone (FLONASE) 50 MCG/ACT nasal spray Place 1 spray into both nostrils every morning.  . Fluticasone-Salmeterol 232-14 MCG/ACT AEPB Inhale 1 puff  into the lungs in the morning and at bedtime.  . furosemide (LASIX) 40 MG tablet Take 1 tablet (40 mg total) by mouth 2 (two) times daily.  Marland Kitchen levalbuterol (XOPENEX HFA) 45 MCG/ACT inhaler Inhale 2 puffs into the lungs every 6 (six) hours as needed for wheezing.  . levalbuterol (XOPENEX) 1.25 MG/3ML nebulizer solution Take 1.25 mg by nebulization every 6 (six) hours as needed for wheezing.  Marland Kitchen levocetirizine (XYZAL) 5 MG tablet Take 5 mg by mouth every evening.  . montelukast (SINGULAIR) 10 MG tablet TAKE 1 TABLET BY MOUTH EVERYDAY AT BEDTIME  . omeprazole (PRILOSEC) 40 MG capsule TAKE 1 CAPSULE  BY MOUTH EVERY DAY  . sacubitril-valsartan (ENTRESTO) 97-103 MG Take 1 tablet by mouth 2 (two) times daily.  Marland Kitchen spironolactone (ALDACTONE) 25 MG tablet Take 1 tablet (25 mg total) by mouth daily.     Allergies:   Grass extracts [gramineae pollens] and Shellfish allergy   Social History   Socioeconomic History  . Marital status: Single    Spouse name: Not on file  . Number of children: Not on file  . Years of education: Not on file  . Highest education level: Not on file  Occupational History  . Not on file  Tobacco Use  . Smoking status: Never Smoker  . Smokeless tobacco: Never Used  Vaping Use  . Vaping Use: Never used  Substance and Sexual Activity  . Alcohol use: Yes    Comment: socially  . Drug use: Never  . Sexual activity: Not Currently    Birth control/protection: I.U.D.    Comment: Mirena  Other Topics Concern  . Not on file  Social History Narrative  . Not on file   Social Determinants of Health   Financial Resource Strain: Not on file  Food Insecurity: Not on file  Transportation Needs: Not on file  Physical Activity: Not on file  Stress: Not on file  Social Connections: Not on file     Family History: The patient's family history includes Cancer in her maternal grandfather and maternal uncle.  ROS:   Please see the history of present illness.    No major complaints all other systems reviewed and are negative.  EKGs/Labs/Other Studies Reviewed:    The following studies were reviewed today: No new data  EKG:  EKG not repeated  Recent Labs: 05/07/2019: NT-Pro BNP 79 08/17/2019: TSH 1.230 11/19/2019: ALT 19 12/14/2019: BUN 12; Creatinine, Ser 0.79; Potassium 3.9; Sodium 141  Recent Lipid Panel    Component Value Date/Time   CHOL 158 11/19/2019 1217   TRIG 93 11/19/2019 1217   HDL 50 11/19/2019 1217   CHOLHDL 3.2 11/19/2019 1217   LDLCALC 91 11/19/2019 1217    Physical Exam:    VS:  BP 136/88   Pulse 74   Ht 5\' 1"  (1.549 m)   Wt (!) 317 lb  (143.8 kg)   SpO2 97%   BMI 59.90 kg/m     Wt Readings from Last 3 Encounters:  04/07/20 (!) 317 lb (143.8 kg)  12/17/19 (!) 314 lb (142.4 kg)  11/19/19 (!) 323 lb 12.8 oz (146.9 kg)     GEN: Morbid obesity. No acute distress HEENT: Normal NECK: No JVD. LYMPHATICS: No lymphadenopathy CARDIAC: No murmur. RRR no gallop, or edema. VASCULAR:  Normal Pulses. No bruits. RESPIRATORY:  Clear to auscultation without rales, wheezing or rhonchi  ABDOMEN: Soft, non-tender, non-distended, No pulsatile mass, MUSCULOSKELETAL: No deformity  SKIN: Warm and dry NEUROLOGIC:  Alert and oriented  x 3 PSYCHIATRIC:  Normal affect   ASSESSMENT:    1. Chronic systolic heart failure (HCC)   2. Morbid obesity (HCC)   3. Essential hypertension   4. OSA on CPAP   5. Educated about COVID-19 virus infection    PLAN:    In order of problems listed above:  1. On 4 pillar therapy (dapagliflozin/Entresto/carvedilol/spironolactone).  Female will be checked today. 2. We discussed physical activity to decrease weight. 3. Blood pressure 1 max dose heart failure regimen is reasonable. 4. Continue compliance with CPAP to decrease stress on heart and impact on blood pressure. 5. With her job because of exposure risk.  Guideline directed therapy for left ventricular systolic dysfunction: Angiotensin receptor-neprilysin inhibitor (ARNI)-Entresto; beta-blocker therapy - carvedilol, metoprolol succinate, or bisoprolol; mineralocorticoid receptor antagonist (MRA) therapy -spironolactone or eplerenone.  SGLT-2 agents -  Dapagliflozin Marcelline Deist) or Empagliflozin (Jardiance).These therapies have been shown to improve clinical outcomes including reduction of rehospitalization, survival, and acute heart failure.    Medication Adjustments/Labs and Tests Ordered: Current medicines are reviewed at length with the patient today.  Concerns regarding medicines are outlined above.  Orders Placed This Encounter  Procedures  .  Basic metabolic panel   No orders of the defined types were placed in this encounter.   Patient Instructions  Medication Instructions:  Your physician recommends that you continue on your current medications as directed. Please refer to the Current Medication list given to you today.  *If you need a refill on your cardiac medications before your next appointment, please call your pharmacy*   Lab Work: BMET today  If you have labs (blood work) drawn today and your tests are completely normal, you will receive your results only by: Marland Kitchen MyChart Message (if you have MyChart) OR . A paper copy in the mail If you have any lab test that is abnormal or we need to change your treatment, we will call you to review the results.   Testing/Procedures: None   Follow-Up: At [facility], you and your health needs are our priority.  As part of our continuing mission to provide you with exceptional heart care, we have created designated Provider Care Teams.  These Care Teams include your primary Cardiologist (physician) and Advanced Practice Providers (APPs -  Physician Assistants and Nurse Practitioners) who all work together to provide you with the care you need, when you need it.  We recommend signing up for the patient portal called "MyChart".  Sign up information is provided on this After Visit Summary.  MyChart is used to connect with patients for Virtual Visits (Telemedicine).  Patients are able to view lab/test results, encounter notes, upcoming appointments, etc.  Non-urgent messages can be sent to your provider as well.   To learn more about what you can do with MyChart, go to [email or web address].    Your next appointment:   6 month(s)  The format for your next appointment:   In Person  Provider:   You may see Lesleigh Noe, MD or one of the following Advanced Practice Providers on your designated Care Team:    Norma Fredrickson, NP  Nada Boozer, NP  Georgie Chard,  NP    Other Instructions      Signed, Lesleigh Noe, MD  04/07/2020 10:03 AM    [facility]

## 2020-04-07 ENCOUNTER — Ambulatory Visit (INDEPENDENT_AMBULATORY_CARE_PROVIDER_SITE_OTHER): Payer: 59 | Admitting: Interventional Cardiology

## 2020-04-07 ENCOUNTER — Other Ambulatory Visit: Payer: Self-pay

## 2020-04-07 ENCOUNTER — Encounter: Payer: Self-pay | Admitting: Interventional Cardiology

## 2020-04-07 VITALS — BP 136/88 | HR 74 | Ht 61.0 in | Wt 317.0 lb

## 2020-04-07 DIAGNOSIS — I5022 Chronic systolic (congestive) heart failure: Secondary | ICD-10-CM | POA: Diagnosis not present

## 2020-04-07 DIAGNOSIS — I10 Essential (primary) hypertension: Secondary | ICD-10-CM | POA: Diagnosis not present

## 2020-04-07 DIAGNOSIS — I5021 Acute systolic (congestive) heart failure: Secondary | ICD-10-CM

## 2020-04-07 DIAGNOSIS — Z9989 Dependence on other enabling machines and devices: Secondary | ICD-10-CM

## 2020-04-07 DIAGNOSIS — G4733 Obstructive sleep apnea (adult) (pediatric): Secondary | ICD-10-CM

## 2020-04-07 DIAGNOSIS — Z7189 Other specified counseling: Secondary | ICD-10-CM

## 2020-04-07 LAB — BASIC METABOLIC PANEL
BUN/Creatinine Ratio: 11 (ref 9–23)
BUN: 9 mg/dL (ref 6–24)
CO2: 21 mmol/L (ref 20–29)
Calcium: 9.1 mg/dL (ref 8.7–10.2)
Chloride: 102 mmol/L (ref 96–106)
Creatinine, Ser: 0.8 mg/dL (ref 0.57–1.00)
GFR calc Af Amer: 104 mL/min/{1.73_m2} (ref >59)
GFR calc non Af Amer: 91 mL/min/{1.73_m2} (ref >59)
Glucose: 125 mg/dL — ABNORMAL HIGH (ref 65–99)
Potassium: 3.9 mmol/L (ref 3.5–5.2)
Sodium: 139 mmol/L (ref 134–144)

## 2020-04-07 NOTE — Addendum Note (Signed)
Addended by: Tonita Phoenix on: 04/07/2020 10:05 AM   Modules accepted: Orders

## 2020-04-07 NOTE — Patient Instructions (Signed)
Medication Instructions:  Your physician recommends that you continue on your current medications as directed. Please refer to the Current Medication list given to you today.  *If you need a refill on your cardiac medications before your next appointment, please call your pharmacy*   Lab Work: BMET today  If you have labs (blood work) drawn today and your tests are completely normal, you will receive your results only by: . MyChart Message (if you have MyChart) OR . A paper copy in the mail If you have any lab test that is abnormal or we need to change your treatment, we will call you to review the results.   Testing/Procedures: None   Follow-Up: At [facility], you and your health needs are our priority.  As part of our continuing mission to provide you with exceptional heart care, we have created designated Provider Care Teams.  These Care Teams include your primary Cardiologist (physician) and Advanced Practice Providers (APPs -  Physician Assistants and Nurse Practitioners) who all work together to provide you with the care you need, when you need it.  We recommend signing up for the patient portal called "MyChart".  Sign up information is provided on this After Visit Summary.  MyChart is used to connect with patients for Virtual Visits (Telemedicine).  Patients are able to view lab/test results, encounter notes, upcoming appointments, etc.  Non-urgent messages can be sent to your provider as well.   To learn more about what you can do with MyChart, go to [email or web address].    Your next appointment:   6 month(s)  The format for your next appointment:   In Person  Provider:   You may see Henry W Smith III, MD or one of the following Advanced Practice Providers on your designated Care Team:    Lori Gerhardt, NP  Laura Ingold, NP  Jill McDaniel, NP    Other Instructions   

## 2020-04-08 ENCOUNTER — Ambulatory Visit (INDEPENDENT_AMBULATORY_CARE_PROVIDER_SITE_OTHER): Payer: 59 | Admitting: Family Medicine

## 2020-04-08 ENCOUNTER — Encounter: Payer: Self-pay | Admitting: Family Medicine

## 2020-04-08 ENCOUNTER — Other Ambulatory Visit: Payer: Self-pay

## 2020-04-08 VITALS — BP 145/94 | HR 103 | Temp 97.0°F | Ht 61.0 in | Wt 317.0 lb

## 2020-04-08 DIAGNOSIS — Z6841 Body Mass Index (BMI) 40.0 and over, adult: Secondary | ICD-10-CM

## 2020-04-08 DIAGNOSIS — J4540 Moderate persistent asthma, uncomplicated: Secondary | ICD-10-CM

## 2020-04-08 DIAGNOSIS — I10 Essential (primary) hypertension: Secondary | ICD-10-CM

## 2020-04-08 DIAGNOSIS — K219 Gastro-esophageal reflux disease without esophagitis: Secondary | ICD-10-CM

## 2020-04-08 DIAGNOSIS — I5021 Acute systolic (congestive) heart failure: Secondary | ICD-10-CM

## 2020-04-08 DIAGNOSIS — Z23 Encounter for immunization: Secondary | ICD-10-CM | POA: Diagnosis not present

## 2020-04-08 DIAGNOSIS — F418 Other specified anxiety disorders: Secondary | ICD-10-CM

## 2020-04-08 MED ORDER — BUPROPION HCL ER (XL) 150 MG PO TB24: 450.0000 mg | ORAL_TABLET | Freq: Every day | ORAL | 1 refills | Status: DC

## 2020-04-08 MED ORDER — DULOXETINE HCL 60 MG PO CPEP: 60.0000 mg | ORAL_CAPSULE | Freq: Every day | ORAL | 1 refills | Status: DC

## 2020-04-08 MED ORDER — OMEPRAZOLE 40 MG PO CPDR: DELAYED_RELEASE_CAPSULE | ORAL | 1 refills | Status: DC

## 2020-04-08 MED ORDER — MONTELUKAST SODIUM 10 MG PO TABS: ORAL_TABLET | ORAL | 2 refills | Status: DC

## 2020-04-08 MED ORDER — ALPRAZOLAM 1 MG PO TABS
1.0000 mg | ORAL_TABLET | Freq: Two times a day (BID) | ORAL | 2 refills | Status: DC | PRN
Start: 2020-04-08 — End: 2020-06-19

## 2020-04-08 MED ORDER — LEVALBUTEROL TARTRATE 45 MCG/ACT IN AERO
2.0000 | INHALATION_SPRAY | Freq: Four times a day (QID) | RESPIRATORY_TRACT | 1 refills | Status: DC | PRN
Start: 2020-04-08 — End: 2020-06-19

## 2020-04-08 NOTE — Patient Instructions (Addendum)
  Health Maintenance, Female Adopting a healthy lifestyle and getting preventive care are important in promoting health and wellness. Ask your health care provider about:  The right schedule for you to have regular tests and exams.  Things you can do on your own to prevent diseases and keep yourself healthy. What should I know about diet, weight, and exercise? Eat a healthy diet  Eat a diet that includes plenty of vegetables, fruits, low-fat dairy products, and lean protein.  Do not eat a lot of foods that are high in solid fats, added sugars, or sodium.   Maintain a healthy weight Body mass index (BMI) is used to identify weight problems. It estimates body fat based on height and weight. Your health care provider can help determine your BMI and help you achieve or maintain a healthy weight. Get regular exercise Get regular exercise. This is one of the most important things you can do for your health. Most adults should:  Exercise for at least 150 minutes each week. The exercise should increase your heart rate and make you sweat (moderate-intensity exercise).  Do strengthening exercises at least twice a week. This is in addition to the moderate-intensity exercise.  Spend less time sitting. Even light physical activity can be beneficial. Watch cholesterol and blood lipids Have your blood tested for lipids and cholesterol at 44 years of age, then have this test every 5 years. Have your cholesterol levels checked more often if:  Your lipid or cholesterol levels are high.  You are older than 44 years of age.  You are at high risk for heart disease. What should I know about cancer screening? Depending on your health history and family history, you may need to have cancer screening at various ages. This may include screening for:  Breast cancer.  Cervical cancer.  Colorectal cancer.  Skin cancer.  Lung cancer. What should I know about heart disease, diabetes, and high blood  pressure? Blood pressure and heart disease  High blood pressure causes heart disease and increases the risk of stroke. This is more likely to develop in people who have high blood pressure readings, are of African descent, or are overweight.  Have your blood pressure checked: ? Every 3-5 years if you are 18-39 years of age. ? Every year if you are 40 years old or older. Diabetes Have regular diabetes screenings. This checks your fasting blood sugar level. Have the screening done:  Once every three years after age 40 if you are at a normal weight and have a low risk for diabetes.  More often and at a younger age if you are overweight or have a high risk for diabetes. What should I know about preventing infection? Hepatitis B If you have a higher risk for hepatitis B, you should be screened for this virus. Talk with your health care provider to find out if you are at risk for hepatitis B infection. Hepatitis C Testing is recommended for:  Everyone born from 1945 through 1965.  Anyone with known risk factors for hepatitis C. Sexually transmitted infections (STIs)  Get screened for STIs, including gonorrhea and chlamydia, if: ? You are sexually active and are younger than 44 years of age. ? You are older than 44 years of age and your health care provider tells you that you are at risk for this type of infection. ? Your sexual activity has changed since you were last screened, and you are at increased risk for chlamydia or gonorrhea. Ask your health   care provider if you are at risk.  Ask your health care provider about whether you are at high risk for HIV. Your health care provider may recommend a prescription medicine to help prevent HIV infection. If you choose to take medicine to prevent HIV, you should first get tested for HIV. You should then be tested every 3 months for as long as you are taking the medicine. Pregnancy  If you are about to stop having your period (premenopausal) and  you may become pregnant, seek counseling before you get pregnant.  Take 400 to 800 micrograms (mcg) of folic acid every day if you become pregnant.  Ask for birth control (contraception) if you want to prevent pregnancy. Osteoporosis and menopause Osteoporosis is a disease in which the bones lose minerals and strength with aging. This can result in bone fractures. If you are 65 years old or older, or if you are at risk for osteoporosis and fractures, ask your health care provider if you should:  Be screened for bone loss.  Take a calcium or vitamin D supplement to lower your risk of fractures.  Be given hormone replacement therapy (HRT) to treat symptoms of menopause. Follow these instructions at home: Lifestyle  Do not use any products that contain nicotine or tobacco, such as cigarettes, e-cigarettes, and chewing tobacco. If you need help quitting, ask your health care provider.  Do not use street drugs.  Do not share needles.  Ask your health care provider for help if you need support or information about quitting drugs. Alcohol use  Do not drink alcohol if: ? Your health care provider tells you not to drink. ? You are pregnant, may be pregnant, or are planning to become pregnant.  If you drink alcohol: ? Limit how much you use to 0-1 drink a day. ? Limit intake if you are breastfeeding.  Be aware of how much alcohol is in your drink. In the [place] one drink equals one 12 oz bottle of beer (355 mL), one 5 oz glass of wine (148 mL), or one 1 oz glass of hard liquor (44 mL). General instructions  Schedule regular health, dental, and eye exams.  Stay current with your vaccines.  Tell your health care provider if: ? You often feel depressed. ? You have ever been abused or do not feel safe at home. Summary  Adopting a healthy lifestyle and getting preventive care are important in promoting health and wellness.  Follow your health care provider's instructions about healthy  diet, exercising, and getting tested or screened for diseases.  Follow your health care provider's instructions on monitoring your cholesterol and blood pressure. This information is not intended to replace advice given to you by your health care provider. Make sure you discuss any questions you have with your health care provider. Document Revised: 03/08/2018 Document Reviewed: 03/08/2018 Elsevier Patient Education  2021 Elsevier Inc.   If you have lab work done today you will be contacted with your lab results within the next 2 weeks.  If you have not heard from us then please contact us. The fastest way to get your results is to register for My Chart.   IF you received an x-ray today, you will receive an invoice from [facility]. Please contact [facility] at [phone] with questions or concerns regarding your invoice.   IF you received labwork today, you will receive an invoice from [facility]. Please contact [facility] at [phone] with questions or concerns regarding your invoice.   Our billing   staff will not be able to assist you with questions regarding bills from these companies.  You will be contacted with the lab results as soon as they are available. The fastest way to get your results is to activate your My Chart account. Instructions are located on the last page of this paperwork. If you have not heard from us regarding the results in 2 weeks, please contact this office.      

## 2020-04-08 NOTE — Progress Notes (Signed)
1/11/20222:40 PM  Opel [place] Jock [birth year], 45 y.o., female [id number]  Chief Complaint  Patient presents with  . Medication Refill    Wellbutrin had been prescribed 450 mg , but pharmacy didn't allow it , may need to re-write for one 150 mg 3 times daily for approval    HPI:   Patient is a 45 y.o. 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 06/05/20 last OV 04/07/20 Depression/Anxiety: Cymbalta and Wellbutrin and Alprazolam: counseling was able to find Asthma: [facility]  Health Maintenance  Topic Date Due  . PAP SMEAR-Modifier  02/27/2019  . INFLUENZA VACCINE  10/28/2019  . Hepatitis C Screening  11/15/2020 (Originally September 20, 1975)  . TETANUS/TDAP  08/16/2029  . COVID-19 Vaccine  Completed  . HIV Screening  Completed     Depression screen [facility] 2/9 04/08/2020 07/24/2019 04/10/2019  Decreased Interest 1 0 0  Down, Depressed, Hopeless 3 0 0  PHQ - 2 Score 4 0 0  Altered sleeping 3 - -  Tired, decreased energy 3 - -  Change in appetite 3 - -  Feeling bad or failure about yourself  2 - -  Trouble concentrating 3 - -  Moving slowly or fidgety/restless 1 - -  Suicidal thoughts 0 - -  PHQ-9 Score 19 - -  Difficult doing work/chores Extremely dIfficult - -    Fall Risk  04/08/2020 08/17/2019 07/24/2019 04/10/2019 11/14/2018  Falls in the past year? 0 0 0 0 0  Number falls in past yr: 0 0 - 0 0  Injury with Fall? 0 0 - 0 0  Follow up Falls evaluation completed - Falls evaluation completed - -     Allergies  Allergen Reactions  . Grass Extracts [Gramineae Pollens]     Nasal congestion,  post nasal drip   . Shellfish Allergy Itching    In throat only.    Prior to Admission medications   Medication Sig Start Date End Date Taking? Authorizing Provider  albuterol (PROAIR HFA) 108 (90 Base) MCG/ACT inhaler Inhale 2 puffs into the lungs every 6 (six) hours as needed for wheezing or shortness of breath.     [provider]  albuterol (PROVENTIL) (2.5 MG/3ML) 0.083% nebulizer solution Take 3 mLs (2.5 mg total) by nebulization every 6 (six) hours as needed for wheezing or shortness of breath. 08/23/18   11/16/2018, 08/25/18, MD  ALPRAZolam Lezlie Lye) 1 MG tablet Take 1 tablet (1 mg total) by mouth 2 (two) times daily as needed for anxiety. 11/19/19   Prudy Feeler, 11/21/19, MD  azelastine (ASTELIN) 0.1 % nasal spray Place 2 sprays into both nostrils 2 (two) times daily. 10/19/18   Meda Coffee, MD  buPROPion (WELLBUTRIN XL) 150 MG 24 hr tablet Take 1 tablet (150 mg total) by mouth daily. Take with a 300mg  tablet for total daily dose of 450mg  11/19/19 12/19/19  , , MD  buPROPion (WELLBUTRIN XL) 300 MG 24 hr tablet Take 1 tablet (300 mg total) by mouth daily. Take with a 150mg  tablet for total daily dose of 450mg  11/19/19   12/21/19, Lezlie Lye, MD  carvedilol (COREG) 25 MG tablet Take 1 tablet (25 mg total) by mouth 2 (two) times daily with a meal. 05/07/19   Rosalio Macadamia, NP  DULoxetine (CYMBALTA) 60 MG capsule Take 1 capsule (60 mg total) by mouth daily. 11/19/19   Lezlie Lye, Meda Coffee, MD  empagliflozin (JARDIANCE) 10 MG TABS tablet Take 1 tablet (10 mg total) by mouth daily before breakfast. 02/19/20   Lyn Records, MD  fluticasone [place]) 50 MCG/ACT nasal spray Place 1 spray into both nostrils every morning.    [provider]  Fluticasone-Salmeterol 232-14 MCG/ACT AEPB Inhale 1 puff into the lungs in the morning and at bedtime. 11/19/19   Noni Saupe, MD  furosemide (LASIX) 40 MG tablet Take 1 tablet (40 mg total) by mouth 2  (two) times daily. 06/06/19   Filbert Schilder, NP  levalbuterol [facility] HFA) 45 MCG/ACT inhaler Inhale 2 puffs into the lungs every 6 (six) hours as needed for wheezing. 10/19/18   Marcelyn Bruins, MD  levalbuterol Pauline Aus) 1.25 MG/3ML nebulizer solution Take 1.25 mg by nebulization every 6 (six) hours as needed for wheezing. 10/19/18   Marcelyn Bruins, MD  levocetirizine (XYZAL) 5 MG tablet Take 5 mg by mouth every evening.    [provider]  montelukast (SINGULAIR) 10 MG tablet TAKE 1 TABLET BY MOUTH EVERYDAY AT BEDTIME 01/05/20   Lezlie Lye, Meda Coffee, MD  omeprazole (PRILOSEC) 40 MG capsule TAKE 1 CAPSULE BY MOUTH EVERY DAY 08/17/19   Lezlie Lye, Meda Coffee, MD  sacubitril-valsartan (ENTRESTO) 97-103 MG Take 1 tablet by mouth 2 (two) times daily. 06/06/19   Georgie Chard D, NP  spironolactone (ALDACTONE) 25 MG tablet Take 1 tablet (25 mg total) by mouth daily. 06/06/19 12/17/19  Georgie Chard D, NP  mometasone-formoterol (DULERA) 200-5 MCG/ACT AERO Inhale 2 puffs into the lungs 2 (two) times a day. 10/19/18 12/17/19  Marcelyn Bruins, MD    Past Medical History:  Diagnosis Date  . Anxiety    panic attacks  . Asthma   . CHF (congestive heart failure), NYHA class II, acute, systolic (HCC) 10/06/2018  . Depression   . Fibromyalgia   . Hypertension    no meds currently  . No pertinent past medical history   . Pneumonia    hx of   . Sinus problem     Past Surgical History:  Procedure Laterality Date  . ESOPHAGOGASTRODUODENOSCOPY N/A 11/26/2014   Procedure: ESOPHAGOGASTRODUODENOSCOPY (EGD);  Surgeon: Ovidio Kin, MD;  Location: [facility];  Service: General;  Laterality: N/A;  . GASTRIC BANDING PORT REVISION  08/24/11  . LAPAROSCOPIC GASTRIC BANDING  12/26/2007  . LAPAROSCOPIC LYSIS OF ADHESIONS N/A 01/06/2015   Procedure: LAPAROSCOPIC LYSIS OF ADHESIONS;  Surgeon: Luretha Murphy, MD;  Location: [facility];  Service: General;  Laterality: N/A;  .  LAPAROSCOPIC REPAIR AND REMOVAL OF GASTRIC BAND N/A 01/06/2015   Procedure: LAPAROSCOPIC RESIGHTING OF GASTRIC Band PORT;  Surgeon: Luretha Murphy, MD;  Location: [facility];  Service: General;  Laterality: N/A;  . NO PAST SURGERIES      Social History   Tobacco Use  . Smoking status: Never Smoker  . Smokeless tobacco: Never Used  Substance Use Topics  . Alcohol use: Yes    Comment: socially    Family History  Problem Relation Age of Onset  . Cancer Maternal Uncle        lung  . Cancer Maternal Grandfather        lung and  back    Review of Systems  Constitutional: Negative for chills, fever and malaise/fatigue.  Eyes: Negative for blurred vision and double vision.  Respiratory: Negative for cough, shortness of breath and wheezing.   Cardiovascular: Negative for chest pain, palpitations and leg swelling.  Gastrointestinal: Negative for abdominal pain, blood in stool, constipation, diarrhea, heartburn, nausea and vomiting.  Genitourinary: Negative for dysuria, frequency and hematuria.  Musculoskeletal: Negative for back pain and joint pain.  Skin: Negative for rash.  Neurological: Negative for dizziness, weakness and headaches.     OBJECTIVE:  Today's Vitals   04/08/20 1416  BP: (!) 145/94  Pulse: (!) 103  Temp: (!) 97 F (36.1 C)  SpO2: 97%  Weight: (!) 317 lb (143.8 kg)  Height: 5\' 1"  (1.549 m)   Body mass index is 59.9 kg/m.   Physical Exam Constitutional:      General: She is not in acute distress.    Appearance: Normal appearance. She is not ill-appearing.  HENT:     Head: Normocephalic.  Cardiovascular:     Rate and Rhythm: Normal rate and regular rhythm.     Pulses: Normal pulses.     Heart sounds: Normal heart sounds. No murmur heard. No friction rub. No gallop.   Pulmonary:     Effort: Pulmonary effort is normal. No respiratory distress.     Breath sounds: Normal breath sounds. No stridor. No wheezing, rhonchi or rales.  Abdominal:     General:  Bowel sounds are normal.     Palpations: Abdomen is soft.     Tenderness: There is no abdominal tenderness.  Musculoskeletal:     Right lower leg: No edema.     Left lower leg: No edema.  Skin:    General: Skin is warm and dry.  Neurological:     Mental Status: She is alert and oriented to person, place, and time.  Psychiatric:        Mood and Affect: Mood normal.        Behavior: Behavior normal.     No results found for this or any previous visit (from the past 24 hour(s)).  No results found.   ASSESSMENT and PLAN  Problem List Items Addressed This Visit      Cardiovascular and Mediastinum   Essential hypertension - Primary   CHF (congestive heart failure), NYHA class II, acute, systolic (HCC)     Respiratory   Asthma   Relevant Medications   montelukast (SINGULAIR) 10 MG tablet   levalbuterol (XOPENEX HFA) 45 MCG/ACT inhaler     Digestive   GERD (gastroesophageal reflux disease)   Relevant Medications   omeprazole (PRILOSEC) 40 MG capsule     Other   Depression with anxiety   Relevant Medications   buPROPion (WELLBUTRIN XL) 150 MG 24 hr tablet   DULoxetine (CYMBALTA) 60 MG capsule   ALPRAZolam (XANAX) 1 MG tablet    Other Visit Diagnoses    BMI 50.0-59.9, adult (HCC)         Plan Stable on current regimens   Return in about 3 months (around 07/07/2020).    09/06/2020 Ivanna Kocak, FNP-BC Primary Care at [facility] [address] [place], [place] Ph.  [phone] Fax [phone]

## 2020-04-16 DIAGNOSIS — I5021 Acute systolic (congestive) heart failure: Secondary | ICD-10-CM

## 2020-04-16 MED ORDER — ENTRESTO 97-103 MG PO TABS: 1.0000 | ORAL_TABLET | Freq: Two times a day (BID) | ORAL | 6 refills | Status: DC

## 2020-04-16 MED ORDER — FUROSEMIDE 40 MG PO TABS: 40.0000 mg | ORAL_TABLET | Freq: Two times a day (BID) | ORAL | 3 refills | Status: DC

## 2020-04-17 NOTE — Telephone Encounter (Signed)
Called pt.  Samples provided.  She is in contact with insurance company.  She will come by today and pick up meds.

## 2020-05-02 ENCOUNTER — Encounter: Payer: Self-pay | Admitting: Family Medicine

## 2020-05-08 ENCOUNTER — Encounter: Payer: Self-pay | Admitting: Nurse Practitioner

## 2020-05-08 ENCOUNTER — Ambulatory Visit (INDEPENDENT_AMBULATORY_CARE_PROVIDER_SITE_OTHER): Payer: 59 | Admitting: Nurse Practitioner

## 2020-05-08 ENCOUNTER — Other Ambulatory Visit: Payer: Self-pay

## 2020-05-08 VITALS — BP 132/80 | HR 99 | Temp 98.5°F | Ht 61.8 in | Wt 317.8 lb

## 2020-05-08 DIAGNOSIS — R7309 Other abnormal glucose: Secondary | ICD-10-CM

## 2020-05-08 DIAGNOSIS — I10 Essential (primary) hypertension: Secondary | ICD-10-CM | POA: Diagnosis not present

## 2020-05-08 DIAGNOSIS — Z7689 Persons encountering health services in other specified circumstances: Secondary | ICD-10-CM

## 2020-05-08 DIAGNOSIS — I5021 Acute systolic (congestive) heart failure: Secondary | ICD-10-CM | POA: Diagnosis not present

## 2020-05-08 DIAGNOSIS — F419 Anxiety disorder, unspecified: Secondary | ICD-10-CM | POA: Diagnosis not present

## 2020-05-08 DIAGNOSIS — R5383 Other fatigue: Secondary | ICD-10-CM

## 2020-05-08 DIAGNOSIS — Z6841 Body Mass Index (BMI) 40.0 and over, adult: Secondary | ICD-10-CM

## 2020-05-08 NOTE — Progress Notes (Signed)
I,Yamilka Roman [employer] as a Education administrator for [employer], FNP.,have documented all relevant documentation on the behalf of [name], FNP,as directed by  [name], FNP while in the presence of [name], [place]. This visit occurred during the SARS-CoV-2 public health emergency.  Safety protocols were in place, including screening questions prior to the visit, additional usage of staff PPE, and extensive cleaning of exam room while observing appropriate contact time as indicated for disinfecting solutions.  Subjective:     Patient ID: Samantha Romero , female    DOB: [birth year] , 45 y.o.   MRN: [id number]   Chief Complaint  Patient presents with  . Establish Care    HPI  Patient presents today to establish primary care.  She was seeing Dr Pamella Pert at [facility] who is no longer at the practice.  She was referred by Donata Duff who is a patient here. 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 [employer] and [employer] for masters in [employer].  She is in a long term relationship, no children.  She does have a GYN at  [facility] Readings from Last 3 Encounters: 05/08/20 : (!) 317 lb 12.8 oz (144.2 kg) 04/08/20 : (!) 317 lb (143.8 kg) 04/07/20 : (!) 317 lb (143.8 kg)  PMH - asthma, GERD (took prilosec), CHF - [place] since Summer 2020, Anxiety and depression (counselors), HTN - July 2020. Fibromyalgia (Cymbalta/Wellbutrin)  - she had seen a Neurologist at [facility], has been manageable.  She is now on a CPAP for the last 4 months, have not been able to get consistent use due to the mask issues.    Her wellbutrin was increased to 450 mg recommended by Dr. Tamala Julian and Dr. Pamella Pert.   [facility] - mother - HTN.    Past Medical History:  Diagnosis Date  . Anxiety    panic attacks  . Asthma   . CHF (congestive heart failure), NYHA class II, acute, systolic ([place]) 07/14/4079  . Depression   . Fibromyalgia   . Hypertension    no meds currently  . No pertinent past medical history    . Pneumonia    hx of   . Sinus problem      Family History  Problem Relation Age of Onset  . Ulcers Mother   . Hypertension Mother   . Cancer Maternal Uncle        lung  . Cancer Maternal Grandfather        lung and back     Current Outpatient Medications:  .  albuterol (PROVENTIL) (2.5 MG/3ML) 0.083% nebulizer solution, Take 3 mLs (2.5 mg total) by nebulization every 6 (six) hours as needed for wheezing or shortness of breath., Disp: 150 mL, Rfl: 1 .  ALPRAZolam (XANAX) 1 MG tablet, Take 1 tablet (1 mg total) by mouth 2 (two) times daily as needed for anxiety., Disp: 40 tablet, Rfl: 2 .  azelastine (ASTELIN) 0.1 % nasal spray, Place 2 sprays into both nostrils 2 (two) times daily., Disp: 30 mL, Rfl: 5 .  buPROPion (WELLBUTRIN XL) 150 MG 24 hr tablet, Take 3 tablets (450 mg total) by mouth daily. Take with a $Remo'300mg'ifCpH$  tablet for total daily dose of $Remov'450mg'msmkFE$ , Disp: 180 tablet, Rfl: 1 .  carvedilol (COREG) 25 MG tablet, Take 1 tablet (25 mg total) by mouth 2 (two) times daily with a meal., Disp: 180 tablet, Rfl: 3 .  DULoxetine (CYMBALTA) 60 MG capsule, Take 1 capsule (60 mg total) by mouth daily.,  Disp: 90 capsule, Rfl: 1 .  empagliflozin (JARDIANCE) 10 MG TABS tablet, Take 1 tablet (10 mg total) by mouth daily before breakfast., Disp: 30 tablet, Rfl: 3 .  fluticasone (FLONASE) 50 MCG/ACT nasal spray, Place 1 spray into both nostrils every morning., Disp: , Rfl:  .  Fluticasone-Salmeterol 232-14 MCG/ACT AEPB, Inhale 1 puff into the lungs in the morning and at bedtime., Disp: 1 each, Rfl: 5 .  furosemide (LASIX) 40 MG tablet, Take 1 tablet (40 mg total) by mouth 2 (two) times daily., Disp: 180 tablet, Rfl: 3 .  levalbuterol (XOPENEX HFA) 45 MCG/ACT inhaler, Inhale 2 puffs into the lungs every 6 (six) hours as needed for wheezing., Disp: 1 each, Rfl: 1 .  levocetirizine (XYZAL) 5 MG tablet, Take 5 mg by mouth every evening., Disp: , Rfl:  .  montelukast (SINGULAIR) 10 MG tablet, TAKE 1 TABLET  BY MOUTH EVERYDAY AT BEDTIME, Disp: 90 tablet, Rfl: 2 .  omeprazole (PRILOSEC) 40 MG capsule, TAKE 1 CAPSULE BY MOUTH EVERY DAY, Disp: 90 capsule, Rfl: 1 .  sacubitril-valsartan (ENTRESTO) 97-103 MG, Take 1 tablet by mouth 2 (two) times daily., Disp: 60 tablet, Rfl: 6 .  spironolactone (ALDACTONE) 25 MG tablet, Take 1 tablet (25 mg total) by mouth daily., Disp: 90 tablet, Rfl: 3   Allergies  Allergen Reactions  . Grass Extracts [Gramineae Pollens]     Nasal congestion, post nasal drip   . Shellfish Allergy Itching    In throat only.     Review of Systems  Constitutional: Negative.   HENT: Negative.   Eyes: Negative.   Respiratory: Negative.   Cardiovascular: Negative.   Gastrointestinal: Negative.   Endocrine: Negative for polydipsia, polyphagia and polyuria.  Genitourinary: Negative.   Musculoskeletal: Negative.   Skin: Negative.   Neurological: Negative.   Hematological: Negative.   Psychiatric/Behavioral: Negative.      Today's Vitals   05/08/20 1134  BP: 132/80  Pulse: 99  Temp: 98.5 F (36.9 C)  TempSrc: Oral  SpO2: 98%  Weight: (!) 317 lb 12.8 oz (144.2 kg)  Height: 5' 1.8" (1.57 m)  PainSc: 0-No pain   Body mass index is 58.5 kg/m.   Objective:  Physical Exam Constitutional:      General: She is not in acute distress.    Appearance: Normal appearance. She is obese.  Cardiovascular:     Rate and Rhythm: Normal rate and regular rhythm.     Pulses: Normal pulses.     Heart sounds: Normal heart sounds.  Pulmonary:     Effort: Pulmonary effort is normal.     Breath sounds: Normal breath sounds.  Abdominal:     General: Abdomen is flat. Bowel sounds are normal.     Palpations: Abdomen is soft.  Musculoskeletal:        General: Normal range of motion.     Cervical back: Normal range of motion and neck supple.  Skin:    General: Skin is warm and dry.     Capillary Refill: Capillary refill takes less than 2 seconds.  Neurological:     General: No focal  deficit present.     Mental Status: She is alert and oriented to person, place, and time.  Psychiatric:        Mood and Affect: Mood normal.        Behavior: Behavior normal.        Thought Content: Thought content normal.        Judgment: Judgment normal.  Assessment And Plan:     1. Essential hypertension  Chronic, fairly controlled  Continue with current medications - CMP14+EGFR  2. Anxiety  Chronic, she is on wellbutrin 450 mg daily  She does feel her anxiety is improved since changing jobs  3. CHF (congestive heart failure), NYHA class II, acute, systolic (HCC)  She is being followed by Dr. Tamala Julian  She is currently stable  4. Abnormal glucose  Chronic, will check HgbA1c - Hemoglobin A1c - CMP14+EGFR  5. BMI 50.0-59.9, adult (HCC)  Chronic  Discussed healthy diet and regular exercise options   Encouraged to exercise at least 150 minutes per week with 2 days of strength training - Insulin, random(561)  6. Fatigue, unspecified type  Will check metabolic causes  Can also be related to deconditioning - Vitamin B12 - TSH  7. Establishing care with new doctor, encounter for     Patient was given opportunity to ask questions. Patient verbalized understanding of the plan and was able to repeat key elements of the plan. All questions were answered to their satisfaction.  [name], FNP   I, [name], FNP, have reviewed all documentation for this visit. The documentation on 05/08/20 for the exam, diagnosis, procedures, and orders are all accurate and complete.   THE PATIENT IS ENCOURAGED TO PRACTICE SOCIAL DISTANCING DUE TO THE COVID-19 PANDEMIC.

## 2020-05-09 LAB — CMP14+EGFR
ALT: 15 IU/L (ref 0–32)
AST: 22 IU/L (ref 0–40)
Albumin/Globulin Ratio: 1.8 (ref 1.2–2.2)
Albumin: 4.2 g/dL (ref 3.8–4.8)
Alkaline Phosphatase: 73 IU/L (ref 44–121)
BUN/Creatinine Ratio: 16 (ref 9–23)
BUN: 12 mg/dL (ref 6–24)
Bilirubin Total: 0.6 mg/dL (ref 0.0–1.2)
CO2: 21 mmol/L (ref 20–29)
Calcium: 9.4 mg/dL (ref 8.7–10.2)
Chloride: 101 mmol/L (ref 96–106)
Creatinine, Ser: 0.74 mg/dL (ref 0.57–1.00)
GFR calc Af Amer: 115 mL/min/{1.73_m2} (ref >59)
GFR calc non Af Amer: 100 mL/min/{1.73_m2} (ref >59)
Globulin, Total: 2.4 g/dL (ref 1.5–4.5)
Glucose: 76 mg/dL (ref 65–99)
Potassium: 4 mmol/L (ref 3.5–5.2)
Sodium: 139 mmol/L (ref 134–144)
Total Protein: 6.6 g/dL (ref 6.0–8.5)

## 2020-05-09 LAB — INSULIN, RANDOM: INSULIN: 9.9 u[IU]/mL (ref 2.6–24.9)

## 2020-05-09 LAB — HEMOGLOBIN A1C
Est. average glucose Bld gHb Est-mCnc: 126 mg/dL
Hgb A1c MFr Bld: 6 % — ABNORMAL HIGH (ref 4.8–5.6)

## 2020-05-09 LAB — TSH: TSH: 1.47 u[IU]/mL (ref 0.450–4.500)

## 2020-05-09 LAB — VITAMIN B12: Vitamin B-12: 365 pg/mL (ref 232–1245)

## 2020-05-13 ENCOUNTER — Telehealth: Payer: Self-pay | Admitting: Interventional Cardiology

## 2020-05-13 NOTE — Telephone Encounter (Signed)
Spoke with pt and made her aware that samples of Entresto have been placed at the front for her.  Pt verbalized understanding and was appreciative for call.

## 2020-05-13 NOTE — Telephone Encounter (Signed)
  Patient states she is returning a call from [place].

## 2020-05-19 ENCOUNTER — Other Ambulatory Visit: Payer: Self-pay | Admitting: Nurse Practitioner

## 2020-05-19 NOTE — Progress Notes (Signed)
She is already on Jardiance confirm if she is taking this and we can consider adding Ozempic?  Also confirm with her if she is seeing another PCP Macario Carls Just NP), if not update in PCP section to indicate my name. She can come in for weekly vitamin B12 injections for 4 weeks and we can recheck levels at that time this may help her memory

## 2020-06-19 ENCOUNTER — Ambulatory Visit (INDEPENDENT_AMBULATORY_CARE_PROVIDER_SITE_OTHER): Payer: 59 | Admitting: Nurse Practitioner

## 2020-06-19 ENCOUNTER — Encounter: Payer: Self-pay | Admitting: Nurse Practitioner

## 2020-06-19 ENCOUNTER — Other Ambulatory Visit: Payer: Self-pay

## 2020-06-19 VITALS — BP 134/80 | HR 94 | Temp 98.6°F | Ht 60.8 in | Wt 312.6 lb

## 2020-06-19 DIAGNOSIS — I10 Essential (primary) hypertension: Secondary | ICD-10-CM

## 2020-06-19 DIAGNOSIS — R7309 Other abnormal glucose: Secondary | ICD-10-CM

## 2020-06-19 DIAGNOSIS — F418 Other specified anxiety disorders: Secondary | ICD-10-CM | POA: Diagnosis not present

## 2020-06-19 DIAGNOSIS — F419 Anxiety disorder, unspecified: Secondary | ICD-10-CM | POA: Diagnosis not present

## 2020-06-19 DIAGNOSIS — Z6841 Body Mass Index (BMI) 40.0 and over, adult: Secondary | ICD-10-CM

## 2020-06-19 MED ORDER — OZEMPIC (0.25 OR 0.5 MG/DOSE) 2 MG/1.5ML ~~LOC~~ SOPN: 0.5000 mg | PEN_INJECTOR | SUBCUTANEOUS | 1 refills | Status: DC

## 2020-06-19 MED ORDER — BUPROPION HCL ER (XL) 150 MG PO TB24: 450.0000 mg | ORAL_TABLET | Freq: Every day | ORAL | 1 refills | Status: DC

## 2020-06-19 MED ORDER — ALPRAZOLAM 1 MG PO TABS: 1.0000 mg | ORAL_TABLET | Freq: Two times a day (BID) | ORAL | 2 refills | Status: DC | PRN

## 2020-06-19 NOTE — Progress Notes (Signed)
I,Yamilka Roman [employer] as a Neurosurgeon for [employer], FNP.,have documented all relevant documentation on the behalf of [name], FNP,as directed by  [name], FNP while in the presence of [name], FNP. This visit occurred during the SARS-CoV-2 public health emergency.  Safety protocols were in place, including screening questions prior to the visit, additional usage of staff PPE, and extensive cleaning of exam room while observing appropriate contact time as indicated for disinfecting solutions.  Subjective:     Patient ID: Samantha Romero , female    DOB: 24-Sep-1975 , 45 y.o.   MRN: [id number]   Chief Complaint  Patient presents with  . Hypertension    HPI  Patient presents today for a blood pressure f/u.  She has seen the Cardiologist since her last visit - he is managing her blood pressure. She had been having more depression problems since her uncle passed.  She had been taking xanax for her anxiety and to help sleep. She has tried melatonin in the past for sleep.   Wt Readings from Last 3 Encounters: 06/19/20 : (!) 312 lb 9.6 oz (141.8 kg) 05/08/20 : (!) 317 lb 12.8 oz (144.2 kg) 04/08/20 : (!) 317 lb (143.8 kg)  Hypertension This is a chronic problem. The current episode started more than 1 year ago. The problem is unchanged. The problem is controlled. Pertinent negatives include no anxiety or blurred vision. There are no associated agents to hypertension. Risk factors for coronary artery disease include obesity and sedentary lifestyle. Past treatments include angiotensin blockers. There are no compliance problems.  There is no history of angina. There is no history of chronic renal disease.     Past Medical History:  Diagnosis Date  . Anxiety    panic attacks  . Asthma   . CHF (congestive heart failure), NYHA class II, acute, systolic (HCC) 10/06/2018  . Depression   . Fibromyalgia   . Hypertension    no meds currently  . No pertinent past medical history    . Pneumonia    hx of   . Sinus problem      Family History  Problem Relation Age of Onset  . Ulcers Mother   . Hypertension Mother   . Cancer Maternal Uncle        lung  . Cancer Maternal Grandfather        lung and back     Current Outpatient Medications:  .  albuterol (PROVENTIL) (2.5 MG/3ML) 0.083% nebulizer solution, Take 3 mLs (2.5 mg total) by nebulization every 6 (six) hours as needed for wheezing or shortness of breath., Disp: 150 mL, Rfl: 1 .  azelastine (ASTELIN) 0.1 % nasal spray, Place 2 sprays into both nostrils 2 (two) times daily., Disp: 30 mL, Rfl: 5 .  carvedilol (COREG) 25 MG tablet, Take 1 tablet (25 mg total) by mouth 2 (two) times daily with a meal., Disp: 180 tablet, Rfl: 3 .  DULoxetine (CYMBALTA) 60 MG capsule, Take 1 capsule (60 mg total) by mouth daily., Disp: 90 capsule, Rfl: 1 .  empagliflozin (JARDIANCE) 10 MG TABS tablet, Take 1 tablet (10 mg total) by mouth daily before breakfast., Disp: 30 tablet, Rfl: 3 .  fluticasone (FLONASE) 50 MCG/ACT nasal spray, Place 1 spray into both nostrils every morning., Disp: , Rfl:  .  Fluticasone-Salmeterol 232-14 MCG/ACT AEPB, Inhale 1 puff into the lungs in the morning and at bedtime., Disp: 1 each, Rfl: 5 .  furosemide (LASIX) 40 MG tablet, Take 1 tablet (  40 mg total) by mouth 2 (two) times daily., Disp: 180 tablet, Rfl: 3 .  levocetirizine (XYZAL) 5 MG tablet, Take 5 mg by mouth every evening., Disp: , Rfl:  .  montelukast (SINGULAIR) 10 MG tablet, TAKE 1 TABLET BY MOUTH EVERYDAY AT BEDTIME, Disp: 90 tablet, Rfl: 2 .  omeprazole (PRILOSEC) 40 MG capsule, TAKE 1 CAPSULE BY MOUTH EVERY DAY, Disp: 90 capsule, Rfl: 1 .  sacubitril-valsartan (ENTRESTO) 97-103 MG, Take 1 tablet by mouth 2 (two) times daily., Disp: 60 tablet, Rfl: 6 .  Semaglutide,0.25 or 0.5MG /DOS, (OZEMPIC, 0.25 OR 0.5 MG/DOSE,) 2 MG/1.5ML SOPN, Inject 0.5 mg into the skin once a week., Disp: 4.5 mL, Rfl: 1 .  spironolactone (ALDACTONE) 25 MG tablet, Take  1 tablet (25 mg total) by mouth daily., Disp: 90 tablet, Rfl: 3 .  ALPRAZolam (XANAX) 1 MG tablet, Take 1 tablet (1 mg total) by mouth 2 (two) times daily as needed for anxiety., Disp: 40 tablet, Rfl: 2 .  buPROPion (WELLBUTRIN XL) 150 MG 24 hr tablet, Take 3 tablets (450 mg total) by mouth daily., Disp: 180 tablet, Rfl: 1   Allergies  Allergen Reactions  . Grass Extracts [Gramineae Pollens]     Nasal congestion, post nasal drip   . Shellfish Allergy Itching    In throat only.     Review of Systems  Constitutional: Negative.   Eyes: Negative for blurred vision.  Respiratory: Negative.   Cardiovascular: Negative.   Neurological: Negative.   Psychiatric/Behavioral: Negative.      Today's Vitals   06/19/20 0951  BP: 134/80  Pulse: 94  Temp: 98.6 F (37 C)  TempSrc: Oral  Weight: (!) 312 lb 9.6 oz (141.8 kg)  Height: 5' 0.8" (1.544 m)  PainSc: 0-No pain   Body mass index is 59.45 kg/m.   Objective:  Physical Exam Constitutional:      General: She is not in acute distress.    Appearance: Normal appearance. She is obese.  Eyes:     Pupils: Pupils are equal, round, and reactive to light.  Cardiovascular:     Rate and Rhythm: Normal rate and regular rhythm.     Pulses: Normal pulses.     Heart sounds: Normal heart sounds. No murmur heard.   Pulmonary:     Effort: Pulmonary effort is normal. No respiratory distress.     Breath sounds: Normal breath sounds. No wheezing.  Musculoskeletal:     Right lower leg: No edema.     Left lower leg: No edema.  Skin:    General: Skin is warm and dry.     Capillary Refill: Capillary refill takes less than 2 seconds.  Neurological:     General: No focal deficit present.     Mental Status: She is alert and oriented to person, place, and time.     Cranial Nerves: No cranial nerve deficit.  Psychiatric:        Mood and Affect: Mood normal.        Behavior: Behavior normal.        Thought Content: Thought content normal.         Judgment: Judgment normal.         Assessment And Plan:     1. Essential hypertension  Chronic, blood pressure is fairly controlled  Continue with follow up with Cardiology  2. Depression with anxiety  Chronic, she is doing better she had been more down after the loss of her uncle  At some point we will  evaluate if she need an additional medication for her anxiety to get her to not need the alprazolam as often - buPROPion (WELLBUTRIN XL) 150 MG 24 hr tablet; Take 3 tablets (450 mg total) by mouth daily.  Dispense: 180 tablet; Refill: 1 - ALPRAZolam (XANAX) 1 MG tablet; Take 1 tablet (1 mg total) by mouth 2 (two) times daily as needed for anxiety.  Dispense: 40 tablet; Refill: 2  3. Anxiety  Chronic  4. Abnormal glucose  Chronic, she is tolerating metformin well  I will start her on ozempic to help with cardiac protection and may also help with weight loss  Will start Ozempic 0.25 mg for 4 weeks then increase to 0.5 mg for 2 weeks the initial pen will last for 6 weeks.  Discussed side effects to include nausea, difficulty swallowing and abdominal pain. - Semaglutide,0.25 or 0.5MG /DOS, (OZEMPIC, 0.25 OR 0.5 MG/DOSE,) 2 MG/1.5ML SOPN; Inject 0.5 mg into the skin once a week.  Dispense: 4.5 mL; Refill: 1  5. BMI 50.0-59.9, adult (HCC)  Chronic  Discussed healthy diet and regular exercise options   Encouraged to exercise at least 150 minutes per week with 2 days of strength training  Extensive education on weight loss - Semaglutide,0.25 or 0.5MG /DOS, (OZEMPIC, 0.25 OR 0.5 MG/DOSE,) 2 MG/1.5ML SOPN; Inject 0.5 mg into the skin once a week.  Dispense: 4.5 mL; Refill: 1     Patient was given opportunity to ask questions. Patient verbalized understanding of the plan and was able to repeat key elements of the plan. All questions were answered to their satisfaction.  [name], FNP   I, [name], FNP, have reviewed all documentation for this visit. The documentation on  06/19/20 for the exam, diagnosis, procedures, and orders are all accurate and complete.   IF YOU HAVE BEEN REFERRED TO A SPECIALIST, IT MAY TAKE 1-2 WEEKS TO SCHEDULE/PROCESS THE REFERRAL. IF YOU HAVE NOT HEARD FROM US/SPECIALIST IN TWO WEEKS, PLEASE GIVE [place] A CALL AT [phone] X [phone].   THE PATIENT IS ENCOURAGED TO PRACTICE SOCIAL DISTANCING DUE TO THE COVID-19 PANDEMIC.

## 2020-06-19 NOTE — Patient Instructions (Addendum)

## 2020-06-30 ENCOUNTER — Ambulatory Visit: Payer: 59 | Admitting: Family Medicine

## 2020-07-21 ENCOUNTER — Telehealth (HOSPITAL_COMMUNITY): Payer: Self-pay

## 2020-07-21 NOTE — Telephone Encounter (Signed)
Pt called and wanted to see about rescheduling for cardiac rehab, advised pt that her referral is over a yeat old and that she would need to call her cardiologist to get a new referral placed. Pt understood and stated that she will call Dr. Katrinka Romero and get a new referral placed. Advised pt of where we are scheduling .

## 2020-07-31 ENCOUNTER — Ambulatory Visit: Payer: 59 | Admitting: Nurse Practitioner

## 2020-07-31 ENCOUNTER — Telehealth: Payer: Self-pay | Admitting: Nurse Practitioner

## 2020-07-31 ENCOUNTER — Telehealth: Payer: Self-pay

## 2020-07-31 NOTE — Telephone Encounter (Signed)
Called to reschedule no show appt no answer left VM

## 2020-07-31 NOTE — Telephone Encounter (Signed)
I called patient and left her a vm to call the office because she missed her appt today at 10:45am. Samantha Romero

## 2020-08-15 MED ORDER — CARVEDILOL 25 MG PO TABS: 25.0000 mg | ORAL_TABLET | Freq: Two times a day (BID) | ORAL | 1 refills | Status: DC

## 2020-08-29 DIAGNOSIS — I5022 Chronic systolic (congestive) heart failure: Secondary | ICD-10-CM

## 2020-08-29 DIAGNOSIS — I5021 Acute systolic (congestive) heart failure: Secondary | ICD-10-CM

## 2020-08-29 MED ORDER — SPIRONOLACTONE 25 MG PO TABS: 25.0000 mg | ORAL_TABLET | Freq: Every day | ORAL | 0 refills | Status: DC

## 2020-08-29 NOTE — Telephone Encounter (Signed)
Ok to refer to cardiac rehab 

## 2020-09-03 ENCOUNTER — Encounter (HOSPITAL_COMMUNITY): Payer: Self-pay | Admitting: *Deleted

## 2020-09-03 NOTE — Progress Notes (Signed)
Received another referral from Dr. Katrinka Blazing for this pt to participate in Cardiac rehab.  Pt referred and scheduled for Cardiac year last year.  Unfortunately did not respond to calls to reschedule the missed appt. Clinical review of pt follow up appt on 1/10 with Dr. Katrinka Blazing - cardiologist office note. Also reviewed progress notes for PCP appt establishing care with Jackquline Bosch NP.  Pt appropriate for scheduling for on site cardiac rehab and/or enrollment in Virtual Cardiac Rehab.  Pt Covid Risk Score is 4.  Will forward to staff for scheduling when able as there is long wait list and insurance benefits and eligiblity. Alanson Aly, BSN Cardiac and Emergency planning/management officer

## 2020-09-04 ENCOUNTER — Other Ambulatory Visit: Payer: Self-pay | Admitting: Cardiology

## 2020-09-25 ENCOUNTER — Telehealth (HOSPITAL_COMMUNITY): Payer: Self-pay

## 2020-09-25 NOTE — Telephone Encounter (Signed)
Pt insurance is active and benefits verified through [employer] Co-pay 0, DED $1,000/0 met, out of pocket $4,000/0 met, co-insurance 0%. no pre-authorization required. Passport, 09/25/2020@10 :45am, REF# [id number]   Will contact patient to see if she is interested in the Cardiac Rehab Program. If interested, patient will need to complete follow up appt. Once completed, patient will be contacted for scheduling upon review by the RN Navigator.

## 2020-09-25 NOTE — Telephone Encounter (Signed)
Called patient to see if she is interested in the Cardiac Rehab Program. Patient expressed interest. Explained scheduling process and went over insurance, patient verbalized understanding. Will contact patient for scheduling at a later time.

## 2020-09-30 ENCOUNTER — Ambulatory Visit (INDEPENDENT_AMBULATORY_CARE_PROVIDER_SITE_OTHER): Payer: BC Managed Care – PPO | Admitting: Sports Medicine

## 2020-09-30 ENCOUNTER — Other Ambulatory Visit: Payer: Self-pay

## 2020-09-30 VITALS — BP 141/89 | Ht 62.0 in | Wt 315.0 lb

## 2020-09-30 DIAGNOSIS — M25552 Pain in left hip: Secondary | ICD-10-CM

## 2020-09-30 NOTE — Patient Instructions (Signed)
It was a pleasure to see you today!  You have pelvic stabilization weakness causing the pain on the outside of your left hip. To treat this, we recommend doing physical therapy, and we will recommend you be evaluated for aquatherapy as part of that.   Your pain is likely coming from the above weakness, however, we will get an x-ray of your left hip for good measure to look for arthritis. You can go to [facility] (2 locations on [address]) at [employer]. They are open 8 AM to 4:30 PM M-F. You do not need an appointment, they take walk-ins and will send the imaging to [place].   Follow up in 6 weeks.  Be Well,  Dr. Leary Roca

## 2020-09-30 NOTE — Progress Notes (Signed)
    SUBJECTIVE:   CHIEF COMPLAINT / HPI: left hip pain  45 yo woman presents with left hip pain that has been problematic for several weeks. There was no inciting incident that patient can recall, but the pain had an insidious onset that has gotten worse over several weeks. She particularly notices the pain when walking, for example when walking around [employer]. This has limited her exercise due to pain.  Pain is dull and aching on her lateral hip and buttock, it does not radiate. She has occasional very minimal anterior groin pain, but this is not as common as the lateral posterior pain. Patient has CHF and will be starting formal cardiac rehab in August. She lives alone and is concerned about doing exercises in case something were to happen. Patient is actively in process of losing weight with goal to increase exercise and improve diet.  PERTINENT  PMH / PSH: NYHA Class II CHF, HTN, morbid obesity, h/o lap band and revision  OBJECTIVE:   BP (!) 141/89   Ht 5\' 2"  (1.575 m)   Wt (!) 315 lb (142.9 kg)   BMI 57.61 kg/m   Nursing note and vitals reviewed GEN: age-appropriate, AAW, resting comfortably in chair, NAD, morbidly obese, alert and at baseline Left Hip: No obvious rash, erythema, ecchymosis, or edema. ROM full in flex/ext/external rotation, slightly limited in internal rotation; Strength 5/5 in IR/ER/Flex/Ext/Abd/Add. Pelvic alignment unremarkable to inspection and palpation. Standing hip positive for trendelenburg tilt, L>R. Greater trochanter without tenderness to palpation. Tenderness present over piriformis. No SI joint tenderness and normal minimal SI movement. Psych: Pleasant and appropriate   ASSESSMENT/PLAN:   Left hip pain Patient with pelvic girdle weakness, positive trendelenburg, causing pain over lateral left buttock and hip. Recommend formal physical therapy program, evaluate for aquatherapy. Because there is ROM limitation on IR on left side and occasional groin pain, will  obtain plain film radiography to r/o OA, however, OA likely does not explain current symptms as well as pelvic girdle weakness. - PT - L hip x-ray - F/U 6 weeks     , MD [facility]

## 2020-09-30 NOTE — Assessment & Plan Note (Signed)
Patient with pelvic girdle weakness, positive trendelenburg, causing pain over lateral left buttock and hip. Recommend formal physical therapy program, evaluate for aquatherapy. Because there is ROM limitation on IR on left side and occasional groin pain, will obtain plain film radiography to r/o OA, however, OA likely does not explain current symptms as well as pelvic girdle weakness. - PT - L hip x-ray - F/U 6 weeks

## 2020-10-02 ENCOUNTER — Ambulatory Visit (HOSPITAL_BASED_OUTPATIENT_CLINIC_OR_DEPARTMENT_OTHER): Payer: 59 | Attending: Sports Medicine | Admitting: Physical Therapy

## 2020-10-02 ENCOUNTER — Other Ambulatory Visit: Payer: Self-pay

## 2020-10-02 ENCOUNTER — Encounter (HOSPITAL_BASED_OUTPATIENT_CLINIC_OR_DEPARTMENT_OTHER): Payer: Self-pay | Admitting: Physical Therapy

## 2020-10-02 DIAGNOSIS — M25552 Pain in left hip: Secondary | ICD-10-CM | POA: Diagnosis not present

## 2020-10-02 DIAGNOSIS — M6281 Muscle weakness (generalized): Secondary | ICD-10-CM

## 2020-10-03 NOTE — Therapy (Signed)
[facility] [address] [place], [place], [address] Phone: [phone]   Fax:  [phone]  Physical Therapy Evaluation  Patient Details  Name: MELLINA BENISON MRN: [id number] Date of Birth: April 30, 1975 Referring Provider (PT): Dr. Reino Bellis   Encounter Date: 10/02/2020   PT End of Session - 10/02/20 2358     Visit Number 1    Number of Visits 12    Date for PT Re-Evaluation 11/13/20    Authorization Type Blue Cross Blue Shield    PT Start Time 1410    PT Stop Time 1510    PT Time Calculation (min) 60 min    Activity Tolerance Patient tolerated treatment well    Behavior During Therapy [facility] for tasks assessed/performed             Past Medical History:  Diagnosis Date   Anxiety    panic attacks   Asthma    CHF (congestive heart failure), NYHA class II, acute, systolic (HCC) 10/06/2018   Depression    Fibromyalgia    Hypertension    no meds currently   No pertinent past medical history    Pneumonia    hx of    Sinus problem     Past Surgical History:  Procedure Laterality Date   ESOPHAGOGASTRODUODENOSCOPY N/A 11/26/2014   Procedure: ESOPHAGOGASTRODUODENOSCOPY (EGD);  Surgeon: Ovidio Kin, MD;  Location: [facility];  Service: General;  Laterality: N/A;   GASTRIC BANDING PORT REVISION  08/24/11   LAPAROSCOPIC GASTRIC BANDING  12/26/2007   LAPAROSCOPIC LYSIS OF ADHESIONS N/A 01/06/2015   Procedure: LAPAROSCOPIC LYSIS OF ADHESIONS;  Surgeon: Luretha Murphy, MD;  Location: [facility];  Service: General;  Laterality: N/A;   LAPAROSCOPIC REPAIR AND REMOVAL OF GASTRIC BAND N/A 01/06/2015   Procedure: LAPAROSCOPIC RESIGHTING OF GASTRIC Band PORT;  Surgeon: Luretha Murphy, MD;  Location: [facility];  Service: General;  Laterality: N/A;   NO PAST SURGERIES      There were no vitals filed for this visit.    Subjective Assessment - 10/02/20 1414     Subjective Pt reports no specific MOI, but insidious onset.  Pt states  she has been having pain in L hip since 2014.  She received PT and a cortisone shot but both did not improve sx's.  She was also having pain in other areas inclduing back, neck, and shoulders.  Pt saw MDs at [facility] and was dx'd with fibromyalgia.  Pt reports improved pain in other areas as she was being treated by fibromyalgia, but her L hip continued to hurt.  Pt states her pain worsened in November when she changed jobs and became more sedentary.  Pt has tried chiropractic care which didn't provide any relief.  Pt gets massages 2x/month and receives short term relief.  Pt saw MD who ordered an x ray which she is having tomorrow.  MD referall indicated L hip pain/pelvic stabilizer weakness and incorporating Aquatic Therapy.  Pt has pain and is limited with ambulation including shopping at [employer].  She has reduced pain if she is using a cart with shopping.  Pt has pain with  household chores including washing dishes.  pt states she has to stop and rest while performing household chores due to pain.  Pt has pain with ascending stairs.  Pt is limited with playing with her nephews.    Pertinent History CHF(starting cardiac rehab in August), Morbid Obesity, fibromyalgia,  Hx of lap band and revision surgery  Limitations Sitting;Standing;Walking;House hold activities    How long can you sit comfortably? 20 mins    How long can you stand comfortably? 10 mins    How long can you walk comfortably? 10 mins    Diagnostic tests x ray tomorrow    Patient Stated Goals reduce pain, lose weight, increase stamina, to be able to workout, increase walking distance, improve performance of household chores    Currently in Pain? Yes    Pain Score 2    Worst:  8-9/10, Best:  1/10   Pain Location Hip   lateral and posterior   Pain Orientation Left    Pain Descriptors / Indicators Constant;Aching;Nagging    Pain Type Chronic pain    Pain Onset More than a month ago    Aggravating Factors  ambulation,  household chores    Pain Relieving Factors changing positions, using theragun, stretching leg out                [facility] PT Assessment - 10/03/20 0001       Assessment   Medical Diagnosis L hip pain    Referring Provider (PT) Dr. Reino Bellis    Onset Date/Surgical Date --   Exacerbation in November 2021   Hand Dominance Left    Next MD Visit 11/11/20    Prior Therapy Has received prior PT in 2014      Precautions   Precautions None      Restrictions   Weight Bearing Restrictions No      Balance Screen   Has the patient fallen in the past 6 months No      Home Environment   Living Environment Private residence    Additional Comments 3 story home but primarily uses 2 floors      Prior Function   Level of Independence Independent    Vocation Full time employment    Production designer, theatre/television/film.  Primarily sitting    Leisure watching TV      Cognition   Overall Cognitive Status Within Functional Limits for tasks assessed      Observation/Other Assessments   Other Surveys  --   LEFS:  30/80     AROM   AROM Assessment Site Hip    Right/Left Hip Right;Left    Right Hip Extension --   WNL   Right Hip External Rotation  40    Right Hip Internal Rotation  21    Right Hip ABduction --   WNL   Left Hip Extension --   WNL   Left Hip External Rotation  28    Left Hip Internal Rotation  25    Left Hip ABduction --   WNL     Strength   Strength Assessment Site Hip;Knee    Right/Left Hip Right;Left    Right Hip Extension 5/5    Right Hip External Rotation  5/5    Right Hip Internal Rotation 5/5    Right Hip ABduction 5/5    Left Hip Flexion 5/5    Left Hip Extension 4/5    Left Hip External Rotation 5/5    Left Hip Internal Rotation 5/5    Left Hip ABduction 5/5    Right Knee Flexion 5/5    Right Knee Extension 5/5    Left Knee Flexion 5/5    Left Knee Extension 5/5      Ambulation/Gait   Ambulation/Gait Yes    Gait Comments Ambulates  without limping or favoring of  L LE.  Limited reciprocal arm swing.                        Objective measurements completed on examination: See above findings.       [facility] Adult PT Treatment/Exercise - 10/03/20 0001       Exercises   Exercises Knee/Hip      Knee/Hip Exercises: Stretches   Other Knee/Hip Stretches Educated pt concerning POC, dx, and objective findings.  Answered Pt's questions and educated pt concerning the process of aquatic therapy.  Pt received a HEP handout and was educated in correct form and appropriate frequency.  Pt educated she should not have increased pain with HEP.    Other Knee/Hip Stretches Supine glute stretch with strap 2x30 seconds (L)      Knee/Hip Exercises: Supine   Other Supine Knee/Hip Exercises supine PPT x 15 reps, supine marching with PPT x 10 reps, bent knee fall out with PPT x approx 15 reps, and TrA contraction with 5 sec hold.                    PT Education - 10/02/20 1512     Education Details Educated pt concerning POC, dx, and objective findings.  Answered Pt's questions and educated pt concerning the process of aquatic therapy.  Pt received a HEP handout and was educated in correct form and appropriate frequency.  Pt educated she should not have increased pain with HEP.  Pt instructed she should not stretch into a painful range.  Pt was instructed in correct form of TrA contraction.  Access Code: [email or web address]  URL: [email or web address]  Date: 10/02/2020  Prepared by: Aaron Edelman    Exercises  Supine Gluteus Stretch - 2 x daily - 7 x weekly - 1 sets - 2-3 reps - 20-30 seconds hold  Supine Posterior Pelvic Tilt - 2 x daily - 7 x weekly - 2 sets - 10 reps  Bent Knee Fallouts - 1 x daily - 7 x weekly - 3 sets - 10 reps  Supine March - 2 x daily - 7 x weekly - 2 sets - 10 reps    Person(s) Educated Patient    Methods Explanation;Demonstration;Tactile cues;Verbal cues;Handout    Comprehension Verbal cues  required;Returned demonstration;Verbalized understanding              PT Short Term Goals - 10/03/20 0800       PT SHORT TERM GOAL #1   Title Pt will be independent and compliant with HEP for improved pain, strength, ROM, and function.    Time 3    Period Weeks    Status New    [place] Date 10/23/20      PT SHORT TERM GOAL #2   Title Pt will demo improved L hip ER AROM by 10 deg for improved flexibilty and mobility with daily activities.    Baseline L hip ER AROM:  28 deg.    Time 3    Period Weeks    Status New    Target Date 10/23/20      PT SHORT TERM GOAL #3   Title Pt will report at least a 25% improvement in pain with ambulation and daily mobility.    Time 3    Period Weeks    Target Date 10/23/20      PT SHORT TERM GOAL #4   Title Pt will tolerate aquatic exercises well without adverse effects for improved strength, mobility, and  tolerance to activity.    Time 3    Period Weeks    Status New    Target Date 10/23/20               PT Long Term Goals - 10/03/20 0805       PT LONG TERM GOAL #1   Title Pt will be able to perform her normal household chores without significant pain and without having to stop and rest.    Baseline Pt has to stop intermittently while performing household chore due to pain.    Time 6    Period Weeks    Status New    [place] Date 11/13/20      PT LONG TERM GOAL #2   Title Pt will report she is able to ambulate community distance without significant pain or difficulty.    Time 3    Period Weeks    Status New    [employer] Date 11/13/20      PT LONG TERM GOAL #3   Title Pt will be able to shop in [employer] without significant pain and without having to push the cart    Baseline Pt pushes the shopping cart in [employer] while shopping.    Time 3    Period Weeks    Status New    [place] Date 11/13/20      PT LONG TERM GOAL #4   Title Pt will demo improved L hip extension strength to 5/5 MMT for improved strength and tolerance to  daily functional mobility and household chores.    Baseline L hip ext strength:  4/5 MMT    Time 6    Period Weeks    Status New    [place] Date 11/13/20                    Plan - 10/02/20 2334     Clinical Impression Statement Pt is a 45 y/o female with a dx of L hip pain presenting to the clinic with L posterior and lateral chronic hip pain, L hip extensor weakness, and limited ER ROM.  Pt had had chronic L hip pain which exacerbated this past November when changing to a more sedentary job.  MD referall indicated aquatic therapy.  Pt has pain with her normal functional mobility skills including ambulation and stairs.  She is limited with ambulation distance including shopping.  Pt has to change positions with sitting due to pain.  Pt has pain with household chores and has to stop and rest while performing household chores.  She is also limited with playing with her nephews.  Pt should benefit from skilled PT services including aquatic therapy to improve pain, strength, flexibility, mobility, and tolerance to activity.    Personal Factors and Comorbidities Comorbidity 3+;Time since onset of injury/illness/exacerbation    Comorbidities CHF, morbid obesity, and fibromyalgia    Examination-Activity Limitations Stairs;Squat;Locomotion Level;Transfers;Sit    Examination-Participation Restrictions Cleaning;Other   playing with nephews   Stability/Clinical Decision Making Evolving/Moderate complexity    Clinical Decision Making Moderate    Rehab Potential Good    PT Frequency 2x / week    PT Duration 6 weeks    PT Treatment/Interventions ADLs/Self Care Home Management;Aquatic Therapy;Cryotherapy;Electrical Stimulation;Moist Heat;Ultrasound;Neuromuscular re-education;Therapeutic exercise;Therapeutic activities;Functional mobility training;Stair training;Gait training;Patient/family education;Manual techniques;Dry needling;Passive range of motion    PT Next Visit Plan Aquatic therapy next  visit.  Will alternate 1 land visit and and 1 Aquatic visit.  When on land, review HEP and  progress hip and core strength and stability.  Stretching to glute and piriformis.    PT Home Exercise Plan Access Code: [email or web address]  URL: [email or web address]  Date: 10/02/2020  Prepared by: Aaron Edelmanrey Vernadette Stutsman    Exercises  Supine Gluteus Stretch - 2 x daily - 7 x weekly - 1 sets - 2-3 reps - 20-30 seconds hold  Supine Posterior Pelvic Tilt - 2 x daily - 7 x weekly - 2 sets - 10 reps  Bent Knee Fallouts - 1 x daily - 7 x weekly - 3 sets - 10 reps  Supine March - 2 x daily - 7 x weekly - 2 sets - 10 reps.  Pt also received a handout for TrA contraction.    Recommended Other Services --   Pt is planning to start cardiac rehab in August   Consulted and Agree with Plan of Care Patient             Patient will benefit from skilled therapeutic intervention in order to improve the following deficits and impairments:  Decreased activity tolerance, Decreased mobility, Decreased endurance, Decreased range of motion, Decreased strength, Difficulty walking, Impaired flexibility, Pain  Visit Diagnosis: Pain in left hip - Plan: PT plan of care cert/re-cert  Muscle weakness (generalized) - Plan: PT plan of care cert/re-cert     Problem List Patient Active Problem List   Diagnosis Date Noted   Left hip pain 09/30/2020   Environmental and seasonal allergies 07/24/2019   Asthma 10/06/2018   Hypertensive urgency 10/06/2018   GERD (gastroesophageal reflux disease) 10/06/2018   Depression with anxiety 10/06/2018   CHF (congestive heart failure), NYHA class II, acute, systolic (HCC) 10/06/2018   Shortness of breath    Cardiomegaly    Snoring 10/04/2018   Essential hypertension 08/14/2018   Moderate persistent asthma with acute exacerbation 08/14/2018   Fibromyalgia 11/18/2016   Class 3 severe obesity due to excess calories without serious comorbidity with body mass index (BMI) of 45.0 to 49.9 in adult  (HCC) 11/18/2016   Adjustment reaction with anxiety and depression 11/18/2016   Chronic bilateral low back pain without sciatica 09/22/2015   Myofascial pain 09/22/2015   Lapband port leaking-REVISED May 2013 09/10/2011   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

## 2020-10-07 ENCOUNTER — Other Ambulatory Visit: Payer: Self-pay

## 2020-10-07 ENCOUNTER — Ambulatory Visit (HOSPITAL_BASED_OUTPATIENT_CLINIC_OR_DEPARTMENT_OTHER): Payer: 59 | Admitting: Physical Therapy

## 2020-10-07 ENCOUNTER — Encounter (HOSPITAL_BASED_OUTPATIENT_CLINIC_OR_DEPARTMENT_OTHER): Payer: Self-pay | Admitting: Physical Therapy

## 2020-10-07 DIAGNOSIS — M25552 Pain in left hip: Secondary | ICD-10-CM | POA: Diagnosis not present

## 2020-10-07 DIAGNOSIS — M6281 Muscle weakness (generalized): Secondary | ICD-10-CM | POA: Diagnosis not present

## 2020-10-07 NOTE — Therapy (Signed)
[facility] [address] [place], [place], [address] Phone: [phone]   Fax:  [phone]  Physical Therapy Treatment  Patient Details  Name: Samantha Romero MRN: [id number] Date of Birth: Dec 30, 1975 Referring Provider (PT): Dr. Reino Bellis   Encounter Date: 10/07/2020   PT End of Session - 10/07/20 1650     Visit Number 2    Number of Visits 12    Date for PT Re-Evaluation 11/13/20    Authorization Type Blue Cross Blue Shield    PT Start Time 1409    PT Stop Time 1449    PT Time Calculation (min) 40 min    Equipment Utilized During Treatment Other (comment)   Pt used water shoes   Activity Tolerance Patient tolerated treatment well    Behavior During Therapy [facility] for tasks assessed/performed             Past Medical History:  Diagnosis Date   Anxiety    panic attacks   Asthma    CHF (congestive heart failure), NYHA class II, acute, systolic (HCC) 10/06/2018   Depression    Fibromyalgia    Hypertension    no meds currently   No pertinent past medical history    Pneumonia    hx of    Sinus problem     Past Surgical History:  Procedure Laterality Date   ESOPHAGOGASTRODUODENOSCOPY N/A 11/26/2014   Procedure: ESOPHAGOGASTRODUODENOSCOPY (EGD);  Surgeon: Ovidio Kin, MD;  Location: [facility];  Service: General;  Laterality: N/A;   GASTRIC BANDING PORT REVISION  08/24/11   LAPAROSCOPIC GASTRIC BANDING  12/26/2007   LAPAROSCOPIC LYSIS OF ADHESIONS N/A 01/06/2015   Procedure: LAPAROSCOPIC LYSIS OF ADHESIONS;  Surgeon: Luretha Murphy, MD;  Location: [facility];  Service: General;  Laterality: N/A;   LAPAROSCOPIC REPAIR AND REMOVAL OF GASTRIC BAND N/A 01/06/2015   Procedure: LAPAROSCOPIC RESIGHTING OF GASTRIC Band PORT;  Surgeon: Luretha Murphy, MD;  Location: [facility];  Service: General;  Laterality: N/A;   NO PAST SURGERIES      There were no vitals filed for this visit.   Subjective Assessment - 10/07/20 1401      Subjective Pt denies any adverse effects after prior Rx.  She didn't have soreness, but could tell she did something.  Pt reports compliance with HEP and states she really likes the PPT.  Pt hasn't had an x ray yet.  Pt has pain and is limted with ambulation including shopping.  Pt had increased pain with performing household chores over the weekend.  Pt has pain with ascending stairs.  She is limited playing with her nephews.  Pt denies pain currently.    Pertinent History CHF(starting cardiac rehab in August), Morbid Obesity, fibromyalgia,  Hx of lap band and revision surgery    How long can you sit comfortably? 20 mins    How long can you stand comfortably? 10 mins    How long can you walk comfortably? 10 mins    Patient Stated Goals reduce pain, lose weight, increase stamina, to be able to workout, increase walking distance, improve performance of household chores              Pt seen for aquatic therapy today.  Treatment took place in water 3.5-4 ft in depth at the [facility] pool. Temp of water was 92.  Pt entered/exited the pool via stairs with a reciprocal pattern independently with bilat rail.  Pt requires buoyancy for support and to offload joints with  strengthening exercises. Viscosity of the water is needed for resistance of strengthening; water current perturbations provides challenge to standing balance unsupported, requiring increased core activation. Introduction to water. Had patient stand at different levels so she could feel the bouncy   Pt ambulated fwd x 3 reps, sidepstepped x 3 reps, and ambulated bwd x 2 reps. Pt performed: -marching 2x10, squats x 10 reps, hip 3 way (flex, abd, ext) 2x10 reps  -standing HS stretch on step 2x20-30 sec bilat -seated bicycles and flutter kicks 2x10 reps.  Seated piriformis stretch 2x20-30 sec bilat.     PT Education - 10/07/20 1649     Education Details Educated pt concerning POC and answered pt's questions.  Educated  pt on the properties and benefits of aquatic therapy including buoyance and resistance for improved core and LE strength, balance, and reduced stress on joints.  Instruction and cuing for correct form with exercises.    Person(s) Educated Patient    Methods Explanation;Demonstration;Verbal cues    Comprehension Verbalized understanding;Returned demonstration;Verbal cues required              PT Short Term Goals - 10/03/20 0800       PT SHORT TERM GOAL #1   Title Pt will be independent and compliant with HEP for improved pain, strength, ROM, and function.    Time 3    Period Weeks    Status New    [place] Date 10/23/20      PT SHORT TERM GOAL #2   Title Pt will demo improved L hip ER AROM by 10 deg for improved flexibilty and mobility with daily activities.    Baseline L hip ER AROM:  28 deg.    Time 3    Period Weeks    Status New    Target Date 10/23/20      PT SHORT TERM GOAL #3   Title Pt will report at least a 25% improvement in pain with ambulation and daily mobility.    Time 3    Period Weeks    Target Date 10/23/20      PT SHORT TERM GOAL #4   Title Pt will tolerate aquatic exercises well without adverse effects for improved strength, mobility, and tolerance to activity.    Time 3    Period Weeks    Status New    Target Date 10/23/20               PT Long Term Goals - 10/03/20 0805       PT LONG TERM GOAL #1   Title Pt will be able to perform her normal household chores without significant pain and without having to stop and rest.    Baseline Pt has to stop intermittently while performing household chore due to pain.    Time 6    Period Weeks    Status New    [place] Date 11/13/20      PT LONG TERM GOAL #2   Title Pt will report she is able to ambulate community distance without significant pain or difficulty.    Time 3    Period Weeks    Status New    [place] Date 11/13/20      PT LONG TERM GOAL #3   Title Pt will be able to shop in [employer]  without significant pain and without having to push the cart    Baseline Pt pushes the shopping cart in [employer] while shopping.    Time 3  Period Weeks    Status New    [place] Date 11/13/20      PT LONG TERM GOAL #4   Title Pt will demo improved L hip extension strength to 5/5 MMT for improved strength and tolerance to daily functional mobility and household chores.    Baseline L hip ext strength:  4/5 MMT    Time 6    Period Weeks    Status New    [place] Date 11/13/20                   Plan - 10/07/20 1652     Clinical Impression Statement Pt enjoyed aquatic therapy and reports feeling good when immediately getting into the pool.  Pt has been performing her HEP.  Pt had to concentrate to control L LE as the buoyancy of the water was lifting her L LE excessively frequently.  Pt performed exercises well with instruction and cuing for correct form and positioning.  Pt has pain and limitations with ambulation, household chores, and stairs.  Pt responded well to Rx reporting minimally increased pain to 1/10 after Rx after getting out of the pool.  Pt should benefit from skilled PT services including aquatic therapy.    Personal Factors and Comorbidities Comorbidity 3+;Time since onset of injury/illness/exacerbation    Comorbidities CHF, morbid obesity, and fibromyalgia    PT Treatment/Interventions ADLs/Self Care Home Management;Aquatic Therapy;Cryotherapy;Electrical Stimulation;Moist Heat;Ultrasound;Neuromuscular re-education;Therapeutic exercise;Therapeutic activities;Functional mobility training;Stair training;Gait training;Patient/family education;Manual techniques;Dry needling;Passive range of motion    PT Next Visit Plan Land therapy next visit.  Will alternate 1 land and 1 Aquatic visit.  Review HEP and progress hip and core strength and stability.  Stretching to glute and piriformis.    PT Home Exercise Plan Access Code: [email or web address]  URL: [email or web address]  Date:  10/02/2020    Consulted and Agree with Plan of Care Patient             Patient will benefit from skilled therapeutic intervention in order to improve the following deficits and impairments:  Decreased activity tolerance, Decreased mobility, Decreased endurance, Decreased range of motion, Decreased strength, Difficulty walking, Impaired flexibility, Pain  Visit Diagnosis: Pain in left hip  Muscle weakness (generalized)     Problem List Patient Active Problem List   Diagnosis Date Noted   Left hip pain 09/30/2020   Environmental and seasonal allergies 07/24/2019   Asthma 10/06/2018   Hypertensive urgency 10/06/2018   GERD (gastroesophageal reflux disease) 10/06/2018   Depression with anxiety 10/06/2018   CHF (congestive heart failure), NYHA class II, acute, systolic (HCC) 10/06/2018   Shortness of breath    Cardiomegaly    Snoring 10/04/2018   Essential hypertension 08/14/2018   Moderate persistent asthma with acute exacerbation 08/14/2018   Fibromyalgia 11/18/2016   Class 3 severe obesity due to excess calories without serious comorbidity with body mass index (BMI) of 45.0 to 49.9 in adult (HCC) 11/18/2016   Adjustment reaction with anxiety and depression 11/18/2016   Chronic bilateral low back pain without sciatica 09/22/2015   Myofascial pain 09/22/2015   Lapband port leaking-REVISED May 2013 09/10/2011   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 [place], [place], [address] Phone: [phone]   Fax:  [phone]  Name: Samantha Romero MRN: [id number] Date of Birth: 04/08/1975

## 2020-10-08 ENCOUNTER — Emergency Department (HOSPITAL_COMMUNITY): Payer: BC Managed Care – PPO

## 2020-10-08 ENCOUNTER — Other Ambulatory Visit: Payer: Self-pay

## 2020-10-08 ENCOUNTER — Encounter (HOSPITAL_COMMUNITY): Payer: Self-pay | Admitting: Emergency Medicine

## 2020-10-08 ENCOUNTER — Emergency Department (HOSPITAL_COMMUNITY)
Admission: EM | Admit: 2020-10-08 | Discharge: 2020-10-09 | Disposition: A | Discharge status: Home / Self Care | Payer: BC Managed Care – PPO | Attending: Emergency Medicine | Admitting: Emergency Medicine

## 2020-10-08 DIAGNOSIS — I110 Hypertensive heart disease with heart failure: Secondary | ICD-10-CM | POA: Diagnosis not present

## 2020-10-08 DIAGNOSIS — R079 Chest pain, unspecified: Secondary | ICD-10-CM | POA: Diagnosis not present

## 2020-10-08 DIAGNOSIS — Z79899 Other long term (current) drug therapy: Secondary | ICD-10-CM | POA: Insufficient documentation

## 2020-10-08 DIAGNOSIS — R0789 Other chest pain: Secondary | ICD-10-CM | POA: Diagnosis not present

## 2020-10-08 DIAGNOSIS — Z7952 Long term (current) use of systemic steroids: Secondary | ICD-10-CM | POA: Diagnosis not present

## 2020-10-08 DIAGNOSIS — R0602 Shortness of breath: Secondary | ICD-10-CM | POA: Diagnosis not present

## 2020-10-08 DIAGNOSIS — Z20822 Contact with and (suspected) exposure to covid-19: Secondary | ICD-10-CM | POA: Insufficient documentation

## 2020-10-08 DIAGNOSIS — J45909 Unspecified asthma, uncomplicated: Secondary | ICD-10-CM | POA: Diagnosis not present

## 2020-10-08 DIAGNOSIS — R531 Weakness: Secondary | ICD-10-CM | POA: Diagnosis not present

## 2020-10-08 DIAGNOSIS — I5021 Acute systolic (congestive) heart failure: Secondary | ICD-10-CM | POA: Diagnosis not present

## 2020-10-08 LAB — TROPONIN I (HIGH SENSITIVITY)
Troponin I (High Sensitivity): 5 ng/L (ref <18)
Troponin I (High Sensitivity): 4 ng/L (ref <18)

## 2020-10-08 LAB — CBC WITH DIFFERENTIAL/PLATELET
Abs Immature Granulocytes: 0.04 10*3/uL (ref 0.00–0.07)
Basophils Absolute: 0 10*3/uL (ref 0.0–0.1)
Basophils Relative: 0 %
Eosinophils Absolute: 0.2 10*3/uL (ref 0.0–0.5)
Eosinophils Relative: 2 %
HCT: 43.8 % (ref 36.0–46.0)
Hemoglobin: 13.5 g/dL (ref 12.0–15.0)
Immature Granulocytes: 1 %
Lymphocytes Relative: 38 %
Lymphs Abs: 3.1 10*3/uL (ref 0.7–4.0)
MCH: 27.6 pg (ref 26.0–34.0)
MCHC: 30.8 g/dL (ref 30.0–36.0)
MCV: 89.6 fL (ref 80.0–100.0)
Monocytes Absolute: 0.6 10*3/uL (ref 0.1–1.0)
Monocytes Relative: 7 %
Neutro Abs: 4.3 10*3/uL (ref 1.7–7.7)
Neutrophils Relative %: 52 %
Platelets: 227 10*3/uL (ref 150–400)
RBC: 4.89 MIL/uL (ref 3.87–5.11)
RDW: 13.3 % (ref 11.5–15.5)
WBC: 8.2 10*3/uL (ref 4.0–10.5)
nRBC: 0 % (ref 0.0–0.2)

## 2020-10-08 LAB — BASIC METABOLIC PANEL
Anion gap: 9 (ref 5–15)
BUN: 11 mg/dL (ref 6–20)
CO2: 27 mmol/L (ref 22–32)
Calcium: 9.2 mg/dL (ref 8.9–10.3)
Chloride: 102 mmol/L (ref 98–111)
Creatinine, Ser: 1.02 mg/dL — ABNORMAL HIGH (ref 0.44–1.00)
GFR, Estimated: >60 mL/min (ref >60)
Glucose, Bld: 93 mg/dL (ref 70–99)
Potassium: 3.7 mmol/L (ref 3.5–5.1)
Sodium: 138 mmol/L (ref 135–145)

## 2020-10-08 NOTE — ED Provider Notes (Signed)
Emergency Medicine Provider Triage Evaluation Note  [name] , a 45 y.o. female  was evaluated in triage.  Pt complains of presents with shortness of breath, intermittent chest pain, states this has been going on for last couple of days, generally she will take her anxiety medication and/or a Lasix and this will review the system, unfortunately she cannot do that today.  She states this feels like similar episodes of her chest pain.  She currently has no pain at this time.  She just wants to be checked out..  Review of Systems  Positive: Chest pain, shortness of breath Negative: Nausea, vomiting  Physical Exam  BP 130/90 (BP Location: Left Arm)   Pulse 80   Temp 98.7 F (37.1 C) (Oral)   Resp 19   Ht 5\' 2"  (1.575 m)   Wt (!) 142.9 kg   SpO2 99%   BMI 57.61 kg/m  Gen:   Awake, no distress   Resp:  Normal effort  MSK:   Moves extremities without difficulty  Other:    Medical Decision Making  Medically screening exam initiated at 8:03 PM.  Appropriate orders placed.  [name] was informed that the remainder of the evaluation will be completed by another provider, this initial triage assessment does not replace that evaluation, and the importance of remaining in the ED until their evaluation is complete.  Patient presents with shortness of breath, chest pain, lab work and imaging have been ordered, patient will need further work-up here.   Winn Jock, PA-C 10/08/20 2004    10/10/20, MD 10/08/20 [phone]

## 2020-10-08 NOTE — ED Triage Notes (Signed)
Per ems, pt from home reporting intermittent cp with sob for past two days. Pt reports 1/10 pain at this time. Hx of chf and asthma. 12 lead unremarkable. EMS VSS. 324 ASA given by ems. 20g R hand

## 2020-10-09 ENCOUNTER — Ambulatory Visit (HOSPITAL_BASED_OUTPATIENT_CLINIC_OR_DEPARTMENT_OTHER): Payer: 59 | Admitting: Physical Therapy

## 2020-10-09 LAB — RESP PANEL BY RT-PCR (FLU A&B, COVID) ARPGX2
Influenza A by PCR: NEGATIVE
Influenza B by PCR: NEGATIVE
SARS Coronavirus 2 by RT PCR: NEGATIVE

## 2020-10-09 NOTE — Discharge Instructions (Addendum)
A COVID test has been done.  You will be notified if it comes back positive.  He can follow it on MyChart

## 2020-10-09 NOTE — ED Provider Notes (Signed)
[facility] EMERGENCY DEPARTMENT Provider Note   CSN: [id number] Arrival date & time: 10/08/20  1938     History Chief Complaint  Patient presents with   Chest Pain   Shortness of Breath    Samantha Romero is a 45 y.o. female.   Chest Pain Associated symptoms: shortness of breath   Associated symptoms: no abdominal pain, no back pain, no fever and no weakness   Shortness of Breath Associated symptoms: chest pain   Associated symptoms: no abdominal pain, no fever and no rash   Patient presents with chest pain and shortness of breath.  Has been going for last couple days.  States she has had some congestion.  States there is pain on the upper part of her chest.  Some nasal get other areas of her chest too.  Not exertional in fact states that she has been doing more exertion and doing well with it.  States this weekend she was at a party and was playing with Nerf guns and thinks that could have caused some of her problems.  No swelling her legs.  History of congestive heart failure.  States that her weight is doing well and feels that she is managing the fluid well.  No sore throat but has had some nasal congestion.  Has been compliant with her medications.  Patient also states that she gets nervous and her anxiety will flareup when these episodes happen.    Past Medical History:  Diagnosis Date   Anxiety    panic attacks   Asthma    CHF (congestive heart failure), NYHA class II, acute, systolic (HCC) 10/06/2018   Depression    Fibromyalgia    Hypertension    no meds currently   No pertinent past medical history    Pneumonia    hx of    Sinus problem     Patient Active Problem List   Diagnosis Date Noted   Left hip pain 09/30/2020   Environmental and seasonal allergies 07/24/2019   Asthma 10/06/2018   Hypertensive urgency 10/06/2018   GERD (gastroesophageal reflux disease) 10/06/2018   Depression with anxiety 10/06/2018   CHF (congestive heart failure),  NYHA class II, acute, systolic (HCC) 10/06/2018   Shortness of breath    Cardiomegaly    Snoring 10/04/2018   Essential hypertension 08/14/2018   Moderate persistent asthma with acute exacerbation 08/14/2018   Fibromyalgia 11/18/2016   Class 3 severe obesity due to excess calories without serious comorbidity with body mass index (BMI) of 45.0 to 49.9 in adult (HCC) 11/18/2016   Adjustment reaction with anxiety and depression 11/18/2016   Chronic bilateral low back pain without sciatica 09/22/2015   Myofascial pain 09/22/2015   Lapband port leaking-REVISED May 2013 09/10/2011   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 07/21/2011    Past Surgical History:  Procedure Laterality Date   ESOPHAGOGASTRODUODENOSCOPY N/A 11/26/2014   Procedure: ESOPHAGOGASTRODUODENOSCOPY (EGD);  Surgeon: Ovidio Kin, MD;  Location: [facility];  Service: General;  Laterality: N/A;   GASTRIC BANDING PORT REVISION  08/24/11   LAPAROSCOPIC GASTRIC BANDING  12/26/2007   LAPAROSCOPIC LYSIS OF ADHESIONS N/A 01/06/2015   Procedure: LAPAROSCOPIC LYSIS OF ADHESIONS;  Surgeon: Luretha Murphy, MD;  Location: [facility];  Service: General;  Laterality: N/A;   LAPAROSCOPIC REPAIR AND REMOVAL OF GASTRIC BAND N/A 01/06/2015   Procedure: LAPAROSCOPIC RESIGHTING OF GASTRIC Band PORT;  Surgeon: Luretha Murphy, MD;  Location: [facility];  Service: General;  Laterality: N/A;   NO PAST SURGERIES  OB History   No obstetric history on file.     Family History  Problem Relation Age of Onset   Ulcers Mother    Hypertension Mother    Cancer Maternal Uncle        lung   Cancer Maternal Grandfather        lung and back    Social History   Tobacco Use   Smoking status: Never   Smokeless tobacco: Never  Vaping Use   Vaping Use: Never used  Substance Use Topics   Alcohol use: Never   Drug use: Never    Home Medications Prior to Admission medications   Medication Sig Start Date End Date Taking? Authorizing Provider  albuterol (PROVENTIL)  (2.5 MG/3ML) 0.083% nebulizer solution Take 3 mLs (2.5 mg total) by nebulization every 6 (six) hours as needed for wheezing or shortness of breath. 08/23/18   Lezlie Lye, Meda Coffee, MD  ALPRAZolam Prudy Feeler) 1 MG tablet Take 1 tablet (1 mg total) by mouth 2 (two) times daily as needed for anxiety. 06/19/20   [name], FNP  azelastine (ASTELIN) 0.1 % nasal spray Place 2 sprays into both nostrils 2 (two) times daily. 10/19/18   Marcelyn Bruins, MD  buPROPion (WELLBUTRIN XL) 150 MG 24 hr tablet Take 3 tablets (450 mg total) by mouth daily. 06/19/20 10/17/20  [name], FNP  carvedilol (COREG) 25 MG tablet Take 1 tablet (25 mg total) by mouth 2 (two) times daily with a meal. 08/15/20   Lyn Records, MD  DULoxetine (CYMBALTA) 60 MG capsule Take 1 capsule (60 mg total) by mouth daily. 04/08/20   Just, Azalee Course, FNP  empagliflozin (JARDIANCE) 10 MG TABS tablet Take 1 tablet (10 mg total) by mouth daily before breakfast. 02/19/20   Lyn Records, MD  fluticasone [facility])) 50 MCG/ACT nasal spray Place 1 spray into both nostrils every morning.    [provider]  Fluticasone-Salmeterol 232-14 MCG/ACT AEPB Inhale 1 puff into the lungs in the morning and at bedtime. 11/19/19   Noni Saupe, MD  furosemide (LASIX) 40 MG tablet TAKE 1 TABLET BY MOUTH TWICE A DAY 09/04/20   Georgie Chard D, NP  levocetirizine (XYZAL) 5 MG tablet Take 5 mg by mouth every evening.    [provider]  montelukast (SINGULAIR) 10 MG tablet TAKE 1 TABLET BY MOUTH EVERYDAY AT BEDTIME 04/08/20   Just, Azalee Course, FNP  omeprazole (PRILOSEC) 40 MG capsule TAKE 1 CAPSULE BY MOUTH EVERY DAY 04/08/20   Just, Azalee Course, FNP  sacubitril-valsartan (ENTRESTO) 97-103 MG Take 1 tablet by mouth 2 (two) times daily. 04/16/20   Lyn Records, MD  Semaglutide,0.25 or 0.5MG /DOS, (OZEMPIC, 0.25 OR 0.5 MG/DOSE,) 2 MG/1.5ML SOPN Inject 0.5 mg into the skin once a week. 06/19/20   [name], FNP  spironolactone (ALDACTONE) 25  MG tablet Take 1 tablet (25 mg total) by mouth daily. 08/29/20 11/27/20  Lyn Records, MD  mometasone-formoterol (DULERA) 200-5 MCG/ACT AERO Inhale 2 puffs into the lungs 2 (two) times a day. 10/19/18 12/17/19  Marcelyn Bruins, MD    Allergies    Grass extracts [gramineae pollens] and Shellfish allergy  Review of Systems   Review of Systems  Constitutional:  Negative for appetite change and fever.  HENT:  Positive for congestion.   Respiratory:  Positive for shortness of breath.   Cardiovascular:  Positive for chest pain and leg swelling.  Gastrointestinal:  Negative for abdominal pain.  Genitourinary:  Negative  for dysuria.  Musculoskeletal:  Negative for back pain.  Skin:  Negative for rash.  Neurological:  Negative for weakness.  Psychiatric/Behavioral:  The patient is nervous/anxious.    Physical Exam Updated Vital Signs BP 137/77 (BP Location: Right Arm)   Pulse 93   Temp 97.7 F (36.5 C) (Oral)   Resp (!) 24   Ht 5\' 2"  (1.575 m)   Wt (!) 142.9 kg   LMP 09/24/2020 (Approximate)   SpO2 99%   BMI 57.61 kg/m   Physical Exam Vitals and nursing note reviewed.  HENT:     Head: Atraumatic.  Eyes:     Pupils: Pupils are equal, round, and reactive to light.  Cardiovascular:     Rate and Rhythm: Normal rate and regular rhythm.  Pulmonary:     Breath sounds: No wheezing, rhonchi or rales.  Chest:     Chest wall: Tenderness present.     Comments: Mild right-sided anterior chest tenderness. Musculoskeletal:     Comments: Trace edema bilateral lower extremities.  Skin:    General: Skin is warm.     Capillary Refill: Capillary refill takes less than 2 seconds.  Neurological:     General: No focal deficit present.     Mental Status: She is alert.     Motor: Weakness present.    ED Results / Procedures / Treatments   Labs (all labs ordered are listed, but only abnormal results are displayed) Labs Reviewed  BASIC METABOLIC PANEL - Abnormal; Notable for the  following components:      Result Value   Creatinine, Ser 1.02 (*)    All other components within normal limits  RESP PANEL BY RT-PCR (FLU A&B, COVID) ARPGX2  CBC WITH DIFFERENTIAL/PLATELET  TROPONIN I (HIGH SENSITIVITY)  TROPONIN I (HIGH SENSITIVITY)    EKG EKG Interpretation  Date/Time:  Wednesday October 08 2020 19:52:29 EDT Ventricular Rate:  84 PR Interval:  166 QRS Duration: 90 QT Interval:  408 QTC Calculation: 482 R Axis:   -10 Text Interpretation: Normal sinus rhythm Nonspecific T wave abnormality Prolonged QT Abnormal ECG Confirmed by 06-23[birth year]1989 [phone]) on 10/09/2020 1:43:15 AM  Radiology DG Chest 2 View  Result Date: 10/08/2020 CLINICAL DATA:  Shortness of breath and chest pain EXAM: CHEST - 2 VIEW COMPARISON:  10/05/2018 FINDINGS: Elevation of the right hemidiaphragm is noted. Cardiac shadow is within normal limits. The lungs are clear. No acute bony abnormality is noted. IMPRESSION: No active cardiopulmonary disease. Electronically Signed   By: 12/06/2018 M.D.   On: 10/08/2020 20:30    Procedures Procedures   Medications Ordered in ED Medications - No data to display  ED Course  I have reviewed the triage vital signs and the nursing notes.  Pertinent labs & imaging results that were available during my care of the patient were reviewed by me and considered in my medical decision making (see chart for details).    MDM Rules/Calculators/A&P                          Patient with chest pain.  Slight shortness of breath.  Slight congestion.  Will get COVID test but states she has been diligent with masking and is vaccinated.  Chest x-ray reassuring.  EKG reassuring.  Troponin negative x2.  Doubt cardiac cause of the chest pain.  Has a nonischemic cardiomyopathy but weight is doing well.  Also doing well with her exercise and physical therapy.  Appears stable for  discharge.  If COVID test came back positive need to follow-up with PCP about further  treatment. Final Clinical Impression(s) / ED Diagnoses Final diagnoses:  None    Rx / DC Orders ED Discharge Orders     None        Benjiman Core, MD 10/09/20 [phone]

## 2020-10-13 ENCOUNTER — Encounter (HOSPITAL_BASED_OUTPATIENT_CLINIC_OR_DEPARTMENT_OTHER): Payer: Self-pay | Admitting: Physical Therapy

## 2020-10-14 ENCOUNTER — Telehealth: Payer: Self-pay

## 2020-10-14 ENCOUNTER — Encounter (HOSPITAL_BASED_OUTPATIENT_CLINIC_OR_DEPARTMENT_OTHER): Payer: Self-pay

## 2020-10-14 ENCOUNTER — Encounter: Payer: Self-pay | Admitting: Nurse Practitioner

## 2020-10-14 ENCOUNTER — Telehealth: Payer: 59 | Admitting: Physician Assistant

## 2020-10-14 ENCOUNTER — Ambulatory Visit (HOSPITAL_BASED_OUTPATIENT_CLINIC_OR_DEPARTMENT_OTHER): Payer: 59 | Admitting: Physical Therapy

## 2020-10-14 NOTE — Telephone Encounter (Signed)
Transition Care Management Follow-up Telephone Call Date of discharge and from where: [facility] 10/09/2020 How have you been since you were released from the hospital? Pt states she cannot currently talk, I at least tried to make pt appointment gave her a date & time, pt said she couldn't at the time and will give [place] a call back when more convenient for her.   Any questions or concerns? No  Items Reviewed: Did the pt receive and understand the discharge instructions provided? Yes  Medications obtained and verified? N/a  Other? N/a Any new allergies since your discharge? N/a Dietary orders reviewed? N/a Do you have support at home? N/a  Home Care and Equipment/Supplies: Were home health services ordered? N/a If so, what is the name of the agency? N/a  Has the agency set up a time to come to the patient's home? N/a Were any new equipment or medical supplies ordered?  N/a What is the name of the medical supply agency? N/a  Were you able to get the supplies/equipment? N/a Do you have any questions related to the use of the equipment or supplies? N/a  Functional Questionnaire: (I = Independent and D = Dependent) ADLs: n/a  Bathing/Dressing- n/a  Meal Prep- n/a  Eating- n/a  Maintaining continence- n/a   Transferring/Ambulation- n/a   Managing Meds- n/a   Follow up appointments reviewed:  PCP Hospital f/u appt confirmed?  N/a  Scheduled to see n/a  on n/a @ n/a. Specialist Hospital f/u appt confirmed? N/a Scheduled to see n/a on n/a @ n/a. Are transportation arrangements needed? N/a If their condition worsens, is the pt aware to call PCP or go to the Emergency Dept.? yes Was the patient provided with contact information for the PCP's office or ED? Yes Was to pt encouraged to call back with questions or concerns? Yes

## 2020-10-16 ENCOUNTER — Ambulatory Visit (HOSPITAL_BASED_OUTPATIENT_CLINIC_OR_DEPARTMENT_OTHER): Payer: 59 | Admitting: Physical Therapy

## 2020-10-17 ENCOUNTER — Other Ambulatory Visit: Payer: Self-pay | Admitting: Nurse Practitioner

## 2020-10-17 ENCOUNTER — Encounter (HOSPITAL_COMMUNITY): Payer: Self-pay

## 2020-10-17 ENCOUNTER — Other Ambulatory Visit: Payer: Self-pay | Admitting: Interventional Cardiology

## 2020-10-17 ENCOUNTER — Other Ambulatory Visit: Payer: Self-pay | Admitting: Cardiology

## 2020-10-17 DIAGNOSIS — F418 Other specified anxiety disorders: Secondary | ICD-10-CM

## 2020-10-17 NOTE — Telephone Encounter (Signed)
Attempted to call patient in regards to Cardiac Rehab - LM on VM Mailed letter 

## 2020-10-21 ENCOUNTER — Ambulatory Visit (HOSPITAL_BASED_OUTPATIENT_CLINIC_OR_DEPARTMENT_OTHER): Payer: 59 | Admitting: Physical Therapy

## 2020-10-21 ENCOUNTER — Telehealth: Payer: Self-pay

## 2020-10-21 ENCOUNTER — Other Ambulatory Visit: Payer: Self-pay | Admitting: Nurse Practitioner

## 2020-10-21 DIAGNOSIS — F418 Other specified anxiety disorders: Secondary | ICD-10-CM

## 2020-10-21 NOTE — Telephone Encounter (Signed)
Please call to schedule an appt before this can be filled

## 2020-10-21 NOTE — Telephone Encounter (Signed)
We received a refill request for xanax but pt needs an appt. I left pt vm to call the office to schedule an appt  YL,RMA

## 2020-10-23 ENCOUNTER — Ambulatory Visit (HOSPITAL_BASED_OUTPATIENT_CLINIC_OR_DEPARTMENT_OTHER): Payer: 59 | Admitting: Physical Therapy

## 2020-10-27 ENCOUNTER — Other Ambulatory Visit: Payer: Self-pay

## 2020-10-27 ENCOUNTER — Ambulatory Visit (INDEPENDENT_AMBULATORY_CARE_PROVIDER_SITE_OTHER): Payer: 59 | Admitting: Nurse Practitioner

## 2020-10-27 ENCOUNTER — Encounter: Payer: Self-pay | Admitting: Nurse Practitioner

## 2020-10-27 ENCOUNTER — Other Ambulatory Visit: Payer: Self-pay | Admitting: Nurse Practitioner

## 2020-10-27 VITALS — BP 124/80 | HR 62 | Temp 98.0°F | Ht 62.0 in | Wt 309.4 lb

## 2020-10-27 DIAGNOSIS — Z6841 Body Mass Index (BMI) 40.0 and over, adult: Secondary | ICD-10-CM

## 2020-10-27 DIAGNOSIS — I10 Essential (primary) hypertension: Secondary | ICD-10-CM

## 2020-10-27 DIAGNOSIS — R7309 Other abnormal glucose: Secondary | ICD-10-CM

## 2020-10-27 DIAGNOSIS — Z1231 Encounter for screening mammogram for malignant neoplasm of breast: Secondary | ICD-10-CM

## 2020-10-27 DIAGNOSIS — F418 Other specified anxiety disorders: Secondary | ICD-10-CM | POA: Diagnosis not present

## 2020-10-27 DIAGNOSIS — J4520 Mild intermittent asthma, uncomplicated: Secondary | ICD-10-CM

## 2020-10-27 DIAGNOSIS — J4540 Moderate persistent asthma, uncomplicated: Secondary | ICD-10-CM

## 2020-10-27 DIAGNOSIS — N644 Mastodynia: Secondary | ICD-10-CM

## 2020-10-27 DIAGNOSIS — K219 Gastro-esophageal reflux disease without esophagitis: Secondary | ICD-10-CM

## 2020-10-27 MED ORDER — DULOXETINE HCL 60 MG PO CPEP: 60.0000 mg | ORAL_CAPSULE | Freq: Every day | ORAL | 1 refills | Status: DC

## 2020-10-27 MED ORDER — FLUTICASONE-SALMETEROL 232-14 MCG/ACT IN AEPB: 1.0000 | INHALATION_SPRAY | Freq: Two times a day (BID) | RESPIRATORY_TRACT | 5 refills | Status: DC

## 2020-10-27 MED ORDER — FLUTICASONE PROPIONATE 50 MCG/ACT NA SUSP: 1.0000 | Freq: Every morning | NASAL | 1 refills | Status: DC

## 2020-10-27 MED ORDER — BUPROPION HCL ER (XL) 150 MG PO TB24: 450.0000 mg | ORAL_TABLET | Freq: Every day | ORAL | 1 refills | Status: DC

## 2020-10-27 MED ORDER — LEVOCETIRIZINE DIHYDROCHLORIDE 5 MG PO TABS: 5.0000 mg | ORAL_TABLET | Freq: Every evening | ORAL | 0 refills | Status: DC

## 2020-10-27 MED ORDER — AZELASTINE HCL 0.1 % NA SOLN: 2.0000 | Freq: Two times a day (BID) | NASAL | 5 refills | Status: DC

## 2020-10-27 MED ORDER — EMPAGLIFLOZIN 10 MG PO TABS: ORAL_TABLET | ORAL | 1 refills | Status: DC

## 2020-10-27 MED ORDER — OMEPRAZOLE 40 MG PO CPDR: DELAYED_RELEASE_CAPSULE | ORAL | 1 refills | Status: DC

## 2020-10-27 MED ORDER — OZEMPIC (0.25 OR 0.5 MG/DOSE) 2 MG/1.5ML ~~LOC~~ SOPN: 0.5000 mg | PEN_INJECTOR | SUBCUTANEOUS | 1 refills | Status: DC

## 2020-10-27 MED ORDER — ALBUTEROL SULFATE HFA 108 (90 BASE) MCG/ACT IN AERS: 2.0000 | INHALATION_SPRAY | Freq: Four times a day (QID) | RESPIRATORY_TRACT | 2 refills | Status: DC | PRN

## 2020-10-27 MED ORDER — SPIRONOLACTONE 25 MG PO TABS: 25.0000 mg | ORAL_TABLET | Freq: Every day | ORAL | 1 refills | Status: DC

## 2020-10-27 MED ORDER — ALBUTEROL SULFATE (2.5 MG/3ML) 0.083% IN NEBU
2.5000 mg | INHALATION_SOLUTION | Freq: Four times a day (QID) | RESPIRATORY_TRACT | 1 refills | Status: DC | PRN
Start: 2020-10-27 — End: 2022-03-24

## 2020-10-27 MED ORDER — MONTELUKAST SODIUM 10 MG PO TABS: ORAL_TABLET | ORAL | 2 refills | Status: DC

## 2020-10-27 MED ORDER — CARVEDILOL 25 MG PO TABS: 25.0000 mg | ORAL_TABLET | Freq: Two times a day (BID) | ORAL | 1 refills | Status: DC

## 2020-10-27 MED ORDER — ENTRESTO 97-103 MG PO TABS: 1.0000 | ORAL_TABLET | Freq: Two times a day (BID) | ORAL | 6 refills | Status: DC

## 2020-10-27 MED ORDER — FUROSEMIDE 40 MG PO TABS: 40.0000 mg | ORAL_TABLET | Freq: Two times a day (BID) | ORAL | 1 refills | Status: DC

## 2020-10-27 NOTE — Patient Instructions (Signed)

## 2020-10-27 NOTE — Progress Notes (Signed)
I,Yamilka Roman [employer] as a Education administrator for [employer], FNP.,have documented all relevant documentation on the behalf of [name], FNP,as directed by  [name], FNP while in the presence of [name], [place].  This visit occurred during the SARS-CoV-2 public health emergency.  Safety protocols were in place, including screening questions prior to the visit, additional usage of staff PPE, and extensive cleaning of exam room while observing appropriate contact time as indicated for disinfecting solutions.  Subjective:     Patient ID: Samantha Romero , female    DOB: [birth year] , 45 y.o.   MRN: [id number]   Chief Complaint  Patient presents with   Asthma   Hypertension   Diabetes    HPI  Patient presents today for a blood pressure f/u.  She had been having problems with breathing and called EMS who transferred to [facility]. She had just started PT for her hip pain and had been doing chest exercises so she was unsure of what was going on.   She felt she was having an asthma cycles, will have a little episode then will have postnasal drainage which will cause her to have increased in congestion (generally happens two times a year), she would take prednisone before it gets too bad. She was having problems with speaking. She was able to find her prednisone from a friend for a few days. She was too weak to come to be seen. She reports this happens 1-2 times a year. She has just realized her proventil is expired and she had mucinex from the pharmacy that was expired.   She has fallen off from her Ozempic for about one month and restarted one week ago.   Wt Readings from Last 3 Encounters: 10/27/20 : (!) 309 lb 6.4 oz (140.3 kg) 10/08/20 : (!) 315 lb (142.9 kg) 09/30/20 : (!) 315 lb (142.9 kg)    Asthma There is no cough or shortness of breath. This is a recurrent problem. The current episode started in the past 7 days. The problem occurs intermittently. The problem has been gradually  improving. Pertinent negatives include no appetite change, ear pain, sneezing or sore throat. Her past medical history is significant for asthma.  Hypertension This is a chronic problem. The current episode started more than 1 year ago. The problem is unchanged. The problem is controlled. Pertinent negatives include no anxiety, blurred vision or shortness of breath. There are no associated agents to hypertension. Risk factors for coronary artery disease include obesity and sedentary lifestyle. Past treatments include angiotensin blockers. There are no compliance problems.  There is no history of angina. There is no history of chronic renal disease.  Diabetes She presents for her follow-up diabetic visit. Diabetes type: abnormal glucose. There are no hypoglycemic associated symptoms. Pertinent negatives for diabetes include no blurred vision. There are no hypoglycemic complications. There are no diabetic complications. Risk factors for coronary artery disease include obesity and sedentary lifestyle. When asked about current treatments, none were reported.    Past Medical History:  Diagnosis Date   Anxiety    panic attacks   Asthma    CHF (congestive heart failure), NYHA class II, acute, systolic ([place]) 1/44/3154   Depression    Fibromyalgia    Hypertension    no meds currently   No pertinent past medical history    Pneumonia    hx of    Sinus problem      Family History  Problem Relation Age of Onset  Ulcers Mother    Hypertension Mother    Cancer Maternal Uncle        lung   Cancer Maternal Grandfather        lung and back     Current Outpatient Medications:    albuterol (VENTOLIN HFA) 108 (90 Base) MCG/ACT inhaler, Inhale 2 puffs into the lungs every 6 (six) hours as needed for wheezing or shortness of breath., Disp: 8 g, Rfl: 2   ALPRAZolam (XANAX) 1 MG tablet, Take 1 tablet (1 mg total) by mouth 2 (two) times daily as needed for anxiety., Disp: 40 tablet, Rfl: 2   albuterol  (PROVENTIL) (2.5 MG/3ML) 0.083% nebulizer solution, Take 3 mLs (2.5 mg total) by nebulization every 6 (six) hours as needed for wheezing or shortness of breath., Disp: 150 mL, Rfl: 1   azelastine (ASTELIN) 0.1 % nasal spray, Place 2 sprays into both nostrils 2 (two) times daily., Disp: 30 mL, Rfl: 5   buPROPion (WELLBUTRIN XL) 150 MG 24 hr tablet, Take 3 tablets (450 mg total) by mouth daily., Disp: 180 tablet, Rfl: 1   carvedilol (COREG) 25 MG tablet, Take 1 tablet (25 mg total) by mouth 2 (two) times daily with a meal., Disp: 180 tablet, Rfl: 1   DULoxetine (CYMBALTA) 60 MG capsule, Take 1 capsule (60 mg total) by mouth daily., Disp: 90 capsule, Rfl: 1   empagliflozin (JARDIANCE) 10 MG TABS tablet, TAKE ONE TABLET BY MOUTH DAILY BEFORE BREAKFAST, Disp: 90 tablet, Rfl: 1   fluticasone (FLONASE) 50 MCG/ACT nasal spray, Place 1 spray into both nostrils every morning., Disp: 16 g, Rfl: 1   Fluticasone-Salmeterol 232-14 MCG/ACT AEPB, Inhale 1 puff into the lungs in the morning and at bedtime., Disp: 1 each, Rfl: 5   furosemide (LASIX) 40 MG tablet, Take 1 tablet (40 mg total) by mouth 2 (two) times daily., Disp: 180 tablet, Rfl: 1   levocetirizine (XYZAL) 5 MG tablet, Take 1 tablet (5 mg total) by mouth every evening., Disp: 90 tablet, Rfl: 0   montelukast (SINGULAIR) 10 MG tablet, TAKE 1 TABLET BY MOUTH EVERYDAY AT BEDTIME, Disp: 90 tablet, Rfl: 2   omeprazole (PRILOSEC) 40 MG capsule, TAKE 1 CAPSULE BY MOUTH EVERY DAY, Disp: 90 capsule, Rfl: 1   sacubitril-valsartan (ENTRESTO) 97-103 MG, Take 1 tablet by mouth 2 (two) times daily., Disp: 60 tablet, Rfl: 6   Semaglutide,0.25 or 0.5MG/DOS, (OZEMPIC, 0.25 OR 0.5 MG/DOSE,) 2 MG/1.5ML SOPN, Inject 0.5 mg into the skin once a week., Disp: 4.5 mL, Rfl: 1   spironolactone (ALDACTONE) 25 MG tablet, Take 1 tablet (25 mg total) by mouth daily., Disp: 90 tablet, Rfl: 1   Allergies  Allergen Reactions   Grass Extracts [Gramineae Pollens]     Nasal congestion,  post nasal drip    Shellfish Allergy Itching    In throat only.     Review of Systems  Constitutional: Negative.  Negative for appetite change.  HENT:  Negative for ear pain, sneezing and sore throat.   Eyes:  Negative for blurred vision.  Respiratory: Negative.  Negative for cough and shortness of breath.   Cardiovascular: Negative.   Genitourinary:        Left breast pain - lasting few seconds.  She has an IUD and describes the pain as similar discomfort premenstrual. Mammogram has been done more than one year ago. - [facility]  Neurological: Negative.   Psychiatric/Behavioral: Negative.      Today's Vitals   10/27/20 1015  BP: 124/80  Pulse: 62  Temp: 98 F (36.7 C)  Weight: (!) 309 lb 6.4 oz (140.3 kg)  Height: _0  (1.575 m)  PainSc: 0-No pain   Body mass index is 56.59 kg/m.   Objective:  Physical Exam Constitutional:      General: She is not in acute distress.    Appearance: Normal appearance. She is obese.  Eyes:     Pupils: Pupils are equal, round, and reactive to light.  Cardiovascular:     Rate and Rhythm: Normal rate and regular rhythm.     Pulses: Normal pulses.     Heart sounds: Normal heart sounds. No murmur heard. Pulmonary:     Effort: Pulmonary effort is normal. No respiratory distress.     Breath sounds: Normal breath sounds. No wheezing.  Musculoskeletal:     Right lower leg: No edema.     Left lower leg: No edema.  Skin:    General: Skin is warm and dry.     Capillary Refill: Capillary refill takes less than 2 seconds.  Neurological:     General: No focal deficit present.     Mental Status: She is alert and oriented to person, place, and time.     Cranial Nerves: No cranial nerve deficit.  Psychiatric:        Mood and Affect: Mood normal.        Behavior: Behavior normal.        Thought Content: Thought content normal.        Judgment: Judgment normal.        Assessment And Plan:     1. Essential  hypertension Comments: Continue current medications  Continue follow up with Cardiology - BMP8+eGFR; Future  2. Depression with anxiety Comments: Continue with current medications Had episode with her recent Asthma Exacerbation  3. Abnormal glucose Comments: Stable, no current medications - Hemoglobin A1c; Future  4. Mild intermittent asthma, unspecified whether complicated Comments: Reports having an exacerbation and took several doses of prednisone from a friend She is encouraged to use her [place] regularly  - albuterol (VENTOLIN HFA) 108 (90 Base) MCG/ACT inhaler; Inhale 2 puffs into the lungs every 6 (six) hours as needed for wheezing or shortness of breath.  Dispense: 8 g; Refill: 2  5. Breast pain, left Comments: Tenderness to left chest at 12 o'clock - MM Digital Diagnostic Unilat L; Future  6. BMI 50.0-59.9, adult (HCC) Chronic  7. Encounter for screening mammogram for malignant neoplasm of breast Pt instructed on Self Breast Exam.According to ACOG guidelines Women aged 29 and older are recommended to get an annual mammogram. Form completed and given to patient contact the [facility] for appointment scheduing.  Pt encouraged to get annual mammogram, will send copy to [facility] once receive results - MM DIGITAL SCREENING BILATERAL; Future   No wheezing noted on physical exam  Patient was given opportunity to ask questions. Patient verbalized understanding of the plan and was able to repeat key elements of the plan. All questions were answered to their satisfaction.  [name], FNP   I, [name], FNP, have reviewed all documentation for this visit. The documentation on 10/27/20 for the exam, diagnosis, procedures, and orders are all accurate and complete.   IF YOU HAVE BEEN REFERRED TO A SPECIALIST, IT MAY TAKE 1-2 WEEKS TO SCHEDULE/PROCESS THE REFERRAL. IF YOU HAVE NOT HEARD FROM US/SPECIALIST IN TWO WEEKS, PLEASE GIVE [place] A CALL AT [phone] X  [phone].   THE  PATIENT IS ENCOURAGED TO PRACTICE SOCIAL DISTANCING DUE TO THE COVID-19 PANDEMIC.

## 2020-10-28 ENCOUNTER — Ambulatory Visit (HOSPITAL_BASED_OUTPATIENT_CLINIC_OR_DEPARTMENT_OTHER): Payer: 59 | Attending: Sports Medicine | Admitting: Physical Therapy

## 2020-10-28 ENCOUNTER — Encounter (HOSPITAL_BASED_OUTPATIENT_CLINIC_OR_DEPARTMENT_OTHER): Payer: Self-pay | Admitting: Physical Therapy

## 2020-10-28 ENCOUNTER — Other Ambulatory Visit: Payer: Self-pay | Admitting: Nurse Practitioner

## 2020-10-28 ENCOUNTER — Telehealth (HOSPITAL_COMMUNITY): Payer: Self-pay

## 2020-10-28 DIAGNOSIS — M25552 Pain in left hip: Secondary | ICD-10-CM | POA: Insufficient documentation

## 2020-10-28 DIAGNOSIS — F418 Other specified anxiety disorders: Secondary | ICD-10-CM

## 2020-10-28 DIAGNOSIS — M6281 Muscle weakness (generalized): Secondary | ICD-10-CM | POA: Insufficient documentation

## 2020-10-28 MED ORDER — ALPRAZOLAM 1 MG PO TABS: 1.0000 mg | ORAL_TABLET | Freq: Two times a day (BID) | ORAL | 2 refills | Status: DC | PRN

## 2020-10-28 NOTE — Telephone Encounter (Signed)
Called patient to see if she was interested in participating in the Cardiac Rehab Program. Patient stated yes. Patient will come in for orientation on 11/13/2020@10 :00am and will attend the 9:15am exercise class.

## 2020-10-29 ENCOUNTER — Other Ambulatory Visit: Payer: 59

## 2020-10-29 ENCOUNTER — Encounter (HOSPITAL_BASED_OUTPATIENT_CLINIC_OR_DEPARTMENT_OTHER): Payer: Self-pay | Admitting: Physical Therapy

## 2020-10-29 ENCOUNTER — Other Ambulatory Visit: Payer: Self-pay | Admitting: Nurse Practitioner

## 2020-10-29 DIAGNOSIS — F418 Other specified anxiety disorders: Secondary | ICD-10-CM

## 2020-10-29 DIAGNOSIS — N644 Mastodynia: Secondary | ICD-10-CM

## 2020-10-29 NOTE — Therapy (Signed)
[facility] [address] [place], [place], [address] Phone: [phone]   Fax:  [phone]  Physical Therapy Treatment  Patient Details  Name: Samantha Romero MRN: [id number] Date of Birth: February 15, 1976 Referring Provider (PT): Dr. Reino Bellis   Encounter Date: 10/28/2020   PT End of Session - 10/29/20 0046     Visit Number 3    Number of Visits 12    Date for PT Re-Evaluation 11/13/20    Authorization Type [employer]    PT Start Time 1410    PT Stop Time 1441    PT Time Calculation (min) 31 min    Activity Tolerance Patient tolerated treatment well    Behavior During Therapy [facility] for tasks assessed/performed             Past Medical History:  Diagnosis Date   Anxiety    panic attacks   Asthma    CHF (congestive heart failure), NYHA class II, acute, systolic (HCC) 10/06/2018   Depression    Fibromyalgia    Hypertension    no meds currently   No pertinent past medical history    Pneumonia    hx of    Sinus problem     Past Surgical History:  Procedure Laterality Date   ESOPHAGOGASTRODUODENOSCOPY N/A 11/26/2014   Procedure: ESOPHAGOGASTRODUODENOSCOPY (EGD);  Surgeon: Ovidio Kin, MD;  Location: [facility];  Service: General;  Laterality: N/A;   GASTRIC BANDING PORT REVISION  08/24/11   LAPAROSCOPIC GASTRIC BANDING  12/26/2007   LAPAROSCOPIC LYSIS OF ADHESIONS N/A 01/06/2015   Procedure: LAPAROSCOPIC LYSIS OF ADHESIONS;  Surgeon: Luretha Murphy, MD;  Location: [facility];  Service: General;  Laterality: N/A;   LAPAROSCOPIC REPAIR AND REMOVAL OF GASTRIC BAND N/A 01/06/2015   Procedure: LAPAROSCOPIC RESIGHTING OF GASTRIC Band PORT;  Surgeon: Luretha Murphy, MD;  Location: [facility];  Service: General;  Laterality: N/A;   NO PAST SURGERIES      There were no vitals filed for this visit.   Subjective Assessment - 10/28/20 1410     Subjective Pt reports she had congestion, chest pain, and difficulty  breathing appro 2 weeks ago.  She was scared to drive so she called EMS.  Pt had an EKG and was taken to ER at [facility].  Pt reports having 4 EKGs and blood work and everything looked fine.  Pt was dx'd with an asthma and panic attack.  Pt was put on prednisone and finished it last Friday.  pt states she is feeling much better.  Pt reports her hip is feeling better and she hasn't used the heating pad in 4 days.  Pt reports she is able to perform stairs better.  pt able to walk in stores and shop with less pain.  pt also able to clean her home with less pain.  pt states she felt great after prior Rx.  Pt saw MD yesterday and had a good report.  She states MD listened to her lungs and took her BP.  Pt reports MD released to her normal activities.  Pt hasn't used an inhaler in 5 days.    Pertinent History CHF(starting cardiac rehab in August), Morbid Obesity, fibromyalgia,  Hx of lap band and revision surgery    Currently in Pain? No/denies               Pt seen for aquatic therapy today.  Treatment took place in water 3.5-4 ft in depth at the [facility]  pool. Temp of water was 91.  Pt entered/exited the pool via stairs with a reciprocal pattern independently with bilat rail.   Pt requires buoyancy for support and to offload joints with strengthening exercises. Viscosity of the water is needed for resistance of strengthening; water current perturbations provides challenge to standing balance unsupported, requiring increased core activation. Introduction to water. Had patient stand at different levels so she could feel the bouncy    Pt ambulated fwd x 2 laps, sidepstepped x 2 laps, and ambulated bwd x 2 laps. Pt performed: -marching x10 reps, squats x 10 reps, hip 3 way (flex, abd, ext) x10 reps each -seated bicycles 2x10 reps and flutter kicks 1x10 reps.  Seated piriformis stretch 3x20-30 sec bilat     PT Short Term Goals - 10/03/20 0800       PT SHORT TERM GOAL #1   Title Pt  will be independent and compliant with HEP for improved pain, strength, ROM, and function.    Time 3    Period Weeks    Status New    Target Date 10/23/20      PT SHORT TERM GOAL #2   Title Pt will demo improved L hip ER AROM by 10 deg for improved flexibilty and mobility with daily activities.    Baseline L hip ER AROM:  28 deg.    Time 3    Period Weeks    Status New    Target Date 10/23/20      PT SHORT TERM GOAL #3   Title Pt will report at least a 25% improvement in pain with ambulation and daily mobility.    Time 3    Period Weeks    Target Date 10/23/20      PT SHORT TERM GOAL #4   Title Pt will tolerate aquatic exercises well without adverse effects for improved strength, mobility, and tolerance to activity.    Time 3    Period Weeks    Status New    Target Date 10/23/20               PT Long Term Goals - 10/03/20 0805       PT LONG TERM GOAL #1   Title Pt will be able to perform her normal household chores without significant pain and without having to stop and rest.    Baseline Pt has to stop intermittently while performing household chore due to pain.    Time 6    Period Weeks    Status New    Target Date 11/13/20      PT LONG TERM GOAL #2   Title Pt will report she is able to ambulate community distance without significant pain or difficulty.    Time 3    Period Weeks    Status New    Target Date 11/13/20      PT LONG TERM GOAL #3   Title Pt will be able to shop in [employer] without significant pain and without having to push the cart    Baseline Pt pushes the shopping cart in [employer] while shopping.    Time 3    Period Weeks    Status New    Target Date 11/13/20      PT LONG TERM GOAL #4   Title Pt will demo improved L hip extension strength to 5/5 MMT for improved strength and tolerance to daily functional mobility and household chores.    Baseline L hip ext strength:  4/5 MMT  Time 6    Period Weeks    Status New    Target Date 11/13/20                    Plan - 10/28/20 1422     Clinical Impression Statement Rx time limited due to pt arriving late to her scheduled time.  Pt has been absent from PT for close to 3 weeks due to having an asthma and panic attack.  Pt is doing much better and has seen MD.  Pt reports she is fine to return to PT and her normal activities.  Pt was put on prednisone and has completed it.  Pt reports much improved function and sx's in hip reporting improved performance of stairs, walking in stores and shopping, and cleaning her home with less pain.  Pt performed exercises well with cuing and instruction for correct form.  Pt performed light intensity of exercises due to recent medical issue.  She responded well to rx reporting no pain and having no c/o's after Rx.  Pt states she felt fine after Rx.    Comorbidities CHF, morbid obesity, and fibromyalgia    PT Treatment/Interventions ADLs/Self Care Home Management;Aquatic Therapy;Cryotherapy;Electrical Stimulation;Moist Heat;Ultrasound;Neuromuscular re-education;Therapeutic exercise;Therapeutic activities;Functional mobility training;Stair training;Gait training;Patient/family education;Manual techniques;Dry needling;Passive range of motion    PT Next Visit Plan Land therapy next visit.  Possi    PT Home Exercise Plan Access Code: [email or web address]    Consulted and Agree with Plan of Care Patient             Patient will benefit from skilled therapeutic intervention in order to improve the following deficits and impairments:  Decreased activity tolerance, Decreased mobility, Decreased endurance, Decreased range of motion, Decreased strength, Difficulty walking, Impaired flexibility, Pain  Visit Diagnosis: Pain in left hip  Muscle weakness (generalized)     Problem List Patient Active Problem List   Diagnosis Date Noted   Left hip pain 09/30/2020   Environmental and seasonal allergies 07/24/2019   Asthma 10/06/2018   Hypertensive urgency  10/06/2018   GERD (gastroesophageal reflux disease) 10/06/2018   Depression with anxiety 10/06/2018   CHF (congestive heart failure), NYHA class II, acute, systolic (HCC) 10/06/2018   Shortness of breath    Cardiomegaly    Snoring 10/04/2018   Essential hypertension 08/14/2018   Moderate persistent asthma with acute exacerbation 08/14/2018   Fibromyalgia 11/18/2016   Class 3 severe obesity due to excess calories without serious comorbidity with body mass index (BMI) of 45.0 to 49.9 in adult (HCC) 11/18/2016   Adjustment reaction with anxiety and depression 11/18/2016   Chronic bilateral low back pain without sciatica 09/22/2015   Myofascial pain 09/22/2015   Lapband port leaking-REVISED May 2013 09/10/2011   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

## 2020-10-30 ENCOUNTER — Ambulatory Visit (HOSPITAL_BASED_OUTPATIENT_CLINIC_OR_DEPARTMENT_OTHER): Payer: 59 | Admitting: Physical Therapy

## 2020-10-30 ENCOUNTER — Encounter (HOSPITAL_BASED_OUTPATIENT_CLINIC_OR_DEPARTMENT_OTHER): Payer: Self-pay | Admitting: Physical Therapy

## 2020-11-04 ENCOUNTER — Encounter (HOSPITAL_BASED_OUTPATIENT_CLINIC_OR_DEPARTMENT_OTHER): Payer: Self-pay | Admitting: Physical Therapy

## 2020-11-04 ENCOUNTER — Other Ambulatory Visit: Payer: Self-pay

## 2020-11-04 ENCOUNTER — Ambulatory Visit (HOSPITAL_BASED_OUTPATIENT_CLINIC_OR_DEPARTMENT_OTHER): Payer: 59 | Admitting: Physical Therapy

## 2020-11-04 ENCOUNTER — Telehealth: Payer: Self-pay

## 2020-11-04 DIAGNOSIS — M25552 Pain in left hip: Secondary | ICD-10-CM

## 2020-11-04 DIAGNOSIS — M6281 Muscle weakness (generalized): Secondary | ICD-10-CM | POA: Diagnosis not present

## 2020-11-04 NOTE — Therapy (Addendum)
[facility] [address] [place], [place], [address] Phone: [phone]   Fax:  [phone]  Physical Therapy Treatment  Patient Details  Name: Samantha Romero MRN: [id number] Date of Birth: 1976-03-28 Referring Provider (PT): Dr. Reino Bellis   Encounter Date: 11/04/2020   PT End of Session - 11/04/20 1447     Visit Number 4    Number of Visits 12    Date for PT Re-Evaluation 11/13/20    PT Start Time 1349    PT Stop Time 1434    PT Time Calculation (min) 45 min    Activity Tolerance Patient tolerated treatment well    Behavior During Therapy [facility] for tasks assessed/performed             Past Medical History:  Diagnosis Date   Anxiety    panic attacks   Asthma    CHF (congestive heart failure), NYHA class II, acute, systolic (HCC) 10/06/2018   Depression    Fibromyalgia    Hypertension    no meds currently   No pertinent past medical history    Pneumonia    hx of    Sinus problem     Past Surgical History:  Procedure Laterality Date   ESOPHAGOGASTRODUODENOSCOPY N/A 11/26/2014   Procedure: ESOPHAGOGASTRODUODENOSCOPY (EGD);  Surgeon: Ovidio Kin, MD;  Location: [facility];  Service: General;  Laterality: N/A;   GASTRIC BANDING PORT REVISION  08/24/11   LAPAROSCOPIC GASTRIC BANDING  12/26/2007   LAPAROSCOPIC LYSIS OF ADHESIONS N/A 01/06/2015   Procedure: LAPAROSCOPIC LYSIS OF ADHESIONS;  Surgeon: Luretha Murphy, MD;  Location: [facility];  Service: General;  Laterality: N/A;   LAPAROSCOPIC REPAIR AND REMOVAL OF GASTRIC BAND N/A 01/06/2015   Procedure: LAPAROSCOPIC RESIGHTING OF GASTRIC Band PORT;  Surgeon: Luretha Murphy, MD;  Location: [facility];  Service: General;  Laterality: N/A;   NO PAST SURGERIES      There were no vitals filed for this visit.   Subjective Assessment - 11/04/20 1355     Subjective Pt denies any adverse effects after prior Rx though states she could tell she worked out.  Pt denies pain in  hip currently.  Pt states she's doing well.  pt is able to walk in stores and shop with less pain.  Pt reports improved ambulation distance and she was able to take all of her trash cans out without hip pain.    Pertinent History CHF(starting cardiac rehab in August), Morbid Obesity, fibromyalgia,  Hx of lap band and revision surgery    Currently in Pain? No/denies              Pt seen for aquatic therapy today.  Treatment took place in water 3.5-4 ft in depth at the [facility] pool. Temp of water was 94.  Pt entered/exited the pool via stairs with a reciprocal pattern independently with bilat rail.   Pt ambulated fwd x 3 laps, sidepstepped x 3 laps, and ambulated bwd x 3 laps. Pt performed: -marching 2x10 reps, squats 2 x 10 reps, hip 3 way (flex, abd, ext) 2 x 10 reps each -seated bicycles 2x20 reps and flutter kicks 2x10 reps.  Pt requires buoyancy for support and to offload joints with strengthening exercises. Viscosity of the water is needed for resistance of strengthening; water current perturbations provides challenge to standing balance unsupported, requiring increased core activation. Introduction to water.        PT Education - 11/04/20 1446     Education Details  Educated pt concerning POC.  Instructed pt in correct form and appropriate depth of water.    Person(s) Educated Patient    Methods Explanation;Demonstration;Verbal cues    Comprehension Verbalized understanding;Returned demonstration              PT Short Term Goals - 10/03/20 0800       PT SHORT TERM GOAL #1   Title Pt will be independent and compliant with HEP for improved pain, strength, ROM, and function.    Time 3    Period Weeks    Status New    Target Date 10/23/20      PT SHORT TERM GOAL #2   Title Pt will demo improved L hip ER AROM by 10 deg for improved flexibilty and mobility with daily activities.    Baseline L hip ER AROM:  28 deg.    Time 3    Period Weeks    Status  New    Target Date 10/23/20      PT SHORT TERM GOAL #3   Title Pt will report at least a 25% improvement in pain with ambulation and daily mobility.    Time 3    Period Weeks    Target Date 10/23/20      PT SHORT TERM GOAL #4   Title Pt will tolerate aquatic exercises well without adverse effects for improved strength, mobility, and tolerance to activity.    Time 3    Period Weeks    Status New    Target Date 10/23/20               PT Long Term Goals - 10/03/20 0805       PT LONG TERM GOAL #1   Title Pt will be able to perform her normal household chores without significant pain and without having to stop and rest.    Baseline Pt has to stop intermittently while performing household chore due to pain.    Time 6    Period Weeks    Status New    Target Date 11/13/20      PT LONG TERM GOAL #2   Title Pt will report she is able to ambulate community distance without significant pain or difficulty.    Time 3    Period Weeks    Status New    Target Date 11/13/20      PT LONG TERM GOAL #3   Title Pt will be able to shop in [employer] without significant pain and without having to push the cart    Baseline Pt pushes the shopping cart in [employer] while shopping.    Time 3    Period Weeks    Status New    Target Date 11/13/20      PT LONG TERM GOAL #4   Title Pt will demo improved L hip extension strength to 5/5 MMT for improved strength and tolerance to daily functional mobility and household chores.    Baseline L hip ext strength:  4/5 MMT    Time 6    Period Weeks    Status New    Target Date 11/13/20                   Plan - 11/04/20 1442     Clinical Impression Statement Pt is progressing well functionally reporting improved ambulation distance, tolerance to activity, and pain.  Pt was able to take all 3 trash cans out without hip pain.  Pt presents to Rx  reporting no hip pain.  pt responded well to prior Aquatic Rx and PT increased intensity/volume of  today's session with increased sets/reps.  She performed exercises well without c/o's.  pt responded well to Rx having no pain after Rx.  Pt should cont to benefit from cont skilled PT services to address ongoing goals and to restore PLOF.    Comorbidities CHF, morbid obesity, and fibromyalgia    PT Treatment/Interventions ADLs/Self Care Home Management;Aquatic Therapy;Cryotherapy;Electrical Stimulation;Moist Heat;Ultrasound;Neuromuscular re-education;Therapeutic exercise;Therapeutic activities;Functional mobility training;Stair training;Gait training;Patient/family education;Manual techniques;Dry needling;Passive range of motion    PT Next Visit Plan [place] therapy next visit.  PN next visit.  Review HEP.    PT Home Exercise Plan Access Code: [email or web address]    Consulted and Agree with Plan of Care Patient             Patient will benefit from skilled therapeutic intervention in order to improve the following deficits and impairments:  Decreased activity tolerance, Decreased mobility, Decreased endurance, Decreased range of motion, Decreased strength, Difficulty walking, Impaired flexibility, Pain  Visit Diagnosis: Pain in left hip  Muscle weakness (generalized)     Problem List Patient Active Problem List   Diagnosis Date Noted   Left hip pain 09/30/2020   Environmental and seasonal allergies 07/24/2019   Asthma 10/06/2018   Hypertensive urgency 10/06/2018   GERD (gastroesophageal reflux disease) 10/06/2018   Depression with anxiety 10/06/2018   CHF (congestive heart failure), NYHA class II, acute, systolic (HCC) 10/06/2018   Shortness of breath    Cardiomegaly    Snoring 10/04/2018   Essential hypertension 08/14/2018   Moderate persistent asthma with acute exacerbation 08/14/2018   Fibromyalgia 11/18/2016   Class 3 severe obesity due to excess calories without serious comorbidity with body mass index (BMI) of 45.0 to 49.9 in adult (HCC) 11/18/2016   Adjustment reaction with anxiety  and depression 11/18/2016   Chronic bilateral low back pain without sciatica 09/22/2015   Myofascial pain 09/22/2015   Lapband port leaking-REVISED May 2013 09/10/2011   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  Pt was progressing well functionally reporting improved ambulation distance, tolerance to activity, and pain.    Remaining deficits: See above for last visit.   Education / Equipment: See above for last visit.   Patient was seen in PT from 10/02/20 - 11/04/20.  Pt had to cancel some appointments due to work meetings.  Pt sent a message about her work meetings and PT has not heard back from pt since.  Unable to assess current functional status or goals due to pt not being present at discharge.  Pt has a HEP. Patient is being discharged due to  not scheduling any further PT after cancelling.  Audie Clear III PT, DPT 02/23/21 8:30 AM  PHYSICAL THERAPY DISCHARGE SUMMARY  Visits from Start of Care: 4  Current functional level related to goals / functional outcomes: Pt was making progress with function.  Unable to assess current functional status or goals due to pt not being present at discharge.   Remaining deficits: See above   Education / Equipment: See above   Pt was seen in PT for 4 visits from 7/9 - 11/04/20.  She had multiple cancellations and No-shows.  Pt cancelled her last scheduled appt and did not call  back to reschedule.  She will be considered discharged at this time.    Audie Clear III PT, DPT 03/19/21 1:48 PM     [facility] [address] [place], [place], [address] Phone: [phone]   Fax:  [phone]  Name: LAKAIA LESCH MRN: [id number] Date of Birth: September 28, 1975

## 2020-11-05 ENCOUNTER — Telehealth: Payer: Self-pay

## 2020-11-05 NOTE — Telephone Encounter (Signed)
Called pt to notify, no PA is needed for Ozempic. Pharmacy also aware.

## 2020-11-06 ENCOUNTER — Ambulatory Visit (HOSPITAL_BASED_OUTPATIENT_CLINIC_OR_DEPARTMENT_OTHER): Payer: 59 | Admitting: Physical Therapy

## 2020-11-06 ENCOUNTER — Encounter (HOSPITAL_BASED_OUTPATIENT_CLINIC_OR_DEPARTMENT_OTHER): Payer: Self-pay | Admitting: Physical Therapy

## 2020-11-06 ENCOUNTER — Telehealth (HOSPITAL_COMMUNITY): Payer: Self-pay | Admitting: *Deleted

## 2020-11-06 NOTE — Telephone Encounter (Signed)
-----   Message from Lyn Records, MD sent at 11/05/2020  7:53 PM EDT ----- Regarding: RE: Office follow up Yes, okay to proceed. ----- Message ----- From: Chelsea Aus, RN Sent: 10/31/2020   3:40 PM EDT To: Lyn Records, MD Subject: Office follow up                                Dr. Katrinka Blazing,  Samantha Romero was referred to Cardiac rehab by you earlier this year.  Finally able to schedule her for orientation on next Thursday.  I see in your office follow up from 1/10 that she was to follow up in 6 months.  I do not see that she was seen or had an appt with cardiology scheduled.  She had a brief ER visit for chest pain on 7/13 deemed not cardiac.  Seen to establish care by PCP on 8/1.  Ok to proceed with cardiac rehab or should she wait and complete cardiology follow up?  Thanks so much for your advisement  Karlene Lineman RN, BSN Cardiac and Pulmonary Rehab Nurse Navigator

## 2020-11-11 ENCOUNTER — Encounter: Payer: Self-pay | Admitting: Nurse Practitioner

## 2020-11-11 ENCOUNTER — Ambulatory Visit: Payer: BC Managed Care – PPO | Admitting: Sports Medicine

## 2020-11-11 ENCOUNTER — Ambulatory Visit (HOSPITAL_BASED_OUTPATIENT_CLINIC_OR_DEPARTMENT_OTHER): Payer: 59 | Admitting: Physical Therapy

## 2020-11-11 NOTE — Progress Notes (Deleted)
PCP: [name], FNP  Subjective:   HPI: Patient is a 45 y.o. female here for left hip pain 6 week follow up ***. She has been to PT doing aquatic therapy and it seems to be helping. They've given her a HEP which she is doing as well. XR of hip ordered but she has not gotten x-rays yet.   Per last visit 45 yo woman presents with left hip pain that has been problematic for several weeks. There was no inciting incident that patient can recall, but the pain had an insidious onset that has gotten worse over several weeks. She particularly notices the pain when walking, for example when walking around [employer]. This has limited her exercise due to pain.  Pain is dull and aching on her lateral hip and buttock, it does not radiate. She has occasional very minimal anterior groin pain, but this is not as common as the lateral posterior pain. Patient has CHF and will be starting formal cardiac rehab in August. She lives alone and is concerned about doing exercises in case something were to happen. Patient is actively in process of losing weight with goal to increase exercise and improve diet. ***  Past Medical History:  Diagnosis Date   Anxiety    panic attacks   Asthma    CHF (congestive heart failure), NYHA class II, acute, systolic (HCC) 10/06/2018   Depression    Fibromyalgia    Hypertension    no meds currently   No pertinent past medical history    Pneumonia    hx of    Sinus problem     Current Outpatient Medications on File Prior to Visit  Medication Sig Dispense Refill   albuterol (PROVENTIL) (2.5 MG/3ML) 0.083% nebulizer solution Take 3 mLs (2.5 mg total) by nebulization every 6 (six) hours as needed for wheezing or shortness of breath. 150 mL 1   albuterol (VENTOLIN HFA) 108 (90 Base) MCG/ACT inhaler Inhale 2 puffs into the lungs every 6 (six) hours as needed for wheezing or shortness of breath. 8 g 2   ALPRAZolam (XANAX) 1 MG tablet Take 1 tablet (1 mg total) by mouth 2 (two) times  daily as needed for anxiety. 40 tablet 2   azelastine (ASTELIN) 0.1 % nasal spray Place 2 sprays into both nostrils 2 (two) times daily. 30 mL 5   buPROPion (WELLBUTRIN XL) 150 MG 24 hr tablet Take 3 tablets (450 mg total) by mouth daily. 180 tablet 1   carvedilol (COREG) 25 MG tablet Take 1 tablet (25 mg total) by mouth 2 (two) times daily with a meal. 180 tablet 1   DULoxetine (CYMBALTA) 60 MG capsule Take 1 capsule (60 mg total) by mouth daily. 90 capsule 1   empagliflozin (JARDIANCE) 10 MG TABS tablet TAKE ONE TABLET BY MOUTH DAILY BEFORE BREAKFAST (Patient taking differently: Take 10 mg by mouth daily.) 90 tablet 1   fluticasone (FLONASE) 50 MCG/ACT nasal spray Place 1 spray into both nostrils every morning. (Patient taking differently: Place 1 spray into both nostrils daily.) 16 g 1   Fluticasone-Salmeterol 232-14 MCG/ACT AEPB Inhale 1 puff into the lungs in the morning and at bedtime. 1 each 5   furosemide (LASIX) 40 MG tablet Take 1 tablet (40 mg total) by mouth 2 (two) times daily. 180 tablet 1   levocetirizine (XYZAL) 5 MG tablet Take 1 tablet (5 mg total) by mouth every evening. 90 tablet 0   montelukast (SINGULAIR) 10 MG tablet TAKE 1 TABLET BY MOUTH  EVERYDAY AT BEDTIME (Patient taking differently: Take 10 mg by mouth at bedtime.) 90 tablet 2   omeprazole (PRILOSEC) 40 MG capsule TAKE 1 CAPSULE BY MOUTH EVERY DAY (Patient taking differently: Take 40 mg by mouth daily.) 90 capsule 1   sacubitril-valsartan (ENTRESTO) 97-103 MG Take 1 tablet by mouth 2 (two) times daily. 60 tablet 6   Semaglutide,0.25 or 0.5MG /DOS, (OZEMPIC, 0.25 OR 0.5 MG/DOSE,) 2 MG/1.5ML SOPN Inject 0.5 mg into the skin once a week. (Patient taking differently: Inject 0.5 mg into the skin every Thursday.) 4.5 mL 1   spironolactone (ALDACTONE) 25 MG tablet Take 1 tablet (25 mg total) by mouth daily. 90 tablet 1   [DISCONTINUED] mometasone-formoterol (DULERA) 200-5 MCG/ACT AERO Inhale 2 puffs into the lungs 2 (two) times a  day. 13 g 5   No current facility-administered medications on file prior to visit.    Past Surgical History:  Procedure Laterality Date   ESOPHAGOGASTRODUODENOSCOPY N/A 11/26/2014   Procedure: ESOPHAGOGASTRODUODENOSCOPY (EGD);  Surgeon: Ovidio Kin, MD;  Location: [facility];  Service: General;  Laterality: N/A;   GASTRIC BANDING PORT REVISION  08/24/11   LAPAROSCOPIC GASTRIC BANDING  12/26/2007   LAPAROSCOPIC LYSIS OF ADHESIONS N/A 01/06/2015   Procedure: LAPAROSCOPIC LYSIS OF ADHESIONS;  Surgeon: Luretha Murphy, MD;  Location: [facility];  Service: General;  Laterality: N/A;   LAPAROSCOPIC REPAIR AND REMOVAL OF GASTRIC BAND N/A 01/06/2015   Procedure: LAPAROSCOPIC RESIGHTING OF GASTRIC Band PORT;  Surgeon: Luretha Murphy, MD;  Location: [facility];  Service: General;  Laterality: N/A;   NO PAST SURGERIES      Allergies  Allergen Reactions   Grass Extracts [Gramineae Pollens]     Nasal congestion, post nasal drip    Shellfish Allergy Itching    In throat only.    Social History   Socioeconomic History   Marital status: Single    Spouse name: Not on file   Number of children: Not on file   Years of education: Not on file   Highest education level: Not on file  Occupational History   Not on file  Tobacco Use   Smoking status: Never   Smokeless tobacco: Never  Vaping Use   Vaping Use: Never used  Substance and Sexual Activity   Alcohol use: Never   Drug use: Never   Sexual activity: Not Currently    Birth control/protection: I.U.D.    Comment: Mirena  Other Topics Concern   Not on file  Social History Narrative   Not on file   Social Determinants of Health   Financial Resource Strain: Not on file  Food Insecurity: Not on file  Transportation Needs: Not on file  Physical Activity: Not on file  Stress: Not on file  Social Connections: Not on file  Intimate Partner Violence: Not on file    Family History  Problem Relation Age of Onset   Ulcers Mother     Hypertension Mother    Cancer Maternal Uncle        lung   Cancer Maternal Grandfather        lung and back    There were no vitals taken for this visit.  No flowsheet data found.  No flowsheet data found.  Review of Systems: See HPI above.     Objective:  Physical Exam:  Gen: NAD, comfortable in exam room  ***   Assessment & Plan:  1. *** Left hip pain Patient with pelvic girdle weakness, positive trendelenburg, causing pain over lateral left buttock  and hip. Recommend formal physical therapy program, evaluate for aquatherapy. Because there is ROM limitation on IR on left side and occasional groin pain, will obtain plain film radiography to r/o OA, however, OA likely does not explain current symptms as well as pelvic girdle weakness. - PT - L hip x-ray - F/U 6 weeks

## 2020-11-13 ENCOUNTER — Ambulatory Visit (HOSPITAL_BASED_OUTPATIENT_CLINIC_OR_DEPARTMENT_OTHER): Payer: 59 | Admitting: Physical Therapy

## 2020-11-13 ENCOUNTER — Inpatient Hospital Stay (HOSPITAL_COMMUNITY): Admission: RE | Admit: 2020-11-13 | Payer: 59 | Source: Ambulatory Visit

## 2020-11-13 ENCOUNTER — Telehealth (HOSPITAL_COMMUNITY): Payer: Self-pay | Admitting: *Deleted

## 2020-11-13 IMAGING — DX PORTABLE CHEST - 1 VIEW
1 series · 1 of 1 positions shown · non-contrast
Comparison: 12/16/2014

CLINICAL DATA: Increasing wheezing and shortness of breath

EXAM:
PORTABLE CHEST 1 VIEW

[chest ap]
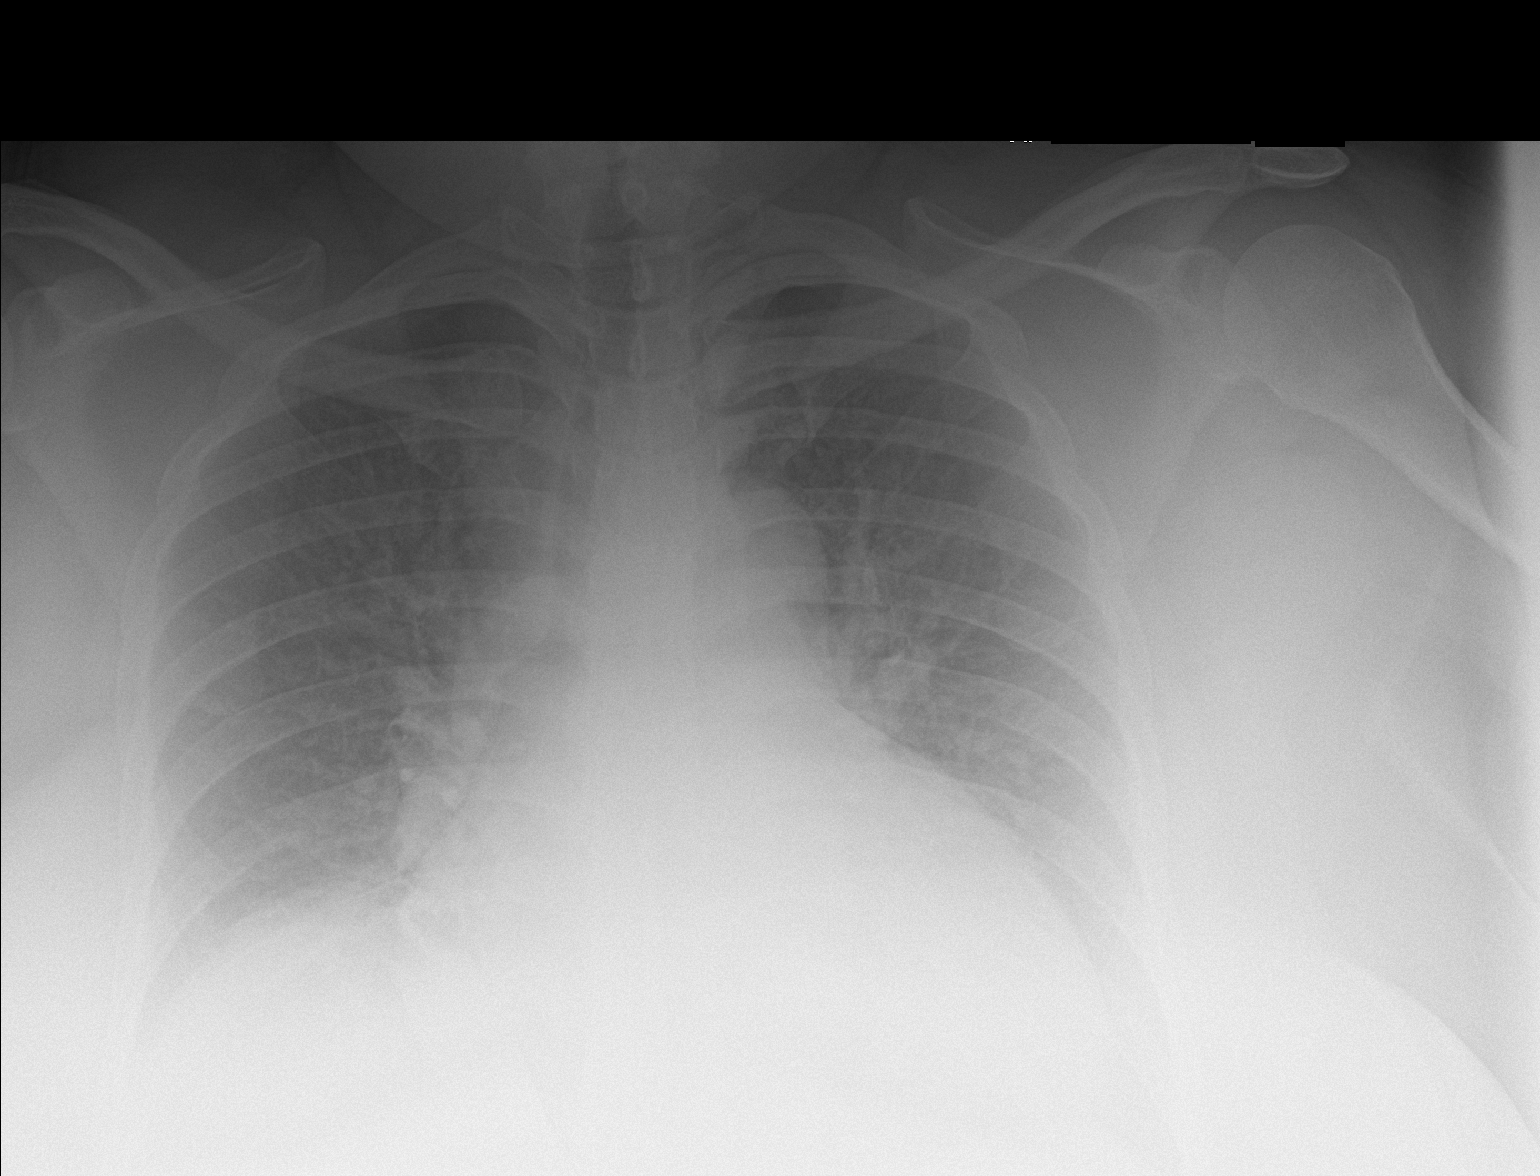

[1 of 1 positions shown; findings below may reference images not displayed]

FINDINGS: Cardiac shadow is enlarged but accentuated by the portable
technique. Mild vascular congestion is noted without interstitial
edema. No definitive infiltrate is seen. No bony abnormality is
noted.
IMPRESSION: Mild vascular congestion without interstitial edema.

## 2020-11-13 NOTE — Telephone Encounter (Signed)
Received message via voicemail message left on 8/17 from Samantha Romero indicating that she was exposed to her boyfriend who tested positive on - 8/14.  Her last contact with her boyfriend was 2 days ago.  Not within the window for her to test.  Samantha Romero denies any symptoms. Called and left message for [name] letting her know that she will need to reschedule her orientation appt for cardiac rehab later this morning. May reschedule upon negative test and no symptoms. Alanson Aly, BSN Cardiac and Emergency planning/management officer

## 2020-11-17 ENCOUNTER — Ambulatory Visit (HOSPITAL_COMMUNITY): Payer: 59

## 2020-11-19 ENCOUNTER — Ambulatory Visit (HOSPITAL_COMMUNITY): Payer: 59

## 2020-11-20 ENCOUNTER — Ambulatory Visit (HOSPITAL_COMMUNITY): Payer: 59

## 2020-11-21 ENCOUNTER — Other Ambulatory Visit: Payer: Self-pay | Admitting: Nurse Practitioner

## 2020-11-21 ENCOUNTER — Ambulatory Visit (HOSPITAL_COMMUNITY): Payer: 59

## 2020-11-24 ENCOUNTER — Ambulatory Visit (HOSPITAL_COMMUNITY): Payer: 59

## 2020-11-26 ENCOUNTER — Ambulatory Visit (HOSPITAL_COMMUNITY): Payer: 59

## 2020-11-26 DIAGNOSIS — Z01419 Encounter for gynecological examination (general) (routine) without abnormal findings: Secondary | ICD-10-CM | POA: Diagnosis not present

## 2020-11-26 DIAGNOSIS — Z6841 Body Mass Index (BMI) 40.0 and over, adult: Secondary | ICD-10-CM | POA: Diagnosis not present

## 2020-11-26 DIAGNOSIS — Z1231 Encounter for screening mammogram for malignant neoplasm of breast: Secondary | ICD-10-CM | POA: Diagnosis not present

## 2020-11-28 ENCOUNTER — Ambulatory Visit (HOSPITAL_COMMUNITY): Payer: 59

## 2020-12-03 ENCOUNTER — Ambulatory Visit (HOSPITAL_COMMUNITY): Payer: 59

## 2020-12-05 ENCOUNTER — Ambulatory Visit (HOSPITAL_COMMUNITY): Payer: 59

## 2020-12-08 ENCOUNTER — Ambulatory Visit (HOSPITAL_COMMUNITY): Payer: 59

## 2020-12-09 ENCOUNTER — Encounter (HOSPITAL_COMMUNITY): Payer: Self-pay

## 2020-12-09 ENCOUNTER — Telehealth (HOSPITAL_COMMUNITY): Payer: Self-pay

## 2020-12-09 NOTE — Telephone Encounter (Signed)
Attempted to call patient in regards to Cardiac Rehab - LM on VM Mailed letter 

## 2020-12-10 ENCOUNTER — Ambulatory Visit (HOSPITAL_COMMUNITY): Payer: 59

## 2020-12-12 ENCOUNTER — Ambulatory Visit (HOSPITAL_COMMUNITY): Payer: 59

## 2020-12-15 ENCOUNTER — Ambulatory Visit (HOSPITAL_COMMUNITY): Payer: 59

## 2020-12-17 ENCOUNTER — Ambulatory Visit (HOSPITAL_COMMUNITY): Payer: 59

## 2020-12-18 DIAGNOSIS — L74510 Primary focal hyperhidrosis, axilla: Secondary | ICD-10-CM | POA: Diagnosis not present

## 2020-12-18 DIAGNOSIS — L814 Other melanin hyperpigmentation: Secondary | ICD-10-CM | POA: Diagnosis not present

## 2020-12-19 ENCOUNTER — Ambulatory Visit (HOSPITAL_COMMUNITY): Payer: 59

## 2020-12-22 ENCOUNTER — Ambulatory Visit (HOSPITAL_COMMUNITY): Payer: 59

## 2020-12-24 ENCOUNTER — Ambulatory Visit (HOSPITAL_COMMUNITY): Payer: 59

## 2020-12-25 NOTE — Telephone Encounter (Signed)
Error

## 2020-12-26 ENCOUNTER — Ambulatory Visit (HOSPITAL_COMMUNITY): Payer: 59

## 2020-12-26 ENCOUNTER — Telehealth (HOSPITAL_COMMUNITY): Payer: Self-pay

## 2020-12-26 NOTE — Telephone Encounter (Signed)
No repsonse from pt.  Closed referral  

## 2020-12-29 ENCOUNTER — Ambulatory Visit (HOSPITAL_COMMUNITY): Payer: 59

## 2020-12-31 ENCOUNTER — Ambulatory Visit (HOSPITAL_COMMUNITY): Payer: 59

## 2021-01-02 ENCOUNTER — Ambulatory Visit (HOSPITAL_COMMUNITY): Payer: 59

## 2021-01-05 ENCOUNTER — Other Ambulatory Visit: Payer: Self-pay | Admitting: Nurse Practitioner

## 2021-01-05 ENCOUNTER — Ambulatory Visit (HOSPITAL_COMMUNITY): Payer: 59

## 2021-01-05 DIAGNOSIS — F418 Other specified anxiety disorders: Secondary | ICD-10-CM

## 2021-01-07 ENCOUNTER — Ambulatory Visit (HOSPITAL_COMMUNITY): Payer: 59

## 2021-01-09 ENCOUNTER — Ambulatory Visit (HOSPITAL_COMMUNITY): Payer: 59

## 2021-01-25 ENCOUNTER — Other Ambulatory Visit: Payer: Self-pay | Admitting: Nurse Practitioner

## 2021-01-26 ENCOUNTER — Encounter: Payer: Self-pay | Admitting: Nurse Practitioner

## 2021-01-26 ENCOUNTER — Other Ambulatory Visit: Payer: Self-pay

## 2021-01-26 ENCOUNTER — Ambulatory Visit: Payer: 59 | Admitting: Nurse Practitioner

## 2021-01-26 VITALS — BP 130/72 | HR 97 | Temp 98.5°F | Ht 62.2 in | Wt 310.8 lb

## 2021-01-26 DIAGNOSIS — Z6841 Body Mass Index (BMI) 40.0 and over, adult: Secondary | ICD-10-CM | POA: Diagnosis not present

## 2021-01-26 DIAGNOSIS — Z789 Other specified health status: Secondary | ICD-10-CM

## 2021-01-26 DIAGNOSIS — F329 Major depressive disorder, single episode, unspecified: Secondary | ICD-10-CM

## 2021-01-26 DIAGNOSIS — G4709 Other insomnia: Secondary | ICD-10-CM

## 2021-01-26 DIAGNOSIS — K591 Functional diarrhea: Secondary | ICD-10-CM

## 2021-01-26 DIAGNOSIS — Z566 Other physical and mental strain related to work: Secondary | ICD-10-CM | POA: Diagnosis not present

## 2021-01-26 MED ORDER — TRAZODONE HCL 50 MG PO TABS: 50.0000 mg | ORAL_TABLET | Freq: Every day | ORAL | 2 refills | Status: DC

## 2021-01-26 NOTE — Progress Notes (Signed)
I,Samantha Romero,acting as a Neurosurgeon for [employer], FNP.,have documented all relevant documentation on the behalf of Samantha Felts, FNP,as directed by  Samantha Felts, FNP while in the presence of Samantha Felts, FNP.  This visit occurred during the SARS-CoV-2 public health emergency.  Safety protocols were in place, including screening questions prior to the visit, additional usage of staff PPE, and extensive cleaning of exam room while observing appropriate contact time as indicated for disinfecting solutions.  Subjective:     Patient ID: Samantha Romero , female    DOB: September 21, 1975 , 45 y.o.   MRN: [id number]   Chief Complaint  Patient presents with   Hypertension    HPI  She is here today reporting having digestive issues, urge to vomit or having a bowel movement. At her job there are several issues and going through a restructuring. About one week ago she had seasonal allergy issues which she worked through, this was on last Tuesday. She had planned to take some time off and take care of things at home. She has been struggling with resting and eating. She is trying to work with them in a healthy weight. She states that every time she eats she has to go to the bathroom about 10 mins later. She is struggling to focus unsure if related to stress. She is not  currently taking Ozempic.  She is currently working for a [employer]ical [employer].   She is not taking her medications they way they should be taken and realizes this is a problem. She also feels she is having some depression. She is now using a CPAP (nasal). She reports about 10 years ago she went to ENT to check for blockages. She would like to be seen by another ENT to be evaluated. She feels the air is going through one nostril.    She would like to discuss some issues with her stomach.  She feels that this could be related to stress.   Wt Readings from Last 3 Encounters: 01/26/21 : (!) 310 lb 12.8 oz (141 kg) 10/27/20 : (!) 309 lb 6.4  oz (140.3 kg) 10/08/20 : (!) 315 lb (142.9 kg)      Past Medical History:  Diagnosis Date   Anxiety    panic attacks   Asthma    CHF (congestive heart failure), NYHA class II, acute, systolic (HCC) 10/06/2018   Depression    Fibromyalgia    Hypertension    no meds currently   No pertinent past medical history    Pneumonia    hx of    Sinus problem      Family History  Problem Relation Age of Onset   Ulcers Mother    Hypertension Mother    Cancer Maternal Uncle        lung   Cancer Maternal Grandfather        lung and back     Current Outpatient Medications:    albuterol (PROVENTIL) (2.5 MG/3ML) 0.083% nebulizer solution, Take 3 mLs (2.5 mg total) by nebulization every 6 (six) hours as needed for wheezing or shortness of breath., Disp: 150 mL, Rfl: 1   albuterol (VENTOLIN HFA) 108 (90 Base) MCG/ACT inhaler, Inhale 2 puffs into the lungs every 6 (six) hours as needed for wheezing or shortness of breath., Disp: 8 g, Rfl: 2   ALPRAZolam (XANAX) 1 MG tablet, Take 1 tablet (1 mg total) by mouth 2 (two) times daily as needed for anxiety., Disp: 40 tablet, Rfl: 2  azelastine (ASTELIN) 0.1 % nasal spray, Place 2 sprays into both nostrils 2 (two) times daily., Disp: 30 mL, Rfl: 5   buPROPion (WELLBUTRIN XL) 150 MG 24 hr tablet, TAKE 3 TABLETS BY MOUTH DAILY., Disp: 270 tablet, Rfl: 2   carvedilol (COREG) 25 MG tablet, Take 1 tablet (25 mg total) by mouth 2 (two) times daily with a meal., Disp: 180 tablet, Rfl: 1   DULoxetine (CYMBALTA) 60 MG capsule, Take 1 capsule (60 mg total) by mouth daily., Disp: 90 capsule, Rfl: 1   empagliflozin (JARDIANCE) 10 MG TABS tablet, TAKE ONE TABLET BY MOUTH DAILY BEFORE BREAKFAST (Patient taking differently: Take 10 mg by mouth daily.), Disp: 90 tablet, Rfl: 1   fluticasone (FLONASE) 50 MCG/ACT nasal spray, PLACE 1 SPRAY INTO BOTH NOSTRILS EVERY MORNING., Disp: 16 mL, Rfl: 1   Fluticasone-Salmeterol 232-14 MCG/ACT AEPB, Inhale 1 puff into the lungs  in the morning and at bedtime., Disp: 1 each, Rfl: 5   furosemide (LASIX) 40 MG tablet, Take 1 tablet (40 mg total) by mouth 2 (two) times daily., Disp: 180 tablet, Rfl: 1   levocetirizine (XYZAL) 5 MG tablet, Take 1 tablet (5 mg total) by mouth every evening., Disp: 90 tablet, Rfl: 0   montelukast (SINGULAIR) 10 MG tablet, TAKE 1 TABLET BY MOUTH EVERYDAY AT BEDTIME (Patient taking differently: Take 10 mg by mouth at bedtime.), Disp: 90 tablet, Rfl: 2   omeprazole (PRILOSEC) 40 MG capsule, TAKE 1 CAPSULE BY MOUTH EVERY DAY (Patient taking differently: Take 40 mg by mouth daily.), Disp: 90 capsule, Rfl: 1   sacubitril-valsartan (ENTRESTO) 97-103 MG, Take 1 tablet by mouth 2 (two) times daily., Disp: 60 tablet, Rfl: 6   Semaglutide,0.25 or 0.5MG /DOS, (OZEMPIC, 0.25 OR 0.5 MG/DOSE,) 2 MG/1.5ML SOPN, Inject 0.5 mg into the skin once a week. (Patient taking differently: Inject 0.5 mg into the skin every Thursday.), Disp: 4.5 mL, Rfl: 1   spironolactone (ALDACTONE) 25 MG tablet, Take 1 tablet (25 mg total) by mouth daily., Disp: 90 tablet, Rfl: 1   Allergies  Allergen Reactions   Grass Extracts [Gramineae Pollens]     Nasal congestion, post nasal drip    Shellfish Allergy Itching    In throat only.     Review of Systems  Constitutional: Negative.   Respiratory: Negative.    Cardiovascular: Negative.   Gastrointestinal:  Positive for constipation and diarrhea (After eating intermittent urge to have a bowel movement). Negative for abdominal distention, abdominal pain, nausea, rectal pain and vomiting.  Genitourinary: Negative.   Neurological: Negative.   Psychiatric/Behavioral: Negative.      Today's Vitals   01/26/21 1050  BP: 130/72  Pulse: 97  Temp: 98.5 F (36.9 C)  TempSrc: Oral  Weight: (!) 310 lb 12.8 oz (141 kg)  Height: 5' 2.2" (1.58 m)   Body mass index is 56.48 kg/m.  Wt Readings from Last 3 Encounters:  01/26/21 (!) 310 lb 12.8 oz (141 kg)  10/27/20 (!) 309 lb 6.4 oz  (140.3 kg)  10/08/20 (!) 315 lb (142.9 kg)    Objective:  Physical Exam Vitals reviewed.  Constitutional:      General: She is not in acute distress.    Appearance: Normal appearance. She is obese.  Cardiovascular:     Rate and Rhythm: Normal rate and regular rhythm.     Pulses: Normal pulses.     Heart sounds: Normal heart sounds. No murmur heard. Pulmonary:     Effort: Pulmonary effort is normal. No respiratory distress.  Breath sounds: Normal breath sounds. No wheezing.  Musculoskeletal:     Right lower leg: No edema.     Left lower leg: No edema.  Skin:    General: Skin is warm and dry.     Capillary Refill: Capillary refill takes less than 2 seconds.  Neurological:     General: No focal deficit present.     Mental Status: She is alert and oriented to person, place, and time.     Cranial Nerves: No cranial nerve deficit.     Motor: No weakness.  Psychiatric:        Mood and Affect: Mood normal.        Behavior: Behavior normal.        Thought Content: Thought content normal.        Judgment: Judgment normal.        Assessment And Plan:     1. Stress at work Referral made to psychology for counseling - Ambulatory referral to Psychology  2. BMI 50.0-59.9, adult [facility]) She is encouraged to strive for BMI less than 30 to decrease cardiac risk. Advised to aim for at least 150 minutes of exercise per week.  3. Difficulty with CPAP use She reports she is having difficulty with getting air to her left nostril. Will refer to ENT for further evaluation of this as this is causing her difficulty with CPAP.   - Ambulatory referral to ENT  4. Reactive depression Comments: Will refer to counseling for further evaluation, encouraged ashwaganda supplement. She is also on a high dose of Wellbutrin and Cymbalta and has been for some time with her current situation she would benefit from seeing a psychiatrist and psychology.  - Ambulatory referral to Psychology  5. Other  insomnia She would like to try Ashwaganda    I personally spent 30 minutes face-to-face and non-face-to-face in the care of this patient, which includes all pre-, intra-, discussion on her alternatives to stress responses and post visit time on the date of service.   Patient was given opportunity to ask questions. Patient verbalized understanding of the plan and was able to repeat key elements of the plan. All questions were answered to their satisfaction.  Samantha Felts, FNP   I, Samantha Felts, FNP, have reviewed all documentation for this visit. The documentation on 01/26/21 for the exam, diagnosis, procedures, and orders are all accurate and complete.   IF YOU HAVE BEEN REFERRED TO A SPECIALIST, IT MAY TAKE 1-2 WEEKS TO SCHEDULE/PROCESS THE REFERRAL. IF YOU HAVE NOT HEARD FROM US/SPECIALIST IN TWO WEEKS, PLEASE GIVE [place] A CALL AT [phone].   THE PATIENT IS ENCOURAGED TO PRACTICE SOCIAL DISTANCING DUE TO THE COVID-19 PANDEMIC.

## 2021-01-26 NOTE — Patient Instructions (Signed)

## 2021-01-29 ENCOUNTER — Encounter: Payer: Self-pay | Admitting: Nurse Practitioner

## 2021-01-30 ENCOUNTER — Other Ambulatory Visit: Payer: Self-pay | Admitting: Nurse Practitioner

## 2021-01-30 DIAGNOSIS — F418 Other specified anxiety disorders: Secondary | ICD-10-CM

## 2021-02-12 ENCOUNTER — Other Ambulatory Visit (HOSPITAL_COMMUNITY): Payer: Self-pay | Admitting: Surgery

## 2021-02-12 ENCOUNTER — Other Ambulatory Visit: Payer: Self-pay | Admitting: Surgery

## 2021-02-12 DIAGNOSIS — K909 Intestinal malabsorption, unspecified: Secondary | ICD-10-CM

## 2021-03-03 ENCOUNTER — Ambulatory Visit: Payer: 59 | Admitting: Nurse Practitioner

## 2021-03-11 ENCOUNTER — Ambulatory Visit (INDEPENDENT_AMBULATORY_CARE_PROVIDER_SITE_OTHER): Payer: BC Managed Care – PPO | Admitting: Psychology

## 2021-03-11 DIAGNOSIS — F418 Other specified anxiety disorders: Secondary | ICD-10-CM | POA: Diagnosis not present
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
## 2021-03-24 ENCOUNTER — Ambulatory Visit (INDEPENDENT_AMBULATORY_CARE_PROVIDER_SITE_OTHER): Payer: BC Managed Care – PPO | Admitting: Psychology

## 2021-03-24 DIAGNOSIS — F418 Other specified anxiety disorders: Secondary | ICD-10-CM | POA: Diagnosis not present

## 2021-03-24 NOTE — Progress Notes (Signed)
[place] Counselor/Therapist Progress Note  Patient ID: Samantha Romero, MRN: [id number]   Date: 03/24/21  Time Spent: 3:05  pm - 3:52  pm : 47 Minutes  Treatment Type: Individual Therapy.  Reported Symptoms: depression and anxiety.  Mental Status Exam: Appearance:  Neat and Well Groomed     Behavior: Appropriate  Motor: Normal  Speech/Language:  Clear and Coherent and Normal Rate  Affect: Appropriate  Mood: normal  Thought process: normal  Thought content:   WNL  Sensory/Perceptual disturbances:   WNL  Orientation: oriented to person, place, time/date, and situation  Attention: Good  Concentration: Good  Memory: WNL  Fund of knowledge:  Good  Insight:   Good  Judgment:  Good  Impulse Control: Good   Risk Assessment: Danger to Self:  No Self-injurious Behavior: No Danger to Others: No Duty to Warn:no Physical Aggression / Violence:No  Access to Firearms a concern: No  Gang Involvement:No   Subjective:   Samantha Romero participated from home, via video and consented to treatment. Therapist participated from office. We met online due to [place] pandemic. We reviewed numerous treatment approaches including CBT, BA, Problem Solving, and Solution focused therapy. Psych-education regarding the Samantha Romero's diagnosis of Other specified anxiety disorders and her reported depressive symptoms, and was provided during the session. We discussed Samantha Romero's goals treatment goals which include developing skills (coping), moving forward (incremental change), decreasing stress, and reducing anxiety. Samantha Romero provided verbal approval of the treatment plan. She discussed barriers to progress, in treatment, including insecurity, self-doubt, and lack of resources. Therapist provided psycho-education regarding negative cognitive distortions, during the session, and discussed the importance of managing this going forward. Samantha Romero has a history of SI. We reviewed safety as a  part of treatment goals:  In case of a mental health emergency:  11 - confidential suicide hotline. [facility]):        [place], [place] [address]       [phone] 3.   911  4.   Visiting Nearest ED.    Interventions:  Goal Setting Session and Psycho-education  Diagnosis:  Other specified anxiety disorders   Treatment Plan:  Client Abilities/Strengths Samantha Romero is intelligent, well spoken, and motivated for change.   Support System: Family and friends.   Client Treatment Preferences OPT  Client Statement of Needs Samantha Romero would like to develop skills move forward (incremental change), decrease stress, and reduce anxiety.    Treatment Level Weekly  Symptoms Depression: Lethargy, low motivation, disturbed sleep, loss of interest, feeling down, fluctuating appetite (low intake w/poor diet), feeling bad about self, difficulty concentrating, psychomotor agitation/retardation, weight-gain  (Status: maintained)  Anxiety: Rapid Heart rate, Sweating, feeling nervous, difficulty managing worry, worrying about different things, difficulty relaxing, restlessness, irritability,  feeling afraid something awful might happen, social anxiety.    (Status: maintained)  Goals:   Samantha Romero experiences symptoms of depression and anxiety.   Target Date: 03/11/22 Frequency: Weekly  Progress: 0 Modality: individual    Therapist will provide referrals for additional resources as appropriate.  Therapist will provide psycho-education regarding Samantha Romero's diagnosis and corresponding treatment approaches and interventions. Licensed Clinical Social Worker, [place], LCSW will support the patient's ability to achieve the goals identified. will employ CBT, BA, Problem-solving, Solution Focused, Mindfulness,  coping skills, & other evidenced-based practices will be used to promote progress towards healthy functioning to help manage decrease  symptoms associated with her diagnosis.   Reduce overall level,  frequency, and intensity of the feelings of depression, anxiety and panic evidenced by decreased overall symptoms from 6 to 7 days/week to 0 to 1 days/week per client report for at least 3 consecutive months. Verbally express understanding of the relationship between feelings of depression, anxiety and their impact on thinking patterns and behaviors. Verbalize an understanding of the role that distorted thinking plays in creating fears, excessive worry, and ruminations.    (Samantha Romero participated in the creation of the treatment plan)   Buena Irish, LCSW

## 2021-04-07 ENCOUNTER — Ambulatory Visit: Payer: BC Managed Care – PPO | Admitting: Psychology

## 2021-04-08 ENCOUNTER — Encounter: Payer: Self-pay | Admitting: Sports Medicine

## 2021-04-09 ENCOUNTER — Other Ambulatory Visit: Payer: Self-pay

## 2021-04-09 DIAGNOSIS — M25552 Pain in left hip: Secondary | ICD-10-CM

## 2021-04-14 ENCOUNTER — Ambulatory Visit: Payer: BC Managed Care – PPO | Admitting: Sports Medicine

## 2021-04-14 ENCOUNTER — Ambulatory Visit
Admission: RE | Admit: 2021-04-14 | Discharge: 2021-04-14 | Disposition: A | Discharge status: Home / Self Care | Payer: BC Managed Care – PPO | Source: Ambulatory Visit | Attending: Sports Medicine | Admitting: Sports Medicine

## 2021-04-14 VITALS — BP 139/83 | Ht 62.0 in | Wt 308.0 lb

## 2021-04-14 DIAGNOSIS — M25552 Pain in left hip: Secondary | ICD-10-CM | POA: Diagnosis not present

## 2021-04-14 MED ORDER — MELOXICAM 15 MG PO TABS: 15.0000 mg | ORAL_TABLET | Freq: Every day | ORAL | 0 refills | Status: DC | PRN

## 2021-04-14 NOTE — Patient Instructions (Signed)
°  I would like for you to start aqua therapy again soon as possible.  You also have access to the videos that physical therapy provided you with.  I am going to prescribe you meloxicam to take daily as needed.  I do not want you taking it every single day and you cannot take any Advil, ibuprofen, or Aleve on the days that you take this medicine.  At your convenience, stop by and get your x-ray done.  I would like to see you again in 6 weeks after you start physical therapy.  But please call me with any questions or concerns that you may have before then.

## 2021-04-15 NOTE — Progress Notes (Addendum)
° °  Subjective:    Patient ID: Samantha Romero, female    DOB: May 19, 1975, 46 y.o.   MRN: [id number]  HPI  Patient presents today with returning left hip pain.  She was last seen in the office in July.  She was referred for physical therapy including aqua therapy and she did notice improvement with limited sessions.  In fact, she thinks her pain may have resolved up until around the holidays.  Her pain has now returned.  She tried to schedule more PT visits but was unable to schedule anything before February.  She continues to localize her pain to the lateral hip.  It is most noticeable when getting up from a seated position.  She does have pain with prolonged standing and walking too.  No pain with sitting.  No significant groin pain.  Pain does not radiate.  She will take an occasional over-the-counter NSAID with limited relief.  We had also recommended an x-ray at her office visit in July but she has yet to get this.    Review of Systems As above    Objective:   Physical Exam  Well-developed, well-nourished.  No acute distress  Left hip: Smooth painless hip range of motion with a negative logroll.  She does have tenderness to palpation diffusely along the posterior lateral hip and a positive Trendelenburg.  Weakness with hip abduction.  Neurovascularly intact distally.  X-rays today of the left hip showed nothing acute.  Mild to moderate degenerative changes are seen at the hip joint.      Assessment & Plan:   Returning left hip pain secondary to pelvic stabilizer weakness  I recommend a return to physical therapy including aqua therapy.  She is amenable to getting a membership at [facility] so that she can start aqua therapy sooner than February.  She also has access to videos which she can do on her own at home.  I've given her a prescription for meloxicam with instructions to take 1 pill daily as needed but she understands that this is not to be used chronically.  She also understands  that she cannot use any other over-the-counter NSAIDs when taking meloxicam.  Follow-up with me again in 6 weeks for reevaluation.  If symptoms fail to improve with repeat physical therapy then consider merits of further diagnostic imaging at that time.  This note was dictated using Dragon naturally speaking software and may contain errors in syntax, spelling, or content which have not been identified prior to signing this note.

## 2021-04-20 ENCOUNTER — Encounter: Payer: BC Managed Care – PPO | Attending: Surgery | Admitting: Skilled Nursing Facility1

## 2021-04-20 ENCOUNTER — Encounter: Payer: Self-pay | Admitting: Skilled Nursing Facility1

## 2021-04-20 ENCOUNTER — Other Ambulatory Visit: Payer: Self-pay

## 2021-04-20 DIAGNOSIS — Z6841 Body Mass Index (BMI) 40.0 and over, adult: Secondary | ICD-10-CM | POA: Insufficient documentation

## 2021-04-20 NOTE — Progress Notes (Signed)
Nutrition Assessment for Bariatric Surgery Medical Nutrition Therapy Appt Start Time: 1:58    End Time: 2:55  Patient was seen on 04/20/2021 for Pre-Operative Nutrition Assessment. Letter of approval faxed to [facility] bariatric surgery program coordinator on 04/20/2021.   Referral stated Supervised Weight Loss (SWL) visits needed: 0  Pt completed visits.   Pt has cleared nutrition requirements.    Planned surgery: Lapband to sleeve Pt expectation of surgery: to lose weight  Pt expectation of dietitian: none identified     NUTRITION ASSESSMENT   Anthropometrics  Start weight at [facility]: 313.4 lbs (date: 04/20/2021)  Height: 62 in BMI: 57.32 kg/m2     Clinical  Medical hx: fibromyalgia, HTN, CHF, gastric banding  Medications: see list  Labs: none in EMR Notable signs/symptoms: tesnion and sinus headaches  Any previous deficiencies? No  Micronutrient Nutrition Focused Physical Exam: Hair: No issues observed Eyes: No issues observed Mouth: No issues observed Neck: No issues observed Nails: No issues observed Skin: No issues observed  Lifestyle & Dietary Hx  Pt states she is currently getting swim lessons.   Pt states she does have an upcoming psychiatrist appt.  Pt states the depression makes it difficult to get up in the morning and crashes after work and has a lot of concern or worry but is excited to work with the psychiatrist.  Pt states she has trouble sleeping. Pt states she recognizes she drinks soda for comfort.  Pt states she recognizes she needs to make changes to her current diet.  24-Hr Dietary Recall First Meal: skipped Snack 10:30-11: apple + clementine  Second Meal: apple or banana Snack 4:30-5: eaten out Third Meal 6-7: [place] ice or tangerine or apple Snack: chips Beverages: soda, water, hot tea   Estimated Energy Needs Calories: 1500   NUTRITION DIAGNOSIS  Overweight/obesity ([place]-3.3) related to past poor dietary habits and  physical inactivity as evidenced by patient w/ planned band to sleeve surgery following dietary guidelines for continued weight loss.    NUTRITION INTERVENTION  Nutrition counseling (C-1) and education (E-2) to facilitate bariatric surgery goals.  Educated pt on micronutrient deficiencies post surgery and strategies to mitigate that risk   Pre-Op Goals Reviewed with the Patient Track food and beverage intake (pen and paper, MyFitness Pal, Baritastic app, etc.) Make healthy food choices while monitoring portion sizes Consume 3 meals per day or try to eat every 3-5 hours Avoid concentrated sugars and fried foods Keep sugar & fat in the single digits per serving on food labels Practice CHEWING your food (aim for applesauce consistency) Practice not drinking 15 minutes before, during, and 30 minutes after each meal and snack Avoid all carbonated beverages (ex: soda, sparkling beverages)  Limit caffeinated beverages (ex: coffee, tea, energy drinks) Avoid all sugar-sweetened beverages (ex: regular soda, sports drinks)  Avoid alcohol  Aim for 64-100 ounces of FLUID daily (with at least half of fluid intake being plain water)  Aim for at least 60-80 grams of PROTEIN daily Look for a liquid protein source that contains ?15 g protein and ?5 g carbohydrate (ex: shakes, drinks, shots) Make a list of non-food related activities Physical activity is an important part of a healthy lifestyle so keep it moving! The goal is to reach 150 minutes of exercise per week, including cardiovascular and weight baring activity. Limit your fruit to 2 servings per day  *Goals that are bolded indicate the pt would like to start working towards these  Handouts Provided Include  Bariatric Surgery  handouts (Nutrition Visits, Pre-Op Goals, Protein Shakes, Vitamins & Minerals)  Learning Style & Readiness for Change Teaching method utilized: Visual & Auditory  Demonstrated degree of understanding via: Teach Back   Readiness Level: pre contemplative Barriers to learning/adherence to lifestyle change: unknown      MONITORING & EVALUATION Dietary intake, weekly physical activity, body weight, and pre-op goals reached at next nutrition visit.    Next Steps  Patient is to follow up at [facility] for Pre-Op Class >2 weeks before surgery for further nutrition education.  Pt has completed visits. No further supervised visits required/recomended

## 2021-04-21 ENCOUNTER — Ambulatory Visit (INDEPENDENT_AMBULATORY_CARE_PROVIDER_SITE_OTHER): Payer: BC Managed Care – PPO | Admitting: Psychology

## 2021-04-21 DIAGNOSIS — F418 Other specified anxiety disorders: Secondary | ICD-10-CM | POA: Diagnosis not present

## 2021-04-21 NOTE — Progress Notes (Signed)
[facility] Counselor/Therapist Progress Note ° °Patient ID: Samantha Romero, MRN: [id number]  ° °Date: 04/21/21 ° °Time Spent: 5:09  pm - 5:54  pm : 45 Minutes ° °Treatment Type: Individual Therapy. ° °Reported Symptoms: depression and anxiety. ° °Mental Status Exam: °Appearance:  Neat and Well Groomed     °Behavior: Appropriate  °Motor: Normal  °Speech/Language:  Clear and Coherent and Normal Rate  °Affect: Appropriate  °Mood: normal  °Thought process: normal  °Thought content:   WNL  °Sensory/Perceptual disturbances:   WNL  °Orientation: oriented to person, place, time/date, and situation  °Attention: Good  °Concentration: Good  °Memory: WNL  °Fund of knowledge:  Good  °Insight:   Good  °Judgment:  Good  °Impulse Control: Good  ° °Risk Assessment: °Danger to Self:  No °Self-injurious Behavior: No °Danger to Others: No °Duty to Warn:no °Physical Aggression / Violence:No  °Access to Firearms a concern: No  °Gang Involvement:No  ° °Subjective:  ° °Samantha Romero participated from home, via video and consented to treatment. Therapist participated from office. We met online due to COVID pandemic. Samantha Romero noted achieving her goal of being more social but noted this being successful due to not being responsible for coordinating and having a general level of excitement for the outing. Additional barriers being a people pleaser, lethargy, & difficulty managing her day-to-day pain. She noted a lack of overall professional satisfaction contributing to her overall mood. Therapist encouraged Samantha Romero set goals between sessions to manage these barriers going forward. Samantha Romero requested information regarding psychiatric providers in the area. Therapist praised Samantha Romero for her effort and energy and provided supportive therapy.  ° ° °Interventions:  Behavioral Activation ° °Diagnosis:  °Other specified anxiety disorders ° ° °Treatment Plan: ° °Client Abilities/Strengths °Samantha Romero is intelligent, well spoken, and motivated  for change.  ° °Support System: °Family and friends.  ° °Client Treatment Preferences °OPT ° °Client Statement of Needs °Samantha Romero would like to develop skills move forward (incremental change), decrease stress, and reduce anxiety.   ° °Treatment Level °Weekly ° °Symptoms °Depression: Lethargy, low motivation, disturbed sleep, loss of interest, feeling down, fluctuating appetite (low intake w/poor diet), feeling bad about self, difficulty concentrating, psychomotor agitation/retardation, weight-gain  (Status: maintained) ° °Anxiety: Rapid Heart rate, Sweating, feeling nervous, difficulty managing worry, worrying about different things, difficulty relaxing, restlessness, irritability,  feeling afraid something awful might happen, social anxiety.    (Status: maintained) ° °Goals:  ° °[name] experiences symptoms of depression and anxiety. ° ° °Target Date: 03/11/22 Frequency: Weekly  °Progress: 0 Modality: individual  ° ° °Therapist will provide referrals for additional resources as appropriate.  °Therapist will provide psycho-education regarding Samantha Romero's diagnosis and corresponding treatment approaches and interventions. °Licensed Clinical Social Worker, Osman Mostafa, LCSW will support the patient's ability to achieve the goals identified. will employ CBT, BA, Problem-solving, Solution Focused, Mindfulness,  coping skills, & other evidenced-based practices will be used to promote progress towards healthy functioning to help manage decrease symptoms associated with her diagnosis.  ° Reduce overall level, frequency, and intensity of the feelings of depression and anxiety evidenced by decreased overall symptoms of depression and anxiety, from 6 to 7 days/week to 0 to 1 days/week per client report for at least 3 consecutive months. °Verbally express understanding of the relationship between feelings of depression, anxiety and their impact on thinking patterns and behaviors. °Verbalize an understanding of the role that  distorted thinking plays in creating fears, excessive worry, and ruminations. ° ° ° °(Samantha Romero participated in   the creation of the treatment plan)   Buena Irish, LCSW            Buena Irish, LCSW

## 2021-05-02 ENCOUNTER — Other Ambulatory Visit: Payer: Self-pay | Admitting: Nurse Practitioner

## 2021-05-02 DIAGNOSIS — F418 Other specified anxiety disorders: Secondary | ICD-10-CM

## 2021-05-06 ENCOUNTER — Other Ambulatory Visit: Payer: Self-pay | Admitting: Nurse Practitioner

## 2021-05-06 DIAGNOSIS — F418 Other specified anxiety disorders: Secondary | ICD-10-CM

## 2021-05-11 ENCOUNTER — Other Ambulatory Visit: Payer: Self-pay | Admitting: Surgery

## 2021-05-11 ENCOUNTER — Other Ambulatory Visit (HOSPITAL_COMMUNITY): Payer: Self-pay | Admitting: Surgery

## 2021-05-11 DIAGNOSIS — K909 Intestinal malabsorption, unspecified: Secondary | ICD-10-CM

## 2021-05-13 ENCOUNTER — Ambulatory Visit (INDEPENDENT_AMBULATORY_CARE_PROVIDER_SITE_OTHER): Payer: BC Managed Care – PPO | Admitting: Psychology

## 2021-05-13 ENCOUNTER — Other Ambulatory Visit: Payer: Self-pay | Admitting: Sports Medicine

## 2021-05-13 DIAGNOSIS — F418 Other specified anxiety disorders: Secondary | ICD-10-CM

## 2021-05-13 NOTE — Progress Notes (Signed)
[facility] Counselor/Therapist Progress Note  Patient ID: COLLYNS MCQUIGG, MRN: [id number]   Date: 05/13/21  Time Spent: 10:07  am - 10:56  am : 49 Minutes  Treatment Type: Individual Therapy.  Reported Symptoms: depression and anxiety.  Mental Status Exam: Appearance:  Neat and Well Groomed     Behavior: Appropriate  Motor: Normal  Speech/Language:  Clear and Coherent and Normal Rate  Affect: Appropriate  Mood: normal  Thought process: normal  Thought content:   WNL  Sensory/Perceptual disturbances:   WNL  Orientation: oriented to person, place, time/date, and situation  Attention: Good  Concentration: Good  Memory: WNL  Fund of knowledge:  Good  Insight:   Good  Judgment:  Good  Impulse Control: Good   Risk Assessment: Danger to Self:  No Self-injurious Behavior: No Danger to Others: No Duty to Warn:no Physical Aggression / Violence:No  Access to Firearms a concern: No  Gang Involvement:No   Subjective:   [name] participated from home, via video and consented to treatment. Therapist participated from home office. We met online due to [place] pandemic. Sabeen noted positive behavior engagement, specifically getting up and getting ready, leaving the home, and working from outside at a coffee shop while sitting in direct sunlight. Therapist praised Aira for her diligent effort and energy in this area. Eleanora noted professional stressors and rumination regarding a project assignment that she believes unachievable. We worked on identifying boundaries for rumination by focusing on productive tasks, managing negative self-talk, & focus on areas of progress. We employed BA principles to identify enjoyable behaviors, set a schedule, created reasonable expectations, identifying barriers, and preparation. Therapist praised Shenica for her effort and energy and provided supportive therapy.    Interventions:  Behavioral Activation  Diagnosis:  Other specified  anxiety disorders   Treatment Plan:  Client Abilities/Strengths Sianni is intelligent, well spoken, and motivated for change.   Support System: Family and friends.   Client Treatment Preferences OPT  Client Statement of Needs Jourdan would like to develop skills move forward (incremental change), decrease stress, and reduce anxiety.    Treatment Level Weekly  Symptoms Depression: Lethargy, low motivation, disturbed sleep, loss of interest, feeling down, fluctuating appetite (low intake w/poor diet), feeling bad about self, difficulty concentrating, psychomotor agitation/retardation, weight-gain  (Status: maintained)  Anxiety: Rapid Heart rate, Sweating, feeling nervous, difficulty managing worry, worrying about different things, difficulty relaxing, restlessness, irritability,  feeling afraid something awful might happen, social anxiety.    (Status: maintained)  Goals:   Kalayna experiences symptoms of depression and anxiety.   Target Date: 03/11/22 Frequency: Weekly  Progress: 0 Modality: individual    Therapist will provide referrals for additional resources as appropriate.  Therapist will provide psycho-education regarding Mishal's diagnosis and corresponding treatment approaches and interventions. Licensed Clinical Social Worker, Lino Lakes, LCSW will support the patient's ability to achieve the goals identified. will employ CBT, BA, Problem-solving, Solution Focused, Mindfulness,  coping skills, & other evidenced-based practices will be used to promote progress towards healthy functioning to help manage decrease symptoms associated with her diagnosis.   Reduce overall level, frequency, and intensity of the feelings of depression and anxiety evidenced by decreased overall symptoms of depression and anxiety, from 6 to 7 days/week to 0 to 1 days/week per client report for at least 3 consecutive months. Verbally express understanding of the relationship between feelings of  depression, anxiety and their impact on thinking patterns and behaviors. Verbalize an understanding of the role that distorted thinking  plays in creating fears, excessive worry, and ruminations.    (Samary participated in the creation of the treatment plan)   Buena Irish, LCSW

## 2021-05-14 ENCOUNTER — Other Ambulatory Visit: Payer: Self-pay | Admitting: Nurse Practitioner

## 2021-05-17 ENCOUNTER — Other Ambulatory Visit: Payer: Self-pay | Admitting: Nurse Practitioner

## 2021-05-17 DIAGNOSIS — G4709 Other insomnia: Secondary | ICD-10-CM

## 2021-05-17 NOTE — Therapy (Incomplete)
OUTPATIENT PHYSICAL THERAPY LOWER EXTREMITY EVALUATION   Patient Name: Samantha Romero MRN: [id number] DOB:[birth year], 46 y.o., female Today's Date: 05/17/2021    Past Medical History:  Diagnosis Date   Anxiety    panic attacks   Asthma    CHF (congestive heart failure), NYHA class II, acute, systolic ([place]) [id number]   Depression    Fibromyalgia    Hypertension    no meds currently   No pertinent past medical history    Pneumonia    hx of    Sinus problem    Past Surgical History:  Procedure Laterality Date   ESOPHAGOGASTRODUODENOSCOPY N/A 11/26/2014   Procedure: ESOPHAGOGASTRODUODENOSCOPY (EGD);  Surgeon: Alphonsa Overall, MD;  Location: [facility];  Service: General;  Laterality: N/A;   GASTRIC BANDING PORT REVISION  08/24/11   LAPAROSCOPIC GASTRIC BANDING  12/26/2007   LAPAROSCOPIC LYSIS OF ADHESIONS N/A 01/06/2015   Procedure: LAPAROSCOPIC LYSIS OF ADHESIONS;  Surgeon: Johnathan Hausen, MD;  Location: [facility];  Service: General;  Laterality: N/A;   LAPAROSCOPIC REPAIR AND REMOVAL OF GASTRIC BAND N/A 01/06/2015   Procedure: LAPAROSCOPIC RESIGHTING OF GASTRIC Band PORT;  Surgeon: Johnathan Hausen, MD;  Location: [facility];  Service: General;  Laterality: N/A;   NO PAST SURGERIES     Patient Active Problem List   Diagnosis Date Noted   Left hip pain 09/30/2020   Environmental and seasonal allergies 07/24/2019   Asthma 10/06/2018   Hypertensive urgency 10/06/2018   GERD (gastroesophageal reflux disease) 10/06/2018   Depression with anxiety 10/06/2018   CHF (congestive heart failure), NYHA class II, acute, systolic ([name]) [id number]   Shortness of breath    Cardiomegaly    Snoring 10/04/2018   Essential hypertension 08/14/2018   Moderate persistent asthma with acute exacerbation 08/14/2018   Fibromyalgia 11/18/2016   Class 3 severe obesity due to excess calories without serious comorbidity with body mass index (BMI) of 45.0 to 49.9 in adult ([place]) 11/18/2016   Adjustment reaction  with anxiety and depression 11/18/2016   Chronic bilateral low back pain without sciatica 09/22/2015   Myofascial pain 09/22/2015   Lapband port leaking-REVISED May 2013 09/10/2011   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  ONSET DATE: ***  SUBJECTIVE:   SUBJECTIVE STATEMENT: Pt reports no specific MOI, but insidious onset.  Pt states she has been having pain in L hip since 2014.  She received PT and a cortisone shot but both did not improve sx's.  She was also having pain in other areas inclduing back, neck, and shoulders.  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  Pt reports improved pain in other areas as she was being treated by fibromyalgia, but her L hip continued to hurt.  Pt states her pain worsened in November when she changed jobs and became more sedentary. Pt was seen in PT for 4 visits for L hip pain from 7/9 - 11/04/20.  She was making progress with function and received land based and aquatic based PT.  PERTINENT HISTORY: CHF(starting cardiac rehab in August), Morbid Obesity, fibromyalgia,  Hx of lap band and revision surgery   PAIN:  Are you having pain? {yes/no:20286} NPRS scale: ***/10 Pain location: *** Pain orientation: {Pain Orientation:25161}  PAIN TYPE: {type:313116} Pain description: {PAIN DESCRIPTION:21022940}  Aggravating factors: *** Relieving factors: ***  PRECAUTIONS: {Therapy precautions:24002}  WEIGHT BEARING RESTRICTIONS {Yes ***/No:24003}  FALLS:  Has patient fallen in last 6 months? {yes/no:20286}, Number of falls: ***  LIVING ENVIRONMENT: Lives with: {[facility] lives with:25569::"lives with their family"} Lives in: {Lives in:25570} Stairs: {yes/no:20286}; {Stairs:24000} Has following equipment at home: {Assistive devices:23999}  OCCUPATION: ***  PLOF:  {PLOF:24004}  PATIENT GOALS ***   OBJECTIVE:   DIAGNOSTIC FINDINGS: Pelvis x ray:  IMPRESSION: No acute abnormality noted.  PATIENT SURVEYS:  {rehab surveys:24030}  COGNITION:  Overall cognitive status: Within functional limits for tasks assessed     SENSATION:  Light touch: {intact/deficits:24005}  Stereognosis: {intact/deficits:24005}  Hot/Cold: {intact/deficits:24005}  Proprioception: {intact/deficits:24005}  MUSCLE LENGTH: Hamstrings: Right *** deg; Left *** deg Thomas test: Right *** deg; Left *** deg  POSTURE:  ***  PALPATION: ***  LE AROM/PROM:  A/PROM Right 05/17/2021 Left 05/17/2021  Hip flexion    Hip extension    Hip abduction    Hip adduction    Hip internal rotation    Hip external rotation    Knee flexion    Knee extension    Ankle dorsiflexion    Ankle plantarflexion    Ankle inversion    Ankle eversion     (Blank rows = not tested)  LE MMT:  MMT Right 05/17/2021 Left 05/17/2021  Hip flexion    Hip extension    Hip abduction    Hip adduction    Hip internal rotation    Hip external rotation    Knee flexion    Knee extension    Ankle dorsiflexion    Ankle plantarflexion    Ankle inversion    Ankle eversion     (Blank rows = not tested)  LOWER EXTREMITY SPECIAL TESTS:  {LEspecialtests:26242}  FUNCTIONAL TESTS:  {Functional tests:24029}  GAIT: Distance walked: *** Assistive device utilized: {Assistive devices:23999} Level of assistance: {Levels of assistance:24026} Comments: ***    TODAY'S TREATMENT: ***   PATIENT EDUCATION:  Education details: *** Person educated: {Person educated:25204} Education method: {Education Method:25205} Education comprehension: {Education Comprehension:25206}   HOME EXERCISE PROGRAM: Prior HEP Exercises Supine Gluteus Stretch - 2 x daily - 7 x weekly - 1 sets - 2-3 reps - 20-30 seconds hold Supine Posterior Pelvic Tilt - 2 x daily - 7 x weekly - 2 sets - 10 reps Bent Knee Fallouts - 1 x  daily - 7 x weekly - 3 sets - 10 reps Supine March - 2 x daily - 7 x weekly - 2 sets - 10 reps. Pt also received a handout for TrA contraction.   ASSESSMENT:  CLINICAL IMPRESSION: Patient is a *** y.o. *** who was seen today for physical therapy evaluation and treatment for ***.    OBJECTIVE IMPAIRMENTS {opptimpairments:25111}.   ACTIVITY LIMITATIONS {activity limitations:25113}.   PERSONAL FACTORS {Personal factors:25162} are also affecting patient's functional outcome.    REHAB POTENTIAL: {rehabpotential:25112}  CLINICAL DECISION MAKING: {clinical decision making:25114}  EVALUATION COMPLEXITY: {Evaluation complexity:25115}   GOALS: Goals reviewed with patient? {yes/no:20286}  SHORT TERM GOALS:  STG Name Target Date Goal status  1 *** Baseline:  {follow up:25551} {GOALSTATUS:25110}  2 *** Baseline:  {follow up:25551} {GOALSTATUS:25110}  3 *** Baseline: {follow up:25551} {GOALSTATUS:25110}  4 *** Baseline: {follow up:25551} {GOALSTATUS:25110}  5 *** Baseline: {follow up:25551} {GOALSTATUS:25110}  6 *** Baseline: {follow up:25551} {GOALSTATUS:25110}  7 *** Baseline: {follow up:25551} {GOALSTATUS:25110}   LONG TERM GOALS:   LTG Name Target Date Goal status  1 *** Baseline: {follow up:25551} {GOALSTATUS:25110}  2 *** Baseline: {follow  up:25551} {GOALSTATUS:25110}  3 *** Baseline: {follow up:25551} {GOALSTATUS:25110}  4 *** Baseline: {follow up:25551} {GOALSTATUS:25110}  5 *** Baseline: {follow up:25551} {GOALSTATUS:25110}  6 *** Baseline: {follow up:25551} {GOALSTATUS:25110}  7 *** Baseline: {follow up:25551} {GOALSTATUS:25110}   PLAN: PT FREQUENCY: {rehab frequency:25116}  PT DURATION: {rehab duration:25117}  PLANNED INTERVENTIONS: {rehab planned interventions:25118::"Therapeutic exercises","Therapeutic activity","Neuro Muscular re-education","Balance training","Gait training","Patient/Family education","Joint mobilization"}  PLAN FOR NEXT SESSION:  Ronny Flurry, PT 05/17/2021, 8:48 AM

## 2021-05-18 ENCOUNTER — Ambulatory Visit (HOSPITAL_BASED_OUTPATIENT_CLINIC_OR_DEPARTMENT_OTHER): Payer: BC Managed Care – PPO | Admitting: Physical Therapy

## 2021-05-18 NOTE — Telephone Encounter (Signed)
Approve flucticasone

## 2021-05-21 ENCOUNTER — Ambulatory Visit (HOSPITAL_BASED_OUTPATIENT_CLINIC_OR_DEPARTMENT_OTHER): Payer: BC Managed Care – PPO | Attending: Sports Medicine | Admitting: Physical Therapy

## 2021-05-21 ENCOUNTER — Encounter (HOSPITAL_BASED_OUTPATIENT_CLINIC_OR_DEPARTMENT_OTHER): Payer: Self-pay | Admitting: Physical Therapy

## 2021-05-21 ENCOUNTER — Other Ambulatory Visit: Payer: Self-pay

## 2021-05-21 DIAGNOSIS — M6281 Muscle weakness (generalized): Secondary | ICD-10-CM | POA: Insufficient documentation

## 2021-05-21 DIAGNOSIS — M25552 Pain in left hip: Secondary | ICD-10-CM | POA: Diagnosis not present

## 2021-05-21 NOTE — Therapy (Signed)
OUTPATIENT PHYSICAL THERAPY LOWER EXTREMITY EVALUATION   Patient Name: Samantha Romero MRN: [id number] DOB:Apr 06, 1975, 46 y.o., female Today's Date: 05/21/2021   PT End of Session - 05/21/21 1047     Visit Number 1    Number of Visits 12    Date for PT Re-Evaluation 07/02/21    PT Start Time 0949    PT Stop Time 1025    PT Time Calculation (min) 36 min    Activity Tolerance Patient tolerated treatment well    Behavior During Therapy [facility] for tasks assessed/performed             Past Medical History:  Diagnosis Date   Anxiety    panic attacks   Asthma    CHF (congestive heart failure), NYHA class II, acute, systolic (HCC) 10/06/2018   Depression    Fibromyalgia    Hypertension    no meds currently   No pertinent past medical history    Pneumonia    hx of    Sinus problem    Past Surgical History:  Procedure Laterality Date   ESOPHAGOGASTRODUODENOSCOPY N/A 11/26/2014   Procedure: ESOPHAGOGASTRODUODENOSCOPY (EGD);  Surgeon: Ovidio Kin, MD;  Location: [facility];  Service: General;  Laterality: N/A;   GASTRIC BANDING PORT REVISION  08/24/11   LAPAROSCOPIC GASTRIC BANDING  12/26/2007   LAPAROSCOPIC LYSIS OF ADHESIONS N/A 01/06/2015   Procedure: LAPAROSCOPIC LYSIS OF ADHESIONS;  Surgeon: Luretha Murphy, MD;  Location: [facility];  Service: General;  Laterality: N/A;   LAPAROSCOPIC REPAIR AND REMOVAL OF GASTRIC BAND N/A 01/06/2015   Procedure: LAPAROSCOPIC RESIGHTING OF GASTRIC Band PORT;  Surgeon: Luretha Murphy, MD;  Location: [facility];  Service: General;  Laterality: N/A;   NO PAST SURGERIES     Patient Active Problem List   Diagnosis Date Noted   Left hip pain 09/30/2020   Environmental and seasonal allergies 07/24/2019   Asthma 10/06/2018   Hypertensive urgency 10/06/2018   GERD (gastroesophageal reflux disease) 10/06/2018   Depression with anxiety 10/06/2018   CHF (congestive heart failure), NYHA class II, acute, systolic (HCC) 10/06/2018   Shortness of breath     Cardiomegaly    Snoring 10/04/2018   Essential hypertension 08/14/2018   Moderate persistent asthma with acute exacerbation 08/14/2018   Fibromyalgia 11/18/2016   Class 3 severe obesity due to excess calories without serious comorbidity with body mass index (BMI) of 45.0 to 49.9 in adult (HCC) 11/18/2016   Adjustment reaction with anxiety and depression 11/18/2016   Chronic bilateral low back pain without sciatica 09/22/2015   Myofascial pain 09/22/2015   Lapband port leaking-REVISED May 2013 09/10/2011   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  -Pt reports no specific MOI, but insidious onset.  Pt states she has been having pain in L hip since 2014.  Pt's pain worsened in November 2021 when she changed jobs and became more sedentary.  Pt was seen in PT for L hip in July and August of last year.  Pt states she was improving in PT.  She was exposed to Covid though didn't test positive.  Pt quarantined and didn't return to PT.   She was trying not to be around a lot of  people in order to not be exposed with her co-morbidities.  She reports her pain worsening around Christmas to beginning of January.    -Pt returned to MD and had an x ray.  Pt was informed by MD she had mild arthritis in hip though doesn't think it's significant enough to be her main source of pain.  He ordered her to return to PT including aquatic therapy.  Pt hasn't taken meloxicam due to some conflicting with her medication.  Pt states she is ready to resume aquatic therapy and thinks that was really helping her.   Pt reports she has had improved aching and mobility since early February.  Pt states she can't contract her L  glute well.  It's different compared to her R.  Worst pain with extended ambulation and performing household chores including standing to cook and wash dishes.    PERTINENT HISTORY: CHF, morbid obesity, and fibromyalgia   PAIN:  Are you having pain? No NPRS scale: 0/10 current, 9/10 worst pain Pain location: lateral and post L hip; glute  Aggravating factors: lying on L side, standing to perform household chores, extended ambulation Relieving factors: massager with heat, lying on R side  PRECAUTIONS: Other: CHF  WEIGHT BEARING RESTRICTIONS No  FALLS:  Has patient fallen in last 6 months? Yes, Number of falls: 1   PLOF: Independent  PATIENT GOALS improve pain, stamina, flexibility   OBJECTIVE:   DIAGNOSTIC FINDINGS:  x rays of L hip:   IMPRESSION: No acute abnormality noted. MD note indicated X-rays today of the left hip showed nothing acute.  Mild to moderate degenerative changes are seen at the hip joint.   PATIENT SURVEYS:  FOTO 33 with a goal of 57 at visit #13  COGNITION:  Overall cognitive status: Within functional limits for tasks assessed       MUSCLE LENGTH: Pt has tightness in L piriformis   PALPATION: TTP:  lateral L hip and minimally in superior glute   LE AROM/PROM:  A/PROM Right 05/21/2021 Left 05/21/2021  Hip flexion    Hip extension [facility]  Hip abduction WNL WNL  Hip adduction    Hip internal rotation Limited but symmetrical Limited but symmetrical  Hip external rotation [facility]  Knee flexion    Knee extension    Ankle dorsiflexion    Ankle plantarflexion    Ankle inversion    Ankle eversion     (Blank rows = not tested)  LE MMT:  MMT Right 05/21/2021 Left 05/21/2021  Hip flexion 5/5 5/5  Hip extension 4+/5 4/5  Hip abduction 5/5 4/5  Hip adduction    Hip internal rotation 4+/5 4+/5  Hip external rotation 5/5 5/5  Knee flexion 5/5 5/5  Knee extension 5/5 4+/5  Ankle dorsiflexion    Ankle plantarflexion    Ankle inversion     Ankle eversion     (Blank rows = not tested)  LOWER EXTREMITY SPECIAL TESTS:  Ober's Test negative bilat    GAIT: Assistive device utilized: None Level of assistance: Complete Independence Comments: Pt ambulates with a heel to toe gait pattern without limping.    TODAY'S TREATMENT: Reviewed prior HEP and educated pt in correct form.   See below for pt education.   PATIENT EDUCATION:  Education details: HEP, POC, rationale of exercises, dx, prognosis, and objective findings. Person educated: Patient Education method: Explanation and visual media Education comprehension: verbalized understanding   HOME EXERCISE PROGRAM: Prior Medbridge Access Code:  [email or web address]  ASSESSMENT:  CLINICAL IMPRESSION: Patient is a 46 y.o. female with a dx of Left hip pain presenting to the clinic with L hip pain and L LE weakness in hip and minimally in knee extension.  Pt responded well to prior PT but was unable to finish PT.  She reports her pain and sx's have improved this past month.  Pt is limited with ambulation distance having increased pain with extended community ambulation.  She also has increased pain with standing activities including household chores.  Pt should benefit from skilled PT including aquatic therapy to address impairments and goals and improve overall function.       OBJECTIVE IMPAIRMENTS decreased activity tolerance, decreased endurance, decreased mobility, difficulty walking, decreased strength, hypomobility, impaired flexibility, and pain.   ACTIVITY LIMITATIONS cleaning, community activity, meal prep, and washing dishes and extended ambulation .   PERSONAL FACTORS 3+ comorbidities: CHF, morbid obesity, and fibromyalgia   are also affecting patient's functional outcome.    REHAB POTENTIAL: Good  CLINICAL DECISION MAKING: Stable/uncomplicated  EVALUATION COMPLEXITY: Low   GOALS:   SHORT TERM GOALS:  STG Name Target Date Goal status  1 Pt will be independent  and compliant with HEP for improved pain, strength, and function. Baseline:  06/11/2021 INITIAL  2 Pt will report worst pain to be <7/10 for improved extended ambulation and standing tolerance.  Baseline:  06/11/2021 INITIAL  3 Pt will report at least a 25% improvement in performance of household chores.  Baseline: 06/11/2021 INITIAL                       LONG TERM GOALS:   LTG Name Target Date Goal status  1 Pt will be able to stand to cook and wash dishes without significant pain or difficulty.   Baseline: 07/02/2021 INITIAL  2 Pt will demo improved L hip abd and ext strength and L knee ext strength to 5/5 MMT for improved tolerance to functional mobility skills and household chores.  Baseline: 07/02/2021 INITIAL  3 Pt will report she is able to ambulate extended community distance without significant pain or difficulty  Baseline: 07/02/2021 INITIAL                       PLAN: PT FREQUENCY: 2x/week  PT DURATION: 6 weeks  PLANNED INTERVENTIONS: Therapeutic exercises, Therapeutic activity, Neuro Muscular re-education, Gait training, Patient/Family education, Joint mobilization, Stair training, Aquatic Therapy, Dry Needling, Electrical stimulation, Spinal mobilization, Cryotherapy, Moist heat, Taping, Ultrasound, and Manual therapy  PLAN FOR NEXT SESSION: Aquatic therapy for the next 3-4 appointments and then return to land.  Progress HEP if tolerable and STM to glute.     Audie Clear III PT, DPT 05/21/21 11:37 PM

## 2021-05-26 ENCOUNTER — Ambulatory Visit: Payer: BC Managed Care – PPO | Admitting: Psychology

## 2021-05-26 DIAGNOSIS — F418 Other specified anxiety disorders: Secondary | ICD-10-CM

## 2021-05-26 NOTE — Progress Notes (Signed)
[place] Counselor/Therapist Progress Note  Patient ID: Samantha Romero, MRN: [id number]   Date: 05/26/21  Time Spent: 3:08  pm - 4:00 am : 52 Minutes  Treatment Type: Individual Therapy.  Reported Symptoms: depression and anxiety.  Mental Status Exam: Appearance:  Neat and Well Groomed     Behavior: Appropriate  Motor: Normal  Speech/Language:  Clear and Coherent and Normal Rate  Affect: Appropriate  Mood: normal  Thought process: normal  Thought content:   WNL  Sensory/Perceptual disturbances:   WNL  Orientation: oriented to person, place, time/date, and situation  Attention: Good  Concentration: Good  Memory: WNL  Fund of knowledge:  Good  Insight:   Good  Judgment:  Good  Impulse Control: Good   Risk Assessment: Danger to Self:  No Self-injurious Behavior: No Danger to Others: No Duty to Warn:no Physical Aggression / Violence:No  Access to Firearms a concern: No  Gang Involvement:No   Subjective:   [name] participated from home, via video and consented to treatment. Therapist participated from office. We met online due to [place] pandemic. Amilah noted her recent consideration of removing her lap band, which is currently not filled, and her possible interest in bariatric surgery. Ginny noted last week being lethargic, having poor sleep, and having no appetite. She noted panic symptoms and having difficulty managing this. She attempted to manage this via spending time outside, stretching, dancing, went to pool, and leave the home more often. She noted her scheduled appointment with a new medical provider, tomorrow, to establish care. She noted interpersonal stressors, at work, with a Arboriculturist and the effect of this on her mood. We explored this during the session. Ninoshka noted feelings of frustration. We worked on processing her stressors during the session. Therapist encouraged Kahlyn to set boundaries for self regarding rumination.  Therapist praised Liyah for her effort and energy and provided supportive therapy.    Interventions: Interpersonal  Diagnosis:  Other specified anxiety disorders  Treatment Plan:  Client Abilities/Strengths Jay is intelligent, well spoken, and motivated for change.   Support System: Family and friends.   Client Treatment Preferences OPT  Client Statement of Needs [place] would like to develop skills move forward (incremental change), decrease stress, and reduce anxiety.    Treatment Level Weekly  Symptoms Depression: Lethargy, low motivation, disturbed sleep, loss of interest, feeling down, fluctuating appetite (low intake w/poor diet), feeling bad about self, difficulty concentrating, psychomotor agitation/retardation, weight-gain  (Status: maintained)  Anxiety: Rapid Heart rate, Sweating, feeling nervous, difficulty managing worry, worrying about different things, difficulty relaxing, restlessness, irritability,  feeling afraid something awful might happen, social anxiety.    (Status: maintained)  Goals:   [name] experiences symptoms of depression and anxiety.   Target Date: 03/11/22 Frequency: Weekly  Progress: 0 Modality: individual    Therapist will provide referrals for additional resources as appropriate.  Therapist will provide psycho-education regarding Fia's diagnosis and corresponding treatment approaches and interventions. Licensed Clinical Social Worker, [place], LCSW will support the patient's ability to achieve the goals identified. will employ CBT, BA, Problem-solving, Solution Focused, Mindfulness,  coping skills, & other evidenced-based practices will be used to promote progress towards healthy functioning to help manage decrease symptoms associated with her diagnosis.   Reduce overall level, frequency, and intensity of the feelings of depression and anxiety evidenced by decreased overall symptoms of depression and anxiety, from 6 to 7 days/week  to 0 to 1 days/week per client report for at least 3 consecutive  months. Verbally express understanding of the relationship between feelings of depression, anxiety and their impact on thinking patterns and behaviors. Verbalize an understanding of the role that distorted thinking plays in creating fears, excessive worry, and ruminations.    (Braylinn participated in the creation of the treatment plan)   Buena Irish, LCSW

## 2021-06-09 ENCOUNTER — Ambulatory Visit (INDEPENDENT_AMBULATORY_CARE_PROVIDER_SITE_OTHER): Payer: BC Managed Care – PPO | Admitting: Psychology

## 2021-06-09 DIAGNOSIS — F418 Other specified anxiety disorders: Secondary | ICD-10-CM

## 2021-06-09 NOTE — Progress Notes (Signed)
[place] Counselor/Therapist Progress Note ? ?Patient ID: Samantha Romero, MRN: [id number]  ? ?Date: 06/09/21 ? ?Time Spent: 4:06  pm - 4:57 pm : 51 Minutes ? ?Treatment Type: Individual Therapy. ? ?Reported Symptoms: depression and anxiety. ? ?Mental Status Exam: ?Appearance:  Neat and Well Groomed     ?Behavior: Appropriate  ?Motor: Normal  ?Speech/Language:  Clear and Coherent and Normal Rate  ?Affect: Appropriate  ?Mood: normal  ?Thought process: normal  ?Thought content:   WNL  ?Sensory/Perceptual disturbances:   WNL  ?Orientation: oriented to person, place, time/date, and situation  ?Attention: Good  ?Concentration: Good  ?Memory: WNL  ?Fund of knowledge:  Good  ?Insight:   Good  ?Judgment:  Good  ?Impulse Control: Good  ? ?Risk Assessment: ?Danger to Self:  No ?Self-injurious Behavior: No ?Danger to Others: No ?Duty to Warn:no ?Physical Aggression / Violence:No  ?Access to Firearms a concern: No  ?Gang Involvement:No  ? ?Subjective:  ? ?Samantha Romero participated from home, via video and consented to treatment. Therapist participated from office. We met online due to [place] pandemic. She provided an update regarding the past week. She noted her attempts to manage her anxiety via a grounding exercise, and found it to be helpful. She noted feeling over stimulated and her attempts to manage this. She noted this being a long-standing issue including "too much noise, too many people, and too many voices". She endorsed additional symptoms of ADHD including inattention and being easily distracted and would benefit from an ADHD screening. Samantha Romero was provided with the ASRS v1.1, via email, to be completed between sessions. Therapist praised Samantha Romero for her effort and energy and provided supportive therapy.  ? ? ?Interventions: Psycho-education/Bibliotherapy ? ?Diagnosis:  ?No diagnosis found. ? ?Treatment Plan: ? ?Client Abilities/Strengths ?Samantha Romero is intelligent, well spoken, and motivated for change.   ? ?Support System: ?Family and friends.  ? ?Client Treatment Preferences ?OPT ? ?Client Statement of Needs ?Samantha Romero would like to develop skills move forward (incremental change), decrease stress, and reduce anxiety.   ? ?Treatment Level ?Weekly ? ?Symptoms ?Depression: Lethargy, low motivation, disturbed sleep, loss of interest, feeling down, fluctuating appetite (low intake w/poor diet), feeling bad about self, difficulty concentrating, psychomotor agitation/retardation, weight-gain  (Status: maintained) ? ?Anxiety: Rapid Heart rate, Sweating, feeling nervous, difficulty managing worry, worrying about different things, difficulty relaxing, restlessness, irritability,  feeling afraid something awful might happen, social anxiety.    (Status: maintained) ? ?Goals:  ? ?Samantha Romero experiences symptoms of depression and anxiety. ? ? ?Target Date: 03/11/22 Frequency: Weekly  ?Progress: 0 Modality: individual  ? ? ?Therapist will provide referrals for additional resources as appropriate.  ?Therapist will provide psycho-education regarding Samantha Romero's diagnosis and corresponding treatment approaches and interventions. ?Licensed Clinical Social Worker, Buena Irish, LCSW will support the patient's ability to achieve the goals identified. will employ CBT, BA, Problem-solving, Solution Focused, Mindfulness,  coping skills, & other evidenced-based practices will be used to promote progress towards healthy functioning to help manage decrease symptoms associated with her diagnosis.  ? Reduce overall level, frequency, and intensity of the feelings of depression and anxiety evidenced by decreased overall symptoms of depression and anxiety, from 6 to 7 days/week to 0 to 1 days/week per client report for at least 3 consecutive months. ?Verbally express understanding of the relationship between feelings of depression, anxiety and their impact on thinking patterns and behaviors. ?Verbalize an understanding of the role that distorted  thinking plays in creating fears, excessive worry, and ruminations. ? ? ? ?(  Samantha Romero participated in the creation of the treatment plan) ? ? ?Buena Irish, LCSW ?

## 2021-06-23 ENCOUNTER — Encounter: Payer: BC Managed Care – PPO | Admitting: Psychology

## 2021-06-24 NOTE — Progress Notes (Signed)
This encounter was created in error - please disregard.

## 2021-06-25 ENCOUNTER — Ambulatory Visit (HOSPITAL_BASED_OUTPATIENT_CLINIC_OR_DEPARTMENT_OTHER): Payer: BC Managed Care – PPO | Attending: Sports Medicine | Admitting: Physical Therapy

## 2021-06-25 ENCOUNTER — Encounter (HOSPITAL_BASED_OUTPATIENT_CLINIC_OR_DEPARTMENT_OTHER): Payer: Self-pay | Admitting: Physical Therapy

## 2021-06-25 DIAGNOSIS — M6281 Muscle weakness (generalized): Secondary | ICD-10-CM | POA: Diagnosis present

## 2021-06-25 DIAGNOSIS — M25552 Pain in left hip: Secondary | ICD-10-CM | POA: Diagnosis not present

## 2021-06-25 NOTE — Therapy (Addendum)
OUTPATIENT PHYSICAL THERAPY TREATMENT NOTE   Patient Name: Samantha Romero MRN: [id number] DOB:May 17, 1975, 46 y.o., female Today's Date: 06/25/2021  PCP: [name], FNP REFERRING PROVIDER: [name], FNP   PT End of Session - 06/25/21 [id number]     Visit Number 2    Number of Visits 12    Date for PT Re-Evaluation 07/02/21    PT Start Time 0915    PT Stop Time 1000    PT Time Calculation (min) 45 min    Activity Tolerance Patient tolerated treatment well    Behavior During Therapy [facility] for tasks assessed/performed             Past Medical History:  Diagnosis Date   Anxiety    panic attacks   Asthma    CHF (congestive heart failure), NYHA class II, acute, systolic (HCC) 10/06/2018   Depression    Fibromyalgia    Hypertension    no meds currently   No pertinent past medical history    Pneumonia    hx of    Sinus problem    Past Surgical History:  Procedure Laterality Date   ESOPHAGOGASTRODUODENOSCOPY N/A 11/26/2014   Procedure: ESOPHAGOGASTRODUODENOSCOPY (EGD);  Surgeon: Ovidio Kin, MD;  Location: [facility];  Service: General;  Laterality: N/A;   GASTRIC BANDING PORT REVISION  08/24/11   LAPAROSCOPIC GASTRIC BANDING  12/26/2007   LAPAROSCOPIC LYSIS OF ADHESIONS N/A 01/06/2015   Procedure: LAPAROSCOPIC LYSIS OF ADHESIONS;  Surgeon: Luretha Murphy, MD;  Location: [facility];  Service: General;  Laterality: N/A;   LAPAROSCOPIC REPAIR AND REMOVAL OF GASTRIC BAND N/A 01/06/2015   Procedure: LAPAROSCOPIC RESIGHTING OF GASTRIC Band PORT;  Surgeon: Luretha Murphy, MD;  Location: [facility];  Service: General;  Laterality: N/A;   NO PAST SURGERIES     Patient Active Problem List   Diagnosis Date Noted   Left hip pain 09/30/2020   Environmental and seasonal allergies 07/24/2019   Asthma 10/06/2018   Hypertensive urgency 10/06/2018   GERD (gastroesophageal reflux disease) 10/06/2018   Depression with anxiety 10/06/2018   CHF (congestive heart failure), NYHA class II, acute,  systolic (HCC) 10/06/2018   Shortness of breath    Cardiomegaly    Snoring 10/04/2018   Essential hypertension 08/14/2018   Moderate persistent asthma with acute exacerbation 08/14/2018   Fibromyalgia 11/18/2016   Class 3 severe obesity due to excess calories without serious comorbidity with body mass index (BMI) of 45.0 to 49.9 in adult (HCC) 11/18/2016   Adjustment reaction with anxiety and depression 11/18/2016   Chronic bilateral low back pain without sciatica 09/22/2015   Myofascial pain 09/22/2015   Lapband port leaking-REVISED May 2013 09/10/2011   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 No NPRS scale: 0/10 current, 9/10 worst pain Pain location: lateral and post L hip; glute  Aggravating factors: lying on L side, standing to perform household chores, extended ambulation Relieving factors: massager with heat, lying on R side   PRECAUTIONS: Other: CHF   WEIGHT BEARING RESTRICTIONS No   FALLS:  Has patient fallen in last 6 months? Yes, Number of falls: 1     PLOF: Independent   PATIENT GOALS improve  pain, stamina, flexibility     OBJECTIVE:    DIAGNOSTIC FINDINGS:  x rays of L hip:   IMPRESSION: No acute abnormality noted. MD note indicated X-rays today of the left hip showed nothing acute.  Mild to moderate degenerative changes are seen at the hip joint.    PATIENT SURVEYS:  FOTO 33 with a goal of 57 at visit #13   COGNITION:          Overall cognitive status: Within functional limits for tasks assessed                            MUSCLE LENGTH: Pt has tightness in L piriformis     PALPATION: TTP:  lateral L hip and minimally in superior glute    LE  AROM/PROM:   A/PROM Right 05/21/2021 Left 05/21/2021  Hip flexion      Hip extension [facility]  Hip abduction WNL WNL  Hip adduction      Hip internal rotation Limited but symmetrical Limited but symmetrical  Hip external rotation [facility]  Knee flexion      Knee extension      Ankle dorsiflexion      Ankle plantarflexion      Ankle inversion      Ankle eversion       (Blank rows = not tested)   LE MMT:   MMT Right 05/21/2021 Left 05/21/2021  Hip flexion 5/5 5/5  Hip extension 4+/5 4/5  Hip abduction 5/5 4/5  Hip adduction      Hip internal rotation 4+/5 4+/5  Hip external rotation 5/5 5/5  Knee flexion 5/5 5/5  Knee extension 5/5 4+/5  Ankle dorsiflexion      Ankle plantarflexion      Ankle inversion      Ankle eversion       (Blank rows = not tested)   LOWER EXTREMITY SPECIAL TESTS:  Ober's Test negative bilat       GAIT: Assistive device utilized: None Level of assistance: Complete Independence Comments: Pt ambulates with a heel to toe gait pattern without limping.       TODAY'S TREATMENT: Pt seen for aquatic therapy today.  Treatment took place in water 3.25-4.8 ft in depth at the [facility] pool. Temp of water was 91.  Pt entered/exited the pool via stairs step through pattern independently with bilat rail. Re-introduction to pool Walking forward, back and side stepping 4 widths ea.  Seated flutter at hip; add/abd 4x 20-25reps. STS 3rd step x7 Standing step ups forward R/L x10. Hip extension 2x10 L/R Kick board push down with decreasing BOS 3x15.    Pt requires buoyancy for support and to offload joints with strengthening exercises. Viscosity of the water is needed for resistance of strengthening; water current perturbations provides challenge to standing balance unsupported, requiring increased core activation.     PATIENT EDUCATION:  Education details: HEP, POC, rationale of exercises, dx, prognosis, and objective findings. Person  educated: Patient Education method: Explanation and visual media Education comprehension: verbalized understanding     HOME EXERCISE PROGRAM: Prior Med[employer] Access Code:  [email or web address]   ASSESSMENT:   CLINICAL IMPRESSION: Pt demonstrates safety and indep in environment. She was being seen about 8 months ago and is familiar with aquatic therapy.  She is reporting no pain in left hip but feels like it is unsteady.  Pt is instructed on core stabilization and cued throughout exercises for core engagement. She  requires VC and demonstration for execution of all exercises as well as multiple seated rest periods. Worked on ROM and strengthening of core and LE's. Balance challenges incorporated as well. Post chain emphasis for isolated glute movement. She tolerates well reporting she can "feel" that her left hip was worked.  Patient is a 46 y.o. female with a dx of Left hip pain presenting to the clinic with L hip pain and L LE weakness in hip and minimally in knee extension.  Pt responded well to prior PT but was unable to finish PT.  She reports her pain and sx's have improved this past month.  Pt is limited with ambulation distance having increased pain with extended community ambulation.  She also has increased pain with standing activities including household chores.  Pt should benefit from skilled PT including aquatic therapy to address impairments and goals and improve overall function.         OBJECTIVE IMPAIRMENTS decreased activity tolerance, decreased endurance, decreased mobility, difficulty walking, decreased strength, hypomobility, impaired flexibility, and pain.    ACTIVITY LIMITATIONS cleaning, community activity, meal prep, and washing dishes and extended ambulation .    PERSONAL FACTORS 3+ comorbidities: CHF, morbid obesity, and fibromyalgia   are also affecting patient's functional outcome.      REHAB POTENTIAL: Good   CLINICAL DECISION MAKING: Stable/uncomplicated   EVALUATION  COMPLEXITY: Low     GOALS:     SHORT TERM GOALS:   STG Name Target Date Goal status  1 Pt will be independent and compliant with HEP for improved pain, strength, and function. Baseline:  06/11/2021 INITIAL  2 Pt will report worst pain to be <7/10 for improved extended ambulation and standing tolerance.  Baseline:  06/11/2021 INITIAL  3 Pt will report at least a 25% improvement in performance of household chores.  Baseline: 06/11/2021 INITIAL                                        LONG TERM GOALS:    LTG Name Target Date Goal status  1 Pt will be able to stand to cook and wash dishes without significant pain or difficulty.   Baseline: 07/02/2021 INITIAL  2 Pt will demo improved L hip abd and ext strength and L knee ext strength to 5/5 MMT for improved tolerance to functional mobility skills and household chores.  Baseline: 07/02/2021 INITIAL  3 Pt will report she is able to ambulate extended community distance without significant pain or difficulty  Baseline: 07/02/2021 INITIAL                                        PLAN: PT FREQUENCY: 2x/week   PT DURATION: 6 weeks   PLANNED INTERVENTIONS: Therapeutic exercises, Therapeutic activity, Neuro Muscular re-education, Gait training, Patient/Family education, Joint mobilization, Stair training, Aquatic Therapy, Dry Needling, Electrical stimulation, Spinal mobilization, Cryotherapy, Moist heat, Taping, Ultrasound, and Manual therapy   PLAN FOR NEXT SESSION: Aquatic therapy for the next 3-4 appointments and then return to land.  Progress HEP if tolerable and STM to glute.      Darivs Lunden Tomma Lightning) Quinterius Gaida MPT 06/25/2021, 10:11 AM  PHYSICAL THERAPY DISCHARGE SUMMARY  Visits from Start of Care: 2  Current functional level related to goals / functional outcomes: Unable to assess current functional status or goals  due to pt not being present at discharge.     Remaining deficits: Unable to assess   Education / Equipment: See above    Patient was seen in Pt on 05/21/21 and 06/25/21.  She then cancelled her following 2 appointments due to being sick and a conflict.  Pt did not return to PT and will be considered discharged at this time.   Audie Clear III PT, DPT 10/12/21 4:32 PM

## 2021-06-26 ENCOUNTER — Telehealth: Payer: Self-pay

## 2021-06-26 NOTE — Telephone Encounter (Signed)
Lvm for pt to call and schedule appt 

## 2021-06-28 ENCOUNTER — Other Ambulatory Visit: Payer: Self-pay | Admitting: Sports Medicine

## 2021-07-07 ENCOUNTER — Encounter: Payer: BC Managed Care – PPO | Admitting: Psychology

## 2021-07-07 NOTE — Progress Notes (Signed)
This encounter was created in error - please disregard.

## 2021-07-16 ENCOUNTER — Ambulatory Visit (HOSPITAL_BASED_OUTPATIENT_CLINIC_OR_DEPARTMENT_OTHER): Payer: BC Managed Care – PPO | Admitting: Physical Therapy

## 2021-07-21 ENCOUNTER — Ambulatory Visit (HOSPITAL_BASED_OUTPATIENT_CLINIC_OR_DEPARTMENT_OTHER): Payer: BC Managed Care – PPO | Admitting: Physical Therapy

## 2021-08-23 ENCOUNTER — Other Ambulatory Visit: Payer: Self-pay | Admitting: Sports Medicine

## 2021-09-21 ENCOUNTER — Other Ambulatory Visit: Payer: Self-pay | Admitting: Sports Medicine

## 2021-09-25 ENCOUNTER — Other Ambulatory Visit: Payer: Self-pay | Admitting: Nurse Practitioner

## 2021-10-03 ENCOUNTER — Telehealth: Payer: BC Managed Care – PPO | Admitting: Nurse Practitioner

## 2021-10-03 DIAGNOSIS — I509 Heart failure, unspecified: Secondary | ICD-10-CM | POA: Diagnosis not present

## 2021-10-03 DIAGNOSIS — R0602 Shortness of breath: Secondary | ICD-10-CM

## 2021-10-03 NOTE — Patient Instructions (Signed)
Shortness of Breath, Adult Shortness of breath means you have trouble breathing. Shortness of breath could be a sign of a medical problem. Follow these instructions at home:  Pollution Do not smoke or use any products that contain nicotine or tobacco. If you need help quitting, ask your doctor. Avoid things that can make it harder to breathe, such as: Smoke of all kinds. This includes smoke from campfires or forest fires. Do not smoke or allow others to smoke in your home. Mold. Dust. Air pollution. Chemical smells. Things that can give you an allergic reaction (allergens) if you have allergies. Keep your living space clean. Use products that help remove mold and dust. General instructions Watch for any changes in your symptoms. Take over-the-counter and prescription medicines only as told by your doctor. This includes oxygen therapy and inhaled medicines. Rest as needed. Return to your normal activities when your doctor says that it is safe. Keep all follow-up visits. Contact a doctor if: Your condition does not get better as soon as expected. You have a hard time doing your normal activities, even after you rest. You have new symptoms. You cannot walk up stairs. You cannot exercise the way you normally do. Get help right away if: Your shortness of breath gets worse. You have trouble breathing when you are resting. You feel light-headed or you faint. You have a cough that is not helped by medicines. You cough up blood. You have pain with breathing. You have pain in your chest, arms, shoulders, or belly (abdomen). You have a fever. These symptoms may be an emergency. Get help right away. Call 911. Do not wait to see if the symptoms will go away. Do not drive yourself to the hospital. Summary Shortness of breath is when you have trouble breathing enough air. It can be a sign of a medical problem. Avoid things that make it hard for you to breathe, such as smoking, pollution,  mold, and dust. Watch for any changes in your symptoms. Contact your doctor if you do not get better or you get worse. This information is not intended to replace advice given to you by your health care provider. Make sure you discuss any questions you have with your health care provider. Document Revised: 11/01/2020 Document Reviewed: 11/01/2020 Elsevier Patient Education  2023 Elsevier Inc.  

## 2021-10-03 NOTE — Progress Notes (Signed)
Virtual Visit Consent   Samantha Romero, you are scheduled for a virtual visit with Mary-Margaret Daphine Deutscher, FNP, a [facility] provider, today.     Just as with appointments in the office, your consent must be obtained to participate.  Your consent will be active for this visit and any virtual visit you may have with one of our providers in the next 365 days.     If you have a MyChart account, a copy of this consent can be sent to you electronically.  All virtual visits are billed to your insurance company just like a traditional visit in the office.    As this is a virtual visit, video technology does not allow for your provider to perform a traditional examination.  This may limit your provider's ability to fully assess your condition.  If your provider identifies any concerns that need to be evaluated in person or the need to arrange testing (such as labs, EKG, etc.), we will make arrangements to do so.     Although advances in technology are sophisticated, we cannot ensure that it will always work on either your end or our end.  If the connection with a video visit is poor, the visit may have to be switched to a telephone visit.  With either a video or telephone visit, we are not always able to ensure that we have a secure connection.     I need to obtain your verbal consent now.   Are you willing to proceed with your visit today? YES   Samantha Romero has provided verbal consent on 10/03/2021 for a virtual visit (video or telephone).   Mary-Margaret Daphine Deutscher, FNP   Date: 10/03/2021 9:08 AM   Virtual Visit via Video Note   I, Mary-Margaret Daphine Deutscher, connected with Samantha Romero ([id number][birth year]) on 10/03/21 at  9:15 AM EDT by a video-enabled telemedicine application and verified that I am speaking with the correct person using two identifiers.  Location: Patient: Virtual Visit Location Patient: Home Provider: Virtual Visit Location Provider: Mobile   I discussed the limitations of  evaluation and management by telemedicine and the availability of in person appointments. The patient expressed understanding and agreed to proceed.    History of Present Illness: Samantha Romero is a 46 y.o. who identifies as a female who was assigned female at birth, and is being seen today for SOB.  HPI: Patient states she was diagnosed with CHF about 2 years ago. She is on coreg and spirolactone. She has noticed in the last several months that she will get SOB some times walking to the mailbox or walking up stairs. Does not happen every time. She says if she sits the SOB will resolve. She does has slight swelling in her feet, because her shoes are feeling a little tight. She says she is over weight nad she knows she is out of shape. She denies any chest pain.    Review of Systems  Constitutional: Negative.   Respiratory:  Positive for shortness of breath.   Cardiovascular:  Positive for leg swelling (feet). Negative for chest pain and palpitations.  Neurological: Negative.     Problems:  Patient Active Problem List   Diagnosis Date Noted   Left hip pain 09/30/2020   Environmental and seasonal allergies 07/24/2019   Asthma 10/06/2018   Hypertensive urgency 10/06/2018   GERD (gastroesophageal reflux disease) 10/06/2018   Depression with anxiety 10/06/2018   CHF (congestive heart failure), NYHA class II, acute, systolic (  HCC) 10/06/2018   Shortness of breath    Cardiomegaly    Snoring 10/04/2018   Essential hypertension 08/14/2018   Moderate persistent asthma with acute exacerbation 08/14/2018   Fibromyalgia 11/18/2016   Class 3 severe obesity due to excess calories without serious comorbidity with body mass index (BMI) of 45.0 to 49.9 in adult (HCC) 11/18/2016   Adjustment reaction with anxiety and depression 11/18/2016   Chronic bilateral low back pain without sciatica 09/22/2015   Myofascial pain 09/22/2015   Lapband port leaking-REVISED May 2013 09/10/2011   Lapband APL Sept  2009 07/21/2011    Allergies:  Allergies  Allergen Reactions   Grass Extracts [Gramineae Pollens]     Nasal congestion, post nasal drip    Shellfish Allergy Itching    In throat only.   Medications:  Current Outpatient Medications:    albuterol (PROVENTIL) (2.5 MG/3ML) 0.083% nebulizer solution, Take 3 mLs (2.5 mg total) by nebulization every 6 (six) hours as needed for wheezing or shortness of breath., Disp: 150 mL, Rfl: 1   albuterol (VENTOLIN HFA) 108 (90 Base) MCG/ACT inhaler, Inhale 2 puffs into the lungs every 6 (six) hours as needed for wheezing or shortness of breath., Disp: 8 g, Rfl: 2   ALPRAZolam (XANAX) 1 MG tablet, TAKE 1 TABLET BY MOUTH 2 TIMES DAILY AS NEEDED FOR ANXIETY., Disp: 40 tablet, Rfl: 2   azelastine (ASTELIN) 0.1 % nasal spray, Place 2 sprays into both nostrils 2 (two) times daily., Disp: 30 mL, Rfl: 5   buPROPion (WELLBUTRIN XL) 150 MG 24 hr tablet, TAKE 3 TABLETS BY MOUTH DAILY., Disp: 270 tablet, Rfl: 2   carvedilol (COREG) 25 MG tablet, TAKE 1 TABLET (25 MG TOTAL) BY MOUTH 2 (TWO) TIMES DAILY WITH A MEAL., Disp: 180 tablet, Rfl: 1   DULoxetine (CYMBALTA) 60 MG capsule, TAKE 1 CAPSULE BY MOUTH EVERY DAY, Disp: 90 capsule, Rfl: 1   fluticasone (FLONASE) 50 MCG/ACT nasal spray, USE 1 SPRAY IN EACH NOSTRIL EVERY MORNING, Disp: 16 mL, Rfl: 1   Fluticasone-Salmeterol 232-14 MCG/ACT AEPB, Inhale 1 puff into the lungs in the morning and at bedtime., Disp: 1 each, Rfl: 5   furosemide (LASIX) 40 MG tablet, Take 1 tablet (40 mg total) by mouth 2 (two) times daily., Disp: 180 tablet, Rfl: 1   JARDIANCE 10 MG TABS tablet, TAKE ONE TABLET BY MOUTH DAILY BEFORE BREAKFAST, Disp: 90 tablet, Rfl: 1   levocetirizine (XYZAL) 5 MG tablet, TAKE 1 TABLET BY MOUTH EVERY DAY IN THE EVENING, Disp: 90 tablet, Rfl: 0   meloxicam (MOBIC) 15 MG tablet, TAKE 1 TABLET BY MOUTH EVERY DAY AS NEEDED FOR PAIN, Disp: 30 tablet, Rfl: 0   montelukast (SINGULAIR) 10 MG tablet, TAKE 1 TABLET BY MOUTH  EVERYDAY AT BEDTIME (Patient taking differently: Take 10 mg by mouth at bedtime.), Disp: 90 tablet, Rfl: 2   omeprazole (PRILOSEC) 40 MG capsule, TAKE 1 CAPSULE BY MOUTH EVERY DAY (Patient taking differently: Take 40 mg by mouth daily.), Disp: 90 capsule, Rfl: 1   sacubitril-valsartan (ENTRESTO) 97-103 MG, Take 1 tablet by mouth 2 (two) times daily., Disp: 60 tablet, Rfl: 6   Semaglutide,0.25 or 0.5MG /DOS, (OZEMPIC, 0.25 OR 0.5 MG/DOSE,) 2 MG/1.5ML SOPN, Inject 0.5 mg into the skin once a week. (Patient taking differently: Inject 0.5 mg into the skin every Thursday.), Disp: 4.5 mL, Rfl: 1   spironolactone (ALDACTONE) 25 MG tablet, TAKE 1 TABLET (25 MG TOTAL) BY MOUTH DAILY., Disp: 90 tablet, Rfl: 1   traZODone (DESYREL)  50 MG tablet, TAKE 1 TABLET BY MOUTH EVERYDAY AT BEDTIME, Disp: 30 tablet, Rfl: 2  Observations/Objective: Patient is well-developed, well-nourished in no acute distress.  Resting comfortably  at home.  Head is normocephalic, atraumatic.  No labored breathing.  Speech is clear and coherent with logical content.  Patient is alert and oriented at baseline.  No SOB noted during visit  Assessment and Plan:  Samantha Romero in today with chief complaint of Shortness of Breath   1. SOB (shortness of breath)  2. Congestive heart failure, unspecified HF chronicity, unspecified heart failure type (HCC)  Rest  over the weekend- no strenuous activity- if become SOB with activity, stop and rest.  Prop legs up when sitting Make an appointment on Monday to see her PCP for evaluation If has SOB at rest- needs to be seen by the ED     Follow Up Instructions: I discussed the assessment and treatment plan with the patient. The patient was provided an opportunity to ask questions and all were answered. The patient agreed with the plan and demonstrated an understanding of the instructions.  A copy of instructions were sent to the patient via MyChart.  The patient was advised to call  back or seek an in-person evaluation if the symptoms worsen or if the condition fails to improve as anticipated.  Time:  I spent 12 minutes with the patient via telehealth technology discussing the above problems/concerns.    Mary-Margaret Daphine Deutscher, FNP

## 2021-10-04 ENCOUNTER — Encounter: Payer: Self-pay | Admitting: Interventional Cardiology

## 2021-10-05 ENCOUNTER — Telehealth: Payer: Self-pay

## 2021-10-05 NOTE — Telephone Encounter (Signed)
Samantha Romero  P [facility] Triage (supporting Lyn Records, MD) 18 hours ago (2:35 PM)    Good afternoon. For the last week, I've experienced symptoms similar to the ones that led me to be admitted to the hospital in 2020. Everything makes me tired. I think Im in danger and I'm preparing a bag to go to the ER. I am alone without an advocate and hope to speak to someone from this practice since you know my history

## 2021-10-05 NOTE — Telephone Encounter (Signed)
See mychart message and recent PCP visit.  Pt scheduled with DOD 10/06/2021.

## 2021-10-05 NOTE — Telephone Encounter (Signed)
Attempted outreach to Pt.  Left message requesting call back to schedule with DOD.

## 2021-10-05 NOTE — Progress Notes (Unsigned)
Cardiology Office Note:    Date:  10/06/2021   ID:  Samantha Romero, DOB [birth year], MRN [id number]  PCP:  [name], FNP   [facility] Providers Cardiologist:  Lesleigh Noe, MD Sleep Medicine:  Armanda Magic, MD {   Referring MD: [name], FNP    History of Present Illness:    Samantha Romero is a 46 y.o. female with a hx of systolic heart failure (LVEF 40% in 09/2018>30% in 03/2019) secondary to NICM, morbid obesity, OSA on CPAP, anxiety and depression who is followed by Dr. Katrinka Blazing who presents to clinic for urgent visit for worsening fatigue.  Today, the patient states that for the past month, she has noticed that she has become progressively short of breath with activity. Also noted that she has gained about 11lbs over this time period. Has trace LE edema as well as abdominal bloating. No orthopnea or PND. No lightheadedness, dizziness or palpitations. No exertional chest pain but has some soreness in the right chest after moving a heavy object. To try to treat her symptoms, she would take a half of a xanax and take her lasix 80mg  daily, which has helped some. Notably, over the past month, she has missed several doses of her medications and has eaten more take-out which is likely the primary driver of her acute decompensation.  Past Medical History:  Diagnosis Date   Anxiety    panic attacks   Asthma    CHF (congestive heart failure), NYHA class II, acute, systolic (HCC) 10/06/2018   Depression    Fibromyalgia    Hypertension    no meds currently   No pertinent past medical history    Pneumonia    hx of    Sinus problem     Past Surgical History:  Procedure Laterality Date   ESOPHAGOGASTRODUODENOSCOPY N/A 11/26/2014   Procedure: ESOPHAGOGASTRODUODENOSCOPY (EGD);  Surgeon: 11/28/2014, MD;  Location: [facility];  Service: General;  Laterality: N/A;   GASTRIC BANDING PORT REVISION  08/24/11   LAPAROSCOPIC GASTRIC BANDING  12/26/2007   LAPAROSCOPIC LYSIS OF  ADHESIONS N/A 01/06/2015   Procedure: LAPAROSCOPIC LYSIS OF ADHESIONS;  Surgeon: 03/08/2015, MD;  Location: [facility];  Service: General;  Laterality: N/A;   LAPAROSCOPIC REPAIR AND REMOVAL OF GASTRIC BAND N/A 01/06/2015   Procedure: LAPAROSCOPIC RESIGHTING OF GASTRIC Band PORT;  Surgeon: 03/08/2015, MD;  Location: [facility];  Service: General;  Laterality: N/A;   NO PAST SURGERIES      Current Medications: Current Meds  Medication Sig   albuterol (PROVENTIL) (2.5 MG/3ML) 0.083% nebulizer solution Take 3 mLs (2.5 mg total) by nebulization every 6 (six) hours as needed for wheezing or shortness of breath.   albuterol (VENTOLIN HFA) 108 (90 Base) MCG/ACT inhaler Inhale 2 puffs into the lungs every 6 (six) hours as needed for wheezing or shortness of breath.   ALPRAZolam (XANAX) 1 MG tablet TAKE 1 TABLET BY MOUTH 2 TIMES DAILY AS NEEDED FOR ANXIETY.   azelastine (ASTELIN) 0.1 % nasal spray Place 2 sprays into both nostrils 2 (two) times daily.   buPROPion (WELLBUTRIN XL) 150 MG 24 hr tablet TAKE 3 TABLETS BY MOUTH DAILY.   carvedilol (COREG) 25 MG tablet TAKE 1 TABLET (25 MG TOTAL) BY MOUTH 2 (TWO) TIMES DAILY WITH A MEAL.   DULoxetine (CYMBALTA) 60 MG capsule TAKE 1 CAPSULE BY MOUTH EVERY DAY   fluticasone (FLONASE) 50 MCG/ACT nasal spray USE 1 SPRAY IN EACH NOSTRIL EVERY MORNING  Fluticasone-Salmeterol 232-14 MCG/ACT AEPB Inhale 1 puff into the lungs in the morning and at bedtime.   furosemide (LASIX) 80 MG tablet Take 1 tablet (80 mg total) by mouth twice daily for 3 days only, then decrease to taking 1 tablet (80 mg total) by mouth daily thereafter.   JARDIANCE 10 MG TABS tablet TAKE ONE TABLET BY MOUTH DAILY BEFORE BREAKFAST   levocetirizine (XYZAL) 5 MG tablet TAKE 1 TABLET BY MOUTH EVERY DAY IN THE EVENING   levonorgestrel (MIRENA, 52 MG,) 20 MCG/DAY IUD    meloxicam (MOBIC) 15 MG tablet TAKE 1 TABLET BY MOUTH EVERY DAY AS NEEDED FOR PAIN   montelukast (SINGULAIR) 10 MG tablet TAKE 1  TABLET BY MOUTH EVERYDAY AT BEDTIME (Patient taking differently: Take 10 mg by mouth at bedtime.)   omeprazole (PRILOSEC) 40 MG capsule TAKE 1 CAPSULE BY MOUTH EVERY DAY (Patient taking differently: Take 40 mg by mouth daily.)   potassium chloride SA (KLOR-CON M) 20 MEQ tablet Take 1 tablet (20 mEq total) by mouth twice daily for 3 days only, then decrease to taking 1 tablet (20 mEq total) by mouth daily thereafter.   sacubitril-valsartan (ENTRESTO) 97-103 MG Take 1 tablet by mouth 2 (two) times daily.   Semaglutide,0.25 or 0.5MG /DOS, (OZEMPIC, 0.25 OR 0.5 MG/DOSE,) 2 MG/1.5ML SOPN Inject 0.5 mg into the skin once a week. (Patient taking differently: Inject 0.5 mg into the skin every Thursday.)   spironolactone (ALDACTONE) 25 MG tablet TAKE 1 TABLET (25 MG TOTAL) BY MOUTH DAILY.   traZODone (DESYREL) 50 MG tablet TAKE 1 TABLET BY MOUTH EVERYDAY AT BEDTIME   [DISCONTINUED] furosemide (LASIX) 40 MG tablet Take 1 tablet (40 mg total) by mouth 2 (two) times daily.     Allergies:   Grass extracts [gramineae pollens] and Shellfish allergy   Social History   Socioeconomic History   Marital status: Single    Spouse name: Not on file   Number of children: Not on file   Years of education: Not on file   Highest education level: Not on file  Occupational History   Not on file  Tobacco Use   Smoking status: Never   Smokeless tobacco: Never  Vaping Use   Vaping Use: Never used  Substance and Sexual Activity   Alcohol use: Never   Drug use: Never   Sexual activity: Not Currently    Birth control/protection: I.U.D.    Comment: Mirena  Other Topics Concern   Not on file  Social History Narrative   Not on file   Social Determinants of Health   Financial Resource Strain: Not on file  Food Insecurity: Not on file  Transportation Needs: Not on file  Physical Activity: Not on file  Stress: Not on file  Social Connections: Not on file     Family History: The patient's family history  includes Cancer in her maternal grandfather and maternal uncle; Hypertension in her mother; Ulcers in her mother.  ROS:   Please see the history of present illness.     All other systems reviewed and are negative.  EKGs/Labs/Other Studies Reviewed:    The following studies were reviewed today: TTE 12-11-2019: IMPRESSIONS     1. Poor endocardial borders preclude accurate assessment of EF and wall  motion. Left ventricular endocardial border not optimally defined to  evaluate regional wall motion. There is mild concentric left ventricular  hypertrophy. Left ventricular diastolic   parameters were normal.   2. Right ventricular systolic function is normal. The right  ventricular  size is normal. Tricuspid regurgitation signal is inadequate for assessing  PA pressure.   3. The mitral valve is normal in structure. Trivial mitral valve  regurgitation. No evidence of mitral stenosis.   4. The aortic valve is normal in structure. Aortic valve regurgitation is  not visualized. No aortic stenosis is present.   5. The inferior vena cava is normal in size with greater than 50%  respiratory variability, suggesting right atrial pressure of 3 mmHg.   6. Recommend repeat limited echo to assess LVF and wall motion.   EKG:  EKG is  ordered today.  The ekg ordered today demonstrates sinus tachycardia with HR 102  Recent Labs: 10/08/2020: BUN 11; Creatinine, Ser 1.02; Hemoglobin 13.5; Platelets 227; Potassium 3.7; Sodium 138  Recent Lipid Panel    Component Value Date/Time   CHOL 158 11/19/2019 1217   TRIG 93 11/19/2019 1217   HDL 50 11/19/2019 1217   CHOLHDL 3.2 11/19/2019 1217   LDLCALC 91 11/19/2019 1217     Risk Assessment/Calculations:           Physical Exam:    VS:  BP 110/82   Pulse (!) 102   Ht 5\' 2"  (1.575 m)   Wt (!) 319 lb 12.8 oz (145.1 kg)   SpO2 93%   BMI 58.49 kg/m     Wt Readings from Last 3 Encounters:  10/06/21 (!) 319 lb 12.8 oz (145.1 kg)  04/20/21 (!) 313 lb  6.4 oz (142.2 kg)  04/14/21 (!) 308 lb (139.7 kg)     GEN:  Well nourished, well developed in no acute distress HEENT: Normal NECK: No JVD; No carotid bruits CARDIAC: RRR, no murmurs, rubs, gallops RESPIRATORY:  Clear to auscultation without rales, wheezing or rhonchi  ABDOMEN: Soft, non-tender, non-distended MUSCULOSKELETAL:  Trace edema, warm  SKIN: Warm and dry NEUROLOGIC:  Alert and oriented x 3 PSYCHIATRIC:  Normal affect   ASSESSMENT:    1. Acute on chronic systolic (congestive) heart failure (HCC)   2. Medication management   3. Essential hypertension   4. Shortness of breath   5. OSA on CPAP   6. Morbid obesity (HCC)   7. Nonischemic cardiomyopathy (HCC)    PLAN:    In order of problems listed above:  #Acute on Chronic Systolic HF: #NICM: LVEF 25-30% in 03/2019 with severe LV dilation, normal RV, mild MR, mild TR. Currently, volume overloaded with 11lbs weight gain over the past month. Likely triggered by medication noncompliance and dietary indiscretions.  -Increase lasix to 80mg  BID x3 days and then daily thereafter -Start postassium 04/2019 BID x3 days and then daily thereafter -Check TTE -Check BMET, BNP -Continue coreg 25mg  BID -Continue jardiance 10mg  daily -Continue entresto 97-103mg  BID -Continue spironolactone 25mg  daily -BMET in 10 days after diuresis -Low Na diet  #HTN: Well controlled and at goal. -Continue entresto 97-103mg  BID -Continue spironolactone 25mg  daily -Continue coreg 25mg  BID  #OSA: -On CPAP  #Morbid Obesity: -Continue ozempic -Continue lifestyle modifications          Medication Adjustments/Labs and Tests Ordered: Current medicines are reviewed at length with the patient today.  Concerns regarding medicines are outlined above.  Orders Placed This Encounter  Procedures   Basic metabolic panel   Basic metabolic panel   Pro b natriuretic peptide   EKG 12-Lead   ECHOCARDIOGRAM COMPLETE   Meds ordered this encounter   Medications   potassium chloride SA (KLOR-CON M) 20 MEQ tablet    Sig: Take 1  tablet (20 mEq total) by mouth twice daily for 3 days only, then decrease to taking 1 tablet (20 mEq total) by mouth daily thereafter.    Dispense:  36 tablet    Refill:  0    Take in concurrence with lasix.   furosemide (LASIX) 80 MG tablet    Sig: Take 1 tablet (80 mg total) by mouth twice daily for 3 days only, then decrease to taking 1 tablet (80 mg total) by mouth daily thereafter.    Dispense:  36 tablet    Refill:  0    Dose increase    Patient Instructions  Medication Instructions:   INCREASE YOUR LASIX TO 80 MG BY MOUTH TWICE DAILY FOR 3 DAYS ONLY, THEN DECREASE TO TAKING 80 MG BY MOUTH DAILY THEREAFTER  INCREASE YOUR POTASSIUM CHLORIDE TO 20 mEq BY MOUTH TWICE DAILY FOR 3 DAYS ONLY, THEN DECREASE TO TAKING 20 mEq BY MOUTH DAILY THEREAFTER   *If you need a refill on your cardiac medications before your next appointment, please call your pharmacy*   Lab Work:  1.) TODAY--BMET AND PRO-BNP  2.) IN 10 DAYS--BMET  If you have labs (blood work) drawn today and your tests are completely normal, you will receive your results only by: MyChart Message (if you have MyChart) OR A paper copy in the mail If you have any lab test that is abnormal or we need to change your treatment, we will call you to review the results.   Testing/Procedures:  Your physician has requested that you have an echocardiogram. Echocardiography is a painless test that uses sound waves to create images of your heart. It provides your doctor with information about the size and shape of your heart and how well your heart's chambers and valves are working. This procedure takes approximately one hour. There are no restrictions for this procedure.    Follow-Up:  3 MONTHS WITH DR. Katrinka Blazing IN THE OFFICE   --PLEASE MAKE SURE YOU ARE DRY WEIGHING YOURSELF AND RECORDING THIS DAILY   Important Information About Sugar          Signed, Meriam Sprague, MD  10/06/2021 10:06 AM    [facility]

## 2021-10-06 ENCOUNTER — Encounter: Payer: Self-pay | Admitting: Cardiology

## 2021-10-06 ENCOUNTER — Ambulatory Visit: Payer: BC Managed Care – PPO | Admitting: Cardiology

## 2021-10-06 VITALS — BP 110/82 | HR 102 | Ht 62.0 in | Wt 319.8 lb

## 2021-10-06 DIAGNOSIS — I10 Essential (primary) hypertension: Secondary | ICD-10-CM

## 2021-10-06 DIAGNOSIS — Z79899 Other long term (current) drug therapy: Secondary | ICD-10-CM | POA: Diagnosis not present

## 2021-10-06 DIAGNOSIS — R0602 Shortness of breath: Secondary | ICD-10-CM | POA: Diagnosis not present

## 2021-10-06 DIAGNOSIS — G4733 Obstructive sleep apnea (adult) (pediatric): Secondary | ICD-10-CM

## 2021-10-06 DIAGNOSIS — I428 Other cardiomyopathies: Secondary | ICD-10-CM

## 2021-10-06 DIAGNOSIS — I5023 Acute on chronic systolic (congestive) heart failure: Secondary | ICD-10-CM | POA: Diagnosis not present

## 2021-10-06 DIAGNOSIS — Z9989 Dependence on other enabling machines and devices: Secondary | ICD-10-CM

## 2021-10-06 MED ORDER — FUROSEMIDE 80 MG PO TABS: ORAL_TABLET | ORAL | 0 refills | Status: DC

## 2021-10-06 MED ORDER — POTASSIUM CHLORIDE CRYS ER 20 MEQ PO TBCR: EXTENDED_RELEASE_TABLET | ORAL | 0 refills | Status: DC

## 2021-10-06 NOTE — Patient Instructions (Signed)
Medication Instructions:   INCREASE YOUR LASIX TO 80 MG BY MOUTH TWICE DAILY FOR 3 DAYS ONLY, THEN DECREASE TO TAKING 80 MG BY MOUTH DAILY THEREAFTER  INCREASE YOUR POTASSIUM CHLORIDE TO 20 mEq BY MOUTH TWICE DAILY FOR 3 DAYS ONLY, THEN DECREASE TO TAKING 20 mEq BY MOUTH DAILY THEREAFTER   *If you need a refill on your cardiac medications before your next appointment, please call your pharmacy*   Lab Work:  1.) TODAY--BMET AND PRO-BNP  2.) IN 10 DAYS--BMET  If you have labs (blood work) drawn today and your tests are completely normal, you will receive your results only by: MyChart Message (if you have MyChart) OR A paper copy in the mail If you have any lab test that is abnormal or we need to change your treatment, we will call you to review the results.   Testing/Procedures:  Your physician has requested that you have an echocardiogram. Echocardiography is a painless test that uses sound waves to create images of your heart. It provides your doctor with information about the size and shape of your heart and how well your heart's chambers and valves are working. This procedure takes approximately one hour. There are no restrictions for this procedure.    Follow-Up:  3 MONTHS WITH DR. Katrinka Blazing IN THE OFFICE   --PLEASE MAKE SURE YOU ARE DRY WEIGHING YOURSELF AND RECORDING THIS DAILY   Important Information About Sugar

## 2021-10-09 LAB — BASIC METABOLIC PANEL
BUN/Creatinine Ratio: 13 (ref 9–23)
BUN: 12 mg/dL (ref 6–24)
CO2: 24 mmol/L (ref 20–29)
Calcium: 9.8 mg/dL (ref 8.7–10.2)
Chloride: 100 mmol/L (ref 96–106)
Creatinine, Ser: 0.91 mg/dL (ref 0.57–1.00)
Glucose: 89 mg/dL (ref 70–99)
Potassium: 3.7 mmol/L (ref 3.5–5.2)
Sodium: 138 mmol/L (ref 134–144)
eGFR: 80 mL/min/{1.73_m2} (ref >59)

## 2021-10-09 LAB — PRO B NATRIURETIC PEPTIDE: NT-Pro BNP: <36 pg/mL (ref 0–130)

## 2021-10-16 ENCOUNTER — Other Ambulatory Visit: Payer: BC Managed Care – PPO

## 2021-10-20 ENCOUNTER — Ambulatory Visit (HOSPITAL_COMMUNITY): Payer: BC Managed Care – PPO | Attending: Internal Medicine

## 2021-10-20 DIAGNOSIS — I5023 Acute on chronic systolic (congestive) heart failure: Secondary | ICD-10-CM | POA: Insufficient documentation

## 2021-10-20 DIAGNOSIS — Z79899 Other long term (current) drug therapy: Secondary | ICD-10-CM | POA: Insufficient documentation

## 2021-10-20 DIAGNOSIS — R0602 Shortness of breath: Secondary | ICD-10-CM | POA: Insufficient documentation

## 2021-10-20 DIAGNOSIS — I10 Essential (primary) hypertension: Secondary | ICD-10-CM | POA: Insufficient documentation

## 2021-10-20 LAB — ECHOCARDIOGRAM COMPLETE
Area-P 1/2: 4.39 cm2
S' Lateral: 2.5 cm

## 2021-10-20 MED ORDER — PERFLUTREN LIPID MICROSPHERE
1.0000 mL | INTRAVENOUS | Status: AC | PRN
  Administered 2021-10-20: 1 mL via INTRAVENOUS

## 2021-11-12 ENCOUNTER — Other Ambulatory Visit: Payer: Self-pay | Admitting: Nurse Practitioner

## 2021-11-13 ENCOUNTER — Other Ambulatory Visit: Payer: Self-pay | Admitting: Nurse Practitioner

## 2021-11-13 DIAGNOSIS — F418 Other specified anxiety disorders: Secondary | ICD-10-CM

## 2021-11-19 ENCOUNTER — Other Ambulatory Visit: Payer: Self-pay | Admitting: Nurse Practitioner

## 2021-11-27 ENCOUNTER — Other Ambulatory Visit: Payer: Self-pay | Admitting: Nurse Practitioner

## 2021-11-27 DIAGNOSIS — K219 Gastro-esophageal reflux disease without esophagitis: Secondary | ICD-10-CM

## 2022-01-03 NOTE — Progress Notes (Unsigned)
Cardiology Office Note:    Date:  01/04/2022   ID:  Samantha Romero, DOB [birth year], MRN [id number]  PCP:  [name], FNP  Cardiologist:  Lesleigh Noe, MD   Referring MD: [name], FNP   No chief complaint on file.   History of Present Illness:    Samantha Romero is a 46 y.o. female with a hx of  acute systolic heart failure (7/20 EF 40%; 1/21 EF 30%; 9/21 EF 40%; 7/23 EF 65% ), non-ischemic CM,  morbid obesity, medication compliance breaches, obstructive sleep apnea, CPAP, bariatric surgery, anxiety & depression, and asthma.  Saw Dr. Shari Prows with A/C combined systolic and diastolic HF 10/06/2021, and diuresis was intensified.  After a bump in her diuretic therapy by Dr. Shari Prows in July, she lost weight and breathing significantly improved.  The most recent echo shows normalization of LV function normal 4 pronged heart failure therapy as outlined below.  She is having a terrible time with her weight.  It is gradually increasing.  Despite using a higher dose diuretic she has gained weight slightly since seeing Dr. Shari Prows in July.  Past Medical History:  Diagnosis Date   Anxiety    panic attacks   Asthma    CHF (congestive heart failure), NYHA class II, acute, systolic (HCC) 10/06/2018   Depression    Fibromyalgia    Hypertension    no meds currently   No pertinent past medical history    Pneumonia    hx of    Sinus problem     Past Surgical History:  Procedure Laterality Date   ESOPHAGOGASTRODUODENOSCOPY N/A 11/26/2014   Procedure: ESOPHAGOGASTRODUODENOSCOPY (EGD);  Surgeon: [facility], MD;  Location: [facility];  Service: General;  Laterality: N/A;   GASTRIC BANDING PORT REVISION  08/24/11   LAPAROSCOPIC GASTRIC BANDING  12/26/2007   LAPAROSCOPIC LYSIS OF ADHESIONS N/A 01/06/2015   Procedure: LAPAROSCOPIC LYSIS OF ADHESIONS;  Surgeon: Luretha Murphy, MD;  Location: [facility];  Service: General;  Laterality: N/A;   LAPAROSCOPIC REPAIR AND REMOVAL OF  GASTRIC BAND N/A 01/06/2015   Procedure: LAPAROSCOPIC RESIGHTING OF GASTRIC Band PORT;  Surgeon: Luretha Murphy, MD;  Location: [facility];  Service: General;  Laterality: N/A;   NO PAST SURGERIES      Current Medications: Current Meds  Medication Sig   albuterol (PROVENTIL) (2.5 MG/3ML) 0.083% nebulizer solution Take 3 mLs (2.5 mg total) by nebulization every 6 (six) hours as needed for wheezing or shortness of breath.   albuterol (VENTOLIN HFA) 108 (90 Base) MCG/ACT inhaler Inhale 2 puffs into the lungs every 6 (six) hours as needed for wheezing or shortness of breath.   ALPRAZolam (XANAX) 1 MG tablet TAKE 1 TABLET BY MOUTH 2 TIMES DAILY AS NEEDED FOR ANXIETY.   azelastine (ASTELIN) 0.1 % nasal spray Place 2 sprays into both nostrils 2 (two) times daily.   buPROPion (WELLBUTRIN XL) 150 MG 24 hr tablet TAKE 3 TABLETS BY MOUTH DAILY.   carvedilol (COREG) 25 MG tablet TAKE 1 TABLET (25 MG TOTAL) BY MOUTH TWICE A DAY WITH MEALS   DULoxetine (CYMBALTA) 60 MG capsule TAKE 1 CAPSULE BY MOUTH EVERY DAY   Fluticasone-Salmeterol 232-14 MCG/ACT AEPB Inhale 1 puff into the lungs in the morning and at bedtime.   furosemide (LASIX) 80 MG tablet Take 80 mg by mouth daily.   JARDIANCE 10 MG TABS tablet TAKE ONE TABLET BY MOUTH DAILY BEFORE BREAKFAST   levocetirizine (XYZAL) 5 MG tablet TAKE 1 TABLET BY MOUTH  EVERY DAY IN THE EVENING   levonorgestrel (MIRENA, 52 MG,) 20 MCG/DAY IUD    meloxicam (MOBIC) 15 MG tablet TAKE 1 TABLET BY MOUTH EVERY DAY AS NEEDED FOR PAIN   montelukast (SINGULAIR) 10 MG tablet TAKE 1 TABLET BY MOUTH EVERYDAY AT BEDTIME (Patient taking differently: Take 10 mg by mouth at bedtime.)   omeprazole (PRILOSEC) 40 MG capsule TAKE 1 CAPSULE BY MOUTH EVERY DAY (Patient taking differently: Take 40 mg by mouth daily.)   sacubitril-valsartan (ENTRESTO) 97-103 MG Take 1 tablet by mouth 2 (two) times daily.   Semaglutide,0.25 or 0.5MG /DOS, (OZEMPIC, 0.25 OR 0.5 MG/DOSE,) 2 MG/1.5ML SOPN Inject 0.5  mg into the skin once a week. (Patient taking differently: Inject 0.5 mg into the skin every Thursday.)   spironolactone (ALDACTONE) 25 MG tablet TAKE 1 TABLET (25 MG TOTAL) BY MOUTH DAILY.   traZODone (DESYREL) 50 MG tablet TAKE 1 TABLET BY MOUTH EVERYDAY AT BEDTIME (Patient taking differently: Take 50 mg by mouth as needed for sleep.)     Allergies:   Grass extracts [gramineae pollens] and Shellfish allergy   Social History   Socioeconomic History   Marital status: Single    Spouse name: Not on file   Number of children: Not on file   Years of education: Not on file   Highest education level: Not on file  Occupational History   Not on file  Tobacco Use   Smoking status: Never   Smokeless tobacco: Never  Vaping Use   Vaping Use: Never used  Substance and Sexual Activity   Alcohol use: Never   Drug use: Never   Sexual activity: Not Currently    Birth control/protection: I.U.D.    Comment: Mirena  Other Topics Concern   Not on file  Social History Narrative   Not on file   Social Determinants of Health   Financial Resource Strain: Not on file  Food Insecurity: Not on file  Transportation Needs: Not on file  Physical Activity: Not on file  Stress: Not on file  Social Connections: Not on file     Family History: The patient's family history includes Cancer in her maternal grandfather and maternal uncle; Hypertension in her mother; Ulcers in her mother.  ROS:   Please see the history of present illness.    Improved compared to July.  Understands that physical activity is helpful.  She wants to try to get in cardiac rehab.  We have referred her at least twice in the past but she never followed up.  She becomes discouraged when she can get started right away.  The weight is still 2 to 3 months.  I encouraged her to be persistent and steadfast with the idea that exercise will help improve her overall situation.  All other systems reviewed and are negative.  EKGs/Labs/Other  Studies Reviewed:    The following studies were reviewed today:  ECHOCARDIOGRAM 09/2021: IMPRESSIONS   1. Left ventricular ejection fraction, by estimation, is 60 to 65%. The  left ventricle has normal function. The left ventricle has no regional  wall motion abnormalities. Left ventricular diastolic parameters were  normal.   2. Right ventricular systolic function is normal. The right ventricular  size is normal. Tricuspid regurgitation signal is inadequate for assessing  PA pressure.   3. The mitral valve is normal in structure. No evidence of mitral valve  regurgitation. No evidence of mitral stenosis.   4. The aortic valve was not well visualized. Aortic valve regurgitation  is not  visualized.   5. The inferior vena cava is normal in size with greater than 50%  respiratory variability, suggesting right atrial pressure of 3 mmHg.   Comparison(s): No significant change from prior study. Prior study done  without contrast.   EKG:  EKG not repeated  Recent Labs: 10/06/2021: BUN 12; Creatinine, Ser 0.91; NT-Pro BNP <36; Potassium 3.7; Sodium 138  Recent Lipid Panel    Component Value Date/Time   CHOL 158 11/19/2019 1217   TRIG 93 11/19/2019 1217   HDL 50 11/19/2019 1217   CHOLHDL 3.2 11/19/2019 1217   LDLCALC 91 11/19/2019 1217    Physical Exam:    VS:  BP 128/78   Pulse 93   Ht 5\' 2"  (1.575 m)   Wt (!) 320 lb 9.6 oz (145.4 kg)   SpO2 100%   BMI 58.64 kg/m     Wt Readings from Last 3 Encounters:  01/04/22 (!) 320 lb 9.6 oz (145.4 kg)  10/06/21 (!) 319 lb 12.8 oz (145.1 kg)  04/20/21 (!) 313 lb 6.4 oz (142.2 kg)     GEN: Morbid. No acute distress HEENT: Normal NECK: No JVD. LYMPHATICS: No lymphadenopathy CARDIAC: No murmur. RRR no gallop, or edema. VASCULAR: - Normal Pulses. No bruits. RESPIRATORY:  Clear to auscultation without rales, wheezing or rhonchi  ABDOMEN: Soft, non-tender, non-distended, No pulsatile mass, MUSCULOSKELETAL: No deformity  SKIN: Warm  and dry NEUROLOGIC:  Alert and oriented x 3 PSYCHIATRIC:  Normal affect   ASSESSMENT:    1. Chronic systolic heart failure (HCC)   2. OSA on CPAP   3. Morbid obesity (HCC)   4. Essential hypertension   5. Nonischemic cardiomyopathy (HCC)    PLAN:    In order of problems listed above:  Normalization of LV function on 4-prong therapy for systolic heart failure.  She did get "wet" 3 months ago that improved with augmentation of diuretic therapy by Dr. 04/22/21.  She is now back to 80 mg of furosemide daily.  She is taking her therapy reliably.\ She is not compliant with CPAP Weight is increasing.  She is on Shari Prows.  She needs exercise. Excellent blood pressure control on current medical regimen Improved with 4-prong therapy for systolic heart failure.  Enroll in cardiac rehab  Aerobic activity  62-month follow-up, she request Dr. 8-month.   Recent laboratory data is normal.   Medication Adjustments/Labs and Tests Ordered: Current medicines are reviewed at length with the patient today.  Concerns regarding medicines are outlined above.  No orders of the defined types were placed in this encounter.  No orders of the defined types were placed in this encounter.   Patient Instructions  Medication Instructions:  Your physician recommends that you continue on your current medications as directed. Please refer to the Current Medication list given to you today.  *If you need a refill on your cardiac medications before your next appointment, please call your pharmacy*  Lab Work: NONE  Testing/Procedures: NONE  Follow-Up: At [facility], you and your health needs are our priority.  As part of our continuing mission to provide you with exceptional heart care, we have created designated Provider Care Teams.  These Care Teams include your primary Cardiologist (physician) and Advanced Practice Providers (APPs -  Physician Assistants and Nurse  Practitioners) who all work together to provide you with the care you need, when you need it.  Your next appointment:   6 month(s)  The format for your next appointment:  In Person  Provider:   Meriam Sprague, MD  Other Instructions You have been referred to [facility], their scheduler will call you to set this up.  Important Information About Sugar         Signed, Lesleigh Noe, MD  01/04/2022 11:36 AM    [facility]

## 2022-01-04 ENCOUNTER — Encounter: Payer: Self-pay | Admitting: Interventional Cardiology

## 2022-01-04 ENCOUNTER — Ambulatory Visit: Payer: BC Managed Care – PPO | Attending: Interventional Cardiology | Admitting: Interventional Cardiology

## 2022-01-04 VITALS — BP 128/78 | HR 93 | Ht 62.0 in | Wt 320.6 lb

## 2022-01-04 DIAGNOSIS — I10 Essential (primary) hypertension: Secondary | ICD-10-CM

## 2022-01-04 DIAGNOSIS — G4733 Obstructive sleep apnea (adult) (pediatric): Secondary | ICD-10-CM | POA: Diagnosis not present

## 2022-01-04 DIAGNOSIS — I5022 Chronic systolic (congestive) heart failure: Secondary | ICD-10-CM | POA: Diagnosis not present

## 2022-01-04 DIAGNOSIS — I428 Other cardiomyopathies: Secondary | ICD-10-CM

## 2022-01-04 MED ORDER — CARVEDILOL 25 MG PO TABS: ORAL_TABLET | ORAL | 3 refills | Status: DC

## 2022-01-04 MED ORDER — ENTRESTO 97-103 MG PO TABS: 1.0000 | ORAL_TABLET | Freq: Two times a day (BID) | ORAL | 11 refills | Status: DC

## 2022-01-04 MED ORDER — EMPAGLIFLOZIN 10 MG PO TABS: ORAL_TABLET | ORAL | 3 refills | Status: DC

## 2022-01-04 MED ORDER — SPIRONOLACTONE 25 MG PO TABS: 25.0000 mg | ORAL_TABLET | Freq: Every day | ORAL | 3 refills | Status: DC

## 2022-01-04 MED ORDER — FUROSEMIDE 80 MG PO TABS: 80.0000 mg | ORAL_TABLET | Freq: Every day | ORAL | 3 refills | Status: DC

## 2022-01-04 NOTE — Patient Instructions (Signed)
Medication Instructions:  Your physician recommends that you continue on your current medications as directed. Please refer to the Current Medication list given to you today.  *If you need a refill on your cardiac medications before your next appointment, please call your pharmacy*  Lab Work: NONE  Testing/Procedures: NONE  Follow-Up: At [facility], you and your health needs are our priority.  As part of our continuing mission to provide you with exceptional heart care, we have created designated Provider Care Teams.  These Care Teams include your primary Cardiologist (physician) and Advanced Practice Providers (APPs -  Physician Assistants and Nurse Practitioners) who all work together to provide you with the care you need, when you need it.  Your next appointment:   6 month(s)  The format for your next appointment:   In Person  Provider:   Freada Bergeron, MD  Other Instructions You have been referred to [facility], their scheduler will call you to set this up.  Important Information About Sugar

## 2022-01-06 ENCOUNTER — Ambulatory Visit: Payer: BC Managed Care – PPO | Admitting: Nurse Practitioner

## 2022-01-06 ENCOUNTER — Encounter: Payer: Self-pay | Admitting: Nurse Practitioner

## 2022-01-06 VITALS — BP 110/82 | HR 99 | Temp 98.0°F | Ht 62.0 in | Wt 323.4 lb

## 2022-01-06 DIAGNOSIS — J4540 Moderate persistent asthma, uncomplicated: Secondary | ICD-10-CM

## 2022-01-06 DIAGNOSIS — I110 Hypertensive heart disease with heart failure: Secondary | ICD-10-CM

## 2022-01-06 DIAGNOSIS — Z6841 Body Mass Index (BMI) 40.0 and over, adult: Secondary | ICD-10-CM

## 2022-01-06 DIAGNOSIS — K219 Gastro-esophageal reflux disease without esophagitis: Secondary | ICD-10-CM

## 2022-01-06 DIAGNOSIS — F418 Other specified anxiety disorders: Secondary | ICD-10-CM | POA: Diagnosis not present

## 2022-01-06 DIAGNOSIS — Z1159 Encounter for screening for other viral diseases: Secondary | ICD-10-CM

## 2022-01-06 DIAGNOSIS — R7309 Other abnormal glucose: Secondary | ICD-10-CM | POA: Diagnosis not present

## 2022-01-06 DIAGNOSIS — I10 Essential (primary) hypertension: Secondary | ICD-10-CM

## 2022-01-06 DIAGNOSIS — I5021 Acute systolic (congestive) heart failure: Secondary | ICD-10-CM | POA: Diagnosis not present

## 2022-01-06 DIAGNOSIS — R632 Polyphagia: Secondary | ICD-10-CM

## 2022-01-06 MED ORDER — LISDEXAMFETAMINE DIMESYLATE 20 MG PO CAPS: 20.0000 mg | ORAL_CAPSULE | Freq: Every day | ORAL | 0 refills | Status: DC

## 2022-01-06 MED ORDER — FLUTICASONE-SALMETEROL 232-14 MCG/ACT IN AEPB: 1.0000 | INHALATION_SPRAY | Freq: Two times a day (BID) | RESPIRATORY_TRACT | 5 refills | Status: DC

## 2022-01-06 MED ORDER — BUPROPION HCL ER (XL) 150 MG PO TB24: 450.0000 mg | ORAL_TABLET | Freq: Every day | ORAL | 2 refills | Status: DC

## 2022-01-06 MED ORDER — OMEPRAZOLE 40 MG PO CPDR: DELAYED_RELEASE_CAPSULE | ORAL | 1 refills | Status: DC

## 2022-01-06 MED ORDER — MONTELUKAST SODIUM 10 MG PO TABS: ORAL_TABLET | ORAL | 2 refills | Status: DC

## 2022-01-06 NOTE — Patient Instructions (Signed)
Hypertension, Adult High blood pressure (hypertension) is when the force of blood pumping through the arteries is too strong. The arteries are the blood vessels that carry blood from the heart throughout the body. Hypertension forces the heart to work harder to pump blood and may cause arteries to become narrow or stiff. Untreated or uncontrolled hypertension can lead to a heart attack, heart failure, a stroke, kidney disease, and other problems. A blood pressure reading consists of a higher number over a lower number. Ideally, your blood pressure should be below 120/80. The first ("top") number is called the systolic pressure. It is a measure of the pressure in your arteries as your heart beats. The second ("bottom") number is called the diastolic pressure. It is a measure of the pressure in your arteries as the heart relaxes. What are the causes? The exact cause of this condition is not known. There are some conditions that result in high blood pressure. What increases the risk? Certain factors may make you more likely to develop high blood pressure. Some of these risk factors are under your control, including: Smoking. Not getting enough exercise or physical activity. Being overweight. Having too much fat, sugar, calories, or salt (sodium) in your diet. Drinking too much alcohol. Other risk factors include: Having a personal history of heart disease, diabetes, high cholesterol, or kidney disease. Stress. Having a family history of high blood pressure and high cholesterol. Having obstructive sleep apnea. Age. The risk increases with age. What are the signs or symptoms? High blood pressure may not cause symptoms. Very high blood pressure (hypertensive crisis) may cause: Headache. Fast or irregular heartbeats (palpitations). Shortness of breath. Nosebleed. Nausea and vomiting. Vision changes. Severe chest pain, dizziness, and seizures. How is this diagnosed? This condition is diagnosed by  measuring your blood pressure while you are seated, with your arm resting on a flat surface, your legs uncrossed, and your feet flat on the floor. The cuff of the blood pressure monitor will be placed directly against the skin of your upper arm at the level of your heart. Blood pressure should be measured at least twice using the same arm. Certain conditions can cause a difference in blood pressure between your right and left arms. If you have a high blood pressure reading during one visit or you have normal blood pressure with other risk factors, you may be asked to: Return on a different day to have your blood pressure checked again. Monitor your blood pressure at home for 1 week or longer. If you are diagnosed with hypertension, you may have other blood or imaging tests to help your health care provider understand your overall risk for other conditions. How is this treated? This condition is treated by making healthy lifestyle changes, such as eating healthy foods, exercising more, and reducing your alcohol intake. You may be referred for counseling on a healthy diet and physical activity. Your health care provider may prescribe medicine if lifestyle changes are not enough to get your blood pressure under control and if: Your systolic blood pressure is above 130. Your diastolic blood pressure is above 80. Your personal target blood pressure may vary depending on your medical conditions, your age, and other factors. Follow these instructions at home: Eating and drinking  Eat a diet that is high in fiber and potassium, and low in sodium, added sugar, and fat. An example of this eating plan is called the DASH diet. DASH stands for Dietary Approaches to Stop Hypertension. To eat this way: Eat   plenty of fresh fruits and vegetables. Try to fill one half of your plate at each meal with fruits and vegetables. Eat whole grains, such as whole-wheat pasta, brown rice, or whole-grain bread. Fill about one  fourth of your plate with whole grains. Eat or drink low-fat dairy products, such as skim milk or low-fat yogurt. Avoid fatty cuts of meat, processed or cured meats, and poultry with skin. Fill about one fourth of your plate with lean proteins, such as fish, chicken without skin, beans, eggs, or tofu. Avoid pre-made and processed foods. These tend to be higher in sodium, added sugar, and fat. Reduce your daily sodium intake. Many people with hypertension should eat less than 1,500 mg of sodium a day. Do not drink alcohol if: Your health care provider tells you not to drink. You are pregnant, may be pregnant, or are planning to become pregnant. If you drink alcohol: Limit how much you have to: 0-1 drink a day for women. 0-2 drinks a day for men. Know how much alcohol is in your drink. In the [place] one drink equals one 12 oz bottle of beer (355 mL), one 5 oz glass of wine (148 mL), or one 1 oz glass of hard liquor (44 mL). Lifestyle  Work with your health care provider to maintain a healthy body weight or to lose weight. Ask what an ideal weight is for you. Get at least 30 minutes of exercise that causes your heart to beat faster (aerobic exercise) most days of the week. Activities may include walking, swimming, or biking. Include exercise to strengthen your muscles (resistance exercise), such as Pilates or lifting weights, as part of your weekly exercise routine. Try to do these types of exercises for 30 minutes at least 3 days a week. Do not use any products that contain nicotine or tobacco. These products include cigarettes, chewing tobacco, and vaping devices, such as e-cigarettes. If you need help quitting, ask your health care provider. Monitor your blood pressure at home as told by your health care provider. Keep all follow-up visits. This is important. Medicines Take over-the-counter and prescription medicines only as told by your health care provider. Follow directions carefully. Blood  pressure medicines must be taken as prescribed. Do not skip doses of blood pressure medicine. Doing this puts you at risk for problems and can make the medicine less effective. Ask your health care provider about side effects or reactions to medicines that you should watch for. Contact a health care provider if you: Think you are having a reaction to a medicine you are taking. Have headaches that keep coming back (recurring). Feel dizzy. Have swelling in your ankles. Have trouble with your vision. Get help right away if you: Develop a severe headache or confusion. Have unusual weakness or numbness. Feel faint. Have severe pain in your chest or abdomen. Vomit repeatedly. Have trouble breathing. These symptoms may be an emergency. Get help right away. Call 911. Do not wait to see if the symptoms will go away. Do not drive yourself to the hospital. Summary Hypertension is when the force of blood pumping through your arteries is too strong. If this condition is not controlled, it may put you at risk for serious complications. Your personal target blood pressure may vary depending on your medical conditions, your age, and other factors. For most people, a normal blood pressure is less than 120/80. Hypertension is treated with lifestyle changes, medicines, or a combination of both. Lifestyle changes include losing weight, eating a healthy,   low-sodium diet, exercising more, and limiting alcohol. This information is not intended to replace advice given to you by your health care provider. Make sure you discuss any questions you have with your health care provider. Document Revised: 01/20/2021 Document Reviewed: 01/20/2021 Elsevier Patient Education  2023 Elsevier Inc.  

## 2022-01-06 NOTE — Progress Notes (Signed)
I,[name],acting as a Education administrator for [employer], FNP.,have documented all relevant documentation on the behalf of [name], FNP,as directed by  [name], FNP while in the presence of [name], [place].  Subjective:     Patient ID: Samantha Romero , female    DOB: [birth year] , 46 y.o.   MRN: [id number]   Chief Complaint  Patient presents with   Hypertension    HPI  Patient presents today for a blood pressure f/u.   She had lost her insurance for a while but was being treated for CHF. She is here today due to concerns about ADHD. She feels like she is going in circles. She has had the conversation with her therapist in the past. Her depression has improved but the concentration has not. Psychiatrist was concerned about her heart and her cardiologist feels is okay to take medications. She can not been seen until January at the psychiatrist. She is having difficulty with her job. 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   She has aching when she exercises. She reports a feeling of being overwhelmed. She has been to aquatic center in the past for her back. She has had a Lap Band. She will not eat at right times and will sometimes have snacks all day. She feels like she has a problem with binge eating or mindless eating.   She is [facility] by [facility] issues.  Hypertension This is a chronic problem. The current episode started more than 1 year ago. The problem is unchanged. The problem is controlled. Pertinent negatives include no anxiety or blurred vision. There are no associated agents to hypertension. Risk factors for coronary artery disease include obesity and sedentary lifestyle. Past treatments include angiotensin blockers. There are no compliance problems.  There is no history of angina. There is no history of chronic renal disease.  Diabetes She presents for her follow-up diabetic visit. Diabetes type: abnormal glucose. There are no  hypoglycemic associated symptoms. Pertinent negatives for diabetes include no blurred vision. There are no hypoglycemic complications. There are no diabetic complications. Risk factors for coronary artery disease include obesity and sedentary lifestyle. When asked about current treatments, none were reported.     Past Medical History:  Diagnosis Date   Anxiety    panic attacks   Asthma    CHF (congestive heart failure), NYHA class II, acute, systolic ([facility]) 4/96/7591   Depression    Fibromyalgia    Hypertension    no meds currently   No pertinent past medical history    Pneumonia    hx of    Sinus problem      Family History  Problem Relation Age of Onset   Ulcers Mother    Hypertension Mother    Cancer Maternal Uncle        lung   Cancer Maternal Grandfather        lung and back     Current Outpatient Medications:    albuterol (PROVENTIL) (2.5 MG/3ML) 0.083% nebulizer solution, Take 3 mLs (2.5 mg total) by nebulization every 6 (six) hours as needed for wheezing or shortness of breath., Disp: 150 mL, Rfl: 1   albuterol (VENTOLIN HFA) 108 (90 Base) MCG/ACT inhaler, Inhale 2 puffs into the lungs every 6 (six) hours as needed for wheezing or shortness of breath., Disp: 8 g, Rfl: 2   ALPRAZolam (XANAX) 1 MG tablet, TAKE 1 TABLET BY MOUTH 2 TIMES DAILY AS NEEDED FOR ANXIETY.,  Disp: 40 tablet, Rfl: 2   azelastine (ASTELIN) 0.1 % nasal spray, Place 2 sprays into both nostrils 2 (two) times daily., Disp: 30 mL, Rfl: 5   carvedilol (COREG) 25 MG tablet, TAKE 1 TABLET (25 MG TOTAL) BY MOUTH TWICE A DAY WITH MEALS, Disp: 180 tablet, Rfl: 3   DULoxetine (CYMBALTA) 60 MG capsule, TAKE 1 CAPSULE BY MOUTH EVERY DAY, Disp: 90 capsule, Rfl: 1   empagliflozin (JARDIANCE) 10 MG TABS tablet, TAKE ONE TABLET BY MOUTH DAILY BEFORE BREAKFAST, Disp: 90 tablet, Rfl: 3   furosemide (LASIX) 80 MG tablet, Take 1 tablet (80 mg total) by mouth daily., Disp: 90 tablet, Rfl: 3   levocetirizine (XYZAL) 5 MG  tablet, TAKE 1 TABLET BY MOUTH EVERY DAY IN THE EVENING, Disp: 90 tablet, Rfl: 0   levonorgestrel (MIRENA, 52 MG,) 20 MCG/DAY IUD, , Disp: , Rfl:    lisdexamfetamine (VYVANSE) 20 MG capsule, Take 1 capsule (20 mg total) by mouth daily., Disp: 30 capsule, Rfl: 0   meloxicam (MOBIC) 15 MG tablet, TAKE 1 TABLET BY MOUTH EVERY DAY AS NEEDED FOR PAIN, Disp: 30 tablet, Rfl: 0   sacubitril-valsartan (ENTRESTO) 97-103 MG, Take 1 tablet by mouth 2 (two) times daily., Disp: 60 tablet, Rfl: 11   Semaglutide,0.25 or 0.5MG /DOS, (OZEMPIC, 0.25 OR 0.5 MG/DOSE,) 2 MG/1.5ML SOPN, Inject 0.5 mg into the skin once a week. (Patient taking differently: Inject 0.5 mg into the skin every Thursday.), Disp: 4.5 mL, Rfl: 1   spironolactone (ALDACTONE) 25 MG tablet, Take 1 tablet (25 mg total) by mouth daily., Disp: 90 tablet, Rfl: 3   traZODone (DESYREL) 50 MG tablet, TAKE 1 TABLET BY MOUTH EVERYDAY AT BEDTIME (Patient taking differently: Take 50 mg by mouth as needed for sleep.), Disp: 30 tablet, Rfl: 2   buPROPion (WELLBUTRIN XL) 150 MG 24 hr tablet, Take 3 tablets (450 mg total) by mouth daily., Disp: 270 tablet, Rfl: 2   Fluticasone-Salmeterol 232-14 MCG/ACT AEPB, Inhale 1 puff into the lungs in the morning and at bedtime., Disp: 1 each, Rfl: 5   montelukast (SINGULAIR) 10 MG tablet, TAKE 1 TABLET BY MOUTH EVERYDAY AT BEDTIME, Disp: 90 tablet, Rfl: 2   omeprazole (PRILOSEC) 40 MG capsule, TAKE 1 CAPSULE BY MOUTH EVERY DAY, Disp: 90 capsule, Rfl: 1   Allergies  Allergen Reactions   Grass Extracts [Gramineae Pollens]     Nasal congestion, post nasal drip    Shellfish Allergy Itching    In throat only.     Review of Systems  Constitutional: Negative.   HENT: Negative.    Eyes: Negative.  Negative for blurred vision.  Respiratory: Negative.    Cardiovascular: Negative.   Gastrointestinal: Negative.   Endocrine: Negative.   Genitourinary: Negative.   Musculoskeletal: Negative.   Skin: Negative.    Allergic/Immunologic: Negative.   Neurological: Negative.   Hematological: Negative.   Psychiatric/Behavioral: Negative.       Today's Vitals   01/06/22 1209  BP: 110/82  Pulse: 99  Temp: 98 F (36.7 C)  TempSrc: Oral  Weight: (!) 323 lb 6.4 oz (146.7 kg)  Height: 5\' 2"  (1.575 m)   Body mass index is 59.15 kg/m.   Objective:  Physical Exam Vitals reviewed.  Constitutional:      General: She is not in acute distress.    Appearance: Normal appearance. She is obese.  Cardiovascular:     Rate and Rhythm: Normal rate and regular rhythm.     Pulses: Normal pulses.  Heart sounds: Normal heart sounds. No murmur heard. Pulmonary:     Effort: Pulmonary effort is normal. No respiratory distress.     Breath sounds: Normal breath sounds. No wheezing.  Musculoskeletal:     Right lower leg: No edema.     Left lower leg: No edema.  Skin:    General: Skin is warm and dry.     Capillary Refill: Capillary refill takes less than 2 seconds.  Neurological:     General: No focal deficit present.     Mental Status: She is alert and oriented to person, place, and time.     Cranial Nerves: No cranial nerve deficit.     Motor: No weakness.  Psychiatric:        Mood and Affect: Mood normal.        Behavior: Behavior normal.        Thought Content: Thought content normal.        Judgment: Judgment normal.         Assessment And Plan:     1. Essential hypertension Comments: Blood pressure is controlled, continue current medications and f/u with Cardiology - CMP14+EGFR - Amb Referral To Provider Referral Exercise Program (P.R.E.P)  2. Abnormal glucose Comments: HgbA1c is stable, continue focusing on diet low in sugar and starches. Exercise as tolerated up to 5 days a week at least.  - Hemoglobin A1c  3. CHF (congestive heart failure), NYHA class II, acute, systolic ([facility]) Comments: Continue f/u with Cardiology and will refer to PREP to assist with exercising more - CMP14+EGFR -  Amb Referral To Provider Referral Exercise Program (P.R.E.P)  4. Depression with anxiety Comments: She is currently on Wellbutrin. She is due to see psychiatry in January.  - buPROPion (WELLBUTRIN XL) 150 MG 24 hr tablet; Take 3 tablets (450 mg total) by mouth daily.  Dispense: 270 tablet; Refill: 2 - Microalbumin / Creatinine Urine Ratio  5. Moderate persistent asthma without complication - montelukast (SINGULAIR) 10 MG tablet; TAKE 1 TABLET BY MOUTH EVERYDAY AT BEDTIME  Dispense: 90 tablet; Refill: 2  6. Binge eating Comments: Will add Vyvanse as their are studies indicating this helps with Binge eating.  - lisdexamfetamine (VYVANSE) 20 MG capsule; Take 1 capsule (20 mg total) by mouth daily.  Dispense: 30 capsule; Refill: 0  7. Gastroesophageal reflux disease without esophagitis Comments: Continue f/u with GI - omeprazole (PRILOSEC) 40 MG capsule; TAKE 1 CAPSULE BY MOUTH EVERY DAY  Dispense: 90 capsule; Refill: 1  8. Encounter for hepatitis C screening test for low risk patient Will check Hepatitis C screening due to recent recommendations to screen all adults 18 years and older - Hepatitis C antibody  9. Class 3 severe obesity due to excess calories with serious comorbidity and body mass index (BMI) of 50.0 to 59.9 in adult [facility]) Chronic Discussed healthy diet and regular exercise options  Encouraged to exercise at least 150 minutes per week with 2 days of strength training - Amb Referral To Provider Referral Exercise Program (P.R.E.P)   I personally spent 35 minutes face-to-face and non-face-to-face in the care of this patient, which includes all pre-, intra-, and post visit time on the date of service.   Patient was given opportunity to ask questions. Patient verbalized understanding of the plan and was able to repeat key elements of the plan. All questions were answered to their satisfaction.  [name], FNP   I, [name], FNP, have reviewed all documentation for this  visit. The documentation on  01/06/22 for the exam, diagnosis, procedures, and orders are all accurate and complete.   IF YOU HAVE BEEN REFERRED TO A SPECIALIST, IT MAY TAKE 1-2 WEEKS TO SCHEDULE/PROCESS THE REFERRAL. IF YOU HAVE NOT HEARD FROM US/SPECIALIST IN TWO WEEKS, PLEASE GIVE [place] A CALL AT [phone] X [phone].   THE PATIENT IS ENCOURAGED TO PRACTICE SOCIAL DISTANCING DUE TO THE COVID-19 PANDEMIC.

## 2022-01-07 LAB — CMP14+EGFR
ALT: 22 IU/L (ref 0–32)
AST: 22 IU/L (ref 0–40)
Albumin/Globulin Ratio: 1.7 (ref 1.2–2.2)
Albumin: 4.4 g/dL (ref 3.9–4.9)
Alkaline Phosphatase: 87 IU/L (ref 44–121)
BUN/Creatinine Ratio: 10 (ref 9–23)
BUN: 10 mg/dL (ref 6–24)
Bilirubin Total: 0.3 mg/dL (ref 0.0–1.2)
CO2: 21 mmol/L (ref 20–29)
Calcium: 10 mg/dL (ref 8.7–10.2)
Chloride: 106 mmol/L (ref 96–106)
Creatinine, Ser: 1 mg/dL (ref 0.57–1.00)
Globulin, Total: 2.6 g/dL (ref 1.5–4.5)
Glucose: 126 mg/dL — ABNORMAL HIGH (ref 70–99)
Potassium: 4.4 mmol/L (ref 3.5–5.2)
Sodium: 140 mmol/L (ref 134–144)
Total Protein: 7 g/dL (ref 6.0–8.5)
eGFR: 71 mL/min/{1.73_m2} (ref >59)

## 2022-01-07 LAB — HEMOGLOBIN A1C
Est. average glucose Bld gHb Est-mCnc: 123 mg/dL
Hgb A1c MFr Bld: 5.9 % — ABNORMAL HIGH (ref 4.8–5.6)

## 2022-01-07 LAB — HEPATITIS C ANTIBODY: Hep C Virus Ab: NONREACTIVE

## 2022-01-07 LAB — MICROALBUMIN / CREATININE URINE RATIO
Creatinine, Urine: 36.4 mg/dL
Microalb/Creat Ratio: <8 mg/g creat (ref 0–29)
Microalbumin, Urine: <3 ug/mL

## 2022-01-13 ENCOUNTER — Encounter: Payer: Self-pay | Admitting: Interventional Cardiology

## 2022-01-15 ENCOUNTER — Telehealth: Payer: Self-pay

## 2022-01-15 NOTE — Telephone Encounter (Signed)
VMT pt requesting call back to discuss referral to PREP 

## 2022-01-29 ENCOUNTER — Telehealth: Payer: Self-pay

## 2022-01-29 NOTE — Telephone Encounter (Signed)
VMT pt requesting call back reference PREP class starting on 02/08/22 at [facility]

## 2022-02-17 ENCOUNTER — Ambulatory Visit: Payer: BC Managed Care – PPO | Admitting: Nurse Practitioner

## 2022-02-23 ENCOUNTER — Telehealth (HOSPITAL_COMMUNITY): Payer: Self-pay

## 2022-02-23 NOTE — Telephone Encounter (Signed)
Pt insurance is active and benefits verified through [place]. Co-pay $0.00, DED $500.00/$192.12 met, out of pocket $1,500.00/$550.32 met, co-insurance 20%. No pre-authorization required. Sam/[employer], 02/23/22 @ 4:19PM, DXI#[id number]   How many CR sessions are covered? (36 sessions for TCR, 72 sessions for ICR)72 Is this a lifetime maximum or an annual maximum? Lifetime Has the member used any of these services to date? No Is there a time limit (weeks/months) on start of program and/or program completion? No

## 2022-02-24 ENCOUNTER — Telehealth (HOSPITAL_COMMUNITY): Payer: Self-pay | Admitting: *Deleted

## 2022-02-24 NOTE — Telephone Encounter (Signed)
Left message to call cardiac rehab about upcoming orientation appointment.Thayer Headings RN BSN

## 2022-02-25 ENCOUNTER — Encounter (HOSPITAL_COMMUNITY)
Admission: RE | Admit: 2022-02-25 | Discharge: 2022-02-25 | Disposition: A | Discharge status: Home / Self Care | Payer: BC Managed Care – PPO | Source: Ambulatory Visit | Attending: Interventional Cardiology | Admitting: Interventional Cardiology

## 2022-02-25 ENCOUNTER — Encounter (HOSPITAL_COMMUNITY): Payer: Self-pay

## 2022-02-25 VITALS — BP 118/72 | HR 90 | Ht 62.0 in | Wt 323.4 lb

## 2022-02-25 DIAGNOSIS — I5022 Chronic systolic (congestive) heart failure: Secondary | ICD-10-CM | POA: Diagnosis not present

## 2022-02-25 NOTE — Progress Notes (Signed)
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 proper form for all exercise.  Medical   Aging: Enhancing Your Quality of Life  Clinical staff conducted group or individual video education with verbal and written material and guidebook.  Patient learns key strategies and recommendations to stay in good physical health and enhance quality of life, such as prevention strategies, having an advocate, securing a Health Care Proxy and Power of Attorney, and keeping a list of medications and system for tracking them. It also discusses how to avoid risk for bone loss.  Biology of Weight Control  Clinical staff conducted group or individual video education with verbal and written material and guidebook.  Patient learns that weight gain occurs because we consume more calories than we burn (eating more, moving less). Even if your body  weight is normal, you may have higher ratios of fat compared to muscle mass. Too much body fat puts you at increased risk for cardiovascular disease, heart attack, stroke, type 2 diabetes, and obesity-related cancers. In addition to exercise, following the Pritikin Eating Plan can help reduce your risk.  Decoding Lab Results  Clinical staff conducted group or individual video education with verbal and written material and guidebook.  Patient learns that lab test reflects one measurement whose values change over time and are influenced by many factors, including medication, stress, sleep, exercise, food, hydration, pre-existing medical conditions, and more. It is recommended to use the knowledge from this video to become more involved with your lab results and evaluate your numbers to speak with your doctor.   Diseases of Our Time - Overview  Clinical staff conducted group or individual video education with verbal and written material and guidebook.  Patient learns that according to the CDC, 50% to 70% of chronic diseases (such as obesity, type 2 diabetes, elevated lipids, hypertension, and heart disease) are avoidable through lifestyle improvements including healthier food choices, listening to satiety cues, and increased physical activity.  Sleep Disorders Clinical staff conducted group or individual video education with verbal and written material and guidebook.  Patient learns how good quality and duration of sleep are important to overall health and well-being. Patient also learns about sleep disorders and how they impact health along with recommendations to address them, including discussing with a physician.  Nutrition  Dining Out - Part 2 Clinical staff conducted group or individual video education with verbal and written material and guidebook.  Patient learns how to plan ahead and communicate in order to maximize their dining experience in a healthy and nutritious manner. Included are  recommended food choices based on the type of restaurant the patient is visiting.   Fueling a Banker conducted group or individual video education with verbal and written material and guidebook.  There is a strong connection between our food choices and our health. Diseases like obesity and type 2 diabetes are very prevalent and are in large-part due to lifestyle choices. The Pritikin Eating Plan provides plenty of food and hunger-curbing satisfaction. It is easy to follow, affordable, and helps reduce health risks.  Menu Workshop  Clinical staff conducted group  or individual video education with verbal and written material and guidebook.  Patient learns that restaurant meals can sabotage health goals because they are often packed with calories, fat, sodium, and sugar. Recommendations include strategies to plan ahead and to communicate with the manager, chef, or server to help order a healthier meal.  Planning Your Eating Strategy  Clinical staff conducted group or individual video education with verbal and written material and guidebook.  Patient learns about the [place] and its benefit of reducing the risk of disease. The [place] does not focus on calories. Instead, it emphasizes high-quality, nutrient-rich foods. By knowing the characteristics of the foods, we choose, we can determine their calorie density and make informed decisions.  Targeting Your Nutrition Priorities  Clinical staff conducted group or individual video education with verbal and written material and guidebook.  Patient learns that lifestyle habits have a tremendous impact on disease risk and progression. This video provides eating and physical activity recommendations based on your personal health goals, such as reducing LDL cholesterol, losing weight, preventing or controlling type 2 diabetes, and reducing high blood pressure.  Vitamins and Minerals  Clinical staff conducted  group or individual video education with verbal and written material and guidebook.  Patient learns different ways to obtain key vitamins and minerals, including through a recommended healthy diet. It is important to discuss all supplements you take with your doctor.   Healthy Mind-Set    Smoking Cessation  Clinical staff conducted group or individual video education with verbal and written material and guidebook.  Patient learns that cigarette smoking and tobacco addiction pose a serious health risk which affects millions of people. Stopping smoking will significantly reduce the risk of heart disease, lung disease, and many forms of cancer. Recommended strategies for quitting are covered, including working with your doctor to develop a successful plan.  Culinary   Becoming a Financial trader conducted group or individual video education with verbal and written material and guidebook.  Patient learns that cooking at home can be healthy, cost-effective, quick, and puts them in control. Keys to cooking healthy recipes will include looking at your recipe, assessing your equipment needs, planning ahead, making it simple, choosing cost-effective seasonal ingredients, and limiting the use of added fats, salts, and sugars.  Cooking - Breakfast and Snacks  Clinical staff conducted group or individual video education with verbal and written material and guidebook.  Patient learns how important breakfast is to satiety and nutrition through the entire day. Recommendations include key foods to eat during breakfast to help stabilize blood sugar levels and to prevent overeating at meals later in the day. Planning ahead is also a key component.  Cooking - Human resources officer conducted group or individual video education with verbal and written material and guidebook.  Patient learns eating strategies to improve overall health, including an approach to cook more at home. Recommendations  include thinking of animal protein as a side on your plate rather than center stage and focusing instead on lower calorie dense options like vegetables, fruits, whole grains, and plant-based proteins, such as beans. Making sauces in large quantities to freeze for later and leaving the skin on your vegetables are also recommended to maximize your experience.  Cooking - Healthy Salads and Dressing Clinical staff conducted group or individual video education with verbal and written material and guidebook.  Patient learns that vegetables, fruits, whole grains, and legumes are the foundations of the Pritikin Eating  Plan. Recommendations include how to incorporate each of these in flavorful and healthy salads, and how to create homemade salad dressings. Proper handling of ingredients is also covered. Cooking - Soups and [employer] - Soups and Desserts Clinical staff conducted group or individual video education with verbal and written material and guidebook.  Patient learns that Pritikin soups and desserts make for easy, nutritious, and delicious snacks and meal components that are low in sodium, fat, sugar, and calorie density, while high in vitamins, minerals, and filling fiber. Recommendations include simple and healthy ideas for soups and desserts.   Overview     The Pritikin Solution Program Overview Clinical staff conducted group or individual video education with verbal and written material and guidebook.  Patient learns that the results of the [place] Program have been documented in more than 100 articles published in peer-reviewed journals, and the benefits include reducing risk factors for (and, in some cases, even reversing) high cholesterol, high blood pressure, type 2 diabetes, obesity, and more! An overview of the three key pillars of the Pritikin Program will be covered: eating well, doing regular exercise, and having a healthy mind-set.  WORKSHOPS  Exercise: Exercise Basics:  Building Your Action Plan Clinical staff led group instruction and group discussion with PowerPoint presentation and patient guidebook. To enhance the learning environment the use of posters, models and videos may be added. At the conclusion of this workshop, patients will comprehend the difference between physical activity and exercise, as well as the benefits of incorporating both, into their routine. Patients will understand the FITT (Frequency, Intensity, Time, and Type) principle and how to use it to build an exercise action plan. In addition, safety concerns and other considerations for exercise and cardiac rehab will be addressed by the presenter. The purpose of this lesson is to promote a comprehensive and effective weekly exercise routine in order to improve patients' overall level of fitness.   Managing Heart Disease: Your Path to a Healthier Heart Clinical staff led group instruction and group discussion with PowerPoint presentation and patient guidebook. To enhance the learning environment the use of posters, models and videos may be added.At the conclusion of this workshop, patients will understand the anatomy and physiology of the heart. Additionally, they will understand how Pritikin's three pillars impact the risk factors, the progression, and the management of heart disease.  The purpose of this lesson is to provide a high-level overview of the heart, heart disease, and how the Pritikin lifestyle positively impacts risk factors.  Exercise Biomechanics Clinical staff led group instruction and group discussion with PowerPoint presentation and patient guidebook. To enhance the learning environment the use of posters, models and videos may be added. Patients will learn how the structural parts of their bodies function and how these functions impact their daily activities, movement, and exercise. Patients will learn how to promote a neutral spine, learn how to manage pain, and identify  ways to improve their physical movement in order to promote healthy living. The purpose of this lesson is to expose patients to common physical limitations that impact physical activity. Participants will learn practical ways to adapt and manage aches and pains, and to minimize their effect on regular exercise. Patients will learn how to maintain good posture while sitting, walking, and lifting.  Balance Training and Fall Prevention  Clinical staff led group instruction and group discussion with PowerPoint presentation and patient guidebook. To enhance the learning environment the use of posters, models and videos may be added. At  the conclusion of this workshop, patients will understand the importance of their sensorimotor skills (vision, proprioception, and the vestibular system) in maintaining their ability to balance as they age. Patients will apply a variety of balancing exercises that are appropriate for their current level of function. Patients will understand the common causes for poor balance, possible solutions to these problems, and ways to modify their physical environment in order to minimize their fall risk. The purpose of this lesson is to teach patients about the importance of maintaining balance as they age and ways to minimize their risk of falling.  WORKSHOPS   Nutrition:  Fueling a Scientist, research (physical sciences) led group instruction and group discussion with PowerPoint presentation and patient guidebook. To enhance the learning environment the use of posters, models and videos may be added. Patients will review the foundational principles of the [place] and understand what constitutes a serving size in each of the food groups. Patients will also learn Pritikin-friendly foods that are better choices when away from home and review make-ahead meal and snack options. Calorie density will be reviewed and applied to three nutrition priorities: weight maintenance, weight  loss, and weight gain. The purpose of this lesson is to reinforce (in a group setting) the key concepts around what patients are recommended to eat and how to apply these guidelines when away from home by planning and selecting Pritikin-friendly options. Patients will understand how calorie density may be adjusted for different weight management goals.  Mindful Eating  Clinical staff led group instruction and group discussion with PowerPoint presentation and patient guidebook. To enhance the learning environment the use of posters, models and videos may be added. Patients will briefly review the concepts of the [place] and the importance of low-calorie dense foods. The concept of mindful eating will be introduced as well as the importance of paying attention to internal hunger signals. Triggers for non-hunger eating and techniques for dealing with triggers will be explored. The purpose of this lesson is to provide patients with the opportunity to review the basic principles of the [place], discuss the value of eating mindfully and how to measure internal cues of hunger and fullness using the Hunger Scale. Patients will also discuss reasons for non-hunger eating and learn strategies to use for controlling emotional eating.  Targeting Your Nutrition Priorities Clinical staff led group instruction and group discussion with PowerPoint presentation and patient guidebook. To enhance the learning environment the use of posters, models and videos may be added. Patients will learn how to determine their genetic susceptibility to disease by reviewing their family history. Patients will gain insight into the importance of diet as part of an overall healthy lifestyle in mitigating the impact of genetics and other environmental insults. The purpose of this lesson is to provide patients with the opportunity to assess their personal nutrition priorities by looking at their family history, their own  health history and current risk factors. Patients will also be able to discuss ways of prioritizing and modifying the [place] for their highest risk areas  Menu  Clinical staff led group instruction and group discussion with PowerPoint presentation and patient guidebook. To enhance the learning environment the use of posters, models and videos may be added. Using menus brought in from [employer], or printed from [employer], patients will apply the [place] dining out guidelines that were presented in the [employer] video. Patients will also be able to practice these guidelines in  a variety of provided scenarios. The purpose of this lesson is to provide patients with the opportunity to practice hands-on learning of the [place] with actual menus and practice scenarios.  Label Reading Clinical staff led group instruction and group discussion with PowerPoint presentation and patient guidebook. To enhance the learning environment the use of posters, models and videos may be added. Patients will review and discuss the Pritikin label reading guidelines presented in Pritikin's Label Reading Educational series video. Using fool labels brought in from local grocery stores and markets, patients will apply the label reading guidelines and determine if the packaged food meet the Pritikin guidelines. The purpose of this lesson is to provide patients with the opportunity to review, discuss, and practice hands-on learning of the Pritikin Label Reading guidelines with actual packaged food labels. [place] Workshops are designed to teach patients ways to prepare quick, simple, and affordable recipes at home. The importance of nutrition's role in chronic disease risk reduction is reflected in its emphasis in the overall Pritikin program. By learning how to prepare essential core Pritikin Eating Plan recipes, patients will  increase control over what they eat; be able to customize the flavor of foods without the use of added salt, sugar, or fat; and improve the quality of the food they consume. By learning a set of core recipes which are easily assembled, quickly prepared, and affordable, patients are more likely to prepare more healthy foods at home. These workshops focus on convenient breakfasts, simple entres, side dishes, and desserts which can be prepared with minimal effort and are consistent with nutrition recommendations for cardiovascular risk reduction. Cooking [employer] are taught by a Engineer, materials (RD) who has been trained by the [employer]. The chef or RD has a clear understanding of the importance of minimizing - if not completely eliminating - added fat, sugar, and sodium in recipes. Throughout the series of [place] Workshop sessions, patients will learn about healthy ingredients and efficient methods of cooking to build confidence in their capability to prepare    Cooking School weekly topics:  Adding Flavor- Sodium-Free  Fast and Healthy Breakfasts  Powerhouse Plant-Based Proteins  Satisfying Salads and Dressings  Simple Sides and Sauces  International Cuisine-Spotlight on the [place] Zones  Delicious Desserts  Savory Soups  Efficiency Cooking - Meals in a Snap  Tasty Appetizers and Snacks  Comforting Weekend Breakfasts  One-Pot Wonders   Fast Evening Meals  Easy [place] (Psychosocial): New Thoughts, New Behaviors Clinical staff led group instruction and group discussion with PowerPoint presentation and patient guidebook. To enhance the learning environment the use of posters, models and videos may be added. Patients will learn and practice techniques for developing effective health and lifestyle goals. Patients will be able to effectively apply the goal setting process learned to  develop at least one new personal goal.  The purpose of this lesson is to expose patients to a new skill set of behavior modification techniques such as techniques setting SMART goals, overcoming barriers, and achieving new thoughts and new behaviors.  Managing Moods and Relationships Clinical staff led group instruction and group discussion with PowerPoint presentation and patient guidebook. To enhance the learning environment the use of posters, models and videos may be added. Patients will learn how emotional and chronic stress factors can impact their health and relationships. They will learn healthy ways to manage their  moods and utilize positive coping mechanisms. In addition, ICR patients will learn ways to improve communication skills. The purpose of this lesson is to expose patients to ways of understanding how one's mood and health are intimately connected. Developing a healthy outlook can help build positive relationships and connections with others. Patients will understand the importance of utilizing effective communication skills that include actively listening and being heard. They will learn and understand the importance of the "4 Cs" and especially Connections in fostering of a Healthy Mind-Set.  Healthy Sleep for a Healthy Heart Clinical staff led group instruction and group discussion with PowerPoint presentation and patient guidebook. To enhance the learning environment the use of posters, models and videos may be added. At the conclusion of this workshop, patients will be able to demonstrate knowledge of the importance of sleep to overall health, well-being, and quality of life. They will understand the symptoms of, and treatments for, common sleep disorders. Patients will also be able to identify daytime and nighttime behaviors which impact sleep, and they will be able to apply these tools to help manage sleep-related challenges. The purpose of this lesson is to provide patients with a  general overview of sleep and outline the importance of quality sleep. Patients will learn about a few of the most common sleep disorders. Patients will also be introduced to the concept of "sleep hygiene," and discover ways to self-manage certain sleeping problems through simple daily behavior changes. Finally, the workshop will motivate patients by clarifying the links between quality sleep and their goals of heart-healthy living.   Recognizing and Reducing Stress Clinical staff led group instruction and group discussion with PowerPoint presentation and patient guidebook. To enhance the learning environment the use of posters, models and videos may be added. At the conclusion of this workshop, patients will be able to understand the types of stress reactions, differentiate between acute and chronic stress, and recognize the impact that chronic stress has on their health. They will also be able to apply different coping mechanisms, such as reframing negative self-talk. Patients will have the opportunity to practice a variety of stress management techniques, such as deep abdominal breathing, progressive muscle relaxation, and/or guided imagery.  The purpose of this lesson is to educate patients on the role of stress in their lives and to provide healthy techniques for coping with it.  Learning Barriers/Preferences:  Learning Barriers/Preferences - 02/25/22 1256       Learning Barriers/Preferences   Learning Barriers Sight   pt wears glasses   Learning Preferences Computer/Internet;Group Instruction;Individual Instruction;Pictoral;Skilled Demonstration;Video;Written Material             Education Topics:  Knowledge Questionnaire Score:  Knowledge Questionnaire Score - 02/25/22 1316       Knowledge Questionnaire Score   Pre Score 21/24             Core Components/Risk Factors/Patient Goals at Admission:  Personal Goals and Risk Factors at Admission - 02/25/22 1256       Core  Components/Risk Factors/Patient Goals on Admission    Weight Management Yes;Obesity;Weight Loss    Intervention Weight Management: Develop a combined nutrition and exercise program designed to reach desired caloric intake, while maintaining appropriate intake of nutrient and fiber, sodium and fats, and appropriate energy expenditure required for the weight goal.;Weight Management: Provide education and appropriate resources to help participant work on and attain dietary goals.;Weight Management/Obesity: Establish reasonable short term and long term weight goals.;Obesity: Provide education and appropriate resources to help participant work  on and attain dietary goals.    Admit Weight 323 lb 6.6 oz (146.7 kg)    Expected Outcomes Long Term: Adherence to nutrition and physical activity/exercise program aimed toward attainment of established weight goal;Short Term: Continue to assess and modify interventions until short term weight is achieved;Weight Loss: Understanding of general recommendations for a balanced deficit meal plan, which promotes 1-2 lb weight loss per week and includes a negative energy balance of [phone] kcal/d;Understanding recommendations for meals to include 15-35% energy as protein, 25-35% energy from fat, 35-60% energy from carbohydrates, less than 200mg  of dietary cholesterol, 20-35 gm of total fiber daily;Understanding of distribution of calorie intake throughout the day with the consumption of 4-5 meals/snacks    Heart Failure Yes    Intervention Provide a combined exercise and nutrition program that is supplemented with education, support and counseling about heart failure. Directed toward relieving symptoms such as shortness of breath, decreased exercise tolerance, and extremity edema.    Expected Outcomes Improve functional capacity of life;Short term: Attendance in program 2-3 days a week with increased exercise capacity. Reported lower sodium intake. Reported increased fruit and  vegetable intake. Reports medication compliance.;Short term: Daily weights obtained and reported for increase. Utilizing diuretic protocols set by physician.;Long term: Adoption of self-care skills and reduction of barriers for early signs and symptoms recognition and intervention leading to self-care maintenance.    Hypertension Yes    Intervention Provide education on lifestyle modifcations including regular physical activity/exercise, weight management, moderate sodium restriction and increased consumption of fresh fruit, vegetables, and low fat dairy, alcohol moderation, and smoking cessation.;Monitor prescription use compliance.    Expected Outcomes Short Term: Continued assessment and intervention until BP is < 140/35mm HG in hypertensive participants. < 130/67mm HG in hypertensive participants with diabetes, heart failure or chronic kidney disease.;Long Term: Maintenance of blood pressure at goal levels.    Stress Yes    Intervention Offer individual and/or small group education and counseling on adjustment to heart disease, stress management and health-related lifestyle change. Teach and support self-help strategies.;Refer participants experiencing significant psychosocial distress to appropriate mental health specialists for further evaluation and treatment. When possible, include family members and significant others in education/counseling sessions.    Expected Outcomes Short Term: Participant demonstrates changes in health-related behavior, relaxation and other stress management skills, ability to obtain effective social support, and compliance with psychotropic medications if prescribed.;Long Term: Emotional wellbeing is indicated by absence of clinically significant psychosocial distress or social isolation.             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 Introduction to [facility]- intitial orientation packet reviewed with patient.                Comments:  Participant attended orientation for the  intensive cardiac rehabilitation program on  02/25/2022  to perform initial intake and exercise walk test. Patient introduced to the [facility] education and orientation packet was reviewed. Completed 6-minute walk test, measurements, initial ITP, and exercise prescription. Vital signs stable. Telemetry-normal sinus tach, asymptomatic. Pt reported chronic L hip pain at 7/10 during 02/27/2022.   Service time was from 0851 to 1120.

## 2022-02-25 NOTE — Telephone Encounter (Signed)
Per Fredric Mare EP pt would like to switch from the 10:15am cardiac rehab sessions to the 8:15am.

## 2022-02-26 NOTE — Progress Notes (Signed)
Cardiac Rehab Medication Review   Does the patient  feel that his/her medications are working for him/her?  yes  Has the patient been experiencing any side effects to the medications prescribed?  no  Does the patient measure his/her own blood pressure or blood glucose at home?  yes   Does the patient have any problems obtaining medications due to transportation or finances?   no  Understanding of regimen: excellent Understanding of indications: excellent Potential of compliance: excellent    Mckensey is complaint with her medications and understands the importance of taking them.    Leshawn Straka Horald Chestnut RN 02/26/2022 12:40 PM

## 2022-03-01 ENCOUNTER — Ambulatory Visit (HOSPITAL_COMMUNITY): Payer: BC Managed Care – PPO

## 2022-03-01 ENCOUNTER — Telehealth (HOSPITAL_COMMUNITY): Payer: Self-pay | Admitting: *Deleted

## 2022-03-01 ENCOUNTER — Encounter (HOSPITAL_COMMUNITY): Payer: Self-pay | Admitting: *Deleted

## 2022-03-01 DIAGNOSIS — I5022 Chronic systolic (congestive) heart failure: Secondary | ICD-10-CM

## 2022-03-01 NOTE — Telephone Encounter (Signed)
Left message to call cardiac rehab.Addison Freimuth Walden Lexton Hidalgo RN BSN  

## 2022-03-01 NOTE — Progress Notes (Signed)
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 capabilities and proper form for all exercise.  Medical   Aging: Enhancing Your Quality of Life  Clinical staff conducted group or individual video education with verbal and written material and guidebook.  Patient learns key strategies and recommendations to stay in good physical health and enhance quality of life, such as prevention strategies, having an advocate, securing a Health Care Proxy and Power of Attorney, and keeping a list of medications and system for tracking them. It also discusses how to avoid risk for bone loss.  Biology of Weight Control  Clinical staff conducted group or individual video education with verbal and written material and guidebook.  Patient learns that weight gain occurs because we consume more calories than we burn (eating more, moving less). Even if your body  weight is normal, you may have higher ratios of fat compared to muscle mass. Too much body fat puts you at increased risk for cardiovascular disease, heart attack, stroke, type 2 diabetes, and obesity-related cancers. In addition to exercise, following the Pritikin Eating Plan can help reduce your risk.  Decoding Lab Results  Clinical staff conducted group or individual video education with verbal and written material and guidebook.  Patient learns that lab test reflects one measurement whose values change over time and are influenced by many factors, including medication, stress, sleep, exercise, food, hydration, pre-existing medical conditions, and more. It is recommended to use the knowledge from this video to become more involved with your lab results and evaluate your numbers to speak with your doctor.   Diseases of Our Time - Overview  Clinical staff conducted group or individual video education with verbal and written material and guidebook.  Patient learns that according to the CDC, 50% to 70% of chronic diseases (such as obesity, type 2 diabetes, elevated lipids, hypertension, and heart disease) are avoidable through lifestyle improvements including healthier food choices, listening to satiety cues, and increased physical activity.  Sleep Disorders Clinical staff conducted group or individual video education with verbal and written material and guidebook.  Patient learns how good quality and duration of sleep are important to overall health and well-being. Patient also learns about sleep disorders and how they impact health along with recommendations to address them, including discussing with a physician.  Nutrition  Dining Out - Part 2 Clinical staff conducted group or individual video education with verbal and written material and guidebook.  Patient learns how to plan ahead and communicate in order to maximize their dining experience in a healthy and nutritious manner. Included are  recommended food choices based on the type of restaurant the patient is visiting.   Fueling a Banker conducted group or individual video education with verbal and written material and guidebook.  There is a strong connection between our food choices and our health. Diseases like obesity and type 2 diabetes are very prevalent and are in large-part due to lifestyle choices. The Pritikin Eating Plan provides plenty of food and hunger-curbing satisfaction. It is easy to follow, affordable, and helps reduce health risks.  Menu Workshop  Clinical staff conducted group  or individual video education with verbal and written material and guidebook.  Patient learns that restaurant meals can sabotage health goals because they are often packed with calories, fat, sodium, and sugar. Recommendations include strategies to plan ahead and to communicate with the manager, chef, or server to help order a healthier meal.  Planning Your Eating Strategy  Clinical staff conducted group or individual video education with verbal and written material and guidebook.  Patient learns about the [place] and its benefit of reducing the risk of disease. The [place] does not focus on calories. Instead, it emphasizes high-quality, nutrient-rich foods. By knowing the characteristics of the foods, we choose, we can determine their calorie density and make informed decisions.  Targeting Your Nutrition Priorities  Clinical staff conducted group or individual video education with verbal and written material and guidebook.  Patient learns that lifestyle habits have a tremendous impact on disease risk and progression. This video provides eating and physical activity recommendations based on your personal health goals, such as reducing LDL cholesterol, losing weight, preventing or controlling type 2 diabetes, and reducing high blood pressure.  Vitamins and Minerals  Clinical staff conducted  group or individual video education with verbal and written material and guidebook.  Patient learns different ways to obtain key vitamins and minerals, including through a recommended healthy diet. It is important to discuss all supplements you take with your doctor.   Healthy Mind-Set    Smoking Cessation  Clinical staff conducted group or individual video education with verbal and written material and guidebook.  Patient learns that cigarette smoking and tobacco addiction pose a serious health risk which affects millions of people. Stopping smoking will significantly reduce the risk of heart disease, lung disease, and many forms of cancer. Recommended strategies for quitting are covered, including working with your doctor to develop a successful plan.  Culinary   Becoming a Financial trader conducted group or individual video education with verbal and written material and guidebook.  Patient learns that cooking at home can be healthy, cost-effective, quick, and puts them in control. Keys to cooking healthy recipes will include looking at your recipe, assessing your equipment needs, planning ahead, making it simple, choosing cost-effective seasonal ingredients, and limiting the use of added fats, salts, and sugars.  Cooking - Breakfast and Snacks  Clinical staff conducted group or individual video education with verbal and written material and guidebook.  Patient learns how important breakfast is to satiety and nutrition through the entire day. Recommendations include key foods to eat during breakfast to help stabilize blood sugar levels and to prevent overeating at meals later in the day. Planning ahead is also a key component.  Cooking - Human resources officer conducted group or individual video education with verbal and written material and guidebook.  Patient learns eating strategies to improve overall health, including an approach to cook more at home. Recommendations  include thinking of animal protein as a side on your plate rather than center stage and focusing instead on lower calorie dense options like vegetables, fruits, whole grains, and plant-based proteins, such as beans. Making sauces in large quantities to freeze for later and leaving the skin on your vegetables are also recommended to maximize your experience.  Cooking - Healthy Salads and Dressing Clinical staff conducted group or individual video education with verbal and written material and guidebook.  Patient learns that vegetables, fruits, whole grains, and legumes are the foundations of the Pritikin Eating  Plan. Recommendations include how to incorporate each of these in flavorful and healthy salads, and how to create homemade salad dressings. Proper handling of ingredients is also covered. Cooking - Soups and [employer] - Soups and Desserts Clinical staff conducted group or individual video education with verbal and written material and guidebook.  Patient learns that Pritikin soups and desserts make for easy, nutritious, and delicious snacks and meal components that are low in sodium, fat, sugar, and calorie density, while high in vitamins, minerals, and filling fiber. Recommendations include simple and healthy ideas for soups and desserts.   Overview     The Pritikin Solution Program Overview Clinical staff conducted group or individual video education with verbal and written material and guidebook.  Patient learns that the results of the [place] Program have been documented in more than 100 articles published in peer-reviewed journals, and the benefits include reducing risk factors for (and, in some cases, even reversing) high cholesterol, high blood pressure, type 2 diabetes, obesity, and more! An overview of the three key pillars of the Pritikin Program will be covered: eating well, doing regular exercise, and having a healthy mind-set.  WORKSHOPS  Exercise: Exercise Basics:  Building Your Action Plan Clinical staff led group instruction and group discussion with PowerPoint presentation and patient guidebook. To enhance the learning environment the use of posters, models and videos may be added. At the conclusion of this workshop, patients will comprehend the difference between physical activity and exercise, as well as the benefits of incorporating both, into their routine. Patients will understand the FITT (Frequency, Intensity, Time, and Type) principle and how to use it to build an exercise action plan. In addition, safety concerns and other considerations for exercise and cardiac rehab will be addressed by the presenter. The purpose of this lesson is to promote a comprehensive and effective weekly exercise routine in order to improve patients' overall level of fitness.   Managing Heart Disease: Your Path to a Healthier Heart Clinical staff led group instruction and group discussion with PowerPoint presentation and patient guidebook. To enhance the learning environment the use of posters, models and videos may be added.At the conclusion of this workshop, patients will understand the anatomy and physiology of the heart. Additionally, they will understand how Pritikin's three pillars impact the risk factors, the progression, and the management of heart disease.  The purpose of this lesson is to provide a high-level overview of the heart, heart disease, and how the Pritikin lifestyle positively impacts risk factors.  Exercise Biomechanics Clinical staff led group instruction and group discussion with PowerPoint presentation and patient guidebook. To enhance the learning environment the use of posters, models and videos may be added. Patients will learn how the structural parts of their bodies function and how these functions impact their daily activities, movement, and exercise. Patients will learn how to promote a neutral spine, learn how to manage pain, and identify  ways to improve their physical movement in order to promote healthy living. The purpose of this lesson is to expose patients to common physical limitations that impact physical activity. Participants will learn practical ways to adapt and manage aches and pains, and to minimize their effect on regular exercise. Patients will learn how to maintain good posture while sitting, walking, and lifting.  Balance Training and Fall Prevention  Clinical staff led group instruction and group discussion with PowerPoint presentation and patient guidebook. To enhance the learning environment the use of posters, models and videos may be added. At  the conclusion of this workshop, patients will understand the importance of their sensorimotor skills (vision, proprioception, and the vestibular system) in maintaining their ability to balance as they age. Patients will apply a variety of balancing exercises that are appropriate for their current level of function. Patients will understand the common causes for poor balance, possible solutions to these problems, and ways to modify their physical environment in order to minimize their fall risk. The purpose of this lesson is to teach patients about the importance of maintaining balance as they age and ways to minimize their risk of falling.  WORKSHOPS   Nutrition:  Fueling a Scientist, research (physical sciences) led group instruction and group discussion with PowerPoint presentation and patient guidebook. To enhance the learning environment the use of posters, models and videos may be added. Patients will review the foundational principles of the [place] and understand what constitutes a serving size in each of the food groups. Patients will also learn Pritikin-friendly foods that are better choices when away from home and review make-ahead meal and snack options. Calorie density will be reviewed and applied to three nutrition priorities: weight maintenance, weight  loss, and weight gain. The purpose of this lesson is to reinforce (in a group setting) the key concepts around what patients are recommended to eat and how to apply these guidelines when away from home by planning and selecting Pritikin-friendly options. Patients will understand how calorie density may be adjusted for different weight management goals.  Mindful Eating  Clinical staff led group instruction and group discussion with PowerPoint presentation and patient guidebook. To enhance the learning environment the use of posters, models and videos may be added. Patients will briefly review the concepts of the [place] and the importance of low-calorie dense foods. The concept of mindful eating will be introduced as well as the importance of paying attention to internal hunger signals. Triggers for non-hunger eating and techniques for dealing with triggers will be explored. The purpose of this lesson is to provide patients with the opportunity to review the basic principles of the [place], discuss the value of eating mindfully and how to measure internal cues of hunger and fullness using the Hunger Scale. Patients will also discuss reasons for non-hunger eating and learn strategies to use for controlling emotional eating.  Targeting Your Nutrition Priorities Clinical staff led group instruction and group discussion with PowerPoint presentation and patient guidebook. To enhance the learning environment the use of posters, models and videos may be added. Patients will learn how to determine their genetic susceptibility to disease by reviewing their family history. Patients will gain insight into the importance of diet as part of an overall healthy lifestyle in mitigating the impact of genetics and other environmental insults. The purpose of this lesson is to provide patients with the opportunity to assess their personal nutrition priorities by looking at their family history, their own  health history and current risk factors. Patients will also be able to discuss ways of prioritizing and modifying the [place] for their highest risk areas  Menu  Clinical staff led group instruction and group discussion with PowerPoint presentation and patient guidebook. To enhance the learning environment the use of posters, models and videos may be added. Using menus brought in from [employer], or printed from [employer], patients will apply the [place] dining out guidelines that were presented in the [employer] video. Patients will also be able to practice these guidelines in  a variety of provided scenarios. The purpose of this lesson is to provide patients with the opportunity to practice hands-on learning of the [place] with actual menus and practice scenarios.  Label Reading Clinical staff led group instruction and group discussion with PowerPoint presentation and patient guidebook. To enhance the learning environment the use of posters, models and videos may be added. Patients will review and discuss the Pritikin label reading guidelines presented in Pritikin's Label Reading Educational series video. Using fool labels brought in from local grocery stores and markets, patients will apply the label reading guidelines and determine if the packaged food meet the Pritikin guidelines. The purpose of this lesson is to provide patients with the opportunity to review, discuss, and practice hands-on learning of the Pritikin Label Reading guidelines with actual packaged food labels. [place] Workshops are designed to teach patients ways to prepare quick, simple, and affordable recipes at home. The importance of nutrition's role in chronic disease risk reduction is reflected in its emphasis in the overall Pritikin program. By learning how to prepare essential core Pritikin Eating Plan recipes, patients will  increase control over what they eat; be able to customize the flavor of foods without the use of added salt, sugar, or fat; and improve the quality of the food they consume. By learning a set of core recipes which are easily assembled, quickly prepared, and affordable, patients are more likely to prepare more healthy foods at home. These workshops focus on convenient breakfasts, simple entres, side dishes, and desserts which can be prepared with minimal effort and are consistent with nutrition recommendations for cardiovascular risk reduction. Cooking [employer] are taught by a Engineer, materials (RD) who has been trained by the [employer]. The chef or RD has a clear understanding of the importance of minimizing - if not completely eliminating - added fat, sugar, and sodium in recipes. Throughout the series of [place] Workshop sessions, patients will learn about healthy ingredients and efficient methods of cooking to build confidence in their capability to prepare    Cooking School weekly topics:  Adding Flavor- Sodium-Free  Fast and Healthy Breakfasts  Powerhouse Plant-Based Proteins  Satisfying Salads and Dressings  Simple Sides and Sauces  International Cuisine-Spotlight on the [place] Zones  Delicious Desserts  Savory Soups  Efficiency Cooking - Meals in a Snap  Tasty Appetizers and Snacks  Comforting Weekend Breakfasts  One-Pot Wonders   Fast Evening Meals  Easy [place] (Psychosocial): New Thoughts, New Behaviors Clinical staff led group instruction and group discussion with PowerPoint presentation and patient guidebook. To enhance the learning environment the use of posters, models and videos may be added. Patients will learn and practice techniques for developing effective health and lifestyle goals. Patients will be able to effectively apply the goal setting process learned to  develop at least one new personal goal.  The purpose of this lesson is to expose patients to a new skill set of behavior modification techniques such as techniques setting SMART goals, overcoming barriers, and achieving new thoughts and new behaviors.  Managing Moods and Relationships Clinical staff led group instruction and group discussion with PowerPoint presentation and patient guidebook. To enhance the learning environment the use of posters, models and videos may be added. Patients will learn how emotional and chronic stress factors can impact their health and relationships. They will learn healthy ways to manage their  moods and utilize positive coping mechanisms. In addition, ICR patients will learn ways to improve communication skills. The purpose of this lesson is to expose patients to ways of understanding how one's mood and health are intimately connected. Developing a healthy outlook can help build positive relationships and connections with others. Patients will understand the importance of utilizing effective communication skills that include actively listening and being heard. They will learn and understand the importance of the "4 Cs" and especially Connections in fostering of a Healthy Mind-Set.  Healthy Sleep for a Healthy Heart Clinical staff led group instruction and group discussion with PowerPoint presentation and patient guidebook. To enhance the learning environment the use of posters, models and videos may be added. At the conclusion of this workshop, patients will be able to demonstrate knowledge of the importance of sleep to overall health, well-being, and quality of life. They will understand the symptoms of, and treatments for, common sleep disorders. Patients will also be able to identify daytime and nighttime behaviors which impact sleep, and they will be able to apply these tools to help manage sleep-related challenges. The purpose of this lesson is to provide patients with a  general overview of sleep and outline the importance of quality sleep. Patients will learn about a few of the most common sleep disorders. Patients will also be introduced to the concept of "sleep hygiene," and discover ways to self-manage certain sleeping problems through simple daily behavior changes. Finally, the workshop will motivate patients by clarifying the links between quality sleep and their goals of heart-healthy living.   Recognizing and Reducing Stress Clinical staff led group instruction and group discussion with PowerPoint presentation and patient guidebook. To enhance the learning environment the use of posters, models and videos may be added. At the conclusion of this workshop, patients will be able to understand the types of stress reactions, differentiate between acute and chronic stress, and recognize the impact that chronic stress has on their health. They will also be able to apply different coping mechanisms, such as reframing negative self-talk. Patients will have the opportunity to practice a variety of stress management techniques, such as deep abdominal breathing, progressive muscle relaxation, and/or guided imagery.  The purpose of this lesson is to educate patients on the role of stress in their lives and to provide healthy techniques for coping with it.  Learning Barriers/Preferences:  Learning Barriers/Preferences - 02/25/22 1256       Learning Barriers/Preferences   Learning Barriers Sight   pt wears glasses   Learning Preferences Computer/Internet;Group Instruction;Individual Instruction;Pictoral;Skilled Demonstration;Video;Written Material             Education Topics:  Knowledge Questionnaire Score:  Knowledge Questionnaire Score - 02/25/22 1316       Knowledge Questionnaire Score   Pre Score 21/24             Core Components/Risk Factors/Patient Goals at Admission:  Personal Goals and Risk Factors at Admission - 02/25/22 1256       Core  Components/Risk Factors/Patient Goals on Admission    Weight Management Yes;Obesity;Weight Loss    Intervention Weight Management: Develop a combined nutrition and exercise program designed to reach desired caloric intake, while maintaining appropriate intake of nutrient and fiber, sodium and fats, and appropriate energy expenditure required for the weight goal.;Weight Management: Provide education and appropriate resources to help participant work on and attain dietary goals.;Weight Management/Obesity: Establish reasonable short term and long term weight goals.;Obesity: Provide education and appropriate resources to help participant work  on and attain dietary goals.    Admit Weight 323 lb 6.6 oz (146.7 kg)    Expected Outcomes Long Term: Adherence to nutrition and physical activity/exercise program aimed toward attainment of established weight goal;Short Term: Continue to assess and modify interventions until short term weight is achieved;Weight Loss: Understanding of general recommendations for a balanced deficit meal plan, which promotes 1-2 lb weight loss per week and includes a negative energy balance of [phone] kcal/d;Understanding recommendations for meals to include 15-35% energy as protein, 25-35% energy from fat, 35-60% energy from carbohydrates, less than 200mg  of dietary cholesterol, 20-35 gm of total fiber daily;Understanding of distribution of calorie intake throughout the day with the consumption of 4-5 meals/snacks    Heart Failure Yes    Intervention Provide a combined exercise and nutrition program that is supplemented with education, support and counseling about heart failure. Directed toward relieving symptoms such as shortness of breath, decreased exercise tolerance, and extremity edema.    Expected Outcomes Improve functional capacity of life;Short term: Attendance in program 2-3 days a week with increased exercise capacity. Reported lower sodium intake. Reported increased fruit and  vegetable intake. Reports medication compliance.;Short term: Daily weights obtained and reported for increase. Utilizing diuretic protocols set by physician.;Long term: Adoption of self-care skills and reduction of barriers for early signs and symptoms recognition and intervention leading to self-care maintenance.    Hypertension Yes    Intervention Provide education on lifestyle modifcations including regular physical activity/exercise, weight management, moderate sodium restriction and increased consumption of fresh fruit, vegetables, and low fat dairy, alcohol moderation, and smoking cessation.;Monitor prescription use compliance.    Expected Outcomes Short Term: Continued assessment and intervention until BP is < 140/62mm HG in hypertensive participants. < 130/43mm HG in hypertensive participants with diabetes, heart failure or chronic kidney disease.;Long Term: Maintenance of blood pressure at goal levels.    Stress Yes    Intervention Offer individual and/or small group education and counseling on adjustment to heart disease, stress management and health-related lifestyle change. Teach and support self-help strategies.;Refer participants experiencing significant psychosocial distress to appropriate mental health specialists for further evaluation and treatment. When possible, include family members and significant others in education/counseling sessions.    Expected Outcomes Short Term: Participant demonstrates changes in health-related behavior, relaxation and other stress management skills, ability to obtain effective social support, and compliance with psychotropic medications if prescribed.;Long Term: Emotional wellbeing is indicated by absence of clinically significant psychosocial distress or social isolation.             Core Components/Risk Factors/Patient Goals Review:    Core Components/Risk Factors/Patient Goals at Discharge (Final Review):    ITP Comments:  ITP Comments      Row Name 02/25/22 1418 03/01/22 1451         ITP Comments Dr. Fransico Him medical director. Introduction to [facility]- intitial orientation packet reviewed with patient. 30 Day ITP Review. Hadlea is to start exercise at intensive cardiac rehab this week               Comments: See ITP comments.Harrell Gave RN BSN

## 2022-03-03 ENCOUNTER — Ambulatory Visit (HOSPITAL_COMMUNITY): Payer: BC Managed Care – PPO

## 2022-03-04 ENCOUNTER — Other Ambulatory Visit: Payer: Self-pay | Admitting: Nurse Practitioner

## 2022-03-04 DIAGNOSIS — R632 Polyphagia: Secondary | ICD-10-CM

## 2022-03-04 MED ORDER — LISDEXAMFETAMINE DIMESYLATE 20 MG PO CAPS: 20.0000 mg | ORAL_CAPSULE | Freq: Every day | ORAL | 0 refills | Status: DC

## 2022-03-05 ENCOUNTER — Ambulatory Visit (HOSPITAL_COMMUNITY): Payer: BC Managed Care – PPO

## 2022-03-08 ENCOUNTER — Ambulatory Visit (HOSPITAL_COMMUNITY): Payer: BC Managed Care – PPO

## 2022-03-10 ENCOUNTER — Ambulatory Visit (HOSPITAL_COMMUNITY): Payer: BC Managed Care – PPO

## 2022-03-11 ENCOUNTER — Encounter: Payer: Self-pay | Admitting: Nurse Practitioner

## 2022-03-11 ENCOUNTER — Other Ambulatory Visit: Payer: Self-pay | Admitting: Nurse Practitioner

## 2022-03-11 DIAGNOSIS — F418 Other specified anxiety disorders: Secondary | ICD-10-CM

## 2022-03-12 ENCOUNTER — Ambulatory Visit (HOSPITAL_COMMUNITY): Payer: BC Managed Care – PPO

## 2022-03-12 ENCOUNTER — Other Ambulatory Visit: Payer: Self-pay | Admitting: Nurse Practitioner

## 2022-03-12 ENCOUNTER — Telehealth (HOSPITAL_COMMUNITY): Payer: Self-pay | Admitting: *Deleted

## 2022-03-12 DIAGNOSIS — F418 Other specified anxiety disorders: Secondary | ICD-10-CM

## 2022-03-12 MED ORDER — ALPRAZOLAM 1 MG PO TABS: 1.0000 mg | ORAL_TABLET | Freq: Two times a day (BID) | ORAL | 0 refills | Status: DC | PRN

## 2022-03-12 NOTE — Telephone Encounter (Addendum)
Left message to call cardiac rehab will discharge from the program if we don't hear back from [place] as she has not attended cardiac rehab since orientation. 2:23 pm patient has been discharged from cardiac rehab. Appointments have been canceled. Gladstone Lighter, RN,BSN 03/12/2022 12:01 PM

## 2022-03-15 ENCOUNTER — Ambulatory Visit (HOSPITAL_COMMUNITY): Payer: BC Managed Care – PPO

## 2022-03-17 ENCOUNTER — Ambulatory Visit (HOSPITAL_COMMUNITY): Payer: BC Managed Care – PPO

## 2022-03-19 ENCOUNTER — Ambulatory Visit (INDEPENDENT_AMBULATORY_CARE_PROVIDER_SITE_OTHER): Payer: Self-pay | Admitting: Psychology

## 2022-03-19 ENCOUNTER — Ambulatory Visit (HOSPITAL_COMMUNITY): Payer: BC Managed Care – PPO

## 2022-03-19 ENCOUNTER — Encounter: Payer: Self-pay | Admitting: Nurse Practitioner

## 2022-03-19 DIAGNOSIS — F418 Other specified anxiety disorders: Secondary | ICD-10-CM

## 2022-03-19 NOTE — Progress Notes (Signed)
[facility] Counselor/Therapist Progress Note  Patient ID: Samantha Romero, MRN: [id number]   Date: 03/19/22  Time Spent: 9:33  am - 10:37 am : 64 Minutes  Treatment Type: Individual Therapy.  Reported Symptoms: depression and anxiety.  Mental Status Exam: Appearance:  Neat and Well Groomed     Behavior: Appropriate  Motor: Normal  Speech/Language:  Clear and Coherent and Normal Rate  Affect: Depressed  Mood: dysthymic  Thought process: normal  Thought content:   WNL  Sensory/Perceptual disturbances:   WNL  Orientation: oriented to person, place, time/date, and situation  Attention: Good  Concentration: Good  Memory: WNL  Fund of knowledge:  Good  Insight:   Good  Judgment:  Good  Impulse Control: Good   Risk Assessment: Danger to Self:  No Self-injurious Behavior: No Danger to Others: No Duty to Warn:no Physical Aggression / Violence:No  Access to Firearms a concern: No  Gang Involvement:No   In case of a mental health emergency:  99 - confidential suicide hotline. [facility]):        [place], [place] [address]       [phone] 3.   911  4.   Visiting Nearest ED.   Subjective:   Samantha Romero participated from home, via video and consented to treatment. Therapist participated from home office. We met online due to [place] pandemic.[name] noted contacting the office, for continued treatment, due to difficulty managing symptoms including anxiety and depression. She noted her family "intervening" due to her unmanageable symptoms. She noted difficulty leaving the home, "getting up", and engaging consistently with others. Her current stressors including her level of depression (leaving the home, lethargy, low motivation, lack of interest) &  being out of work. She noted being prescribed Vyvanse 48m qd and noted improvement in executive dysfunction as a result. She noted difficulty taking her  medication regularly due to waking up late and not wanting to take the meds later in the day. She noted continuing to take her alprazolam, bupropion, Cymbalta, and Trazodone but noted a lack of consistency with her medication. She noted working on taking her medication consistently via a pill box. We discussed the importance of medication adherence and the risks associated with a lack of adherence. She noted need to improve her self-care. She noted difficulty showering and brushing teeth on a consistent basis. She noted work related stressors. She noted leaving this job and applying with a different company and while not selected, that a position created for her due to the impression she made during the interview process. She noted this arrangement not working well, overall. She noted receiving a severance package but noted anxiety about finding a new position. She noted a need to decrease her social media, internet use, and phone use. She noted using the phone as an "escape". Therapist provided psycho-education regarding the importance of managing phone usage and addressing stressors proactively. Samantha Romero denied any SI during the session but noted a recent history. She noted calling friends for support when feeling unsafe, in the past. We reviewed safety during the session, see above. Additionally, therapist encouraged [name] to complete [employer] which was supplied via email. Samantha Romero was engaged and motivated during the session. We scheduled follow-up to complete annual evaluation and identify treatment goals. Therapist answered any and all questions during the session. Therapist provided supportive therapy. Samantha Romero was provided with a release to complete to facilitate a referral for psychiatric  treatment ([facility]).   Interventions: Cognitive Behavioral Therapy and Safety Review  Diagnosis:  Other specified anxiety disorders  Treatment Plan:  Client  Abilities/Strengths Dhara is intelligent, well spoken, and motivated for change.   Support System: Family and friends.   Client Treatment Preferences OPT  Client Statement of Needs Thecla would like to develop skills move forward (incremental change), decrease stress, and reduce anxiety.    Treatment Level Weekly  Symptoms Depression: Lethargy, low motivation, disturbed sleep, loss of interest, feeling down, fluctuating appetite (low intake w/poor diet), feeling bad about self, difficulty concentrating, psychomotor agitation/retardation, weight-gain  (Status: maintained)  Anxiety: Rapid Heart rate, Sweating, feeling nervous, difficulty managing worry, worrying about different things, difficulty relaxing, restlessness, irritability,  feeling afraid something awful might happen, social anxiety.    (Status: maintained)  Goals:   Samantha Romero experiences symptoms of depression and anxiety.   Target Date: 03/23/22 Frequency: Weekly  Progress: 0 Modality: individual    Therapist will provide referrals for additional resources as appropriate.  Therapist will provide psycho-education regarding Samantha Romero's diagnosis and corresponding treatment approaches and interventions. Licensed Clinical Social Worker, [place], LCSW will support the patient's ability to achieve the goals identified. will employ CBT, BA, Problem-solving, Solution Focused, Mindfulness,  coping skills, & other evidenced-based practices will be used to promote progress towards healthy functioning to help manage decrease symptoms associated with her diagnosis.   Reduce overall level, frequency, and intensity of the feelings of depression and anxiety evidenced by decreased overall symptoms of depression and anxiety, from 6 to 7 days/week to 0 to 1 days/week per client report for at least 3 consecutive months. Verbally express understanding of the relationship between feelings of depression, anxiety and their impact on thinking  patterns and behaviors. Verbalize an understanding of the role that distorted thinking plays in creating fears, excessive worry, and ruminations.    (Samantha Romero participated in the creation of the treatment plan)   Buena Irish, LCSW

## 2022-03-24 ENCOUNTER — Ambulatory Visit (INDEPENDENT_AMBULATORY_CARE_PROVIDER_SITE_OTHER): Payer: BC Managed Care – PPO | Admitting: Nurse Practitioner

## 2022-03-24 ENCOUNTER — Ambulatory Visit (HOSPITAL_COMMUNITY): Payer: BC Managed Care – PPO

## 2022-03-24 ENCOUNTER — Other Ambulatory Visit: Payer: Self-pay | Admitting: Nurse Practitioner

## 2022-03-24 ENCOUNTER — Encounter: Payer: Self-pay | Admitting: Nurse Practitioner

## 2022-03-24 ENCOUNTER — Other Ambulatory Visit: Payer: Self-pay

## 2022-03-24 VITALS — BP 126/84 | HR 103 | Temp 98.6°F | Ht 62.0 in | Wt 316.6 lb

## 2022-03-24 DIAGNOSIS — J4540 Moderate persistent asthma, uncomplicated: Secondary | ICD-10-CM

## 2022-03-24 DIAGNOSIS — F418 Other specified anxiety disorders: Secondary | ICD-10-CM

## 2022-03-24 DIAGNOSIS — I110 Hypertensive heart disease with heart failure: Secondary | ICD-10-CM

## 2022-03-24 DIAGNOSIS — E66813 Obesity, class 3: Secondary | ICD-10-CM

## 2022-03-24 DIAGNOSIS — R632 Polyphagia: Secondary | ICD-10-CM

## 2022-03-24 DIAGNOSIS — J4520 Mild intermittent asthma, uncomplicated: Secondary | ICD-10-CM

## 2022-03-24 DIAGNOSIS — I5021 Acute systolic (congestive) heart failure: Secondary | ICD-10-CM

## 2022-03-24 DIAGNOSIS — R7309 Other abnormal glucose: Secondary | ICD-10-CM

## 2022-03-24 DIAGNOSIS — Z6841 Body Mass Index (BMI) 40.0 and over, adult: Secondary | ICD-10-CM

## 2022-03-24 DIAGNOSIS — I10 Essential (primary) hypertension: Secondary | ICD-10-CM

## 2022-03-24 MED ORDER — PREDNISONE 10 MG (21) PO TBPK: ORAL_TABLET | ORAL | 0 refills | Status: DC

## 2022-03-24 MED ORDER — ALBUTEROL SULFATE (2.5 MG/3ML) 0.083% IN NEBU: 2.5000 mg | INHALATION_SOLUTION | Freq: Four times a day (QID) | RESPIRATORY_TRACT | 1 refills | Status: AC | PRN

## 2022-03-24 MED ORDER — MONTELUKAST SODIUM 10 MG PO TABS: ORAL_TABLET | ORAL | 2 refills | Status: DC

## 2022-03-24 MED ORDER — ALBUTEROL SULFATE HFA 108 (90 BASE) MCG/ACT IN AERS: 2.0000 | INHALATION_SPRAY | Freq: Four times a day (QID) | RESPIRATORY_TRACT | 2 refills | Status: AC | PRN

## 2022-03-24 MED ORDER — LISDEXAMFETAMINE DIMESYLATE 20 MG PO CAPS: 20.0000 mg | ORAL_CAPSULE | Freq: Every day | ORAL | 0 refills | Status: DC

## 2022-03-24 MED ORDER — LISDEXAMFETAMINE DIMESYLATE 30 MG PO CAPS: 30.0000 mg | ORAL_CAPSULE | Freq: Every day | ORAL | 0 refills | Status: DC

## 2022-03-24 NOTE — Addendum Note (Signed)
Addended by: [name] F on: 03/24/2022 03:34 PM   Modules accepted: Level of Service

## 2022-03-24 NOTE — Progress Notes (Addendum)
I,Victoria T Hamilton,acting as a Neurosurgeon for [name], FNP.,have documented all relevant documentation on the behalf of [name], FNP,as directed by  [name], FNP while in the presence of [name], FNP.    Subjective:     Patient ID: Samantha Romero , female    DOB: [birth year] , 46 y.o.   MRN: [id number]   Chief Complaint  Patient presents with   Hypertension    HPI  Patient presents today for a blood pressure f/u and vyvanse. She is followed by [facility] for her GI issues.  Today she feels as if she is experiencing  an asthma flare up. She wants to know if she could get prednisone prescribed. She is experiencing nasal drip & coughing up phlegm.  She reports when she gets exposed to something her asthma will flare. She is having a hoarse voice. She was around family who smoke during the holidays. She has not used her albuterol inhaler due to not feeling like is that bad.  She reports seeing Lennice Sites, Therapist. She was recommended to increase the vyvanse when it works she is able to focus, does not eat as much and has more energy. She has found a psychiatrist and her appointment on 01/17 with [facility] Readings from Last 3 Encounters: 03/24/22 : (!) 316 lb 9.6 oz (143.6 kg) 02/25/22 : (!) 323 lb 6.6 oz (146.7 kg) 01/06/22 : (!) 323 lb 6.4 oz (146.7 kg)    Hypertension This is a chronic problem. The current episode started more than 1 year ago. The problem is unchanged. The problem is controlled. Pertinent negatives include no anxiety or blurred vision. There are no associated agents to hypertension. Risk factors for coronary artery disease include obesity and sedentary lifestyle. Past treatments include angiotensin blockers. There are no compliance problems.  There is no history of angina. There is no history of chronic renal disease.  Diabetes She presents for her follow-up diabetic visit. Diabetes type: abnormal glucose. There are no hypoglycemic  associated symptoms. Pertinent negatives for diabetes include no blurred vision. There are no hypoglycemic complications. There are no diabetic complications. Risk factors for coronary artery disease include obesity and sedentary lifestyle. When asked about current treatments, none were reported.     Past Medical History:  Diagnosis Date   Anxiety    panic attacks   Asthma    CHF (congestive heart failure), NYHA class II, acute, systolic (HCC) 10/06/2018   Depression    Fibromyalgia    Hypertension    no meds currently   No pertinent past medical history    Pneumonia    hx of    Sinus problem      Family History  Problem Relation Age of Onset   Ulcers Mother    Hypertension Mother    Cancer Maternal Uncle        lung   Cancer Maternal Grandfather        lung and back     Current Outpatient Medications:    ALPRAZolam (XANAX) 1 MG tablet, Take 1 tablet (1 mg total) by mouth 2 (two) times daily as needed for anxiety., Disp: 20 tablet, Rfl: 0   azelastine (ASTELIN) 0.1 % nasal spray, Place 2 sprays into both nostrils 2 (two) times daily., Disp: 30 mL, Rfl: 5   buPROPion (WELLBUTRIN XL) 150 MG 24 hr tablet, Take 3 tablets (450 mg total) by mouth daily., Disp: 270 tablet, Rfl: 2   carvedilol (COREG) 25 MG tablet,  TAKE 1 TABLET (25 MG TOTAL) BY MOUTH TWICE A DAY WITH MEALS, Disp: 180 tablet, Rfl: 3   DULoxetine (CYMBALTA) 60 MG capsule, TAKE 1 CAPSULE BY MOUTH EVERY DAY, Disp: 90 capsule, Rfl: 1   empagliflozin (JARDIANCE) 10 MG TABS tablet, TAKE ONE TABLET BY MOUTH DAILY BEFORE BREAKFAST, Disp: 90 tablet, Rfl: 3   Fluticasone-Salmeterol 232-14 MCG/ACT AEPB, Inhale 1 puff into the lungs in the morning and at bedtime., Disp: 1 each, Rfl: 5   furosemide (LASIX) 80 MG tablet, Take 1 tablet (80 mg total) by mouth daily., Disp: 90 tablet, Rfl: 3   levocetirizine (XYZAL) 5 MG tablet, TAKE 1 TABLET BY MOUTH EVERY DAY IN THE EVENING, Disp: 90 tablet, Rfl: 0   levonorgestrel (MIRENA, 52 MG,)  20 MCG/DAY IUD, , Disp: , Rfl:    meloxicam (MOBIC) 15 MG tablet, TAKE 1 TABLET BY MOUTH EVERY DAY AS NEEDED FOR PAIN, Disp: 30 tablet, Rfl: 0   predniSONE (STERAPRED UNI-PAK 21 TAB) 10 MG (21) TBPK tablet, Take as directed, Disp: 21 tablet, Rfl: 0   sacubitril-valsartan (ENTRESTO) 97-103 MG, Take 1 tablet by mouth 2 (two) times daily., Disp: 60 tablet, Rfl: 11   spironolactone (ALDACTONE) 25 MG tablet, Take 1 tablet (25 mg total) by mouth daily., Disp: 90 tablet, Rfl: 3   traZODone (DESYREL) 50 MG tablet, TAKE 1 TABLET BY MOUTH EVERYDAY AT BEDTIME (Patient taking differently: Take 50 mg by mouth as needed for sleep.), Disp: 30 tablet, Rfl: 2   albuterol (PROVENTIL) (2.5 MG/3ML) 0.083% nebulizer solution, Take 3 mLs (2.5 mg total) by nebulization every 6 (six) hours as needed for wheezing or shortness of breath., Disp: 150 mL, Rfl: 1   albuterol (VENTOLIN HFA) 108 (90 Base) MCG/ACT inhaler, Inhale 2 puffs into the lungs every 6 (six) hours as needed for wheezing or shortness of breath., Disp: 8 g, Rfl: 2   lisdexamfetamine (VYVANSE) 30 MG capsule, Take 1 capsule (30 mg total) by mouth daily., Disp: 30 capsule, Rfl: 0   montelukast (SINGULAIR) 10 MG tablet, TAKE 1 TABLET BY MOUTH EVERYDAY AT BEDTIME, Disp: 90 tablet, Rfl: 2   omeprazole (PRILOSEC) 40 MG capsule, TAKE 1 CAPSULE BY MOUTH EVERY DAY (Patient not taking: Reported on 03/24/2022), Disp: 90 capsule, Rfl: 1   Allergies  Allergen Reactions   Grass Extracts [Gramineae Pollens]     Nasal congestion, post nasal drip    Shellfish Allergy Itching    In throat only.     Review of Systems  Constitutional: Negative.   Eyes:  Negative for blurred vision.  Respiratory:  Positive for wheezing. Negative for cough and chest tightness.   Cardiovascular: Negative.   Neurological: Negative.   Psychiatric/Behavioral: Negative.       Today's Vitals   03/24/22 1451  BP: 126/84  Pulse: (!) 103  Temp: 98.6 F (37 C)  SpO2: 92%  Weight: (!) 316  lb 9.6 oz (143.6 kg)  Height: 5\' 2"  (1.575 m)   Body mass index is 57.91 kg/m.  Wt Readings from Last 3 Encounters:  03/24/22 (!) 316 lb 9.6 oz (143.6 kg)  02/25/22 (!) 323 lb 6.6 oz (146.7 kg)  01/06/22 (!) 323 lb 6.4 oz (146.7 kg)    Objective:  Physical Exam Vitals reviewed.  Constitutional:      General: She is not in acute distress.    Appearance: Normal appearance. She is obese.  Cardiovascular:     Rate and Rhythm: Normal rate and regular rhythm.     Pulses:  Normal pulses.     Heart sounds: Normal heart sounds. No murmur heard. Pulmonary:     Effort: Pulmonary effort is normal. No respiratory distress.     Breath sounds: Normal breath sounds. No wheezing.  Musculoskeletal:     Right lower leg: No edema.     Left lower leg: No edema.  Skin:    General: Skin is warm and dry.     Capillary Refill: Capillary refill takes less than 2 seconds.  Neurological:     General: No focal deficit present.     Mental Status: She is alert and oriented to person, place, and time.     Cranial Nerves: No cranial nerve deficit.     Motor: No weakness.  Psychiatric:        Mood and Affect: Mood normal.        Behavior: Behavior normal.        Thought Content: Thought content normal.        Judgment: Judgment normal.         Assessment And Plan:     1. Essential hypertension Comments: Blood pressure is fairly controlled, continue current medications and f/u with Cardiology  2. Abnormal glucose Comments: HgbA1c was stable at last check. Continue focusing on healthy diet low in sugar and starches.  3. Depression with anxiety Comments: Continue f/u with therapist and see your new psychiatrist.  4. CHF (congestive heart failure), NYHA class II, acute, systolic (HCC) Comments: Continue f/u with Cardiology overall doing well,  5. Binge eating Comments: Will increase Vyvanse to 30 mg to see if helps more with her appetite. - lisdexamfetamine (VYVANSE) 30 MG capsule; Take 1 capsule  (30 mg total) by mouth daily.  Dispense: 30 capsule; Refill: 0  6. Class 3 severe obesity due to excess calories with serious comorbidity and body mass index (BMI) of 50.0 to 59.9 in adult [facility]) She is encouraged to strive for BMI less than 30 to decrease cardiac risk. Advised to aim for at least 150 minutes of exercise per week.  Congratulated on her 9 lb weight loss.   7. Mild intermittent asthma, unspecified whether complicated Comments: No wheezing on physical exam however she does have hoarsenss and speaking in short sentences, will treat with prednisone taper. Refill on albuterol also sent - albuterol (VENTOLIN HFA) 108 (90 Base) MCG/ACT inhaler; Inhale 2 puffs into the lungs every 6 (six) hours as needed for wheezing or shortness of breath.  Dispense: 8 g; Refill: 2 - predniSONE (STERAPRED UNI-PAK 21 TAB) 10 MG (21) TBPK tablet; Take as directed  Dispense: 21 tablet; Refill: 0    Patient was given opportunity to ask questions. Patient verbalized understanding of the plan and was able to repeat key elements of the plan. All questions were answered to their satisfaction.  [name], FNP   I, [name], FNP, have reviewed all documentation for this visit. The documentation on 03/24/22 for the exam, diagnosis, procedures, and orders are all accurate and complete.   IF YOU HAVE BEEN REFERRED TO A SPECIALIST, IT MAY TAKE 1-2 WEEKS TO SCHEDULE/PROCESS THE REFERRAL. IF YOU HAVE NOT HEARD FROM US/SPECIALIST IN TWO WEEKS, PLEASE GIVE [place] A CALL AT [phone].   THE PATIENT IS ENCOURAGED TO PRACTICE SOCIAL DISTANCING DUE TO THE COVID-19 PANDEMIC.

## 2022-03-24 NOTE — Patient Instructions (Signed)
Hypertension, Adult ?Hypertension is another name for high blood pressure. High blood pressure forces your heart to work harder to pump blood. This can cause problems over time. ?There are two numbers in a blood pressure reading. There is a top number (systolic) over a bottom number (diastolic). It is best to have a blood pressure that is below 120/80. ?What are the causes? ?The cause of this condition is not known. Some other conditions can lead to high blood pressure. ?What increases the risk? ?Some lifestyle factors can make you more likely to develop high blood pressure: ?Smoking. ?Not getting enough exercise or physical activity. ?Being overweight. ?Having too much fat, sugar, calories, or salt (sodium) in your diet. ?Drinking too much alcohol. ?Other risk factors include: ?Having any of these conditions: ?Heart disease. ?Diabetes. ?High cholesterol. ?Kidney disease. ?Obstructive sleep apnea. ?Having a family history of high blood pressure and high cholesterol. ?Age. The risk increases with age. ?Stress. ?What are the signs or symptoms? ?High blood pressure may not cause symptoms. Very high blood pressure (hypertensive crisis) may cause: ?Headache. ?Fast or uneven heartbeats (palpitations). ?Shortness of breath. ?Nosebleed. ?Vomiting or feeling like you may vomit (nauseous). ?Changes in how you see. ?Very bad chest pain. ?Feeling dizzy. ?Seizures. ?How is this treated? ?This condition is treated by making healthy lifestyle changes, such as: ?Eating healthy foods. ?Exercising more. ?Drinking less alcohol. ?Your doctor may prescribe medicine if lifestyle changes do not help enough and if: ?Your top number is above 130. ?Your bottom number is above 80. ?Your personal target blood pressure may vary. ?Follow these instructions at home: ?Eating and drinking ? ?If told, follow the DASH eating plan. To follow this plan: ?Fill one half of your plate at each meal with fruits and vegetables. ?Fill one fourth of your plate  at each meal with whole grains. Whole grains include whole-wheat pasta, brown rice, and whole-grain bread. ?Eat or drink low-fat dairy products, such as skim milk or low-fat yogurt. ?Fill one fourth of your plate at each meal with low-fat (lean) proteins. Low-fat proteins include fish, chicken without skin, eggs, beans, and tofu. ?Avoid fatty meat, cured and processed meat, or chicken with skin. ?Avoid pre-made or processed food. ?Limit the amount of salt in your diet to less than 1,500 mg each day. ?Do not drink alcohol if: ?Your doctor tells you not to drink. ?You are pregnant, may be pregnant, or are planning to become pregnant. ?If you drink alcohol: ?Limit how much you have to: ?0-1 drink a day for women. ?0-2 drinks a day for men. ?Know how much alcohol is in your drink. In the [place] one drink equals one 12 oz bottle of beer (355 mL), one 5 oz glass of wine (148 mL), or one 1? oz glass of hard liquor (44 mL). ?Lifestyle ? ?Work with your doctor to stay at a healthy weight or to lose weight. Ask your doctor what the best weight is for you. ?Get at least 30 minutes of exercise that causes your heart to beat faster (aerobic exercise) most days of the week. This may include walking, swimming, or biking. ?Get at least 30 minutes of exercise that strengthens your muscles (resistance exercise) at least 3 days a week. This may include lifting weights or doing Pilates. ?Do not smoke or use any products that contain nicotine or tobacco. If you need help quitting, ask your doctor. ?Check your blood pressure at home as told by your doctor. ?Keep all follow-up visits. ?Medicines ?Take over-the-counter and prescription medicines   only as told by your doctor. Follow directions carefully. ?Do not skip doses of blood pressure medicine. The medicine does not work as well if you skip doses. Skipping doses also puts you at risk for problems. ?Ask your doctor about side effects or reactions to medicines that you should watch  for. ?Contact a doctor if: ?You think you are having a reaction to the medicine you are taking. ?You have headaches that keep coming back. ?You feel dizzy. ?You have swelling in your ankles. ?You have trouble with your vision. ?Get help right away if: ?You get a very bad headache. ?You start to feel mixed up (confused). ?You feel weak or numb. ?You feel faint. ?You have very bad pain in your: ?Chest. ?Belly (abdomen). ?You vomit more than once. ?You have trouble breathing. ?These symptoms may be an emergency. Get help right away. Call 911. ?Do not wait to see if the symptoms will go away. ?Do not drive yourself to the hospital. ?Summary ?Hypertension is another name for high blood pressure. ?High blood pressure forces your heart to work harder to pump blood. ?For most people, a normal blood pressure is less than 120/80. ?Making healthy choices can help lower blood pressure. If your blood pressure does not get lower with healthy choices, you may need to take medicine. ?This information is not intended to replace advice given to you by your health care provider. Make sure you discuss any questions you have with your health care provider. ?Document Revised: 01/01/2021 Document Reviewed: 01/01/2021 ?Elsevier Patient Education ? 2023 [employer] ? ?

## 2022-03-26 ENCOUNTER — Ambulatory Visit (HOSPITAL_COMMUNITY): Payer: BC Managed Care – PPO

## 2022-03-31 ENCOUNTER — Ambulatory Visit (HOSPITAL_COMMUNITY): Payer: BC Managed Care – PPO

## 2022-04-01 ENCOUNTER — Ambulatory Visit (INDEPENDENT_AMBULATORY_CARE_PROVIDER_SITE_OTHER): Payer: BC Managed Care – PPO | Admitting: Psychology

## 2022-04-01 ENCOUNTER — Encounter: Payer: Self-pay | Admitting: Nurse Practitioner

## 2022-04-01 DIAGNOSIS — F418 Other specified anxiety disorders: Secondary | ICD-10-CM
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
## 2022-04-02 ENCOUNTER — Ambulatory Visit (HOSPITAL_COMMUNITY): Payer: BC Managed Care – PPO

## 2022-04-03 ENCOUNTER — Ambulatory Visit: Payer: BC Managed Care – PPO

## 2022-04-05 ENCOUNTER — Ambulatory Visit (HOSPITAL_COMMUNITY): Payer: BC Managed Care – PPO

## 2022-04-05 ENCOUNTER — Other Ambulatory Visit: Payer: Self-pay | Admitting: Nurse Practitioner

## 2022-04-05 MED ORDER — AMOXICILLIN-POT CLAVULANATE 875-125 MG PO TABS: 1.0000 | ORAL_TABLET | Freq: Two times a day (BID) | ORAL | 0 refills | Status: DC

## 2022-04-07 ENCOUNTER — Ambulatory Visit (HOSPITAL_COMMUNITY): Payer: BC Managed Care – PPO

## 2022-04-09 ENCOUNTER — Ambulatory Visit (HOSPITAL_COMMUNITY): Payer: BC Managed Care – PPO

## 2022-04-12 ENCOUNTER — Ambulatory Visit (HOSPITAL_COMMUNITY): Payer: BC Managed Care – PPO

## 2022-04-13 ENCOUNTER — Ambulatory Visit (INDEPENDENT_AMBULATORY_CARE_PROVIDER_SITE_OTHER): Payer: BC Managed Care – PPO | Admitting: Psychology

## 2022-04-13 DIAGNOSIS — F418 Other specified anxiety disorders: Secondary | ICD-10-CM | POA: Diagnosis not present

## 2022-04-13 DIAGNOSIS — F3289 Other specified depressive episodes: Secondary | ICD-10-CM | POA: Diagnosis not present

## 2022-04-13 NOTE — Progress Notes (Signed)
[facility] Counselor/Therapist Progress Note  Patient ID: Samantha Romero, MRN: [id number]   Date: 04/13/22  Time Spent: 5:03  pm - 5:54 pm : 51  Minutes  Treatment Type: Individual Therapy.  Reported Symptoms: Depression and Anxiety.   Mental Status Exam: Appearance:  Fairly Groomed     Behavior: Appropriate  Motor: Normal  Speech/Language:  Clear and Coherent  Affect: Depressed  Mood: depressed  Thought process: normal  Thought content:   WNL  Sensory/Perceptual disturbances:   WNL  Orientation: oriented to person, place, time/date, and situation  Attention: Fair  Concentration: Fair  Memory: Abanda of knowledge:  Good  Insight:   Good  Judgment:  Good  Impulse Control: Good   Risk Assessment: Danger to Self:  No Self-injurious Behavior: No Danger to Others: No Duty to Warn:no Physical Aggression / Violence:No  Access to Firearms a concern: No  Gang Involvement:No   Subjective:   Samantha Romero participated from home, via video and consented to treatment. Therapist participated from home office. We met online due to [place] pandemic. Samantha Romero reviewed the events of the past week. We reviewed numerous treatment approaches including CBT, BA, Problem Solving, and Solution focused therapy. Psych-education regarding the Shantil's diagnosis of Other specified anxiety disorders was provided during the session. We discussed Samantha Romero's goals treatment goals which include to maintain medication regimen, manage overall mood, improve lethargy, engage in self-care consistently, process past events, manage interpersonal stressors, increasing socializing, build a day-to-day routine, improve coping skills,  communicate feelings more consistently, boundaries for self and others, managing rumination, improving frustration tolerance.  Samantha Romero provided verbal approval of the treatment plan.   Interventions: Goal setting and psycho-education.   Diagnosis:    Other specified anxiety disorders  Psychiatric Treatment: Yes , Deloria Lair - NP [facility] (starting 04/14/22).    Treatment Plan:  Client Abilities/Strengths Samantha Romero is well spoken, intelligent, and forthcoming.   Support System: Family, friends  Client Treatment Preferences Outpatient Therapy.   Client Statement of Needs Samantha Romero would like to maintain medication regimen, manage overall mood, improve lethargy, engage in self-care consistently, process past events, manage interpersonal stressors, increasing socializing, build a day-to-day routine, improve coping skills,  communicate feelings more consistently, boundaries for self and others, managing rumination, improving frustration tolerance.    Treatment Level Weekly  Symptoms  Anxiety: Nervous, Anxious, on Edge, Control/stop worrying, Worry too much - different things, Trouble relaxing, Restless, Easily annoyed or irritable, Afraid - awful might happen. (Status: maintained)  Depression: Decreased Interest, Down, Depressed, Hopeless, Altered sleeping, Tired, decreased energy, Change in appetite, Feeling bad or failure about yourself, Trouble concentrating, Moving slowly or fidgety/restless. (Status: maintained)   Goals:   Damien experiences symptoms of depression and anxiety.    Target Date: 04/14/2023 Frequency: Weekly  Progress: 0 Modality: individual    Therapist will provide referrals for additional resources as appropriate.  Therapist will provide psycho-education regarding Neyda's diagnosis and corresponding treatment approaches and interventions. Licensed Clinical Social Worker, [place], LCSW will support the patient's ability to achieve the goals identified. will employ CBT, BA, Problem-solving, Solution Focused, Mindfulness,  coping skills, & other evidenced-based practices will be used to promote progress towards healthy functioning to help manage decrease symptoms associated with her  diagnosis.   Reduce overall level, frequency, and intensity of the feelings of depression & anxiety as evidenced by decreased overall symptoms from 6 to 7 days/week to 0 to 1 days/week per client report  for at least 3 consecutive months. Verbally express understanding of the relationship between feelings of depression, anxiety and their impact on thinking patterns and behaviors. Verbalize an understanding of the role that distorted thinking plays in creating fears, excessive worry, and ruminations.    (Ivylynn participated in the creation of the treatment plan)    Buena Irish, LCSW

## 2022-04-14 ENCOUNTER — Ambulatory Visit (INDEPENDENT_AMBULATORY_CARE_PROVIDER_SITE_OTHER): Payer: Self-pay | Admitting: Adult Health

## 2022-04-14 ENCOUNTER — Ambulatory Visit (HOSPITAL_COMMUNITY): Payer: BC Managed Care – PPO

## 2022-04-14 ENCOUNTER — Ambulatory Visit (INDEPENDENT_AMBULATORY_CARE_PROVIDER_SITE_OTHER): Payer: BC Managed Care – PPO

## 2022-04-14 ENCOUNTER — Ambulatory Visit
Admission: RE | Admit: 2022-04-14 | Discharge: 2022-04-14 | Disposition: A | Discharge status: Home / Self Care | Payer: BC Managed Care – PPO | Source: Ambulatory Visit | Attending: Physician Assistant | Admitting: Physician Assistant

## 2022-04-14 VITALS — BP 149/92 | HR 110 | Temp 98.0°F | Resp 20

## 2022-04-14 DIAGNOSIS — R052 Subacute cough: Secondary | ICD-10-CM | POA: Diagnosis not present

## 2022-04-14 DIAGNOSIS — R5383 Other fatigue: Secondary | ICD-10-CM | POA: Diagnosis not present

## 2022-04-14 DIAGNOSIS — R531 Weakness: Secondary | ICD-10-CM | POA: Diagnosis not present

## 2022-04-14 DIAGNOSIS — F489 Nonpsychotic mental disorder, unspecified: Secondary | ICD-10-CM

## 2022-04-14 DIAGNOSIS — R9431 Abnormal electrocardiogram [ECG] [EKG]: Secondary | ICD-10-CM

## 2022-04-14 DIAGNOSIS — R000 Tachycardia, unspecified: Secondary | ICD-10-CM

## 2022-04-14 DIAGNOSIS — R0602 Shortness of breath: Secondary | ICD-10-CM

## 2022-04-14 LAB — POCT URINALYSIS DIP (MANUAL ENTRY)
Bilirubin, UA: NEGATIVE
Blood, UA: NEGATIVE
Glucose, UA: NEGATIVE mg/dL
Ketones, POC UA: NEGATIVE mg/dL
Leukocytes, UA: NEGATIVE
Nitrite, UA: NEGATIVE
Protein Ur, POC: NEGATIVE mg/dL
Spec Grav, UA: 1.025 (ref 1.010–1.025)
Urobilinogen, UA: 0.2 E.U./dL
pH, UA: 7 (ref 5.0–8.0)

## 2022-04-14 MED ORDER — PROMETHAZINE-DM 6.25-15 MG/5ML PO SYRP: 5.0000 mL | ORAL_SOLUTION | Freq: Every evening | ORAL | 0 refills | Status: DC | PRN

## 2022-04-14 MED ORDER — PREDNISONE 20 MG PO TABS: 40.0000 mg | ORAL_TABLET | Freq: Every day | ORAL | 0 refills | Status: AC

## 2022-04-14 NOTE — ED Provider Notes (Signed)
[facility]    CSN: [id number] Arrival date & time: 04/14/22  1342      History   Chief Complaint Chief Complaint  Patient presents with   Cough    HPI Samantha Romero is a 47 y.o. female.   Patient presents today with a several week history of shortness of breath and cough.  She was seen by her primary care provider around Christmas at which point she was diagnosed with an asthma exacerbation.  She was started on a course of prednisone which provided improvement of symptoms but not resolution.  She was seen again several weeks ago and given a prescription for Augmentin.  She reports completing course which did provide relief of symptoms but not resolution.  She reports being hospitalized when she was a teenager for asthma but has not required intubation.  She denies any fever, unintentional weight gain, nausea, vomiting, diarrhea.  She does have a history of heart failure but denies any sudden weight gain or leg swelling.  She denies additional antibiotic or steroid use in the past 90 days.  Reports that now she is just feeling very poorly.  She has difficulty sleeping at night as a result of the cough and feels weak.  She is having difficulty with daily activities as a result of symptoms.  She is confident that she is not pregnant.    Past Medical History:  Diagnosis Date   Anxiety    panic attacks   Asthma    CHF (congestive heart failure), NYHA class II, acute, systolic ([place]) 0/93/8182   Depression    Fibromyalgia    Hypertension    no meds currently   No pertinent past medical history    Pneumonia    hx of    Sinus problem     Patient Active Problem List   Diagnosis Date Noted   Left hip pain 09/30/2020   Environmental and seasonal allergies 07/24/2019   Asthma 10/06/2018   Hypertensive urgency 10/06/2018   GERD (gastroesophageal reflux disease) 10/06/2018   Depression with anxiety 10/06/2018   CHF (congestive heart failure), NYHA class II, acute, systolic  ([place]) 99/37/1696   Shortness of breath    Cardiomegaly    Snoring 10/04/2018   Essential hypertension 08/14/2018   Moderate persistent asthma with acute exacerbation 08/14/2018   Fibromyalgia 11/18/2016   Class 3 severe obesity due to excess calories without serious comorbidity with body mass index (BMI) of 45.0 to 49.9 in adult ([place]) 11/18/2016   Adjustment reaction with anxiety and depression 11/18/2016   Chronic bilateral low back pain without sciatica 09/22/2015   Myofascial pain 09/22/2015   Lapband port leaking-REVISED May 2013 09/10/2011   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 07/21/2011    Past Surgical History:  Procedure Laterality Date   ESOPHAGOGASTRODUODENOSCOPY N/A 11/26/2014   Procedure: ESOPHAGOGASTRODUODENOSCOPY (EGD);  Surgeon: Alphonsa Overall, MD;  Location: [facility];  Service: General;  Laterality: N/A;   GASTRIC BANDING PORT REVISION  08/24/11   LAPAROSCOPIC GASTRIC BANDING  12/26/2007   LAPAROSCOPIC LYSIS OF ADHESIONS N/A 01/06/2015   Procedure: LAPAROSCOPIC LYSIS OF ADHESIONS;  Surgeon: Johnathan Hausen, MD;  Location: [facility];  Service: General;  Laterality: N/A;   LAPAROSCOPIC REPAIR AND REMOVAL OF GASTRIC BAND N/A 01/06/2015   Procedure: LAPAROSCOPIC RESIGHTING OF GASTRIC Band PORT;  Surgeon: Johnathan Hausen, MD;  Location: [facility];  Service: General;  Laterality: N/A;   NO PAST SURGERIES      OB History   No obstetric history on file.  Home Medications    Prior to Admission medications   Medication Sig Start Date End Date Taking? Authorizing Provider  predniSONE (DELTASONE) 20 MG tablet Take 2 tablets (40 mg total) by mouth daily for 5 days. 04/14/22 04/19/22 Yes Mariabelen Pressly K, PA-C  promethazine-dextromethorphan (PROMETHAZINE-DM) 6.25-15 MG/5ML syrup Take 5 mLs by mouth at bedtime as needed for cough. 04/14/22  Yes Analiza Cowger K, PA-C  albuterol (PROVENTIL) (2.5 MG/3ML) 0.083% nebulizer solution Take 3 mLs (2.5 mg total) by nebulization every 6 (six) hours as needed  for wheezing or shortness of breath. 03/24/22   [name], FNP  albuterol (VENTOLIN HFA) 108 (90 Base) MCG/ACT inhaler Inhale 2 puffs into the lungs every 6 (six) hours as needed for wheezing or shortness of breath. 03/24/22   [name], FNP  ALPRAZolam Duanne Moron) 1 MG tablet Take 1 tablet (1 mg total) by mouth 2 (two) times daily as needed for anxiety. 03/12/22   [name], FNP  azelastine (ASTELIN) 0.1 % nasal spray Place 2 sprays into both nostrils 2 (two) times daily. 10/27/20   [name], FNP  buPROPion (WELLBUTRIN XL) 150 MG 24 hr tablet Take 3 tablets (450 mg total) by mouth daily. 01/06/22   [name], FNP  carvedilol (COREG) 25 MG tablet TAKE 1 TABLET (25 MG TOTAL) BY MOUTH TWICE A DAY WITH MEALS 01/04/22   Belva Crome, MD  DULoxetine (CYMBALTA) 60 MG capsule TAKE 1 CAPSULE BY MOUTH EVERY DAY 05/05/21   [name], FNP  empagliflozin (JARDIANCE) 10 MG TABS tablet TAKE ONE TABLET BY MOUTH DAILY BEFORE BREAKFAST 01/04/22   Belva Crome, MD  Fluticasone-Salmeterol [phone] MCG/ACT AEPB Inhale 1 puff into the lungs in the morning and at bedtime. 01/06/22   [name], FNP  furosemide (LASIX) 80 MG tablet Take 1 tablet (80 mg total) by mouth daily. 01/04/22   Belva Crome, MD  levocetirizine (XYZAL) 5 MG tablet TAKE 1 TABLET BY MOUTH EVERY DAY IN THE EVENING 01/26/21   [name], FNP  levonorgestrel (MIRENA, 52 MG,) 20 MCG/DAY IUD  03/11/16   [provider]  lisdexamfetamine (VYVANSE) 30 MG capsule Take 1 capsule (30 mg total) by mouth daily. 03/24/22   [name], FNP  meloxicam (MOBIC) 15 MG tablet TAKE 1 TABLET BY MOUTH EVERY DAY AS NEEDED FOR PAIN 08/23/21   Lilia Argue R, DO  montelukast (SINGULAIR) 10 MG tablet TAKE 1 TABLET BY MOUTH EVERYDAY AT BEDTIME 03/24/22   [name], FNP  omeprazole (PRILOSEC) 40 MG capsule TAKE 1 CAPSULE BY MOUTH EVERY DAY Patient not taking: Reported on 03/24/2022 01/06/22   [name], FNP  sacubitril-valsartan  (ENTRESTO) 97-103 MG Take 1 tablet by mouth 2 (two) times daily. 01/04/22   Belva Crome, MD  spironolactone (ALDACTONE) 25 MG tablet Take 1 tablet (25 mg total) by mouth daily. 01/04/22   Belva Crome, MD  traZODone (DESYREL) 50 MG tablet TAKE 1 TABLET BY MOUTH EVERYDAY AT BEDTIME Patient taking differently: Take 50 mg by mouth as needed for sleep. 05/18/21   [name], FNP  mometasone-formoterol (DULERA) 200-5 MCG/ACT AERO Inhale 2 puffs into the lungs 2 (two) times a day. 10/19/18 12/17/19  Kennith Gain, MD    Family History Family History  Problem Relation Age of Onset   Ulcers Mother    Hypertension Mother    Cancer Maternal Uncle        lung   Cancer Maternal Grandfather        lung  and back    Social History Social History   Tobacco Use   Smoking status: Never   Smokeless tobacco: Never  Vaping Use   Vaping Use: Never used  Substance Use Topics   Alcohol use: Never   Drug use: Never     Allergies   Grass extracts [gramineae pollens] and Shellfish allergy   Review of Systems Review of Systems  Constitutional:  Positive for activity change and fatigue. Negative for appetite change and fever.  HENT:  Positive for congestion. Negative for sinus pressure, sneezing and sore throat.   Respiratory:  Positive for cough and shortness of breath.   Cardiovascular:  Negative for chest pain.  Gastrointestinal:  Negative for abdominal pain, diarrhea, nausea and vomiting.  Neurological:  Positive for weakness (generalized). Negative for dizziness, light-headedness and headaches.     Physical Exam Triage Vital Signs ED Triage Vitals  Enc Vitals Group     BP 04/14/22 1410 (!) 164/93     Pulse Rate 04/14/22 1410 (!) 117     Resp 04/14/22 1410 20     Temp 04/14/22 1410 98 F (36.7 C)     Temp Source 04/14/22 1410 Oral     SpO2 04/14/22 1410 100 %     Weight --      Height --      Head Circumference --      Peak Flow --      Pain Score 04/14/22 1411 0      Pain Loc --      Pain Edu? --      Excl. in GC? --    No data found.  Updated Vital Signs BP (!) 149/92 (BP Location: Left Arm)   Pulse (!) 110   Temp 98 F (36.7 C) (Oral)   Resp 20   LMP  (LMP Unknown)   SpO2 100%   Visual Acuity Right Eye Distance:   Left Eye Distance:   Bilateral Distance:    Right Eye Near:   Left Eye Near:    Bilateral Near:     Physical Exam Vitals reviewed.  Constitutional:      General: She is awake. She is not in acute distress.    Appearance: Normal appearance. She is well-developed. She is not ill-appearing.     Comments: Very pleasant female appears stated age in no acute distress sitting comfortably in exam room  HENT:     Head: Normocephalic and atraumatic.     Right Ear: Tympanic membrane, ear canal and external ear normal. Tympanic membrane is not erythematous or bulging.     Left Ear: Tympanic membrane, ear canal and external ear normal. Tympanic membrane is not erythematous or bulging.     Nose:     Right Sinus: No maxillary sinus tenderness or frontal sinus tenderness.     Left Sinus: No maxillary sinus tenderness or frontal sinus tenderness.     Mouth/Throat:     Pharynx: Uvula midline. No oropharyngeal exudate or posterior oropharyngeal erythema.  Cardiovascular:     Rate and Rhythm: Regular rhythm. Tachycardia present.     Heart sounds: Normal heart sounds, S1 normal and S2 normal. No murmur heard. Pulmonary:     Effort: Pulmonary effort is normal.     Breath sounds: Normal breath sounds. No wheezing, rhonchi or rales.     Comments: Clear to auscultation bilaterally Musculoskeletal:     Right lower leg: No edema.     Left lower leg: No edema.  Psychiatric:  Behavior: Behavior is cooperative.      UC Treatments / Results  Labs (all labs ordered are listed, but only abnormal results are displayed) Labs Reviewed  CBC WITH DIFFERENTIAL/PLATELET  COMPREHENSIVE METABOLIC PANEL  TSH  POCT URINALYSIS DIP (MANUAL  ENTRY)    EKG   Radiology DG Chest 2 View  Result Date: 04/14/2022 CLINICAL DATA:  Cough and shortness of breath EXAM: CHEST - 2 VIEW COMPARISON:  10/08/2020 FINDINGS: Gastric band noted in the left upper quadrant of the abdomen. The heart size and mediastinal contours are unremarkable. Unchanged asymmetric elevation of the right hemidiaphragm. There is no pleural effusion or edema identified. No airspace opacities identified. IMPRESSION: No acute cardiopulmonary abnormalities. Electronically Signed   By: Kerby Moors M.D.   On: 04/14/2022 14:54    Procedures Procedures (including critical care time)  Medications Ordered in UC Medications - No data to display  Initial Impression / Assessment and Plan / UC Course  I have reviewed the triage vital signs and the nursing notes.  Pertinent labs & imaging results that were available during my care of the patient were reviewed by me and considered in my medical decision making (see chart for details).     Patient is well-appearing, afebrile, nontoxic.  She was tachycardic on intake.  EKG was obtained that showed sinus tachycardia with ventricular rate of 103 bpm with nonspecific ST changes in lead III as well as T wave inversion compared to 10/06/2021 tracing.  We discussed that given her tachycardia and EKG abnormalities I would recommend going to the emergency room.  Patient is confident that her symptoms are related to her asthma and will go home and take a dose of steroids and see if this provides some relief of symptoms.  If she continues to have heart rate above 100 as well as shortness of breath that she will go to the emergency room later tonight; she will monitor her pulse with her Apple Watch and if this is above 120 bpm or she develops any additional symptoms she will go to the emergency room immediately.  Chest x-ray was obtained that showed no acute cardiopulmonary disease.  Lab work is obtained including CBC, CMP, TSH and is pending.   I do believe that she is most likely experiencing asthma exacerbation.  Prednisone burst was sent to pharmacy we discussed that she is not to take NSAIDs with this medication.  Promethazine DM was provided for cough with instruction to take this only at night given she also takes Wellbutrin.  She is to use over-the-counter medication for symptom management.  Recommended that she rest and drink plenty of fluid.  Encouraged to follow-up closely with her primary care if her heart rate decreases and her symptoms improve indicating she does not require emergency evaluation.  We discussed at length that if she has any worsening or changing symptoms she must go to the ER immediately to which she expressed understanding.  All questions were answered to patient satisfaction.  Work excuse note provided.  Final Clinical Impressions(s) / UC Diagnoses   Final diagnoses:  Shortness of breath  Subacute cough  Generalized weakness  Fatigue, unspecified type  Sinus tachycardia  Nonspecific abnormal electrocardiogram (ECG) (EKG)     Discharge Instructions      I suspect that your symptoms are mostly related to your asthma but given your heart rate is elevated with EKG changes and you are feeling short of breath I think the safest thing to do is to go to  the emergency room for further evaluation and management.     ED Prescriptions     Medication Sig Dispense Auth. Provider   predniSONE (DELTASONE) 20 MG tablet Take 2 tablets (40 mg total) by mouth daily for 5 days. 10 tablet Pasha Broad K, PA-C   promethazine-dextromethorphan (PROMETHAZINE-DM) 6.25-15 MG/5ML syrup Take 5 mLs by mouth at bedtime as needed for cough. 30 mL Lucely Leard K, PA-C      PDMP not reviewed this encounter.   Terrilee Croak, PA-C 04/14/22 1559

## 2022-04-14 NOTE — Progress Notes (Signed)
Patient no show appointment. ? ?

## 2022-04-14 NOTE — ED Triage Notes (Addendum)
Pt c/o cough, SHOB, body aches, nasal drainage, weakness-sx started 03/22/2022-states she was seen by PCP completed prednisone and later abx-states SHOB, dry cough cont'd-was advised by PCP to come to [facility]-steady gait

## 2022-04-14 NOTE — Discharge Instructions (Signed)
I suspect that your symptoms are mostly related to your asthma but given your heart rate is elevated with EKG changes and you are feeling short of breath I think the safest thing to do is to go to the emergency room for further evaluation and management.

## 2022-04-15 LAB — CBC WITH DIFFERENTIAL/PLATELET
Basophils Absolute: 0 10*3/uL (ref 0.0–0.2)
Basos: 0 %
EOS (ABSOLUTE): 0.1 10*3/uL (ref 0.0–0.4)
Eos: 2 %
Hematocrit: 40.3 % (ref 34.0–46.6)
Hemoglobin: 12.8 g/dL (ref 11.1–15.9)
Immature Grans (Abs): 0 10*3/uL (ref 0.0–0.1)
Immature Granulocytes: 0 %
Lymphocytes Absolute: 2.3 10*3/uL (ref 0.7–3.1)
Lymphs: 35 %
MCH: 27.8 pg (ref 26.6–33.0)
MCHC: 31.8 g/dL (ref 31.5–35.7)
MCV: 87 fL (ref 79–97)
Monocytes Absolute: 0.4 10*3/uL (ref 0.1–0.9)
Monocytes: 5 %
Neutrophils Absolute: 3.9 10*3/uL (ref 1.4–7.0)
Neutrophils: 58 %
Platelets: 272 10*3/uL (ref 150–450)
RBC: 4.61 x10E6/uL (ref 3.77–5.28)
RDW: 12.9 % (ref 11.7–15.4)
WBC: 6.7 10*3/uL (ref 3.4–10.8)

## 2022-04-15 LAB — COMPREHENSIVE METABOLIC PANEL
ALT: 62 IU/L — ABNORMAL HIGH (ref 0–32)
AST: 32 IU/L (ref 0–40)
Albumin/Globulin Ratio: 1.6 (ref 1.2–2.2)
Albumin: 4.1 g/dL (ref 3.9–4.9)
Alkaline Phosphatase: 135 IU/L — ABNORMAL HIGH (ref 44–121)
BUN/Creatinine Ratio: 13 (ref 9–23)
BUN: 9 mg/dL (ref 6–24)
Bilirubin Total: 0.8 mg/dL (ref 0.0–1.2)
CO2: 24 mmol/L (ref 20–29)
Calcium: 9.2 mg/dL (ref 8.7–10.2)
Chloride: 106 mmol/L (ref 96–106)
Creatinine, Ser: 0.72 mg/dL (ref 0.57–1.00)
Globulin, Total: 2.5 g/dL (ref 1.5–4.5)
Glucose: 84 mg/dL (ref 70–99)
Potassium: 3.5 mmol/L (ref 3.5–5.2)
Sodium: 145 mmol/L — ABNORMAL HIGH (ref 134–144)
Total Protein: 6.6 g/dL (ref 6.0–8.5)
eGFR: 105 mL/min/{1.73_m2} (ref >59)

## 2022-04-15 LAB — TSH: TSH: 1.04 u[IU]/mL (ref 0.450–4.500)

## 2022-04-16 ENCOUNTER — Ambulatory Visit (HOSPITAL_COMMUNITY): Payer: BC Managed Care – PPO

## 2022-04-18 ENCOUNTER — Other Ambulatory Visit: Payer: Self-pay | Admitting: Nurse Practitioner

## 2022-04-18 DIAGNOSIS — F418 Other specified anxiety disorders: Secondary | ICD-10-CM

## 2022-04-19 ENCOUNTER — Ambulatory Visit (HOSPITAL_COMMUNITY): Payer: BC Managed Care – PPO

## 2022-04-19 ENCOUNTER — Telehealth: Payer: Self-pay

## 2022-04-19 ENCOUNTER — Ambulatory Visit: Payer: Self-pay | Admitting: Nurse Practitioner

## 2022-04-19 ENCOUNTER — Encounter: Payer: Self-pay | Admitting: Nurse Practitioner

## 2022-04-19 VITALS — BP 136/84 | HR 95 | Temp 98.2°F | Ht 62.0 in | Wt 320.0 lb

## 2022-04-19 DIAGNOSIS — I110 Hypertensive heart disease with heart failure: Secondary | ICD-10-CM

## 2022-04-19 DIAGNOSIS — R051 Acute cough: Secondary | ICD-10-CM

## 2022-04-19 DIAGNOSIS — F418 Other specified anxiety disorders: Secondary | ICD-10-CM

## 2022-04-19 DIAGNOSIS — Z1211 Encounter for screening for malignant neoplasm of colon: Secondary | ICD-10-CM

## 2022-04-19 DIAGNOSIS — I5020 Unspecified systolic (congestive) heart failure: Secondary | ICD-10-CM

## 2022-04-19 DIAGNOSIS — I5021 Acute systolic (congestive) heart failure: Secondary | ICD-10-CM

## 2022-04-19 DIAGNOSIS — R0609 Other forms of dyspnea: Secondary | ICD-10-CM

## 2022-04-19 MED ORDER — PROMETHAZINE-DM 6.25-15 MG/5ML PO SYRP: 5.0000 mL | ORAL_SOLUTION | Freq: Every evening | ORAL | 0 refills | Status: DC | PRN

## 2022-04-19 MED ORDER — ALPRAZOLAM 1 MG PO TABS: 1.0000 mg | ORAL_TABLET | Freq: Two times a day (BID) | ORAL | 0 refills | Status: DC | PRN

## 2022-04-19 NOTE — Patient Instructions (Signed)
Asthma, Adult  Asthma is a condition that causes swelling and narrowing of the airways. These are the passages that lead from the nose and mouth down into the lungs. When asthma symptoms get worse it is called an asthma attack or flare. This can make it hard to breathe. Asthma flares can range from minor to life-threatening. There is no cure for asthma, but medicines and lifestyle changes can help to control it. What are the causes? It is not known exactly what causes asthma, but certain things can cause asthma symptoms to get worse (triggers). What can trigger an asthma attack? Cigarette smoke. Mold. Dust. Your pet's skin flakes (dander). Cockroaches. Pollen. Air pollution (like household cleaners, wood smoke, smog, or chemical odors). What are the signs or symptoms? Trouble breathing (shortness of breath). Coughing. Making high-pitched whistling sounds when you breathe, most often when you breathe out (wheezing). Chest tightness. Tiredness with little activity. Poor exercise tolerance. How is this treated? Controller medicines that help prevent asthma symptoms. Fast-acting reliever or rescue medicines. These give short-term relief of asthma symptoms. Allergy medicines if your attacks are brought on by allergens. Medicines to help control the body's defense (immune) system. Staying away from the things that cause asthma attacks. Follow these instructions at home: Avoiding triggers in your home Do not allow anyone to smoke in your home. Limit use of fireplaces and wood stoves. Get rid of pests (such as roaches and mice) and their droppings. Keep your home clean. Clean your floors. Dust regularly. Use cleaning products that do not smell. Wash bed sheets and blankets every week in hot water. Dry them in a dryer. Have someone vacuum when you are not home. Change your heating and air conditioning filters often. Use blankets that are made of polyester or cotton. General  instructions Take over-the-counter and prescription medicines only as told by your doctor. Do not smoke or use any products that contain nicotine or tobacco. If you need help quitting, ask your doctor. Stay away from secondhand smoke. Avoid doing things outdoors when allergen counts are high and when air quality is low. Warm up before you exercise. Take time to cool down after exercise. Use a peak flow meter as told by your doctor. A peak flow meter is a tool that measures how well your lungs are working. Keep track of the peak flow meter's readings. Write them down. Follow your asthma action plan. This is a written plan for taking care of your asthma and treating your attacks. Make sure you get all the shots (vaccines) that your doctor recommends. Ask your doctor about a flu shot and a pneumonia shot. Keep all follow-up visits. Contact a doctor if: You have wheezing, shortness of breath, or a cough even while taking medicine to prevent attacks. The mucus you cough up (sputum) is thicker than usual. The mucus you cough up changes from clear or white to yellow, green, gray, or is bloody. You have problems from the medicine you are taking, such as: A rash. Itching. Swelling. Trouble breathing. You need reliever medicines more than 2-3 times a week. Your peak flow reading is still at 50-79% of your personal best after following the action plan for 1 hour. You have a fever. Get help right away if: You seem to be worse and are not responding to medicine during an asthma attack. You are short of breath even at rest. You get short of breath when doing very little activity. You have trouble eating, drinking, or talking. You have chest   pain or tightness. You have a fast heartbeat. Your lips or fingernails start to turn blue. You are light-headed or dizzy, or you faint. Your peak flow is less than 50% of your personal best. You feel too tired to breathe normally. These symptoms may be an  emergency. Get help right away. Call 911. Do not wait to see if the symptoms will go away. Do not drive yourself to the hospital. Summary Asthma is a long-term (chronic) condition in which the airways get tight and narrow. An asthma attack can make it hard to breathe. Asthma cannot be cured, but medicines and lifestyle changes can help control it. Make sure you understand how to avoid triggers and how and when to use your medicines. Avoid things that can cause allergy symptoms (allergens). These include animal skin flakes (dander) and pollen from trees or grass. Avoid things that pollute the air. These may include household cleaners, wood smoke, smog, or chemical odors. This information is not intended to replace advice given to you by your health care provider. Make sure you discuss any questions you have with your health care provider. Document Revised: 12/22/2020 Document Reviewed: 12/22/2020 Elsevier Patient Education  2023 [employer]  

## 2022-04-19 NOTE — Telephone Encounter (Signed)
Transition Care Management Follow-up Telephone Call Date of discharge and from where: 04/14/2022 [facility]  How have you been since you were released from the hospital? Pt states she is doing okay.  Any questions or concerns? No  Items Reviewed: Did the pt receive and understand the discharge instructions provided? Yes  Medications obtained and verified? Yes  Other? No  Any new allergies since your discharge? No  Dietary orders reviewed? Yes Do you have support at home? Yes   Home Care and Equipment/Supplies: Were home health services ordered? no If so, what is the name of the agency? N/a  Has the agency set up a time to come to the patient's home? no Were any new equipment or medical supplies ordered?  No What is the name of the medical supply agency? N/a Were you able to get the supplies/equipment? no Do you have any questions related to the use of the equipment or supplies? No  Functional Questionnaire: (I = Independent and D = Dependent) ADLs: i  Bathing/Dressing- i  Meal Prep- i  Eating- i  Maintaining continence- i  Transferring/Ambulation- i  Managing Meds- i  Follow up appointments reviewed:  PCP Hospital f/u appt confirmed? Yes  Scheduled to see [name] on 04/19/2022  @ [facility] f/u appt confirmed? No  Scheduled to see n/a on n/a @ n/a. Are transportation arrangements needed? No  If their condition worsens, is the pt aware to call PCP or go to the Emergency Dept.? Yes Was the patient provided with contact information for the PCP's office or ED? Yes Was to pt encouraged to call back with questions or concerns? Yes

## 2022-04-19 NOTE — Progress Notes (Addendum)
I,[name],acting as a Education administrator for [employer], FNP.,have documented all relevant documentation on the behalf of [name], FNP,as directed by  [name], FNP while in the presence of [name], [name].  Subjective:     Patient ID: Samantha Romero , female    DOB: 1975/04/13 , 47 y.o.   MRN: [id number]   Chief Complaint  Patient presents with   Asthma    HPI  She is here today for f/u urgent care visit on 04/14/2022. She had been having a cough that was difficult to get the mucous out. The cough was dry without production. She was having headaches. She had a chest xray, labs were normal. Heart rate was elevated - but had been running around and anxious. She also had an EKG which was normal. She was recommended to go to ER. If she took the medications prescribed and stop having shortness of breath. She continues to have shortness of breath and body aches. She continues to be concerned about having a pulmonary embolism.   She reports having asthma since age 18 y/o. She has her albuterol inhaler and solution. She has also taken an antibiotic. Continued to have shortness of breath and body ache. She was taking mucinex but had passed the amount of the days. She is taking singular. She is not taking her fluticasone/solumeterol. This episode occurred since being exposed to a family member with cigarette smoke. She does continue to cough at night. She is also trying to get her breathing back up.   She is getting rescheduled with the psychiatrist since she was sick on the day of her appt.   Wt Readings from Last 3 Encounters: 04/19/22 : (!) 320 lb (145.2 kg) 03/24/22 : (!) 316 lb 9.6 oz (143.6 kg) 02/25/22 : (!) 323 lb 6.6 oz (146.7 kg)        Past Medical History:  Diagnosis Date   Anxiety    panic attacks   Asthma    CHF (congestive heart failure), NYHA class II, acute, systolic ([place]) [id number]   Depression    Fibromyalgia    Hypertension    no meds currently   No pertinent  past medical history    Pneumonia    hx of    Sinus problem      Family History  Problem Relation Age of Onset   Ulcers Mother    Hypertension Mother    Cancer Maternal Uncle        lung   Cancer Maternal Grandfather        lung and back     Current Outpatient Medications:    albuterol (PROVENTIL) (2.5 MG/3ML) 0.083% nebulizer solution, Take 3 mLs (2.5 mg total) by nebulization every 6 (six) hours as needed for wheezing or shortness of breath., Disp: 150 mL, Rfl: 1   albuterol (VENTOLIN HFA) 108 (90 Base) MCG/ACT inhaler, Inhale 2 puffs into the lungs every 6 (six) hours as needed for wheezing or shortness of breath., Disp: 8 g, Rfl: 2   ALPRAZolam (XANAX) 1 MG tablet, Take 1 tablet (1 mg total) by mouth 2 (two) times daily as needed for anxiety., Disp: 20 tablet, Rfl: 0   azelastine (ASTELIN) 0.1 % nasal spray, Place 2 sprays into both nostrils 2 (two) times daily., Disp: 30 mL, Rfl: 5   buPROPion (WELLBUTRIN XL) 150 MG 24 hr tablet, Take 3 tablets (450 mg total) by mouth daily., Disp: 270 tablet, Rfl: 2   carvedilol (COREG) 25 MG tablet, TAKE 1  TABLET (25 MG TOTAL) BY MOUTH TWICE A DAY WITH MEALS, Disp: 180 tablet, Rfl: 3   DULoxetine (CYMBALTA) 60 MG capsule, TAKE 1 CAPSULE BY MOUTH EVERY DAY, Disp: 90 capsule, Rfl: 1   empagliflozin (JARDIANCE) 10 MG TABS tablet, TAKE ONE TABLET BY MOUTH DAILY BEFORE BREAKFAST, Disp: 90 tablet, Rfl: 3   Fluticasone-Salmeterol 232-14 MCG/ACT AEPB, Inhale 1 puff into the lungs in the morning and at bedtime., Disp: 1 each, Rfl: 5   furosemide (LASIX) 80 MG tablet, Take 1 tablet (80 mg total) by mouth daily., Disp: 90 tablet, Rfl: 3   levocetirizine (XYZAL) 5 MG tablet, TAKE 1 TABLET BY MOUTH EVERY DAY IN THE EVENING, Disp: 90 tablet, Rfl: 0   levonorgestrel (MIRENA, 52 MG,) 20 MCG/DAY IUD, , Disp: , Rfl:    lisdexamfetamine (VYVANSE) 30 MG capsule, Take 1 capsule (30 mg total) by mouth daily., Disp: 30 capsule, Rfl: 0   meloxicam (MOBIC) 15 MG tablet,  TAKE 1 TABLET BY MOUTH EVERY DAY AS NEEDED FOR PAIN, Disp: 30 tablet, Rfl: 0   montelukast (SINGULAIR) 10 MG tablet, TAKE 1 TABLET BY MOUTH EVERYDAY AT BEDTIME, Disp: 90 tablet, Rfl: 2   omeprazole (PRILOSEC) 40 MG capsule, TAKE 1 CAPSULE BY MOUTH EVERY DAY (Patient not taking: Reported on 03/24/2022), Disp: 90 capsule, Rfl: 1   promethazine-dextromethorphan (PROMETHAZINE-DM) 6.25-15 MG/5ML syrup, Take 5 mLs by mouth at bedtime as needed for cough., Disp: 118 mL, Rfl: 0   sacubitril-valsartan (ENTRESTO) 97-103 MG, Take 1 tablet by mouth 2 (two) times daily., Disp: 60 tablet, Rfl: 11   spironolactone (ALDACTONE) 25 MG tablet, Take 1 tablet (25 mg total) by mouth daily., Disp: 90 tablet, Rfl: 3   traZODone (DESYREL) 50 MG tablet, TAKE 1 TABLET BY MOUTH EVERYDAY AT BEDTIME (Patient taking differently: Take 50 mg by mouth as needed for sleep.), Disp: 30 tablet, Rfl: 2   Vitamin D, Ergocalciferol, (DRISDOL) 1.25 MG (50000 UNIT) CAPS capsule, Take 50,000 Units by mouth once a week., Disp: , Rfl:    Allergies  Allergen Reactions   Grass Extracts [Gramineae Pollens]     Nasal congestion, post nasal drip    Shellfish Allergy Itching    In throat only.     Review of Systems  Constitutional: Negative.   Respiratory:  Positive for shortness of breath.   Cardiovascular: Negative.   Gastrointestinal: Negative.   Neurological: Negative.   Psychiatric/Behavioral: Negative.       Today's Vitals   04/19/22 1424  BP: 136/84  Pulse: 95  Temp: 98.2 F (36.8 C)  TempSrc: Oral  Weight: (!) 320 lb (145.2 kg)  Height: 5' 2"$  (1.575 m)   Body mass index is 58.53 kg/m.   Objective:  Physical Exam Vitals reviewed.  Constitutional:      General: She is not in acute distress.    Appearance: Normal appearance. She is obese.  Cardiovascular:     Rate and Rhythm: Normal rate and regular rhythm.     Pulses: Normal pulses.     Heart sounds: Normal heart sounds. No murmur heard. Pulmonary:     Effort:  Pulmonary effort is normal. No respiratory distress.     Breath sounds: Normal breath sounds. No wheezing.  Musculoskeletal:     Right lower leg: No edema.     Left lower leg: No edema.  Skin:    General: Skin is warm and dry.     Capillary Refill: Capillary refill takes less than 2 seconds.  Neurological:  General: No focal deficit present.     Mental Status: She is alert and oriented to person, place, and time.     Cranial Nerves: No cranial nerve deficit.     Motor: No weakness.  Psychiatric:        Mood and Affect: Mood normal.        Behavior: Behavior normal.        Thought Content: Thought content normal.        Judgment: Judgment normal.         Assessment And Plan:     1. Dyspnea on exertion Comments: She reports this has been persistent, will check d-dimer - D-dimer, quantitative - Brain natriuretic peptide  2. Malignant hypertension with systolic CHF, NYHA class 2 ([place]) Comments: Will check BNP, she is to continue f/u with Cardiology as well. - Brain natriuretic peptide  3. Depression with anxiety Comments: Continue f/u with counseling and psychiatry - ALPRAZolam (XANAX) 1 MG tablet; Take 1 tablet (1 mg total) by mouth 2 (two) times daily as needed for anxiety.  Dispense: 20 tablet; Refill: 0  4. Colon cancer screening According to USPTF Colorectal cancer Screening guidelines. Cologuard is recommended every 3 years, starting at age 37 years. Order to exact sciences - Cologuard  5. Acute cough Comments: Unsure if dyspnea related to her cough. - promethazine-dextromethorphan (PROMETHAZINE-DM) 6.25-15 MG/5ML syrup; Take 5 mLs by mouth at bedtime as needed for cough.  Dispense: 118 mL; Refill: 0     Patient was given opportunity to ask questions. Patient verbalized understanding of the plan and was able to repeat key elements of the plan. All questions were answered to their satisfaction.  [name], FNP   I, [name], FNP, have reviewed all  documentation for this visit. The documentation on 04/19/22 for the exam, diagnosis, procedures, and orders are all accurate and complete.   IF YOU HAVE BEEN REFERRED TO A SPECIALIST, IT MAY TAKE 1-2 WEEKS TO SCHEDULE/PROCESS THE REFERRAL. IF YOU HAVE NOT HEARD FROM US/SPECIALIST IN TWO WEEKS, PLEASE GIVE [place] A CALL AT [phone] X [phone].   THE PATIENT IS ENCOURAGED TO PRACTICE SOCIAL DISTANCING DUE TO THE COVID-19 PANDEMIC.

## 2022-04-20 ENCOUNTER — Ambulatory Visit: Payer: BC Managed Care – PPO | Admitting: Psychology

## 2022-04-20 ENCOUNTER — Telehealth (HOSPITAL_COMMUNITY): Payer: Self-pay | Admitting: Psychiatry

## 2022-04-20 DIAGNOSIS — F3289 Other specified depressive episodes: Secondary | ICD-10-CM

## 2022-04-20 DIAGNOSIS — F418 Other specified anxiety disorders: Secondary | ICD-10-CM | POA: Diagnosis not present

## 2022-04-20 LAB — D-DIMER, QUANTITATIVE: D-DIMER: 0.28 mg/L FEU (ref 0.00–0.49)

## 2022-04-20 LAB — BRAIN NATRIURETIC PEPTIDE: BNP: 9.6 pg/mL (ref 0.0–100.0)

## 2022-04-20 NOTE — Progress Notes (Signed)
[facility] Counselor/Therapist Progress Note  Patient ID: Samantha Romero, MRN: [id number]   Date: 04/20/22  Time Spent: 2:10  pm - 3:00 pm : 50  Minutes  Treatment Type: Individual Therapy.  Reported Symptoms: Depression and Anxiety.   Mental Status Exam: Appearance:  Fairly Groomed     Behavior: Appropriate  Motor: Normal  Speech/Language:  Clear and Coherent  Affect: Depressed  Mood: depressed  Thought process: normal  Thought content:   WNL  Sensory/Perceptual disturbances:   WNL  Orientation: oriented to person, place, time/date, and situation  Attention: Fair  Concentration: Fair  Memory: [place] of knowledge:  Good  Insight:   Good  Judgment:  Good  Impulse Control: Good   Risk Assessment: Danger to Self:  No Self-injurious Behavior: No Danger to Others: No Duty to Warn:no Physical Aggression / Violence:No  Access to Firearms a concern: No  Gang Involvement:No   Subjective:   Samantha Romero participated from home, via video and consented to treatment. Therapist participated from home office. We met online due to [place] pandemic. Samantha Romero reviewed the events of the past week. Samantha Romero followed up regarding uncompleted release that will facilitate a referral for intensive outpatient treatment. Samantha Romero noted being email avoidant which led to the delay.  She noted the source of her email checking anxiety, which relates to her current position that she is being laid off from and receiving a severance package. She noted numerous work related stressors that has been difficult to manage. She noted significant avoidance regarding work. We worked on identifying the benefits of costs of this approach. Therapist provided psycho-education regarding the costs of avoidance including an increase in physiological and psychological tension. She noted her aunt Samantha Romero passing unexpectedly and Samantha Romero noted feeling grief as a result. Therapist validated Samantha Romero's feelings and  experience and provided supporitve therapy. Therapist provided Samantha Romero psycho-education regarding wise-mind approach and provided this via email for reference and review to be discussed in follow-up.     Interventions: CBT & psycho-education  Diagnosis:   Other specified anxiety disorders  Other depression  Psychiatric Treatment: Yes , Samantha Romero - NP [facility] (starting 2/24).    Treatment Plan:  Client Abilities/Strengths Samantha Romero is well spoken, intelligent, and forthcoming.   Support System: Family, friends  Client Treatment Preferences Outpatient Therapy.   Client Statement of Needs Samantha Romero would like to maintain medication regimen, manage overall mood, improve lethargy, engage in self-care consistently, process past events, manage interpersonal stressors, increasing socializing, build a day-to-day routine, improve coping skills,  communicate feelings more consistently, boundaries for self and others, managing rumination, improving frustration tolerance.    Treatment Level Weekly  Symptoms  Anxiety: Nervous, Anxious, on Edge, Control/stop worrying, Worry too much - different things, Trouble relaxing, Restless, Easily annoyed or irritable, Afraid - awful might happen. (Status: maintained)  Depression: Decreased Interest, Down, Depressed, Hopeless, Altered sleeping, Tired, decreased energy, Change in appetite, Feeling bad or failure about yourself, Trouble concentrating, Moving slowly or fidgety/restless. (Status: maintained)   Goals:   Samantha Romero experiences symptoms of depression and anxiety.    Target Date: 04/14/2023 Frequency: Weekly  Progress: 0 Modality: individual    Therapist will provide referrals for additional resources as appropriate.  Therapist will provide psycho-education regarding Samantha Romero's diagnosis and corresponding treatment approaches and interventions. Licensed Clinical Social Worker, [place], LCSW will support the  patient's ability to achieve the goals identified. will employ CBT, BA, Problem-solving, Solution Focused, Mindfulness,  coping skills, & other evidenced-based  practices will be used to promote progress towards healthy functioning to help manage decrease symptoms associated with her diagnosis.   Reduce overall level, frequency, and intensity of the feelings of depression & anxiety as evidenced by decreased overall symptoms from 6 to 7 days/week to 0 to 1 days/week per client report for at least 3 consecutive months. Verbally express understanding of the relationship between feelings of depression, anxiety and their impact on thinking patterns and behaviors. Verbalize an understanding of the role that distorted thinking plays in creating fears, excessive worry, and ruminations.    (Samantha Romero participated in the creation of the treatment plan)    Buena Irish, LCSW

## 2022-04-20 NOTE — Telephone Encounter (Signed)
D:  Samantha Irish, LCSW referred pt to [facility].  A:  Placed call and oriented pt.  [facility] scheduled for 04-22-22 @ 9:30 a.m.; start [facility] on 04-23-22.  Encouraged pt to verify her insurance benefits.  Inform Alene Mires.  R:  Pt receptive.

## 2022-04-21 ENCOUNTER — Ambulatory Visit (HOSPITAL_COMMUNITY): Payer: BC Managed Care – PPO

## 2022-04-22 ENCOUNTER — Other Ambulatory Visit (HOSPITAL_COMMUNITY): Payer: BC Managed Care – PPO | Attending: Psychiatry | Admitting: Psychiatry
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
## 2022-04-23 ENCOUNTER — Other Ambulatory Visit (HOSPITAL_COMMUNITY): Payer: BC Managed Care – PPO | Admitting: Licensed Clinical Social Worker

## 2022-04-23 ENCOUNTER — Ambulatory Visit (HOSPITAL_COMMUNITY): Payer: BC Managed Care – PPO

## 2022-04-23 DIAGNOSIS — J45909 Unspecified asthma, uncomplicated: Secondary | ICD-10-CM | POA: Insufficient documentation

## 2022-04-23 DIAGNOSIS — F909 Attention-deficit hyperactivity disorder, unspecified type: Secondary | ICD-10-CM | POA: Insufficient documentation

## 2022-04-23 DIAGNOSIS — F332 Major depressive disorder, recurrent severe without psychotic features: Secondary | ICD-10-CM | POA: Insufficient documentation

## 2022-04-23 DIAGNOSIS — I110 Hypertensive heart disease with heart failure: Secondary | ICD-10-CM | POA: Insufficient documentation

## 2022-04-23 DIAGNOSIS — M797 Fibromyalgia: Secondary | ICD-10-CM | POA: Insufficient documentation

## 2022-04-23 DIAGNOSIS — F411 Generalized anxiety disorder: Secondary | ICD-10-CM | POA: Insufficient documentation

## 2022-04-23 NOTE — Progress Notes (Signed)
Virtual Visit via Video Note   I connected with Samantha Romero on 04/23/22 at  9:00 AM EDT by a video enabled telemedicine application and verified that I am speaking with the correct person using two identifiers.   At orientation to the IOP program, Case Manager discussed the limitations of evaluation and management by telemedicine and the availability of in person appointments. The patient expressed understanding and agreed to proceed with virtual visits throughout the duration of the program.   Location:  Patient: Patient Home Provider: OPT [place] Office   History of Present Illness: MDD, recurrent, severe with anxious distress   Observations/Objective: Check In: Case Manager checked in with all participants to review discharge dates, insurance authorizations, work-related documents and needs from the treatment team regarding medications. Samantha Romero stated needs and engaged in discussion.    Initial Therapeutic Activity: Counselor facilitated a check-in with Samantha Romero to assess for safety, sobriety and medication compliance.  Counselor also inquired about Samantha Romero's current emotional ratings, as well as any significant changes in thoughts, feelings or behavior since previous check in.  Samantha Romero presented for session on time and was alert, oriented x5, with no evidence or self-report of active SI/HI or A/V H.  Samantha Romero reported compliance with medication and denied use of alcohol or illicit substances.  Samantha Romero reported scores of 8/10 for depression, 6/10 for anxiety, and 0/10 for anger/irritability.  Samantha Romero denied any recent outbursts or panic attacks.  Samantha Romero reported that a recent success was making the decision to begin MHIOP in order to address severe depression and anxiety she has been facing for a few years now.  Samantha Romero reported that a recent struggle was oversleeping today, and almost missing group since she was up most of the night worrying about what to expect on her first day.  Samantha Romero reported that her  goal this weekend is to attend a memorial service for her aunt that passed away.       Second Therapeutic Activity: Counselor covered topic of attachment styles today.  Counselor virtually shared a handout with the group on this topic which defined attachment styles as how people think about and behave in relationships.  Styles were broken down by category, including secure attachment where one believes close relationships are trustworthy, compared to insecure attachment (i.e. anxious, avoidant, or anxious-avoidant) where one is distrusting or worries about their bond with others.  Counselor inquired about which attachment style members most related to, how this has influenced their mental health/well-being, and whether they intend to begin making any changes.  Intervention was effective, as evidenced by Samantha Romero participating in discussion, and reporting that she most identified with the secure attachment style due to traits such as being committed to relationships with a healthy degree of independence, showing attention, affection, and appreciation toward others, in addition to demonstrating healthy conflict resolution skills when faced with challenges.  Samantha Romero reported that she feels supported in her current partnership with minimal issues, although there have been relationships in the past that aligned more with anxious insecure attachment.  Samantha Romero reported that she plans to take a closer look at her support network during treatment in order to identify any changes that could be made to improve overall boundaries and communication style.    Third Therapeutic Activity: Psycho-educational portion of group was provided by Samantha Romero, Mudlogger of community education with [employer].  Samantha Romero provided information on history of her local agency, mission statement, and the variety of unique services offered which group members might find beneficial to  engage in, including both virtual and in-person  support groups, as well as peer support program for mentoring.  Samantha Romero offered time to answer member's questions regarding services and encouraged them to consider utilizing these services to assist in working towards their individual wellness goals.  Intervention was effective, as evidenced by Samantha Romero participating in discussion with speaker on the subject, reporting that this was helpful since she may consider joining the HER support group to expand her network, in addition to recommending family for other services that were offered.    Assessment and Plan: Counselor recommends that [place] remain in IOP treatment to better manage mental health symptoms, ensure stability and pursue completion of treatment plan goals. Counselor recommends adherence to crisis/safety plan, taking medications as prescribed, and following up with medical professionals if any issues arise.   Follow Up Instructions: Counselor will send Webex link for next session. Tahisha was advised to call back or seek an in-person evaluation if the symptoms worsen or if the condition fails to improve as anticipated.   Collaboration of Care:   Medication Management AEB Dr. Fatima Sanger or Ricky Ala, NP                                          Case Manager AEB Dellia Nims, CNA   Patient/Guardian was advised Release of Information must be obtained prior to any record release in order to collaborate their care with an outside provider. Patient/Guardian was advised if they have not already done so to contact the registration department to sign all necessary forms in order for [place] to release information regarding their care.   Consent: Patient/Guardian gives verbal consent for treatment and assignment of benefits for services provided during this visit. Patient/Guardian expressed understanding and agreed to proceed.  I provided 180 minutes of non-face-to-face time during this encounter.   Shade Flood, [place], LCAS 04/23/22

## 2022-04-26 ENCOUNTER — Encounter (HOSPITAL_COMMUNITY): Payer: Self-pay | Admitting: Licensed Clinical Social Worker

## 2022-04-26 ENCOUNTER — Other Ambulatory Visit (HOSPITAL_COMMUNITY): Payer: BC Managed Care – PPO | Attending: Licensed Clinical Social Worker | Admitting: Licensed Clinical Social Worker

## 2022-04-26 DIAGNOSIS — F332 Major depressive disorder, recurrent severe without psychotic features: Secondary | ICD-10-CM | POA: Diagnosis not present

## 2022-04-26 DIAGNOSIS — F411 Generalized anxiety disorder: Secondary | ICD-10-CM

## 2022-04-26 MED ORDER — DULOXETINE HCL 30 MG PO CPEP: 30.0000 mg | ORAL_CAPSULE | Freq: Every day | ORAL | 0 refills | Status: DC
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
## 2022-04-26 NOTE — Progress Notes (Signed)
Virtual Visit via Video Note   I connected with Samantha Romero on 04/26/22 at  9:00 AM EDT by a video enabled telemedicine application and verified that I am speaking with the correct person using two identifiers.   At orientation to the IOP program, Case Manager discussed the limitations of evaluation and management by telemedicine and the availability of in person appointments. The patient expressed understanding and agreed to proceed with virtual visits throughout the duration of the program.   Location:  Patient: Patient Home Provider: Clinical Home Office   History of Present Illness: MDD with anxious distress    Observations/Objective: Check In: Case Manager checked in with all participants to review discharge dates, insurance authorizations, work-related documents and needs from the treatment team regarding medications. Samantha Romero stated needs and engaged in discussion.    Initial Therapeutic Activity: Counselor facilitated a check-in with Samantha Romero to assess for safety, sobriety and medication compliance.  Counselor also inquired about Samantha Romero's current emotional ratings, as well as any significant changes in thoughts, feelings or behavior since previous check in.  Samantha Romero presented for session on time and was alert, oriented x5, with no evidence or self-report of active SI/HI or A/V H.  Samantha Romero reported compliance with medication and denied use of alcohol or illicit substances.  Samantha Romero reported scores of 7/10 for depression, 8/10 for anxiety, and 0/10 for anger/irritability.  Samantha Romero denied any recent outbursts or panic attacks.  Samantha Romero reported that a recent struggle was staying up all night worrying about a resignation letter she needs to send her employer today.  She reported that she only slept 1-2 hours and has a tendency to ruminate on stressors, but was receptive to feedback from counselor and group on how to accomplish this task successfully.  Samantha Romero reported that her goal today is to send this  letter, and then take time to relax afterward by focusing on self-care, stating "I need to invest in myself in some way".    Second Therapeutic Activity: Counselor introduced topic of self-care today.  Counselor explained how this can be defined as the things one does to maintain good health and improve well-being.  Counselor provided members with a self-care assessment form to complete.  This handout featured various sub-categories of self-care, including physical, psychological/emotional, social, spiritual, and professional.  Members were asked to rank their engagement in the activities listed for each dimension on a scale of 1-3, with 1 indicating 'Poor', 2 indicating 'Lewis', and 3 indicating 'Well'.  Counselor invited members to share results of their assessment, and inquired about which areas of self-care they are doing well in, as well as areas that require attention, and how they plan to begin addressing this during treatment.  Intervention effectiveness could not be measured, as Samantha Romero was meeting with Dr. [name] during this portion of group, and could not participate as a result.    Assessment and Plan: Counselor recommends that Samantha Romero remain in IOP treatment to better manage mental health symptoms, ensure stability and pursue completion of treatment plan goals. Counselor recommends adherence to crisis/safety plan, taking medications as prescribed, and following up with medical professionals if any issues arise.   Follow Up Instructions: Counselor will send Webex link for next session. Samantha Romero was advised to call back or seek an in-person evaluation if the symptoms worsen or if the condition fails to improve as anticipated.   Collaboration of Care:   Medication Management AEB Dr. Fatima Sanger or Ricky Ala, NP  Case Manager AEB Dellia Nims, CNA   Patient/Guardian was advised Release of Information must be obtained prior to any record release in order to  collaborate their care with an outside provider. Patient/Guardian was advised if they have not already done so to contact the registration department to sign all necessary forms in order for [place] to release information regarding their care.   Consent: Patient/Guardian gives verbal consent for treatment and assignment of benefits for services provided during this visit. Patient/Guardian expressed understanding and agreed to proceed.  I provided 180 minutes of non-face-to-face time during this encounter.   Shade Flood, [place], LCAS 04/26/22

## 2022-04-27 ENCOUNTER — Ambulatory Visit: Payer: BC Managed Care – PPO | Admitting: Psychology

## 2022-04-27 ENCOUNTER — Other Ambulatory Visit (HOSPITAL_COMMUNITY): Payer: BC Managed Care – PPO | Admitting: Licensed Clinical Social Worker

## 2022-04-27 DIAGNOSIS — F332 Major depressive disorder, recurrent severe without psychotic features: Secondary | ICD-10-CM

## 2022-04-27 DIAGNOSIS — F411 Generalized anxiety disorder: Secondary | ICD-10-CM

## 2022-04-27 NOTE — Progress Notes (Signed)
Virtual Visit via Video Note   I connected with [name] on 04/27/22 at  9:00 AM EDT by a video enabled telemedicine application and verified that I am speaking with the correct person using two identifiers.   At orientation to the IOP program, Case Manager discussed the limitations of evaluation and management by telemedicine and the availability of in person appointments. The patient expressed understanding and agreed to proceed with virtual visits throughout the duration of the program.   Location:  Patient: Patient Home Provider: [facility]   History of Present Illness: MDD with anxious distress    Observations/Objective: Check In: Case Manager checked in with all participants to review discharge dates, insurance authorizations, work-related documents and needs from the treatment team regarding medications. Heily stated needs and engaged in discussion.    Initial Therapeutic Activity: Counselor facilitated a check-in with Jady to assess for safety, sobriety and medication compliance.  Counselor also inquired about Nataki's current emotional ratings, as well as any significant changes in thoughts, feelings or behavior since previous check in.  Verdine presented for session on time and was alert, oriented x5, with no evidence or self-report of active SI/HI or A/V H.  Lalani reported compliance with medication and denied use of alcohol or illicit substances.  Mayson reported scores of 8/10 for depression, 6/10 for anxiety, and 2/10 for irritability.  Joni denied any recent outbursts or panic attacks.  Atira reported that a recent success was completing a resignation letter for her job, which was difficult.  Syvanna reported that a recent struggle was feeling disappointed that she didn't complete more tasks yesterday.  Grabiela reported that her goal today is to make some phone calls after group and then attend a virtual book club later tonight.    Second Therapeutic Activity: Counselor  introduced [employer], [place] Chaplain to provide psychoeducation on topic of Grief and Loss with members today.  Estill Bamberg began discussion by checking in with the group about their baseline mood today, general thoughts on what grief means to them and how it has affected them personally in the past.  Estill Bamberg provided information on how the process of grief/loss can differ depending upon one's unique culture, and categories of loss one could experience (i.e. loss of a person, animal, relationship, job, identity, etc).  Estill Bamberg encouraged members to be mindful of how pervasive loss can be, and how to recognize signs which could indicate that this is having an impact on one's overall mental health and wellbeing.  Intervention was effective, as evidenced by Morrison participating in discussion with speaker on the subject, reporting that grief is a significant problem for her, since she has lost many people close to her, and she has a process of compartmentalizing feelings and trying to appear strong to others.  She stated "I've thought I was okay and presented okay after a loss, and then something comes up out of the blue, and those feelings all come out.  I've felt guilty about that, like I'm making it all about me.  One thing we have to do is get the tools and coping mechanisms down. That's something I'm working on because I've experienced a lot of loss in my life".  Natia was receptive to feedback from chaplain on how to faciliate her grieving process in a healthy manner moving forward.     Third Therapeutic Activity: Counselor introduced topic of grounding skills today.  Counselor defined these as simple strategies one can use to help detach from  difficult thoughts or feelings temporarily by focusing on something else.  Counselor noted that grounding will not solve the problem at hand, but can provide the practitioner with time to regain control over their thoughts and/or feelings and prevent the situation from  getting worse (i.e. interrupting a panic attack).  Counselor divided these into three categories (mental, physical, and soothing) and then provided examples of each which group members could practice during session.  Some of these included describing one's environment in detail or playing a categories game with oneself for mental category, taking a hot bath/shower, stretching, or carrying a grounding object for physical category, and saying kind statements, or visualizing people one cares about for soothing category.  Counselor inquired about which techniques members have used with success in the past, or will commit to learning, practicing, and applying now to improve coping abilities.  Intervention was effective, as evidenced by Taquita participating in discussion on the subject, trying out several of the techniques during session, and expressing interest in adding several to her available coping skills, such as playing a category game listing characters from Game of Thrones or albums by her favorite artist, visualizing herself at a peaceful place that brings her joy or relaxes her, sharing humorous memes with family that make them laugh; stepping outside barefoot and focusing on the physical sensations such as the soil underfoot or sun warming her skin; spraying calming essential oil scents, squeezing a stress ball, or practicing deep breathing.   Assessment and Plan: Counselor recommends that [place] remain in IOP treatment to better manage mental health symptoms, ensure stability and pursue completion of treatment plan goals. Counselor recommends adherence to crisis/safety plan, taking medications as prescribed, and following up with medical professionals if any issues arise.   Follow Up Instructions: Counselor will send Webex link for next session. Lindzey was advised to call back or seek an in-person evaluation if the symptoms worsen or if the condition fails to improve as anticipated.   Collaboration of  Care:   Medication Management AEB Dr. Fatima Sanger or Ricky Ala, NP                                          Case Manager AEB Dellia Nims, CNA   Patient/Guardian was advised Release of Information must be obtained prior to any record release in order to collaborate their care with an outside provider. Patient/Guardian was advised if they have not already done so to contact the registration department to sign all necessary forms in order for [place] to release information regarding their care.   Consent: Patient/Guardian gives verbal consent for treatment and assignment of benefits for services provided during this visit. Patient/Guardian expressed understanding and agreed to proceed.  I provided 180 minutes of non-face-to-face time during this encounter.   Shade Flood, [place], LCAS 04/27/22

## 2022-04-28 ENCOUNTER — Other Ambulatory Visit (HOSPITAL_COMMUNITY): Payer: BC Managed Care – PPO | Admitting: Psychiatry

## 2022-04-28 DIAGNOSIS — F332 Major depressive disorder, recurrent severe without psychotic features: Secondary | ICD-10-CM | POA: Diagnosis not present

## 2022-04-28 NOTE — Progress Notes (Signed)
Virtual Visit via Video Note   I connected with Samantha Romero on 04/28/22 at  9:00 AM EDT by a video enabled telemedicine application and verified that I am speaking with the correct person using two identifiers.   At orientation to the IOP program, Case Manager discussed the limitations of evaluation and management by telemedicine and the availability of in person appointments. The patient expressed understanding and agreed to proceed with virtual visits throughout the duration of the program.   Location:  Patient: Patient Home Provider: [facility]   History of Present Illness: MDD with anxious distress   Observations/Objective: Check In: Case Manager checked in with all participants to review discharge dates, insurance authorizations, work-related documents and needs from the treatment team regarding medications. Samantha Romero stated needs and engaged in discussion.    Initial Therapeutic Activity: Counselor facilitated a check-in with Samantha Romero to assess for safety, sobriety and medication compliance.  Counselor also inquired about Samantha Romero's current emotional ratings, as well as any significant changes in thoughts, feelings or behavior since previous check in.  Samantha Romero presented for session on time and was alert, oriented x5, with no evidence or self-report of active SI/HI or A/V H.  Samantha Romero reported compliance with medication and denied use of alcohol or illicit substances.  Samantha Romero reported scores of 6/10 for depression, 6/10 for anxiety, and 0/10 for anger/irritability.  Samantha Romero denied any recent outbursts or panic attacks.  Samantha Romero reported that a recent success was taking a nap after group yesterday since she was emotionally drained and avoided pushing herself to do other tasks.  She reported that she was able to attend a virtual book club meeting later in the evening.  Samantha Romero reported that a recent struggle has been dealing with nightmares which can be distressing at night, and wondering if it is  because of her medication.  Samantha Romero reported that she has confided in her mother, and the provider about this concern.  Samantha Romero reported that her goal today is to reorganize her living space to help make it more supportive of her mental health, including bringing in more light, flowers, and positive scents.        Second Therapeutic Activity: Counselor introduced Samantha Romero, [employer] Pharmacist, to provide psychoeducation on topic of medication compliance with members today.  Samantha Romero provided psychoeducation on classes of medications such as antidepressants, antipsychotics, what symptoms they are intended to treat, and any side effects one might encounter while on a particular prescription.  Time was allowed for clients to ask any questions they might have of [facility] regarding this specialty.  Intervention effectiveness could not be measured, as client did not participate in discussion.    Third Therapeutic Activity: Counselor provided psychoeducation on subject of boundaries with group members today using a virtual handout.  This handout defined boundaries as the limits and rules that we set for ourselves within relationships, and featured a breakdown of the 3 common categories of boundaries (i.e. porous, rigid, and healthy), along with typical traits specific to each one for easy identification.  It was noted that most people have a mixture of different boundary types depending on setting, person, and culture.  Additional information was provided on the types of boundaries (i.e. physical, intellectual, emotional, sexual, material, and time) within relationships, and what could be considered healthy versus unhealthy. Counselor tasked members with identifying what types of boundaries they presently hold within her own support systems, the collective impact these boundaries have upon their mental health, and changes that could be made  in order to more effectively communicate individual mental health needs.  Intervention  was effective, as evidenced by Kenadi actively engaging in discussion on topic, reporting that she has a mix of rigid and porous boundaries due to traits such as being protective of her personal information, having trouble saying "No" to the requests of others, fearing rejection if she doesn't' comply with their demands, and being unlikely to ask for help.  She stated "I have a hard time sharing the full story of a situation, so I give just enough. I realize I could loosen up on that".  Samantha Romero reported that she would work to improve boundaries by strengthening her assertive communication skills so that she can express her needs directly, but respectfully to supports, in addition to building overall self-esteem.       Assessment and Plan: Counselor recommends that [place] remain in IOP treatment to better manage mental health symptoms, ensure stability and pursue completion of treatment plan goals. Counselor recommends adherence to crisis/safety plan, taking medications as prescribed, and following up with medical professionals if any issues arise.   Follow Up Instructions: Counselor will send Webex link for next session. Samantha Romero was advised to call back or seek an in-person evaluation if the symptoms worsen or if the condition fails to improve as anticipated.   Collaboration of Care:   Medication Management AEB Dr. Fatima Sanger or Ricky Ala, NP                                          Case Manager AEB Dellia Nims, CNA   Patient/Guardian was advised Release of Information must be obtained prior to any record release in order to collaborate their care with an outside provider. Patient/Guardian was advised if they have not already done so to contact the registration department to sign all necessary forms in order for [place] to release information regarding their care.   Consent: Patient/Guardian gives verbal consent for treatment and assignment of benefits for services provided during this visit.  Patient/Guardian expressed understanding and agreed to proceed.  I provided 180 minutes of non-face-to-face time during this encounter.   Shade Flood, [place], LCAS 04/28/22

## 2022-04-29 ENCOUNTER — Other Ambulatory Visit (HOSPITAL_COMMUNITY): Payer: Medicaid Other | Admitting: Licensed Clinical Social Worker

## 2022-04-29 ENCOUNTER — Telehealth (HOSPITAL_COMMUNITY): Payer: Self-pay | Admitting: Psychiatry

## 2022-04-30 ENCOUNTER — Other Ambulatory Visit (HOSPITAL_COMMUNITY): Payer: Medicaid Other | Admitting: Psychiatry

## 2022-04-30 ENCOUNTER — Telehealth: Payer: Self-pay

## 2022-04-30 ENCOUNTER — Telehealth (HOSPITAL_COMMUNITY): Payer: Self-pay | Admitting: Psychiatry

## 2022-04-30 NOTE — Telephone Encounter (Signed)
VMT requesting call back reference next PREP class

## 2022-05-03 ENCOUNTER — Other Ambulatory Visit (HOSPITAL_COMMUNITY): Payer: Medicaid Other | Attending: Licensed Clinical Social Worker | Admitting: Licensed Clinical Social Worker

## 2022-05-03 DIAGNOSIS — F411 Generalized anxiety disorder: Secondary | ICD-10-CM | POA: Insufficient documentation

## 2022-05-03 DIAGNOSIS — G4700 Insomnia, unspecified: Secondary | ICD-10-CM | POA: Insufficient documentation

## 2022-05-03 DIAGNOSIS — F909 Attention-deficit hyperactivity disorder, unspecified type: Secondary | ICD-10-CM | POA: Diagnosis not present

## 2022-05-03 DIAGNOSIS — F332 Major depressive disorder, recurrent severe without psychotic features: Secondary | ICD-10-CM | POA: Diagnosis not present

## 2022-05-03 NOTE — Progress Notes (Signed)
Virtual Visit via Video Note   I connected with Samantha Romero on 05/03/22 at  9:00 AM EDT by a video enabled telemedicine application and verified that I am speaking with the correct person using two identifiers.   At orientation to the IOP program, Case Manager discussed the limitations of evaluation and management by telemedicine and the availability of in person appointments. The patient expressed understanding and agreed to proceed with virtual visits throughout the duration of the program.   Location:  Patient: Patient Home Provider: [facility]   History of Present Illness: MDD with anxious distress    Observations/Objective: Check In: Case Manager checked in with all participants to review discharge dates, insurance authorizations, work-related documents and needs from the treatment team regarding medications. Samantha Romero stated needs and engaged in discussion.    Initial Therapeutic Activity: Counselor facilitated a check-in with Samantha Romero to assess for safety, sobriety and medication compliance.  Counselor also inquired about Samantha Romero's current emotional ratings, as well as any significant changes in thoughts, feelings or behavior since previous check in.  Samantha Romero presented for session on time and was alert, oriented x5, with no evidence or self-report of active SI/HI or A/V H.  Samantha Romero reported compliance with medication and denied use of alcohol or illicit substances.  Samantha Romero reported scores of 8/10 for depression, 8/10 for anxiety, and 0/10 for anger/irritability.  Samantha Romero denied any recent outbursts.  Samantha Romero reported that a recent success was doing some cleaning around her home over the weekend to improve her living space.  Samantha Romero reported that a recent struggle was experiencing 2 panic attacks over the weekend which she believes was triggered by "Worry and stress".  Samantha Romero reported that her goal today is to outreach friends and family this afternoon to update them on how she is doing and  strengthen her support network.         Second Therapeutic Activity: Counselor covered topic of conflict resolution today.  Counselor virtually shared a handout on subject with members which warned against the 'four horsemen' of communication traps that should be avoided due to tendency to escalate and damage a relationship.  These included criticism, defensiveness, contempt, and stonewalling.  'Antidotes' to these harmful behaviors were offered as healthy replacements to improve communication and understanding, including approaching problems with a gentle startup approach, taking responsibility for one's behavior, sharing fondness/admiration, and using self-soothing to calm down and focus on the problem at hand.  Counselor encouraged members to share recent experiences with conflict that they have faced, which approach they utilized, and any changes that they would plan to implement in order to improve overall conflict resolution skills.  Intervention was effective, as evidenced by Samantha Romero actively participating in discussion on topic, reporting that she has engaged in some of these communication traps, including defensiveness.  Samantha Romero stated "I don't typically shift blame to others, but I make excuses.  I never knew it could be viewed as a defense.  For example at work one time I missed a deadline because I was helping another [employer].  When the supervisor was talking about that with me, I didn't take responsibility".  Samantha Romero reported that this problem also stems from her need to help others with their problems and put her own second, as she worries about rejection if she speaks up about her needs.  Samantha Romero expressed receptiveness to alternative strategies offered in order to improve conflict resolution skills, including owning up to her own behavior without blaming others, and using 'soft startup' skills  to set healthier boundaries within her support network.   She stated "Conflict doesn't have to mean chaos".     Assessment and Plan: Counselor recommends that Samantha Romero remain in IOP treatment to better manage mental health symptoms, ensure stability and pursue completion of treatment plan goals. Counselor recommends adherence to crisis/safety plan, taking medications as prescribed, and following up with medical professionals if any issues arise.   Follow Up Instructions: Counselor will send Webex link for next session. Samantha Romero was advised to call back or seek an in-person evaluation if the symptoms worsen or if the condition fails to improve as anticipated.   Collaboration of Care:   Medication Management AEB Dr. Fatima Sanger or Ricky Ala, NP                                          Case Manager AEB Dellia Nims, CNA   Patient/Guardian was advised Release of Information must be obtained prior to any record release in order to collaborate their care with an outside provider. Patient/Guardian was advised if they have not already done so to contact the registration department to sign all necessary forms in order for [place] to release information regarding their care.   Consent: Patient/Guardian gives verbal consent for treatment and assignment of benefits for services provided during this visit. Patient/Guardian expressed understanding and agreed to proceed.  I provided 180 minutes of non-face-to-face time during this encounter.   Shade Flood, LCSW, LCAS 05/03/22

## 2022-05-04 ENCOUNTER — Other Ambulatory Visit (HOSPITAL_COMMUNITY): Payer: Medicaid Other | Admitting: Licensed Clinical Social Worker

## 2022-05-04 ENCOUNTER — Encounter (HOSPITAL_COMMUNITY): Payer: Self-pay | Admitting: Licensed Clinical Social Worker

## 2022-05-04 DIAGNOSIS — F332 Major depressive disorder, recurrent severe without psychotic features: Secondary | ICD-10-CM | POA: Diagnosis not present

## 2022-05-04 DIAGNOSIS — G4709 Other insomnia: Secondary | ICD-10-CM

## 2022-05-04 DIAGNOSIS — F411 Generalized anxiety disorder: Secondary | ICD-10-CM

## 2022-05-04 MED ORDER — TRAZODONE HCL 50 MG PO TABS: 50.0000 mg | ORAL_TABLET | Freq: Every day | ORAL | 1 refills | Status: DC

## 2022-05-04 NOTE — Progress Notes (Signed)
Virtual Visit via Video Note   I connected with Samantha Romero on 05/04/22 at  9:00 AM EDT by a video enabled telemedicine application and verified that I am speaking with the correct person using two identifiers.   At orientation to the IOP program, Case Manager discussed the limitations of evaluation and management by telemedicine and the availability of in person appointments. The patient expressed understanding and agreed to proceed with virtual visits throughout the duration of the program.   Location:  Patient: Patient Home Provider: [facility] [place] [facility]   History of Present Illness: MDD with anxious distress    Observations/Objective: Check In: Case Manager checked in with all participants to review discharge dates, insurance authorizations, work-related documents and needs from the treatment team regarding medications. Samantha Romero stated needs and engaged in discussion.    Initial Therapeutic Activity: Counselor facilitated a check-in with Samantha Romero to assess for safety, sobriety and medication compliance.  Counselor also inquired about Samantha Romero's current emotional ratings, as well as any significant changes in thoughts, feelings or behavior since previous check in.  Samantha Romero presented for session on time and was alert, oriented x5, with no evidence or self-report of active SI/HI or A/V H.  Samantha Romero reported compliance with medication and denied use of alcohol or illicit substances.  Samantha Romero reported scores of 7/10 for depression, 8/10 for anxiety, and 7/10 for irritability.  Samantha Romero denied any recent outbursts or panic attacks.  Samantha Romero reported that a recent struggle was feeling ill yesterday, and so tired that she needed to nap after group.  Samantha Romero reported that a success was getting the motivation to pick up groceries yesterday after resting, and cooking a healthy meal for herself.   She reported that she was also able to take the trash out, stating "That's a big deal for me".  Samantha Romero reported that her goal today  is to reduce time spent negatively comparing herself to others.      Second Therapeutic Activity: Counselor provided demonstration of relaxation technique known as mindful breathing meditation to help members increase sense of calm, resiliency, and control.  Counselor guided members through process of getting comfortable, achieving a relaxed breathing rhythm, and focusing on this for several minutes, allowing troubling thoughts and feelings to come and go without rumination.  Counselor processed effectiveness of activity afterward in discussion with members, including how this impacted their mental state, whether it was difficult to stay focused, and if they plan to include it in self-care routine to improve day-to-day coping.  Intervention was effective, as evidenced by Samantha Romero participating in exercise, and reporting that she enjoyed it due to feeling more relaxed, and would plan to add it to her coping skills.    Third Therapeutic Activity: Counselor introduced [employer], [place] Chaplain to provide psychoeducation on topic of Grief and Loss with members today.  Samantha Romero began discussion by checking in with the group about their baseline mood today, general thoughts on what grief means to them and how it has affected them personally in the past.  Samantha Romero provided information on how the process of grief/loss can differ depending upon one's unique culture, and categories of loss one could experience (i.e. loss of a person, animal, relationship, job, identity, etc).  Samantha Romero encouraged members to be mindful of how pervasive loss can be, and how to recognize signs which could indicate that this is having an impact on one's overall mental health and wellbeing.  Intervention was effective, as evidenced by Samantha Romero participating in discussion with speaker on  the subject, reporting that this topic made her reflect upon the loss of her previous lifestyle before mental health changed, stating "I was very social.  I was  on committees and boards, and around this time of year I would normally be outreaching and putting events together.  Those tasks brought a sense of pride, importance, feeling like I had made it. That is tied to my sense of self-worth".  Samantha Romero reported that she is currently processing the loss of comforts, perks, and accomplishment that came with this role, and was receptive to feedback and encouragement from chaplain to continue with this goal.     Assessment and Plan: Counselor recommends that Samantha Romero remain in IOP treatment to better manage mental health symptoms, ensure stability and pursue completion of treatment plan goals. Counselor recommends adherence to crisis/safety plan, taking medications as prescribed, and following up with medical professionals if any issues arise.   Follow Up Instructions: Counselor will send Webex link for next session. Samantha Romero was advised to call back or seek an in-person evaluation if the symptoms worsen or if the condition fails to improve as anticipated.   Collaboration of Care:   Medication Management AEB Dr. Fatima Sanger or Ricky Ala, NP                                          Case Manager AEB Dellia Nims, CNA   Patient/Guardian was advised Release of Information must be obtained prior to any record release in order to collaborate their care with an outside provider. Patient/Guardian was advised if they have not already done so to contact the registration department to sign all necessary forms in order for [place] to release information regarding their care.   Consent: Patient/Guardian gives verbal consent for treatment and assignment of benefits for services provided during this visit. Patient/Guardian expressed understanding and agreed to proceed.  I provided 180 minutes of non-face-to-face time during this encounter.   Shade Flood, LCSW, LCAS 05/04/22

## 2022-05-04 NOTE — Progress Notes (Signed)
BH MD/PA/NP IOP Progress Note  Virtual Visit via Video Note  I connected with Samantha Romero on 05/04/22 at  9:00 AM EST by a video enabled telemedicine application and verified that I am speaking with the correct person using two identifiers.  Location: Patient: Home Provider: [facility]   I discussed the limitations of evaluation and management by telemedicine and the availability of in person appointments. The patient expressed understanding and agreed to proceed.   05/04/2022 11:00 AM Samantha Romero  MRN:  [id number]  Chief Complaint:  Chief Complaint  Patient presents with   Follow-up   Insomnia   HPI:  Samantha Romero is a 47 yr old female who presents via Virtual Video Visit for Follow Up and Medication Management, she enrolled in the IOP Program on 04/23/2022.  PPHx is significant for Depression, Anxiety, and ADHD, and no history of Suicide Attempts, Self Injurious Behavior, or Psychiatric Hospitalizations.    She reports that she has been not doing so well the last week.  She reports that she ran out of her trazodone and so her sleep has been significantly worse sleeping at most approximately 3 hours uninterrupted.  She reports no side effects to the increase in her Cymbalta but reports she does not know if there have been any improvements because of the issues with her sleep.  Discussed that a refill for the trazodone would be sent in at the dose had been and she was agreeable with this.  She reports no SI, HI, or AVH.  She reports her sleep has been poor the past week without the trazodone.  She reports her appetite is good.  She reports she is tired but otherwise reports no other concerns at present.   Visit Diagnosis:    ICD-10-CM   1. Major depressive disorder, recurrent episode, severe with anxious distress ([place])  F33.2     2. GAD (generalized anxiety disorder)  F41.1     3. Other insomnia  G47.09 traZODone (DESYREL) 50 MG tablet      Past Psychiatric History:  Depression, Anxiety, and ADHD, and no history of Suicide Attempts, Self Injurious Behavior, or Psychiatric Hospitalizations.   Past Medical History:  Past Medical History:  Diagnosis Date   Anxiety    panic attacks   Asthma    CHF (congestive heart failure), NYHA class II, acute, systolic ([place]) 07/05/8117   Depression    Fibromyalgia    Hypertension    no meds currently   No pertinent past medical history    Pneumonia    hx of    Sinus problem     Past Surgical History:  Procedure Laterality Date   ESOPHAGOGASTRODUODENOSCOPY N/A 11/26/2014   Procedure: ESOPHAGOGASTRODUODENOSCOPY (EGD);  Surgeon: Alphonsa Overall, MD;  Location: [facility];  Service: General;  Laterality: N/A;   GASTRIC BANDING PORT REVISION  08/24/11   LAPAROSCOPIC GASTRIC BANDING  12/26/2007   LAPAROSCOPIC LYSIS OF ADHESIONS N/A 01/06/2015   Procedure: LAPAROSCOPIC LYSIS OF ADHESIONS;  Surgeon: Johnathan Hausen, MD;  Location: [facility];  Service: General;  Laterality: N/A;   LAPAROSCOPIC REPAIR AND REMOVAL OF GASTRIC BAND N/A 01/06/2015   Procedure: LAPAROSCOPIC RESIGHTING OF GASTRIC Band PORT;  Surgeon: Johnathan Hausen, MD;  Location: [facility];  Service: General;  Laterality: N/A;   NO PAST SURGERIES      Family Psychiatric History: Mother- Depression Father- Addiction Brother- Bipolar Disorder Nephew- Depression Multiple members Paternal and Maternal side- EtOH Abuse No Known Suicides  Family History:  Family History  Problem Relation Age of Onset   Ulcers Mother    Hypertension Mother    Cancer Maternal Uncle        lung   Cancer Maternal Grandfather        lung and back    Social History:  Social History   Socioeconomic History   Marital status: Single    Spouse name: Not on file   Number of children: Not on file   Years of education: Not on file   Highest education level: Not on file  Occupational History   Not on file  Tobacco Use   Smoking status: Never   Smokeless tobacco: Never  Vaping Use    Vaping Use: Never used  Substance and Sexual Activity   Alcohol use: Never   Drug use: Never   Sexual activity: Not Currently    Birth control/protection: I.U.D.    Comment: Mirena  Other Topics Concern   Not on file  Social History Narrative   Not on file   Social Determinants of Health   Financial Resource Strain: Not on file  Food Insecurity: Not on file  Transportation Needs: Not on file  Physical Activity: Not on file  Stress: Not on file  Social Connections: Not on file    Allergies:  Allergies  Allergen Reactions   Grass Extracts [Gramineae Pollens]     Nasal congestion, post nasal drip    Shellfish Allergy Itching    In throat only.    Metabolic Disorder Labs: Lab Results  Component Value Date   HGBA1C 5.9 (H) 01/06/2022   No results found for: "PROLACTIN" Lab Results  Component Value Date   CHOL 158 11/19/2019   TRIG 93 11/19/2019   HDL 50 11/19/2019   CHOLHDL 3.2 11/19/2019   LDLCALC 91 11/19/2019   LDLCALC 82 08/17/2019   Lab Results  Component Value Date   TSH 1.040 04/14/2022   TSH 1.470 05/08/2020    Therapeutic Level Labs: No results found for: "LITHIUM" No results found for: "VALPROATE" No results found for: "CBMZ"  Current Medications: Current Outpatient Medications  Medication Sig Dispense Refill   albuterol (PROVENTIL) (2.5 MG/3ML) 0.083% nebulizer solution Take 3 mLs (2.5 mg total) by nebulization every 6 (six) hours as needed for wheezing or shortness of breath. 150 mL 1   albuterol (VENTOLIN HFA) 108 (90 Base) MCG/ACT inhaler Inhale 2 puffs into the lungs every 6 (six) hours as needed for wheezing or shortness of breath. 8 g 2   ALPRAZolam (XANAX) 1 MG tablet Take 1 tablet (1 mg total) by mouth 2 (two) times daily as needed for anxiety. 20 tablet 0   azelastine (ASTELIN) 0.1 % nasal spray Place 2 sprays into both nostrils 2 (two) times daily. 30 mL 5   buPROPion (WELLBUTRIN XL) 150 MG 24 hr tablet Take 3 tablets (450 mg total)  by mouth daily. 270 tablet 2   carvedilol (COREG) 25 MG tablet TAKE 1 TABLET (25 MG TOTAL) BY MOUTH TWICE A DAY WITH MEALS 180 tablet 3   DULoxetine (CYMBALTA) 30 MG capsule Take 1 capsule (30 mg total) by mouth daily. 30 capsule 0   DULoxetine (CYMBALTA) 60 MG capsule TAKE 1 CAPSULE BY MOUTH EVERY DAY 90 capsule 1   empagliflozin (JARDIANCE) 10 MG TABS tablet TAKE ONE TABLET BY MOUTH DAILY BEFORE BREAKFAST 90 tablet 3   Fluticasone-Salmeterol 232-14 MCG/ACT AEPB Inhale 1 puff into the lungs in the morning and at bedtime. 1 each 5   furosemide (LASIX) 80  MG tablet Take 1 tablet (80 mg total) by mouth daily. 90 tablet 3   levocetirizine (XYZAL) 5 MG tablet TAKE 1 TABLET BY MOUTH EVERY DAY IN THE EVENING 90 tablet 0   levonorgestrel (MIRENA, 52 MG,) 20 MCG/DAY IUD      lisdexamfetamine (VYVANSE) 30 MG capsule Take 1 capsule (30 mg total) by mouth daily. 30 capsule 0   meloxicam (MOBIC) 15 MG tablet TAKE 1 TABLET BY MOUTH EVERY DAY AS NEEDED FOR PAIN 30 tablet 0   montelukast (SINGULAIR) 10 MG tablet TAKE 1 TABLET BY MOUTH EVERYDAY AT BEDTIME 90 tablet 2   omeprazole (PRILOSEC) 40 MG capsule TAKE 1 CAPSULE BY MOUTH EVERY DAY (Patient not taking: Reported on 03/24/2022) 90 capsule 1   promethazine-dextromethorphan (PROMETHAZINE-DM) 6.25-15 MG/5ML syrup Take 5 mLs by mouth at bedtime as needed for cough. 118 mL 0   sacubitril-valsartan (ENTRESTO) 97-103 MG Take 1 tablet by mouth 2 (two) times daily. 60 tablet 11   spironolactone (ALDACTONE) 25 MG tablet Take 1 tablet (25 mg total) by mouth daily. 90 tablet 3   traZODone (DESYREL) 50 MG tablet Take 1 tablet (50 mg total) by mouth at bedtime. 30 tablet 1   Vitamin D, Ergocalciferol, (DRISDOL) 1.25 MG (50000 UNIT) CAPS capsule Take 50,000 Units by mouth once a week.     No current facility-administered medications for this visit.     Musculoskeletal: Strength & Muscle Tone: within normal limits Gait & Station:  Sitting During Interview Patient  leans: N/A  Psychiatric Specialty Exam: Review of Systems  Respiratory:  Negative for shortness of breath.   Cardiovascular:  Negative for chest pain.  Gastrointestinal:  Negative for abdominal pain, constipation, diarrhea, nausea and vomiting.  Neurological:  Negative for dizziness, weakness and headaches.  Psychiatric/Behavioral:  Positive for dysphoric mood and sleep disturbance. Negative for hallucinations and suicidal ideas. The patient is not nervous/anxious.     There were no vitals taken for this visit.There is no height or weight on file to calculate BMI.  General Appearance: Casual and Fairly Groomed  Eye Contact:  Good  Speech:  Clear and Coherent and Normal Rate  Volume:  Normal  Mood:  Dysphoric and tired  Affect:  Congruent  Thought Process:  Coherent and Goal Directed  Orientation:  Full (Time, Place, and Person)  Thought Content: WDL and Logical   Suicidal Thoughts:  No  Homicidal Thoughts:  No  Memory:  Immediate;   Good Recent;   Good  Judgement:  Good  Insight:  Good  Psychomotor Activity:  Normal  Concentration:  Concentration: Good and Attention Span: Good  Recall:  Good  Fund of Knowledge: Good  Language: Good  Akathisia:  Negative  Handed:  Left  AIMS (if indicated): not done  Assets:  Communication Skills Desire for Improvement Housing Resilience  ADL's:  Intact  Cognition: WNL  Sleep:  Poor   Screenings: GAD-7    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 [facility] at [employer]  Total GAD-7 Score 17      PHQ2-9    Flowsheet Row Counselor from 04/22/2022 in BEHAV[facility]AL [facility]ALTH INTENSIVE PSYCH Office Visit from 03/24/2022 in [facility] INTENSIVE CARDIAC REHAB ORIENT from 02/25/2022 in [facility] Office Visit from 01/06/2022 in [facility] Nutrition from 04/20/2021 in [facility]  Nutrition & Diabetes Education Services at [facility] Total Score 6 3  5 1 0  PHQ-9 Total Score 22 16 19 10  --      Flowsheet Row Counselor from 04/22/2022 in [place] ED from 04/14/2022 in [facility] at [place]) ED from 10/08/2020 in [facility] Emergency Department at [place] Error: Question 6 not populated No Risk No Risk        Assessment and Plan:  Samantha Romero is a 47 yr old female who presents via Virtual Video Visit for Follow Up and Medication Management, she enrolled in the IOP Program on 04/23/2022.  PPHx is significant for Depression, Anxiety, and ADHD, and no history of Suicide Attempts, Self Injurious Behavior, or Psychiatric Hospitalizations.    Samantha Romero ran out of her Trazodone and so has had a significant impact on her sleep.  We will restart her Trazodone.  We will not make any other changes to her medications at this time.  We will continue to monitor.    MDD, Recurrent, Severe, w/ anxious distress  GAD: -Continue Wellbutrin XL 450 mg daily.  No refills sent at this time. -Increase Cymbalta to 30 mg AM and 60 mg QHS.  30 (30 mg) tablets with 0 refills. -Continue Xanax 1 mg BID PRN.  No refills sent at this time. -Continue Trazodone 50 mg QHS PRN.  30 tablets with 1 refill.   ADHD: -Continue Vyvanse 30 mg daily.  No refills sent at this time.   Collaboration of Care: Collaboration of Care: Other IOP Program  Patient/Guardian was advised Release of Information must be obtained prior to any record release in order to collaborate their care with an outside provider. Patient/Guardian was advised if they have not already done so to contact the registration department to sign all necessary forms in order for [place] to release information regarding their care.   Consent: Patient/Guardian gives verbal consent for treatment and assignment of benefits for services provided during  this visit. Patient/Guardian expressed understanding and agreed to proceed.    Briant Cedar, MD 05/04/2022, 11:00 AM  Follow Up Instructions:    I discussed the assessment and treatment plan with the patient. The patient was provided an opportunity to ask questions and all were answered. The patient agreed with the plan and demonstrated an understanding of the instructions.   The patient was advised to call back or seek an in-person evaluation if the symptoms worsen or if the condition fails to improve as anticipated.  I provided 15 minutes of non-face-to-face time during this encounter.   Briant Cedar, MD

## 2022-05-05 ENCOUNTER — Other Ambulatory Visit (HOSPITAL_COMMUNITY): Payer: Medicaid Other | Admitting: Licensed Clinical Social Worker

## 2022-05-05 ENCOUNTER — Ambulatory Visit: Payer: BC Managed Care – PPO | Admitting: Psychology

## 2022-05-05 DIAGNOSIS — F332 Major depressive disorder, recurrent severe without psychotic features: Secondary | ICD-10-CM | POA: Diagnosis not present

## 2022-05-05 NOTE — Progress Notes (Signed)
Virtual Visit via Video Note   I connected with Samantha Romero on 05/05/22 at  9:00 AM EDT by a video enabled telemedicine application and verified that I am speaking with the correct person using two identifiers.   At orientation to the IOP program, Case Manager discussed the limitations of evaluation and management by telemedicine and the availability of in person appointments. The patient expressed understanding and agreed to proceed with virtual visits throughout the duration of the program.   Location:  Patient: Patient Home Provider: [facility] [place] [facility]   History of Present Illness: MDD with anxious distress    Observations/Objective: Check In: Case Manager checked in with all participants to review discharge dates, insurance authorizations, work-related documents and needs from the treatment team regarding medications. Samantha Romero stated needs and engaged in discussion.    Initial Therapeutic Activity: Counselor facilitated a check-in with Samantha Romero to assess for safety, sobriety and medication compliance.  Counselor also inquired about Samantha Romero's current emotional ratings, as well as any significant changes in thoughts, feelings or behavior since previous check in.  Samantha Romero presented for session on time and was alert, oriented x5, with no evidence or self-report of active SI/HI or A/V H.  Samantha Romero reported compliance with medication and denied use of alcohol or illicit substances.  Samantha Romero reported scores of 5/10 for depression, 4/10 for anxiety, and 0/10 for anger/irritability.  Samantha Romero denied any recent outbursts or panic attacks.  Samantha Romero reported that a recent success was sleeping well last night, stating "I feel more rested, more present today".  Samantha Romero denied any new struggles at this time.  Samantha Romero reported that her goal today is to get outside the home, and go shopping somewhere like [employer] to cut back on isolation.         Second Therapeutic Activity: Counselor offered to teach group members an ACT  relaxation technique today to aid in managing difficult thoughts, feelings, urges, and sensations.  Counselor guided members through process of getting comfortable, achieving relaxing breathing rhythm, and then maintaining this throughout activity.  Counselor invited members to imagine a gently flowing stream in their mind with leaves floating upon it, and when any thoughts, feelings, urges, or sensations arose, good or bad, they were instructed to visualize placing them on these passing leaves over course of practice.  Intervention was effective, as evidenced by Samantha Romero successfully participating in activity and reporting that she initially had trouble focusing, but over time was able to visualize the stream and rid herself of invasive thoughts and feelings.  She stated "It felt good.  I needed it.  I do get stuck on things so Ill need to keep up with this".    Assessment and Plan: Counselor recommends that Samantha Romero remain in IOP treatment to better manage mental health symptoms, ensure stability and pursue completion of treatment plan goals. Counselor recommends adherence to crisis/safety plan, taking medications as prescribed, and following up with medical professionals if any issues arise.   Follow Up Instructions: Counselor will send Webex link for next session. Samantha Romero was advised to call back or seek an in-person evaluation if the symptoms worsen or if the condition fails to improve as anticipated.   Collaboration of Care:   Medication Management AEB Dr. Fatima Sanger or Ricky Ala, NP  Case Manager AEB Dellia Nims, CNA   Patient/Guardian was advised Release of Information must be obtained prior to any record release in order to collaborate their care with an outside provider. Patient/Guardian was advised if they have not already done so to contact the registration department to sign all necessary forms in order for [place] to release information regarding their  care.   Consent: Patient/Guardian gives verbal consent for treatment and assignment of benefits for services provided during this visit. Patient/Guardian expressed understanding and agreed to proceed.  I provided 180 minutes of non-face-to-face time during this encounter.   Shade Flood, LCSW, LCAS 05/05/22

## 2022-05-06 ENCOUNTER — Other Ambulatory Visit (HOSPITAL_COMMUNITY): Payer: Medicaid Other | Admitting: Licensed Clinical Social Worker

## 2022-05-06 DIAGNOSIS — F332 Major depressive disorder, recurrent severe without psychotic features: Secondary | ICD-10-CM | POA: Diagnosis not present

## 2022-05-06 NOTE — Progress Notes (Signed)
Virtual Visit via Video Note   I connected with Samantha Romero on 05/06/22 at  9:00 AM EDT by a video enabled telemedicine application and verified that I am speaking with the correct person using two identifiers.   At orientation to the IOP program, Case Manager discussed the limitations of evaluation and management by telemedicine and the availability of in person appointments. The patient expressed understanding and agreed to proceed with virtual visits throughout the duration of the program.   Location:  Patient: Patient Home Provider: [facility]   History of Present Illness: MDD with anxious distress    Observations/Objective: Check In: Case Manager checked in with all participants to review discharge dates, insurance authorizations, work-related documents and needs from the treatment team regarding medications. Samantha Romero stated needs and engaged in discussion.    Initial Therapeutic Activity: Counselor facilitated a check-in with Samantha Romero to assess for safety, sobriety and medication compliance.  Counselor also inquired about Samantha Romero's current emotional ratings, as well as any significant changes in thoughts, feelings or behavior since previous check in.  Samantha Romero presented for session on time and was alert, oriented x5, with no evidence or self-report of active SI/HI or A/V H.  Samantha Romero reported compliance with medication and denied use of alcohol or illicit substances.  Samantha Romero reported scores of 5/10 for depression, 3/10 for anxiety, and 0/10 for anger/irritability.  Samantha Romero denied any recent outbursts or panic attacks.  Samantha Romero reported that a recent success was getting out of the house to get new lipstick from [facility] [employer] yesterday, stating "I felt a little overwhelmed when I got to the store, but I still got out".  Samantha Romero reported that a recent struggle has been dealing with 'financial anxiety'.  Samantha Romero reported that her goal today is to take care of some chores in the house she has been putting off.        Second Therapeutic Activity: Counselor covered topic of distress tolerance skills today.  Counselor utilized a DBT handout which explained how distressing situations don't always have quick solutions, so the only choice is to sit with uncomfortable emotions until they pass.  Counselor offered the IMPROVE acronym as a solution to this problem, which outlined various skills (i.e. Imagery, Meaning, Prayer, Relaxation, 'One thing in the moment', Vacation, and Encouragement) that could be explored in order to improve ability to tolerate discomfort.  Counselor tasked members with identifying personalized strategies for each category which could have been implemented to handle a recent challenge more effectively.  Intervention was effective, as evidenced by Samantha Romero actively engaging in discussion on subject, reporting that one distressing situation that stands out to her is having to resolve a conflict or disagreement with someone, stating "I don't even know where to start".  Samantha Romero was able to identify several strategies for handling a similar struggle in the future, including visualizing herself walking through a park on a nice day,  visualizing the conversation going positively, watching a favorite TV show or movie with a positive outcome, reciting a mantra to fill her with peace, putting on a playlist of music that will help her relax and increase confidence, playing a puzzle game on her phone, having a sleepover at a friends house or taking a daytrip with a friend to [place] to walk around and look at furniture.    Assessment and Plan: Counselor recommends that Samantha Romero remain in IOP treatment to better manage mental health symptoms, ensure stability and pursue completion of treatment plan goals. Counselor recommends adherence to crisis/safety plan,  taking medications as prescribed, and following up with medical professionals if any issues arise.   Follow Up Instructions: Counselor will send Webex link for next  session. Samantha Romero was advised to call back or seek an in-person evaluation if the symptoms worsen or if the condition fails to improve as anticipated.   Collaboration of Care:   Medication Management AEB Dr. Fatima Sanger or Ricky Ala, NP                                          Case Manager AEB Dellia Nims, CNA   Patient/Guardian was advised Release of Information must be obtained prior to any record release in order to collaborate their care with an outside provider. Patient/Guardian was advised if they have not already done so to contact the registration department to sign all necessary forms in order for [place] to release information regarding their care.   Consent: Patient/Guardian gives verbal consent for treatment and assignment of benefits for services provided during this visit. Patient/Guardian expressed understanding and agreed to proceed.  I provided 180 minutes of non-face-to-face time during this encounter.   Shade Flood, LCSW, LCAS 05/06/22

## 2022-05-07 ENCOUNTER — Other Ambulatory Visit (HOSPITAL_COMMUNITY): Payer: Medicaid Other | Admitting: Psychiatry

## 2022-05-07 DIAGNOSIS — F332 Major depressive disorder, recurrent severe without psychotic features: Secondary | ICD-10-CM

## 2022-05-07 NOTE — Progress Notes (Signed)
Virtual Visit via Video Note   I connected with Samantha Romero on 05/07/22 at  9:00 AM EDT by a video enabled telemedicine application and verified that I am speaking with the correct person using two identifiers.   At orientation to the IOP program, Case Manager discussed the limitations of evaluation and management by telemedicine and the availability of in person appointments. The patient expressed understanding and agreed to proceed with virtual visits throughout the duration of the program.   Location:  Patient: Patient Home Provider: [facility]   History of Present Illness: MDD with anxious distress    Observations/Objective: Check In: Case Manager checked in with all participants to review discharge dates, insurance authorizations, work-related documents and needs from the treatment team regarding medications. Samantha Romero stated needs and engaged in discussion.    Initial Therapeutic Activity: Counselor facilitated a check-in with Samantha Romero to assess for safety, sobriety and medication compliance.  Counselor also inquired about Samantha Romero's current emotional ratings, as well as any significant changes in thoughts, feelings or behavior since previous check in.  Samantha Romero presented for session on time and was alert, oriented x5, with no evidence or self-report of active SI/HI or A/V H.  Samantha Romero reported compliance with medication and denied use of alcohol or illicit substances.  Samantha Romero reported scores of 4/10 for depression, 2/10 for anxiety, and 0/10 for anger/irritability.  Samantha Romero denied any recent outbursts or panic attacks.  Samantha Romero reported that a recent success has been managing healthier social media boundaries each day, since this can be a trigger.  Samantha Romero reported that a recent struggle was learning that much of her family contracted COVID over the past few days, which led her to cancel plans for the weekend with them.  Samantha Romero reported that her goal today is to spend time with her partner  instead.         Second Therapeutic Activity: Psycho-educational portion of group was provided by Christie Beckers, Mudlogger of community education with [employer].  Samantha Romero provided information on history of her local agency, mission statement, and the variety of unique services offered which group members might find beneficial to engage in, including both virtual and in-person support groups, as well as peer support program for mentoring.  Samantha Romero offered time to answer member's questions regarding services and encouraged them to consider utilizing these services to assist in working towards their individual wellness goals.  Intervention was effective, as evidenced by Samantha Romero participating in discussion with speaker on the subject, reporting that she would be interested in taking advantage of some of Samantha Romero services if the wait list is not too long.       Third Therapeutic Activity: Counselor introduced topic of creating mental health maintenance plan today.  Counselor provided handout on subject to members, which stressed the importance of maintaining one's mental health in a similar way to using diet and exercise to ensure physical health.  Counselor walked members through process of identifying triggers which could worsen symptoms, including specific people, places, and things one needs to avoid.  Members were also tasked with identifying warning signs such as thoughts, feelings, or behaviors which could indicate mental health is at increased risk.  Counselor also facilitated conversation on self-care activities and coping strategies which members have previously utilized in the past, are currently using in daily routine, or plan to use soon to assist with managing problems or symptoms when/if they appear.  Counselor encouraged members to revisit their maintenance plan often and make changes as needed  to ensure day to day stability.  Intervention was effective, as evidenced by Samantha Romero  participating in activity and creating a comprehensive plan, including identification of triggers such as refilling her medicine, sorting through large volumes of mail, hearing car doors open and close, or experiencing financial stress, and warning signs including neglecting personal hygiene (i.e. showers, brushing teeth, etc), and overeating as a means to cope with difficult feelings.  Samantha Romero also reported that she would make an effort to set aside time for self-care activities such as getting out of the house more often to go shopping or grab tea,  and use coping skills such as deep breathing to manage stressors.    Assessment and Plan: Counselor recommends that Samantha Romero remain in IOP treatment to better manage mental health symptoms, ensure stability and pursue completion of treatment plan goals. Counselor recommends adherence to crisis/safety plan, taking medications as prescribed, and following up with medical professionals if any issues arise.   Follow Up Instructions: Counselor will send Webex link for next session. Samantha Romero was advised to call back or seek an in-person evaluation if the symptoms worsen or if the condition fails to improve as anticipated.   Collaboration of Care:   Medication Management AEB Dr. Fatima Sanger or Ricky Ala, NP                                          Case Manager AEB Dellia Nims, CNA   Patient/Guardian was advised Release of Information must be obtained prior to any record release in order to collaborate their care with an outside provider. Patient/Guardian was advised if they have not already done so to contact the registration department to sign all necessary forms in order for [place] to release information regarding their care.   Consent: Patient/Guardian gives verbal consent for treatment and assignment of benefits for services provided during this visit. Patient/Guardian expressed understanding and agreed to proceed.  I provided 180 minutes of non-face-to-face  time during this encounter.   Shade Flood, LCSW, LCAS 05/07/22

## 2022-05-10 ENCOUNTER — Other Ambulatory Visit (HOSPITAL_COMMUNITY): Payer: Medicaid Other | Admitting: Licensed Clinical Social Worker

## 2022-05-10 DIAGNOSIS — F332 Major depressive disorder, recurrent severe without psychotic features: Secondary | ICD-10-CM | POA: Diagnosis not present

## 2022-05-10 NOTE — Progress Notes (Signed)
Virtual Visit via Video Note   I connected with Samantha Romero on 05/10/22 at  9:00 AM EDT by a video enabled telemedicine application and verified that I am speaking with the correct person using two identifiers.   At orientation to the IOP program, Case Manager discussed the limitations of evaluation and management by telemedicine and the availability of in person appointments. The patient expressed understanding and agreed to proceed with virtual visits throughout the duration of the program.   Location:  Patient: Patient Home Provider: [facility]    History of Present Illness: MDD with anxious distress    Observations/Objective: Check In: Case Manager checked in with all participants to review discharge dates, insurance authorizations, work-related documents and needs from the treatment team regarding medications. Samantha Romero stated needs and engaged in discussion.    Initial Therapeutic Activity: Counselor facilitated a check-in with Samantha Romero to assess for safety, sobriety and medication compliance.  Counselor also inquired about Samantha Romero's current emotional ratings, as well as any significant changes in thoughts, feelings or behavior since previous check in.  Samantha Romero presented for session on time and was alert, oriented x5, with no evidence or self-report of active SI/HI or A/V H.  Samantha Romero reported compliance with medication and denied use of alcohol or illicit substances.  Samantha Romero reported scores of 3/10 for depression, 7/10 for anxiety, and 0/10 for anger/irritability.  Samantha Romero denied any recent outbursts.  Samantha Romero reported that a recent success was celebrating the super bowl halftime event with friends via facetime.  Samantha Romero reported that a recent struggle was experiencing a panic attack this morning, stating "I'm not sure what caused that.  I was feeling a little uneasy over the weekend".  Samantha Romero reported that her goal today is to do research on hobbies that interest her and could be explored in  her downtime.        Second Therapeutic Activity: Counselor engaged the group in discussion on managing work/life balance today to improve mental health and wellness.  Counselor explained how finding balance between responsibilities at home and work place can be challenging, and lead to increased stress.  Counselor facilitated discussion on what challenges members are currently, or have historically faced.  Counselor also discussed strategies for improving work/life balance while members work on their mental health during treatment.  Some of these included keeping track of time management; creating a list of priorities and scaling importance; setting realistic, measurable goals each day; establishing boundaries; taking care of health needs; and nurturing relationships at home and work for support.  Counselor inquired about areas where members feel they are excelling, as well as areas they could focus on during treatment. Intervention was effective, as evidenced by Samantha Romero actively participating in discussion on topic and reporting that she frequently felt 'time poor' as a result of her demanding work schedule, stating "I don't have time to take care of myself and get things done.  I was glued to my desk and even developed a heart condition".  Samantha Romero reported that she experienced several symptoms of burnout, including changing appetite, sleep patterns, withdrawing from responsibilities, feeling overwhelmed, having frequent headaches, and feeling tired and drained.  Samantha Romero reported that there have also been numerous warning signs such as neglecting social aspects of her life and regularly pulling out of socials engagements with supports, and not making herself available to others for quality time.  Samantha Romero was receptive to suggestions offered today for addressing work life imbalance, including keeping an activities time log to monitor work  life balance more closely, planning her day ahead each morning with a digital  schedule to make it more predictable, setting healthier boundaries at home in order to 'leave work at work', and adjusting diet to improve overall energy, focus, and wellbeing.    Assessment and Plan: Counselor recommends that Samantha Romero remain in IOP treatment to better manage mental health symptoms, ensure stability and pursue completion of treatment plan goals. Counselor recommends adherence to crisis/safety plan, taking medications as prescribed, and following up with medical professionals if any issues arise.   Follow Up Instructions: Counselor will send Webex link for next session. Samantha Romero was advised to call back or seek an in-person evaluation if the symptoms worsen or if the condition fails to improve as anticipated.   Collaboration of Care:   Medication Management AEB Dr. Fatima Sanger or Ricky Ala, NP                                          Case Manager AEB Dellia Nims, CNA   Patient/Guardian was advised Release of Information must be obtained prior to any record release in order to collaborate their care with an outside provider. Patient/Guardian was advised if they have not already done so to contact the registration department to sign all necessary forms in order for [place] to release information regarding their care.   Consent: Patient/Guardian gives verbal consent for treatment and assignment of benefits for services provided during this visit. Patient/Guardian expressed understanding and agreed to proceed.  I provided 180 minutes of non-face-to-face time during this encounter.   Shade Flood, LCSW, LCAS 05/10/22

## 2022-05-11 ENCOUNTER — Ambulatory Visit: Payer: Self-pay | Admitting: Nurse Practitioner

## 2022-05-11 ENCOUNTER — Other Ambulatory Visit (HOSPITAL_COMMUNITY): Payer: Medicaid Other | Admitting: Licensed Clinical Social Worker

## 2022-05-11 DIAGNOSIS — F332 Major depressive disorder, recurrent severe without psychotic features: Secondary | ICD-10-CM | POA: Diagnosis not present

## 2022-05-11 NOTE — Progress Notes (Signed)
Virtual Visit via Video Note   I connected with Samantha Romero on 05/11/22 at  9:00 AM EDT by a video enabled telemedicine application and verified that I am speaking with the correct person using two identifiers.   At orientation to the IOP program, Case Manager discussed the limitations of evaluation and management by telemedicine and the availability of in person appointments. The patient expressed understanding and agreed to proceed with virtual visits throughout the duration of the program.   Location:  Patient: Patient Home Provider: [facility]   History of Present Illness: MDD with anxious distress    Observations/Objective: Check In: Case Manager checked in with all participants to review discharge dates, insurance authorizations, work-related documents and needs from the treatment team regarding medications. Samantha Romero stated needs and engaged in discussion.    Initial Therapeutic Activity: Counselor facilitated a check-in with Samantha Romero to assess for safety, sobriety and medication compliance.  Counselor also inquired about Samantha Romero's current emotional ratings, as well as any significant changes in thoughts, feelings or behavior since previous check in.  Samantha Romero presented for session on time and was alert, oriented x5, with no evidence or self-report of active SI/HI or A/V H.  Samantha Romero reported compliance with medication and denied use of alcohol or illicit substances.  Samantha Romero reported scores of 5/10 for depression, 6/10 for anxiety, and 0/10 for anger/irritability.  Samantha Romero denied any recent outbursts or panic attacks.  Samantha Romero reported that a recent success was taking out the trash and making some calls yesterday.  Samantha Romero reported that a recent struggle was dealing with the foul weather yesterday, which impacted her mood.  Samantha Romero reported that her goal today is to prepare for valentines day tomorrow by making a grocery list, and picking up gifts for her partner.         Second Therapeutic  Activity: Counselor introduced topic of anger management today.  Counselor virtually shared a handout with members on this subject featuring a variety of coping skills, and facilitated discussion on these approaches.  Examples included raising awareness of anger triggers, practicing deep breathing, keeping an anger log to better understand episodes, using diversion activities to distract oneself for 30 minutes, taking a time out when necessary, and being mindful of warning signs tied to thoughts or behavior.  Counselor inquired about which techniques group members have used before, what has proved to be helpful, what their unique warning signs might be, as well as what they will try out in the future to assist with de-escalation.  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  I don't want to yell at anyone, so I swallow it".   Samantha Romero reported that her triggers include dealing with stress at work, traffic troubles, feeling treated unfairly, experiencing prejudice, or feeling her time is being wasted.  Samantha Romero reported that warning signs include blurred vision, chest tightening, rapid speed, disorientation, increased heart rate and blood pressure, digestive issues, and headaches.  Samantha Romero reported that she will work to manage anger more effectively by using coping skills such as processing anger/outburst events with an anger journal, try to stop taking small issues too personally, and engaging in a form of exercise as an outlet for anger when it peaks.  Samantha Romero stated "  I need to apply this to myself and others, and be aware of what's under the surface. That could help me avoid going over the top".  Assessment and  Plan: Counselor recommends that [place] remain in IOP treatment to better manage mental health symptoms, ensure stability and pursue completion of treatment plan goals. Counselor recommends adherence to crisis/safety plan, taking medications as prescribed, and following up with medical professionals if any issues arise.   Follow Up Instructions: Counselor will send Webex link for next session. Samantha Romero was advised to call back or seek an in-person evaluation if the symptoms worsen or if the condition fails to improve as anticipated.   Collaboration of Care:   Medication Management AEB Dr. Fatima Sanger or Ricky Ala, NP                                          Case Manager AEB Dellia Nims, CNA   Patient/Guardian was advised Release of Information must be obtained prior to any record release in order to collaborate their care with an outside provider. Patient/Guardian was advised if they have not already done so to contact the registration department to sign all necessary forms in order for [place] to release information regarding their care.   Consent: Patient/Guardian gives verbal consent for treatment and assignment of benefits for services provided during this visit. Patient/Guardian expressed understanding and agreed to proceed.  I provided 180 minutes of non-face-to-face time during this encounter.   Shade Flood, [place], LCAS 05/11/22

## 2022-05-12 ENCOUNTER — Other Ambulatory Visit (HOSPITAL_COMMUNITY): Payer: Medicaid Other | Admitting: Psychiatry

## 2022-05-12 ENCOUNTER — Ambulatory Visit: Payer: Self-pay | Admitting: Psychology

## 2022-05-12 DIAGNOSIS — F332 Major depressive disorder, recurrent severe without psychotic features: Secondary | ICD-10-CM

## 2022-05-12 NOTE — Progress Notes (Signed)
Virtual Visit via Video Note   I connected with [name] on 05/12/22 at  9:00 AM EDT by a video enabled telemedicine application and verified that I am speaking with the correct person using two identifiers.   At orientation to the IOP program, Case Manager discussed the limitations of evaluation and management by telemedicine and the availability of in person appointments. The patient expressed understanding and agreed to proceed with virtual visits throughout the duration of the program.   Location:  Patient: Patient Home Provider: OPT [place] Office   History of Present Illness: MDD with anxious distress    Observations/Objective: Check In: Case Manager checked in with all participants to review discharge dates, insurance authorizations, work-related documents and needs from the treatment team regarding medications. Erick stated needs and engaged in discussion.    Initial Therapeutic Activity: Counselor facilitated a check-in with Tauni to assess for safety, sobriety and medication compliance.  Counselor also inquired about Evelisse's current emotional ratings, as well as any significant changes in thoughts, feelings or behavior since previous check in.  Sola presented for session on time and was alert, oriented x5, with no evidence or self-report of active SI/HI or A/V H.  Calyse reported compliance with medication and denied use of alcohol or illicit substances.  Sadonna reported scores of 4/10 for depression, 6/10 for anxiety, and 0/10 for anger/irritability.  Sharon denied any recent outbursts or panic attacks.  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  She reported that she also took time for self-care yesterday, including practicing gratitude journaling.  Kaesha denied any new struggles at this time.   Maris reported that her goal today is to go out to lunch with a friend.        Second Therapeutic Activity: Counselor introduced Einar Grad, [employer] Pharmacist, to provide psychoeducation on topic of medication compliance with members today.  Jiles Garter provided psychoeducation on classes of medications such as antidepressants, antipsychotics, what symptoms they are intended to treat, and any side effects one might encounter while on a particular prescription.  Time was allowed for clients to ask any questions they might have of [facility] regarding this specialty.  Intervention was effective, as evidenced by Buckley participating in discussion with speaker on the subject, reporting that she has been taking Wellbutrin and Cymbalta for roughly a year now to address symptoms related to her depression, but has still struggled with getting up each day and feeling motivated.  Rosann was receptive to feedback from pharmacist on potential benefits, and downsides to current regimen, as well as additional medication options to explore with her provider if issues remain.    Third Therapeutic Activity: Counselor also acknowledged a graduating group member by prompting this member to reflect on progress made since beginning the [facility] program, notable takeaways from sessions attended, challenges overcome, and plan for continued care following discharge. Counselor and group members shared observations of growth, words of encouragement and support as this member transitioned out of the program today.  Intervention was effective, as evidenced by Kinzlee participating in activity, reporting that she has seen considerable progress in the other member's mood and motivation since starting [facility]HIOP, and encouraged her to continue prioritizing her mental health by staying engaged in some form of therapy.    Assessment and Plan: Counselor recommends that Rutledge remain in IOP  treatment to better manage mental health symptoms, ensure stability and  pursue completion of treatment plan goals. Counselor recommends adherence to crisis/safety plan, taking medications as prescribed, and following up with medical professionals if any issues arise.   Follow Up Instructions: Counselor will send Webex link for next session. Minervia was advised to call back or seek an in-person evaluation if the symptoms worsen or if the condition fails to improve as anticipated.   Collaboration of Care:   Medication Management AEB Dr. Fatima Sanger or Ricky Ala, NP                                          Case Manager AEB Dellia Nims, CNA   Patient/Guardian was advised Release of Information must be obtained prior to any record release in order to collaborate their care with an outside provider. Patient/Guardian was advised if they have not already done so to contact the registration department to sign all necessary forms in order for [place] to release information regarding their care.   Consent: Patient/Guardian gives verbal consent for treatment and assignment of benefits for services provided during this visit. Patient/Guardian expressed understanding and agreed to proceed.  I provided 180 minutes of non-face-to-face time during this encounter.   Shade Flood, LCSW, LCAS 05/12/22

## 2022-05-13 ENCOUNTER — Telehealth (HOSPITAL_COMMUNITY): Payer: Self-pay | Admitting: Psychiatry

## 2022-05-13 ENCOUNTER — Other Ambulatory Visit (HOSPITAL_COMMUNITY): Payer: Medicaid Other | Admitting: Psychiatry

## 2022-05-14 ENCOUNTER — Telehealth (HOSPITAL_COMMUNITY): Payer: Self-pay | Admitting: Psychiatry

## 2022-05-14 ENCOUNTER — Other Ambulatory Visit: Payer: Self-pay | Admitting: Nurse Practitioner

## 2022-05-14 ENCOUNTER — Other Ambulatory Visit (HOSPITAL_COMMUNITY): Payer: Medicaid Other | Admitting: Psychiatry

## 2022-05-14 DIAGNOSIS — F332 Major depressive disorder, recurrent severe without psychotic features: Secondary | ICD-10-CM | POA: Diagnosis not present

## 2022-05-14 DIAGNOSIS — F418 Other specified anxiety disorders: Secondary | ICD-10-CM

## 2022-05-14 NOTE — Telephone Encounter (Signed)
D:  Discussed further with patient about her insurance being terminated on 04-12-22.  On 04-23-22 MH-IOP case mgr had placed call to [facility] @ 1058, spoke to rep Lilli Few R.) who stated no pre-cert was needed for MH-IOP.  Ref# [id number]. Yesterday, front desk [facility]) informed the cm that she spoke to Dominica on 05-13-22 @ 8:04 a.m at [phone] who states under current ID#, pt doesn't have medical insurance; it termed on 04-02-22.  [id number]. Pt states whenever she started MH-IOP, she was leaving that employer and transferring to a new one.  "My old employer agreed to cover my insurance through March 2024 and I should be getting a new card."  Pt states she will contact [facility] today and obtain her new ID number and call case manager with it.  A:  Informed [facility] (front desk).

## 2022-05-14 NOTE — Progress Notes (Signed)
Virtual Visit via Video Note   I connected with [name] on 05/14/22 at  9:00 AM EDT by a video enabled telemedicine application and verified that I am speaking with the correct person using two identifiers.   At orientation to the IOP program, Case Manager discussed the limitations of evaluation and management by telemedicine and the availability of in person appointments. The patient expressed understanding and agreed to proceed with virtual visits throughout the duration of the program.   Location:  Patient: Patient Home Provider: OPT [place] Office   History of Present Illness: MDD with anxious distress    Observations/Objective: Check In: Case Manager checked in with all participants to review discharge dates, insurance authorizations, work-related documents and needs from the treatment team regarding medications. Sybol stated needs and engaged in discussion.    Initial Therapeutic Activity: Counselor facilitated a check-in with Delesia to assess for safety, sobriety and medication compliance.  Counselor also inquired about Kenzlee's current emotional ratings, as well as any significant changes in thoughts, feelings or behavior since previous check in.  Charmion presented for session on time and was alert, oriented x5, with no evidence or self-report of active SI/HI or A/V H.  Katasha reported compliance with medication and denied use of alcohol or illicit substances.  Aalaysia reported scores of 9/10 for depression, 9/10 for anxiety, and 0/10 for anger/irritability.  Kenley denied any recent outbursts or panic attacks.  Oluwademilade reported that a recent struggle was missing group yesterday due to "Feeling really, really down.  I was just miserable and overwhelmed and should have asked for help".  Kaydie reported that her goal is to be more open and honest about her feelings with supports when she is feeling down so she can avoid wearing an 'emotional mask'.       Second Therapeutic Activity: Counselor  introduced topic of building a social support network today.  Counselor explained how this can be defined as having a having a group of healthy people in one's life you can talk to, spend time with, and get help from to improve both mental and physical health.  Counselor noted that some barriers can make it difficult to connect with other people, including the presence of anxiety or depression, or moving to an unfamiliar area.  Group members were asked to assess the current state of their support network, and identify ways that this could be improved.  Tips were given on how to address previously noted barriers, such as strengthening social skills, using relaxation techniques to reduce anxiety, scheduling social time each week, and/or exploring social events nearby which could increase chances of meeting new supports.  Members were also encouraged to consider getting closer to people they already know through suggestions such as outreaching someone by text, email or phone call if they haven't spoken in awhile, doing something nice for a friend/family member unexpectedly, and/or inviting someone over for a game/movie/dinner night.  Intervention was effective, as evidenced by Mistie actively participating in discussion on the subject, and stating "Right now I feel like I need to expand it.  I worry about overwhelming some of the people in my circle with all my problems".  She reported that her goal will be to curb isolation by getting out of the home more often to go to church, the gym, volunteer opportunities, and other social events when possible so that she can meet new people and strengthen existing friendships that have fallen by the wayside.  She reported that virtual meetups  would be appealing to start, such as virtual book clubs as she works on getting comfortable in public spaces.    Assessment and Plan: Counselor recommends that Salem remain in IOP treatment to better manage mental health symptoms,  ensure stability and pursue completion of treatment plan goals. Counselor recommends adherence to crisis/safety plan, taking medications as prescribed, and following up with medical professionals if any issues arise.   Follow Up Instructions: Counselor will send Webex link for next session. Acasia was advised to call back or seek an in-person evaluation if the symptoms worsen or if the condition fails to improve as anticipated.   Collaboration of Care:   Medication Management AEB Dr. Fatima Sanger or Ricky Ala, NP                                          Case Manager AEB Dellia Nims, CNA   Patient/Guardian was advised Release of Information must be obtained prior to any record release in order to collaborate their care with an outside provider. Patient/Guardian was advised if they have not already done so to contact the registration department to sign all necessary forms in order for [place] to release information regarding their care.   Consent: Patient/Guardian gives verbal consent for treatment and assignment of benefits for services provided during this visit. Patient/Guardian expressed understanding and agreed to proceed.  I provided 180 minutes of non-face-to-face time during this encounter.   Shade Flood, LCSW, LCAS 05/14/22

## 2022-05-17 ENCOUNTER — Encounter: Payer: Self-pay | Admitting: Adult Health

## 2022-05-17 ENCOUNTER — Ambulatory Visit (HOSPITAL_COMMUNITY): Payer: Self-pay

## 2022-05-17 ENCOUNTER — Ambulatory Visit (INDEPENDENT_AMBULATORY_CARE_PROVIDER_SITE_OTHER): Payer: Self-pay | Admitting: Adult Health

## 2022-05-17 ENCOUNTER — Telehealth (HOSPITAL_COMMUNITY): Payer: Self-pay | Admitting: Psychiatry

## 2022-05-17 VITALS — BP 120/95 | HR 99 | Ht 62.0 in | Wt 315.0 lb

## 2022-05-17 DIAGNOSIS — G4700 Insomnia, unspecified: Secondary | ICD-10-CM

## 2022-05-17 DIAGNOSIS — F909 Attention-deficit hyperactivity disorder, unspecified type: Secondary | ICD-10-CM

## 2022-05-17 DIAGNOSIS — F411 Generalized anxiety disorder: Secondary | ICD-10-CM

## 2022-05-17 DIAGNOSIS — F332 Major depressive disorder, recurrent severe without psychotic features: Secondary | ICD-10-CM

## 2022-05-17 MED ORDER — LISDEXAMFETAMINE DIMESYLATE 40 MG PO CAPS: 40.0000 mg | ORAL_CAPSULE | Freq: Every day | ORAL | 0 refills | Status: DC

## 2022-05-17 MED ORDER — ALPRAZOLAM 1 MG PO TABS: 1.0000 mg | ORAL_TABLET | Freq: Two times a day (BID) | ORAL | 0 refills | Status: DC | PRN

## 2022-05-17 NOTE — Progress Notes (Signed)
[facility] MD/PA/NP Initial Note  05/17/2022 9:30 AM CAETLYN AY  MRN:  [id number]  Chief Complaint:   HPI:  Patient seen today for initial psychiatric evaluation.   Referred by [place] IOP program.  Describes mood today as "so-so". Pleasant. Tearful at times. Mood symptoms - reports depression and anxiety. Denies irritability. Reports worry and rumination. Reports over thinking - "all the time". Reports obsessive thoughts and acts. Experiencing a lot of "shame" with her current emotional state. Mood is lower. Has been unable to work since March 02, 2022 due to emotional instability. Feels like current medications are helpful, but is still "struggling". Stating "I'm still experiencing a lot of mood fluctuations". Working IOP program at [facility] over the past 3 weeks and feels like it has been helpful. Also seeing therapist Buena Irish. Does not feel like she is able to return to the work setting at this time. Stable interest and motivation. Taking medications as prescribed.  Energy levels lower. Active, does not have a regular exercise routine.   Enjoys some usual interests and activities. In a healthy long term relationship. Has family local - mother, brother, and extended family. Spending time with family. Has friends, but has been avoiding them. Has a difficult time leaving the house. Appetite adequate. Weight gain over the past few months. Sleeps well most nights. Averages 4 hours. Focus and concentration difficulties - taking Vyvanse daily and feels it is helpful. Completing tasks. Managing aspects of household. 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 Denies SI or HI.  Denies AH or VH. Denies self harm. Denies substance use.  Att[facility] IOP program [facility].  Previous medication trials: Topamax  Visit Diagnosis:    ICD-10-CM   1. Major depressive disorder, recurrent episode, severe with anxious distress ([place])  F33.2     2. GAD  (generalized anxiety disorder)  F41.1 ALPRAZolam (XANAX) 1 MG tablet    3. Insomnia, unspecified type  G47.00     4. Attention deficit hyperactivity disorder (ADHD), unspecified ADHD type  F90.9 lisdexamfetamine (VYVANSE) 40 MG capsule      Past Psychiatric History: Denies psychiatric hospitalization.   Past Medical History:  Past Medical History:  Diagnosis Date   Anxiety    panic attacks   Asthma    CHF (congestive heart failure), NYHA class II, acute, systolic ([place]) [id number]   Depression    Fibromyalgia    Hypertension    no meds currently   No pertinent past medical history    Pneumonia    hx of    Sinus problem     Past Surgical History:  Procedure Laterality Date   ESOPHAGOGASTRODUODENOSCOPY N/A 11/26/2014   Procedure: ESOPHAGOGASTRODUODENOSCOPY (EGD);  Surgeon: Alphonsa Overall, MD;  Location: [facility];  Service: General;  Laterality: N/A;   GASTRIC BANDING PORT REVISION  08/24/11   LAPAROSCOPIC GASTRIC BANDING  12/26/2007   LAPAROSCOPIC LYSIS OF ADHESIONS N/A 01/06/2015   Procedure: LAPAROSCOPIC LYSIS OF ADHESIONS;  Surgeon: Johnathan Hausen, MD;  Location: [facility];  Service: General;  Laterality: N/A;   LAPAROSCOPIC REPAIR AND REMOVAL OF GASTRIC BAND N/A 01/06/2015   Procedure: LAPAROSCOPIC RESIGHTING OF GASTRIC Band PORT;  Surgeon: Johnathan Hausen, MD;  Location: [facility];  Service: General;  Laterality: N/A;   NO PAST SURGERIES      Family Psychiatric History: Denies any family history of mental illness.   Family History:  Family History  Problem Relation Age of Onset   Ulcers Mother  Hypertension Mother    Cancer Maternal Uncle        lung   Cancer Maternal Grandfather        lung and back    Social History:  Social History   Socioeconomic History   Marital status: Single    Spouse name: Not on file   Number of children: Not on file   Years of education: Not on file   Highest education level: Not on file  Occupational History   Not on file  Tobacco  Use   Smoking status: Never   Smokeless tobacco: Never  Vaping Use   Vaping Use: Never used  Substance and Sexual Activity   Alcohol use: Never   Drug use: Never   Sexual activity: Not Currently    Birth control/protection: I.U.D.    Comment: Mirena  Other Topics Concern   Not on file  Social History Narrative   Not on file   Social Determinants of Health   Financial Resource Strain: Not on file  Food Insecurity: Not on file  Transportation Needs: Not on file  Physical Activity: Not on file  Stress: Not on file  Social Connections: Not on file    Allergies:  Allergies  Allergen Reactions   Grass Extracts [Gramineae Pollens]     Nasal congestion, post nasal drip    Shellfish Allergy Itching    In throat only.    Metabolic Disorder Labs: Lab Results  Component Value Date   HGBA1C 5.9 (H) 01/06/2022   No results found for: "PROLACTIN" Lab Results  Component Value Date   CHOL 158 11/19/2019   TRIG 93 11/19/2019   HDL 50 11/19/2019   CHOLHDL 3.2 11/19/2019   LDLCALC 91 11/19/2019   LDLCALC 82 08/17/2019   Lab Results  Component Value Date   TSH 1.040 04/14/2022   TSH 1.470 05/08/2020    Therapeutic Level Labs: No results found for: "LITHIUM" No results found for: "VALPROATE" No results found for: "CBMZ"  Current Medications: Current Outpatient Medications  Medication Sig Dispense Refill   albuterol (PROVENTIL) (2.5 MG/3ML) 0.083% nebulizer solution Take 3 mLs (2.5 mg total) by nebulization every 6 (six) hours as needed for wheezing or shortness of breath. 150 mL 1   albuterol (VENTOLIN HFA) 108 (90 Base) MCG/ACT inhaler Inhale 2 puffs into the lungs every 6 (six) hours as needed for wheezing or shortness of breath. 8 g 2   ALPRAZolam (XANAX) 1 MG tablet Take 1 tablet (1 mg total) by mouth 2 (two) times daily as needed for anxiety. 20 tablet 0   azelastine (ASTELIN) 0.1 % nasal spray Place 2 sprays into both nostrils 2 (two) times daily. 30 mL 5    buPROPion (WELLBUTRIN XL) 150 MG 24 hr tablet Take 3 tablets (450 mg total) by mouth daily. 270 tablet 2   carvedilol (COREG) 25 MG tablet TAKE 1 TABLET (25 MG TOTAL) BY MOUTH TWICE A DAY WITH MEALS 180 tablet 3   DULoxetine (CYMBALTA) 30 MG capsule Take 1 capsule (30 mg total) by mouth daily. 30 capsule 0   DULoxetine (CYMBALTA) 60 MG capsule TAKE 1 CAPSULE BY MOUTH EVERY DAY 90 capsule 1   empagliflozin (JARDIANCE) 10 MG TABS tablet TAKE ONE TABLET BY MOUTH DAILY BEFORE BREAKFAST 90 tablet 3   Fluticasone-Salmeterol 232-14 MCG/ACT AEPB Inhale 1 puff into the lungs in the morning and at bedtime. 1 each 5   furosemide (LASIX) 80 MG tablet Take 1 tablet (80 mg total) by mouth daily. Melbeta  tablet 3   levocetirizine (XYZAL) 5 MG tablet TAKE 1 TABLET BY MOUTH EVERY DAY IN THE EVENING 90 tablet 0   levonorgestrel (MIRENA, 52 MG,) 20 MCG/DAY IUD      lisdexamfetamine (VYVANSE) 40 MG capsule Take 1 capsule (40 mg total) by mouth daily. 30 capsule 0   meloxicam (MOBIC) 15 MG tablet TAKE 1 TABLET BY MOUTH EVERY DAY AS NEEDED FOR PAIN 30 tablet 0   montelukast (SINGULAIR) 10 MG tablet TAKE 1 TABLET BY MOUTH EVERYDAY AT BEDTIME 90 tablet 2   omeprazole (PRILOSEC) 40 MG capsule TAKE 1 CAPSULE BY MOUTH EVERY DAY (Patient not taking: Reported on 03/24/2022) 90 capsule 1   promethazine-dextromethorphan (PROMETHAZINE-DM) 6.25-15 MG/5ML syrup Take 5 mLs by mouth at bedtime as needed for cough. 118 mL 0   sacubitril-valsartan (ENTRESTO) 97-103 MG Take 1 tablet by mouth 2 (two) times daily. 60 tablet 11   spironolactone (ALDACTONE) 25 MG tablet Take 1 tablet (25 mg total) by mouth daily. 90 tablet 3   traZODone (DESYREL) 50 MG tablet Take 1 tablet (50 mg total) by mouth at bedtime. 30 tablet 1   Vitamin D, Ergocalciferol, (DRISDOL) 1.25 MG (50000 UNIT) CAPS capsule Take 50,000 Units by mouth once a week.     No current facility-administered medications for this visit.    Medication Side Effects: none  Orders  placed this visit:  No orders of the defined types were placed in this encounter.   Psychiatric Specialty Exam:  Review of Systems  Musculoskeletal:  Negative for gait problem.  Neurological:  Negative for tremors.  Psychiatric/Behavioral:         Please refer to HPI    Blood pressure (!) 120/95, pulse 99, height 5' 2"$  (1.575 m), weight (!) 315 lb (142.9 kg).Body mass index is 57.61 kg/m.  General Appearance: Casual and Neat  Eye Contact:  Good  Speech:  Clear and Coherent and Normal Rate  Volume:  Normal  Mood:  Anxious and Depressed  Affect:  Appropriate and Congruent  Thought Process:  Coherent and Descriptions of Associations: Intact  Orientation:  Full (Time, Place, and Person)  Thought Content: Logical   Suicidal Thoughts:  No  Homicidal Thoughts:  No  Memory:  WNL  Judgement:  Good  Insight:  Good  Psychomotor Activity:  Normal  Concentration:  Concentration: Fair and Attention Span: Fair  Recall:  Good  Fund of Knowledge: Good  Language: Good  Assets:  Communication Skills Desire for Improvement Financial Resources/Insurance Housing Intimacy Leisure Time Physical Health Resilience Social Support Talents/Skills Transportation Vocational/Educational  ADL's:  Intact  Cognition: WNL  Prognosis:  Good   Screenings:  PHQ2-9    [place] Office Visit from 03/24/2022 in [place] from 02/25/2022 in [facility] Office Visit from 01/06/2022 in [facility] Nutrition from 04/20/2021 in [place] at [place] from 01/26/2021 in [facility]  PHQ-2 Total Score 3 5 1 $ 0 0  PHQ-9 Total Score 16 19 10 $ -- [place] ED from 04/14/2022 in [facility] ED from 10/08/2020  in [facility] Department at [place] No Risk No Risk       Receiving Psychotherapy: Yes   Treatment Plan/Recommendations:   Plan:  Patient  currently out of work and is totally disabled. Out of work 04/14/2022 through 07/14/2022.  Involved in daily IOP   Seeing therapist - Debbe Bales  PDMP reviewed  Trazadone 46m at hs as needed - twice a week. Cymbalta 60/30 daily - 334mdose recently added Wellbutrin XL 15095m 3 daily Xanax 1mg33m to twice a day - usually taking it once to twice a week Increase Vyvanse 30mg62m40mg 30my morning  Time spent with patient was 60 minutes. Greater than 50% of face to face time with patient was spent on counseling and coordination of care.    RTC 3/4 weeks  Patient advised to contact office with any questions, adverse effects, or acute worsening in signs and symptoms.   Discussed potential benefits, risk, and side effects of benzodiazepines to include potential risk of tolerance and dependence, as well as possible drowsiness. Advised patient not to drive if experiencing drowsiness and to take lowest possible effective dose to minimize risk of dependence and tolerance.   Discussed potential benefits, risks, and side effects of stimulants with patient to include increased heart rate, palpitations, insomnia, increased anxiety, increased irritability, or decreased appetite.  Instructed patient to contact office if experiencing any significant tolerability issues.    ReginaAloha Gell

## 2022-05-18 ENCOUNTER — Other Ambulatory Visit (HOSPITAL_COMMUNITY): Payer: Medicaid Other | Attending: Psychiatry | Admitting: Licensed Clinical Social Worker

## 2022-05-18 ENCOUNTER — Encounter (HOSPITAL_COMMUNITY): Payer: Self-pay | Admitting: Licensed Clinical Social Worker

## 2022-05-18 DIAGNOSIS — F332 Major depressive disorder, recurrent severe without psychotic features: Secondary | ICD-10-CM

## 2022-05-18 DIAGNOSIS — F419 Anxiety disorder, unspecified: Secondary | ICD-10-CM | POA: Diagnosis not present

## 2022-05-18 DIAGNOSIS — F909 Attention-deficit hyperactivity disorder, unspecified type: Secondary | ICD-10-CM

## 2022-05-18 DIAGNOSIS — F32A Depression, unspecified: Secondary | ICD-10-CM | POA: Insufficient documentation

## 2022-05-18 DIAGNOSIS — G4700 Insomnia, unspecified: Secondary | ICD-10-CM

## 2022-05-18 DIAGNOSIS — F411 Generalized anxiety disorder: Secondary | ICD-10-CM

## 2022-05-18 NOTE — Progress Notes (Signed)
Virtual Visit via Video Note   I connected with [name] on 05/18/22 at  9:00 AM EDT by a video enabled telemedicine application and verified that I am speaking with the correct person using two identifiers.   At orientation to the IOP program, Case Manager discussed the limitations of evaluation and management by telemedicine and the availability of in person appointments. The patient expressed understanding and agreed to proceed with virtual visits throughout the duration of the program.   Location:  Patient: Patient Home Provider: [facility] [facility]   History of Present Illness: MDD with anxious distress    Observations/Objective: Check In: Case Manager checked in with all participants to review discharge dates, insurance authorizations, work-related documents and needs from the treatment team regarding medications. Evamae stated needs and engaged in discussion.    Initial Therapeutic Activity: Counselor facilitated a check-in with Rudie to assess for safety, sobriety and medication compliance.  Counselor also inquired about Orchid's current emotional ratings, as well as any significant changes in thoughts, feelings or behavior since previous check in.  Kaitlyne presented for session on time and was alert, oriented x5, with no evidence or self-report of active SI/HI or A/V H.  Courtlyn reported compliance with medication and denied use of alcohol or illicit substances.  Sana reported scores of 2/10 for depression, 8/10 for anxiety, and 0/10 for anger/irritability.  Katricia denied any recent outbursts.  Wednesday reported that a recent success was getting good sleep overnight, cleaning out her car, and attending a medical appointment that went well.  Olivea reported that a recent struggle was experiencing panic attacks over the past day, stating "I think it was the result of dealing with insurance paperwork.  I don't want to have anything else added to my list to deal with right now".  Madalynne reported  that her goal today is to plan to get her hair and nails done before she visits some friends to catch up for dinner.         Second Therapeutic Activity: Counselor introduced [employer], [place] Chaplain to provide psychoeducation on topic of Grief and Loss with members today.  Estill Bamberg began discussion by checking in with the group about their baseline mood today, general thoughts on what grief means to them and how it has affected them personally in the past.  Estill Bamberg provided information on how the process of grief/loss can differ depending upon one's unique culture, and categories of loss one could experience (i.e. loss of a person, animal, relationship, job, identity, etc).  Estill Bamberg encouraged members to be mindful of how pervasive loss can be, and how to recognize signs which could indicate that this is having an impact on one's overall mental health and wellbeing.  Intervention effectiveness could not be measured, as Santiana did not participate.    Third Therapeutic Activity: Counselor introduced topic of self-care today.  Counselor explained how this can be defined as the things one does to maintain good health and improve well-being.  Counselor provided members with a self-care assessment form to complete.  This handout featured various sub-categories of self-care, including physical, psychological/emotional, social, spiritual, and professional.  Members were asked to rank their engagement in the activities listed for each dimension on a scale of 1-3, with 1 indicating 'Poor', 2 indicating '[place]', and 3 indicating 'Well'.  Counselor invited members to share results of their assessment, and inquired about which areas of self-care they are doing well in, as well as areas that require attention, and how  they plan to begin addressing this during treatment.  Intervention was effective, as evidenced by Odis successfully completing initial 2 sections of assessment and actively engaging in discussion on  subject, reporting that she is excelling in areas such as eating healthy foods, attending preventative medical appointments, and resting when sick, but would benefit from focusing more on areas such as eating regularly, getting enough sleep, and wearing clothes that make her feel good.  Kamariya reported that she would work to improve self-care deficits by eating small meals/snacks throughout the day to keep energy up, adjusting diet to include clothes that boost her self-esteem, going to bed earlier at night to improve overall sleep routine and hygiene.    Assessment and Plan: Karsten will be discharged today following successful completion of MHIOP.  Counselor recommends adherence to crisis/safety plan, taking medications as prescribed, and following up with medical professionals if any issues arise.   Follow Up Instructions: Kearah was advised to call back or seek an in-person evaluation if the symptoms worsen or if the condition fails to improve as anticipated.   Collaboration of Care:   Medication Management AEB Dr. Fatima Sanger or Ricky Ala, NP                                          Case Manager AEB Dellia Nims, CNA   Patient/Guardian was advised Release of Information must be obtained prior to any record release in order to collaborate their care with an outside provider. Patient/Guardian was advised if they have not already done so to contact the registration department to sign all necessary forms in order for [place] to release information regarding their care.   Consent: Patient/Guardian gives verbal consent for treatment and assignment of benefits for services provided during this visit. Patient/Guardian expressed understanding and agreed to proceed.  I provided 180 minutes of non-face-to-face time during this encounter.   Shade Flood, LCSW, LCAS 05/18/22
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
## 2022-05-19 ENCOUNTER — Other Ambulatory Visit (HOSPITAL_COMMUNITY): Payer: Medicaid Other | Attending: Psychiatry | Admitting: Psychiatry

## 2022-05-19 DIAGNOSIS — F332 Major depressive disorder, recurrent severe without psychotic features: Secondary | ICD-10-CM | POA: Diagnosis present
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
## 2022-05-19 NOTE — Progress Notes (Signed)
Virtual Visit via Video Note   I connected with Samantha Romero on 05/19/22 at  9:00 AM EDT by a video enabled telemedicine application and verified that I am speaking with the correct person using two identifiers.   At orientation to the IOP program, Case Manager discussed the limitations of evaluation and management by telemedicine and the availability of in person appointments. The patient expressed understanding and agreed to proceed with virtual visits throughout the duration of the program.   Location:  Patient: Patient Home Provider: [facility]   History of Present Illness: MDD with anxious distress    Observations/Objective: Check In: Case Manager checked in with all participants to review discharge dates, insurance authorizations, work-related documents and needs from the treatment team regarding medications. Samantha Romero stated needs and engaged in discussion.    Initial Therapeutic Activity: Counselor facilitated a check-in with Samantha Romero to assess for safety, sobriety and medication compliance.  Counselor also inquired about Samantha Romero's current emotional ratings, as well as any significant changes in thoughts, feelings or behavior since previous check in.  Samantha Romero presented for session on time and was alert, oriented x5, with no evidence or self-report of active SI/HI or A/V H.  Samantha Romero reported compliance with medication and denied use of alcohol or illicit substances.  Samantha Romero reported scores of 2/10 for depression, 4/10 for anxiety, and 0/10 for anger/irritability.  Samantha Romero denied any recent outbursts.  Samantha Romero reported that a recent success was getting her hair done yesterday, and completing MHIOP today, stating "The tools I got from this group have made a huge difference".  Samantha Romero reported that a recent struggle was feeling frustrated with paperwork yesterday, and having a panic attack as a result.  Samantha Romero reported that her goal today is to go out to lunch with friends to celebrate completion of  MHIOP.       Second Therapeutic Activity: Counselor also acknowledged a graduating group member by prompting this member to reflect on progress made since beginning the [place] program, notable takeaways from sessions attended, challenges overcome, and plan for continued care following discharge. Counselor and group members shared observations of growth, words of encouragement and support as this member transitioned out of the program today.  Intervention was effective, as evidenced by engaging in discussion and expressing appreciation for her time in the program, stating "It has been a very positive journey.  I know when things started I was in a fog.  I couldn't speak the way I wanted to.  I couldn't focus.  I didn't have the words for how I was feeling.  I got tools by listening to other members.  I've grown a lot stronger".  Samantha Romero reported that she is now more assertive, and plans to set healthier boundaries in her life so she can open up honestly about her needs, and how she feels with supports, stating "I can say whatever I need to whomever about what I need and want done".    Third Therapeutic Activity: Counselor introduced Samantha Romero, Samantha Romero, to provide psychoeducation on topic of nutrition with members today.  Samantha Romero virtually shared a comprehensive PowerPoint presentation to guide discussion, which featured the various components of healthy living that influence one's wellbeing, including practicing mindfulness, staying physically active, calm in mood, well-rested, and more.  Samantha Romero explained how good nutrition reinforces positive physical and mental health, and shared a video which explained how food intake affects brain functioning in particular.  Samantha Romero provided advice on how to adjust diet in order to  promote wellbeing during course of treatment, including achieving balanced daily intake along with regular exercise.  Samantha Romero offered the 'plate method' as a tool for proper distribution of protein,  grains, starches, vegetables, fruit, and low calorie drink, in addition to concept of 'mindful eating', and covering current Dietary Guidelines for Americans for more tips.  Samantha Romero inquired about changes members would like to make to their nutrition in order to increase overall wellbeing based upon information shared today, and time was allowed to ask any questions they might have of Samantha Romero regarding her specialty.  Intervention was effective, as evidenced by Samantha Romero participating in discussion with speaker on the subject, reporting that she is mindful of how her diet affects her mood and overall wellbeing.  She reported motivation for including healthier options in her daily diet, eating more regularly, and getting more active each day to improve exercise.  Samantha Romero also participated in stretching activity offered by speaker to aid physical self-care.    Assessment and Plan: Samantha Romero will be discharged today following successful completion of MHIOP.  Counselor recommends adherence to crisis/safety plan, taking medications as prescribed, and following up with medical professionals if any issues arise.   Follow Up Instructions: Samantha Romero was advised to call back or seek an in-person evaluation if the symptoms worsen or if the condition fails to improve as anticipated.   Collaboration of Care:   Medication Management AEB Dr. Fatima Sanger or Ricky Ala, NP                                          Case Manager AEB Dellia Nims, CNA   Patient/Guardian was advised Release of Information must be obtained prior to any record release in order to collaborate their care with an outside provider. Patient/Guardian was advised if they have not already done so to contact the registration department to sign all necessary forms in order for [place] to release information regarding their care.   Consent: Patient/Guardian gives verbal consent for treatment and assignment of benefits for services provided during this visit.  Patient/Guardian expressed understanding and agreed to proceed.  I provided 180 minutes of non-face-to-face time during this encounter.   Shade Flood, LCSW, LCAS 05/19/22

## 2022-05-19 NOTE — Patient Instructions (Signed)
D:  Patient successfully completed virtual MH-IOP.  A:  D/C today.  Follow up with Buena Irish, LCSW (pt needs a f/u appointment) and Deloria Lair, NP on 06-07-22 @ 10:40 a.m.  Strongly recommend support groups through [facility] [phone].  Return to work on 07-15-22; without any restrictions.  R:  Patient receptive.

## 2022-05-20 ENCOUNTER — Ambulatory Visit (HOSPITAL_COMMUNITY): Payer: Self-pay

## 2022-05-21 ENCOUNTER — Ambulatory Visit (HOSPITAL_COMMUNITY): Payer: Self-pay

## 2022-05-24 ENCOUNTER — Ambulatory Visit (HOSPITAL_COMMUNITY): Payer: Self-pay

## 2022-05-26 LAB — COLOGUARD: COLOGUARD: NEGATIVE

## 2022-06-07 ENCOUNTER — Telehealth (HOSPITAL_COMMUNITY): Payer: BC Managed Care – PPO | Admitting: Psychiatry

## 2022-06-07 ENCOUNTER — Ambulatory Visit (INDEPENDENT_AMBULATORY_CARE_PROVIDER_SITE_OTHER): Payer: Self-pay | Admitting: Adult Health

## 2022-06-07 ENCOUNTER — Encounter: Payer: Self-pay | Admitting: Adult Health

## 2022-06-07 DIAGNOSIS — F909 Attention-deficit hyperactivity disorder, unspecified type: Secondary | ICD-10-CM

## 2022-06-07 DIAGNOSIS — F411 Generalized anxiety disorder: Secondary | ICD-10-CM

## 2022-06-07 DIAGNOSIS — G4700 Insomnia, unspecified: Secondary | ICD-10-CM

## 2022-06-07 DIAGNOSIS — F332 Major depressive disorder, recurrent severe without psychotic features: Secondary | ICD-10-CM

## 2022-06-07 MED ORDER — ALPRAZOLAM 1 MG PO TABS: 1.0000 mg | ORAL_TABLET | Freq: Two times a day (BID) | ORAL | 0 refills | Status: DC | PRN

## 2022-06-07 MED ORDER — LISDEXAMFETAMINE DIMESYLATE 40 MG PO CAPS: 40.0000 mg | ORAL_CAPSULE | Freq: Every day | ORAL | 0 refills | Status: DC

## 2022-06-07 NOTE — Progress Notes (Signed)
[name] [id number] Jun 24, 1975 47 y.o.  Subjective:   Patient ID:  Samantha Romero is a 47 y.o. (DOB 1975-05-16) female.  Chief Complaint: No chief complaint on file.   HPI Samantha Romero presents to the office today for follow-up of MDD, GAD, ADHD, and insomnia.  Describes mood today as "better". Pleasant. Tearful at times. Mood symptoms - reports depression. Reports increased anxiety with situational stressors. Denies irritability. Reports worry, rumination, and over thinking. Reports obsessive thoughts and acts. Continues to experience a lot of shame about her emotional state, but is opening up more about it. Mood remains lower. Stating "I'm trying to move forward". Feels like medications are helpful. Has been unable to work since March 02, 2022 due to emotional instability. Feels like current medications are helpful. Also seeing therapist Buena Irish. Does not feel like she is able to return to the work setting at this time. Varying interest and motivation. Taking medications as prescribed.  Energy levels improving. Active, does not have a regular exercise routine.   Enjoys some usual interests and activities. In a healthy long term relationship. Has family local - mother, brother, and extended family. Spending time with family.  Appetite adequate. Weight loss.  Sleeping better some nights than others. Averages 6 hours. Focus and concentration improved with increase in Vyvanse. Completing tasks. Managing aspects of household. 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 Denies SI or HI.  Denies AH or VH. Denies self harm. Denies substance use.  Completed IOP program at [facility].  Previous medication trials: Topamax    PHQ2-9    [place] Office Visit from 03/24/2022 in [place] from 02/25/2022 in [facility] V[facility] Health Office  Visit from 01/06/2022 in [facility] Nutrition from 04/20/2021 in [place] [facility] [place] from 01/26/2021 in [facility]  PHQ-2 Total Score '3 5 1 '$ 0 0  PHQ-9 Total Score '16 19 10 '$ -- National ED from 04/14/2022 in [facility] at [place] [facility]) ED from 10/08/2020 in [facility] Emergency Department at [place] No Risk No Risk        Review of Systems:  Review of Systems  Musculoskeletal:  Negative for gait problem.  Neurological:  Negative for tremors.  Psychiatric/Behavioral:         Please refer to HPI    Medications: I have reviewed the patient's current medications.  Current Outpatient Medications  Medication Sig Dispense Refill   albuterol (PROVENTIL) (2.5 MG/3ML) 0.083% nebulizer solution Take 3 mLs (2.5 mg total) by nebulization every 6 (six) hours as needed for wheezing or shortness of breath. 150 mL 1   albuterol (VENTOLIN HFA) 108 (90 Base) MCG/ACT inhaler Inhale 2 puffs into the lungs every 6 (six) hours as needed for wheezing or shortness of breath. 8 g 2   ALPRAZolam (XANAX) 1 MG tablet Take 1 tablet (1 mg total) by mouth 2 (two) times daily as needed for anxiety. 20 tablet 0   azelastine (ASTELIN) 0.1 % nasal spray Place 2 sprays into both nostrils 2 (two) times daily. 30 mL 5   buPROPion (WELLBUTRIN XL) 150 MG 24 hr tablet Take 3 tablets (450 mg total) by mouth daily. 270 tablet 2   carvedilol (COREG) 25  MG tablet TAKE 1 TABLET (25 MG TOTAL) BY MOUTH TWICE A DAY WITH MEALS 180 tablet 3   DULoxetine (CYMBALTA) 30 MG capsule Take 1 capsule (30 mg total) by mouth daily. 30 capsule 0   DULoxetine (CYMBALTA) 60 MG capsule TAKE 1 CAPSULE BY MOUTH EVERY DAY 90 capsule 1   empagliflozin (JARDIANCE) 10 MG TABS tablet TAKE ONE TABLET BY MOUTH DAILY BEFORE BREAKFAST 90 tablet 3    Fluticasone-Salmeterol 232-14 MCG/ACT AEPB Inhale 1 puff into the lungs in the morning and at bedtime. 1 each 5   furosemide (LASIX) 80 MG tablet Take 1 tablet (80 mg total) by mouth daily. 90 tablet 3   levocetirizine (XYZAL) 5 MG tablet TAKE 1 TABLET BY MOUTH EVERY DAY IN THE EVENING 90 tablet 0   levonorgestrel (MIRENA, 52 MG,) 20 MCG/DAY IUD      lisdexamfetamine (VYVANSE) 40 MG capsule Take 1 capsule (40 mg total) by mouth daily. 30 capsule 0   meloxicam (MOBIC) 15 MG tablet TAKE 1 TABLET BY MOUTH EVERY DAY AS NEEDED FOR PAIN 30 tablet 0   montelukast (SINGULAIR) 10 MG tablet TAKE 1 TABLET BY MOUTH EVERYDAY AT BEDTIME 90 tablet 2   omeprazole (PRILOSEC) 40 MG capsule TAKE 1 CAPSULE BY MOUTH EVERY DAY (Patient not taking: Reported on 03/24/2022) 90 capsule 1   promethazine-dextromethorphan (PROMETHAZINE-DM) 6.25-15 MG/5ML syrup Take 5 mLs by mouth at bedtime as needed for cough. 118 mL 0   sacubitril-valsartan (ENTRESTO) 97-103 MG Take 1 tablet by mouth 2 (two) times daily. 60 tablet 11   spironolactone (ALDACTONE) 25 MG tablet Take 1 tablet (25 mg total) by mouth daily. 90 tablet 3   traZODone (DESYREL) 50 MG tablet Take 1 tablet (50 mg total) by mouth at bedtime. 30 tablet 1   Vitamin D, Ergocalciferol, (DRISDOL) 1.25 MG (50000 UNIT) CAPS capsule Take 50,000 Units by mouth once a week.     No current facility-administered medications for this visit.    Medication Side Effects: None  Allergies:  Allergies  Allergen Reactions   Grass Extracts [Gramineae Pollens]     Nasal congestion, post nasal drip    Shellfish Allergy Itching    In throat only.    Past Medical History:  Diagnosis Date   Anxiety    panic attacks   Asthma    CHF (congestive heart failure), NYHA class II, acute, systolic ([place]) [id number]   Depression    Fibromyalgia    Hypertension    no meds currently   No pertinent past medical history    Pneumonia    hx of    Sinus problem     Past Medical History,  Surgical history, Social history, and Family history were reviewed and updated as appropriate.   Please see review of systems for further details on the patient's review from today.   Objective:   Physical Exam:  There were no vitals taken for this visit.  Physical Exam Constitutional:      General: She is not in acute distress. Musculoskeletal:        General: No deformity.  Neurological:     Mental Status: She is alert and oriented to person, place, and time.     Coordination: Coordination normal.  Psychiatric:        Attention and Perception: Attention and perception normal. She does not perceive auditory or visual hallucinations.        Mood and Affect: Mood normal. Mood is not anxious or depressed. Affect is not labile,  blunt, angry or inappropriate.        Speech: Speech normal.        Behavior: Behavior normal.        Thought Content: Thought content normal. Thought content is not paranoid or delusional. Thought content does not include homicidal or suicidal ideation. Thought content does not include homicidal or suicidal plan.        Cognition and Memory: Cognition and memory normal.        Judgment: Judgment normal.     Comments: Insight intact     Lab Review:     Component Value Date/Time   NA 145 (H) 04/14/2022 1438   K 3.5 04/14/2022 1438   CL 106 04/14/2022 1438   CO2 24 04/14/2022 1438   GLUCOSE 84 04/14/2022 1438   GLUCOSE 93 10/08/2020 2012   BUN 9 04/14/2022 1438   CREATININE 0.72 04/14/2022 1438   CALCIUM 9.2 04/14/2022 1438   PROT 6.6 04/14/2022 1438   ALBUMIN 4.1 04/14/2022 1438   AST 32 04/14/2022 1438   ALT 62 (H) 04/14/2022 1438   ALKPHOS 135 (H) 04/14/2022 1438   BILITOT 0.8 04/14/2022 1438   GFRNONAA >60 10/08/2020 2012   GFRAA 115 05/08/2020 1654       Component Value Date/Time   WBC 6.7 04/14/2022 1438   WBC 8.2 10/08/2020 2012   RBC 4.61 04/14/2022 1438   RBC 4.89 10/08/2020 2012   HGB 12.8 04/14/2022 1438   HCT 40.3 04/14/2022  1438   PLT 272 04/14/2022 1438   MCV 87 04/14/2022 1438   MCH 27.8 04/14/2022 1438   MCH 27.6 10/08/2020 2012   MCHC 31.8 04/14/2022 1438   MCHC 30.8 10/08/2020 2012   RDW 12.9 04/14/2022 1438   LYMPHSABS 2.3 04/14/2022 1438   MONOABS 0.6 10/08/2020 2012   EOSABS 0.1 04/14/2022 1438   BASOSABS 0.0 04/14/2022 1438    No results found for: "POCLITH", "LITHIUM"   No results found for: "PHENYTOIN", "PHENOBARB", "VALPROATE", "CBMZ"   .res Assessment: Plan:    Plan:  Patient currently out of work and is totally disabled. Out of work 04/14/2022 through 07/14/2022.  Seeing therapist - Debbe Bales  PDMP reviewed  Trazadone '50mg'$  at hs as needed - twice a week. Cymbalta 60/30 daily Wellbutrin XL '150mg'$  - 3 daily Xanax '1mg'$  up to twice a day - usually taking it once to twice a week Vyvanse '40mg'$  every morning  Time spent with patient was 25 minutes. Greater than 50% of face to face time with patient was spent on counseling and coordination of care.    RTC 3/4 weeks  Patient advised to contact office with any questions, adverse effects, or acute worsening in signs and symptoms.   Discussed potential benefits, risk, and side effects of benzodiazepines to include potential risk of tolerance and dependence, as well as possible drowsiness. Advised patient not to drive if experiencing drowsiness and to take lowest possible effective dose to minimize risk of dependence and tolerance.   Discussed potential benefits, risks, and side effects of stimulants with patient to include increased heart rate, palpitations, insomnia, increased anxiety, increased irritability, or decreased appetite.  Instructed patient to contact office if experiencing any significant tolerability issues.   There are no diagnoses linked to this encounter.   Please see After Visit Summary for patient specific instructions.  Future Appointments  Date Time Provider [place]  06/07/2022 10:40 AM Katti Pelle, Berdie Ogren, NP CP-CP None  07/13/2022  2:40 PM [name], FNP Tyson Alias  None    No orders of the defined types were placed in this encounter.   -------------------------------

## 2022-07-05 ENCOUNTER — Ambulatory Visit (INDEPENDENT_AMBULATORY_CARE_PROVIDER_SITE_OTHER): Payer: Self-pay | Admitting: Adult Health

## 2022-07-05 ENCOUNTER — Encounter: Payer: Self-pay | Admitting: Adult Health

## 2022-07-05 DIAGNOSIS — G4700 Insomnia, unspecified: Secondary | ICD-10-CM

## 2022-07-05 DIAGNOSIS — F332 Major depressive disorder, recurrent severe without psychotic features: Secondary | ICD-10-CM

## 2022-07-05 DIAGNOSIS — F909 Attention-deficit hyperactivity disorder, unspecified type: Secondary | ICD-10-CM

## 2022-07-05 DIAGNOSIS — F411 Generalized anxiety disorder: Secondary | ICD-10-CM

## 2022-07-05 MED ORDER — AMPHETAMINE-DEXTROAMPHET ER 20 MG PO CP24: 20.0000 mg | ORAL_CAPSULE | Freq: Every day | ORAL | 0 refills | Status: DC

## 2022-07-05 NOTE — Addendum Note (Signed)
Addended by: Dorothyann Gibbs on: 07/05/2022 01:45 PM   Modules accepted: Orders

## 2022-07-05 NOTE — Progress Notes (Signed)
Samantha Romero [id number] 1975/03/30 47 y.o.  Subjective:   Patient ID:  Samantha Romero is a 47 y.o. (DOB 1976-01-25) female.  Chief Complaint: No chief complaint on file.   HPI Samantha Romero presents to the office today for follow-up of MDD, GAD, ADHD, and insomnia.  Describes mood today as "up and down". Pleasant. Tearful at times. Mood symptoms - reports depression - not any better. Reports increased anxiety with situational stressors. Reporting increased social anxiety - working on that. Denies irritability. Reports worry, rumination, and over thinking. Reports obsessive thoughts and acts. Mood remains lower. Had to move in with mother after losing her home. Stating "I'm still struggling emotionally". Support system off balance - has people to talk to, but feels like she may have over extended with them. Feels like medications are helpful, but unable to get the Vyvanse. Willing to consider other options. Plans to see therapist Delight Ovens. Has been unable to work since March 02, 2022 due to emotional instability. Stating "I want to return, but I'm not ready". Varying interest and motivation. Taking medications as prescribed.  Energy levels "very low". Active, does not have a regular exercise routine.   Enjoys some usual interests and activities. Involved in a long term relationship. Has family local - mother, brother, and extended family. Spending time with family.  Appetite adequate. Weight loss - 10 pounds.  Sleeping better some nights than others - trouble getting to and staying asleep. Averages 5 to 6 hours. Focus and concentration "not so great" - has been out of Vyvanse for the past 2 and 1/2 weeks - cannot afford it. Completing tasks. Managing aspects of household. Works as a Set designer - out of work currently. Denies SI or HI.  Denies AH or VH. Denies self harm. Denies substance use.  Completed IOP program at [facility].  Previous medication trials:  Topamax   PHQ2-9    Flowsheet Row Office Visit from 03/24/2022 in [facility] INTENSIVE CARDIAC REHAB ORIENT from 02/25/2022 in [facility] for Heart, Vascular, & Lung Health Office Visit from 01/06/2022 in [facility] Nutrition from 04/20/2021 in [facility] at [facility] Visit from 01/26/2021 in [facility]  PHQ-2 Total Score 3 5 1  0 0  PHQ-9 Total Score 16 19 10  -- 14      Flowsheet Row ED from 04/14/2022 in [facility] at [facility]) ED from 10/08/2020 in [facility] Emergency Department at [facility]  C-SSRS RISK CATEGORY No Risk No Risk        Review of Systems:  Review of Systems  Musculoskeletal:  Negative for gait problem.  Neurological:  Negative for tremors.  Psychiatric/Behavioral:         Please refer to HPI    Medications: I have reviewed the patient's current medications.  Current Outpatient Medications  Medication Sig Dispense Refill   albuterol (PROVENTIL) (2.5 MG/3ML) 0.083% nebulizer solution Take 3 mLs (2.5 mg total) by nebulization every 6 (six) hours as needed for wheezing or shortness of breath. 150 mL 1   albuterol (VENTOLIN HFA) 108 (90 Base) MCG/ACT inhaler Inhale 2 puffs into the lungs every 6 (six) hours as needed for wheezing or shortness of breath. 8 g 2   ALPRAZolam (XANAX) 1 MG tablet Take 1 tablet (1 mg total) by mouth 2 (two) times daily as needed for anxiety.  20 tablet 0   azelastine (ASTELIN) 0.1 % nasal spray Place 2 sprays into both nostrils 2 (two) times daily. 30 mL 5   buPROPion (WELLBUTRIN XL) 150 MG 24 hr tablet Take 3 tablets (450 mg total) by mouth daily. 270 tablet 2   carvedilol (COREG) 25 MG tablet TAKE 1 TABLET (25 MG TOTAL) BY MOUTH TWICE A DAY WITH MEALS 180 tablet 3   DULoxetine (CYMBALTA) 30 MG capsule Take 1 capsule (30  mg total) by mouth daily. 30 capsule 0   DULoxetine (CYMBALTA) 60 MG capsule TAKE 1 CAPSULE BY MOUTH EVERY DAY 90 capsule 1   empagliflozin (JARDIANCE) 10 MG TABS tablet TAKE ONE TABLET BY MOUTH DAILY BEFORE BREAKFAST 90 tablet 3   Fluticasone-Salmeterol 232-14 MCG/ACT AEPB Inhale 1 puff into the lungs in the morning and at bedtime. 1 each 5   furosemide (LASIX) 80 MG tablet Take 1 tablet (80 mg total) by mouth daily. 90 tablet 3   levocetirizine (XYZAL) 5 MG tablet TAKE 1 TABLET BY MOUTH EVERY DAY IN THE EVENING 90 tablet 0   levonorgestrel (MIRENA, 52 MG,) 20 MCG/DAY IUD      lisdexamfetamine (VYVANSE) 40 MG capsule Take 1 capsule (40 mg total) by mouth daily. 30 capsule 0   meloxicam (MOBIC) 15 MG tablet TAKE 1 TABLET BY MOUTH EVERY DAY AS NEEDED FOR PAIN 30 tablet 0   montelukast (SINGULAIR) 10 MG tablet TAKE 1 TABLET BY MOUTH EVERYDAY AT BEDTIME 90 tablet 2   omeprazole (PRILOSEC) 40 MG capsule TAKE 1 CAPSULE BY MOUTH EVERY DAY (Patient not taking: Reported on 03/24/2022) 90 capsule 1   promethazine-dextromethorphan (PROMETHAZINE-DM) 6.25-15 MG/5ML syrup Take 5 mLs by mouth at bedtime as needed for cough. 118 mL 0   sacubitril-valsartan (ENTRESTO) 97-103 MG Take 1 tablet by mouth 2 (two) times daily. 60 tablet 11   spironolactone (ALDACTONE) 25 MG tablet Take 1 tablet (25 mg total) by mouth daily. 90 tablet 3   traZODone (DESYREL) 50 MG tablet Take 1 tablet (50 mg total) by mouth at bedtime. 30 tablet 1   Vitamin D, Ergocalciferol, (DRISDOL) 1.25 MG (50000 UNIT) CAPS capsule Take 50,000 Units by mouth once a week.     No current facility-administered medications for this visit.    Medication Side Effects: None  Allergies:  Allergies  Allergen Reactions   Grass Extracts [Gramineae Pollens]     Nasal congestion, post nasal drip    Shellfish Allergy Itching    In throat only.    Past Medical History:  Diagnosis Date   Anxiety    panic attacks   Asthma    CHF (congestive heart  failure), NYHA class II, acute, systolic ([facility]) 10/06/2018   Depression    Fibromyalgia    Hypertension    no meds currently   No pertinent past medical history    Pneumonia    hx of    Sinus problem     Past Medical History, Surgical history, Social history, and Family history were reviewed and updated as appropriate.   Please see review of systems for further details on the patient's review from today.   Objective:   Physical Exam:  There were no vitals taken for this visit.  Physical Exam Constitutional:      General: She is not in acute distress. Musculoskeletal:        General: No deformity.  Neurological:     Mental Status: She is alert and oriented to person, place, and time.  Coordination: Coordination normal.  Psychiatric:        Attention and Perception: Attention and perception normal. She does not perceive auditory or visual hallucinations.        Mood and Affect: Mood normal. Mood is not anxious or depressed. Affect is not labile, blunt, angry or inappropriate.        Speech: Speech normal.        Behavior: Behavior normal.        Thought Content: Thought content normal. Thought content is not paranoid or delusional. Thought content does not include homicidal or suicidal ideation. Thought content does not include homicidal or suicidal plan.        Cognition and Memory: Cognition and memory normal.        Judgment: Judgment normal.     Comments: Insight intact     Lab Review:     Component Value Date/Time   NA 145 (H) 04/14/2022 1438   K 3.5 04/14/2022 1438   CL 106 04/14/2022 1438   CO2 24 04/14/2022 1438   GLUCOSE 84 04/14/2022 1438   GLUCOSE 93 10/08/2020 2012   BUN 9 04/14/2022 1438   CREATININE 0.72 04/14/2022 1438   CALCIUM 9.2 04/14/2022 1438   PROT 6.6 04/14/2022 1438   ALBUMIN 4.1 04/14/2022 1438   AST 32 04/14/2022 1438   ALT 62 (H) 04/14/2022 1438   ALKPHOS 135 (H) 04/14/2022 1438   BILITOT 0.8 04/14/2022 1438   GFRNONAA >60 10/08/2020  2012   GFRAA 115 05/08/2020 1654       Component Value Date/Time   WBC 6.7 04/14/2022 1438   WBC 8.2 10/08/2020 2012   RBC 4.61 04/14/2022 1438   RBC 4.89 10/08/2020 2012   HGB 12.8 04/14/2022 1438   HCT 40.3 04/14/2022 1438   PLT 272 04/14/2022 1438   MCV 87 04/14/2022 1438   MCH 27.8 04/14/2022 1438   MCH 27.6 10/08/2020 2012   MCHC 31.8 04/14/2022 1438   MCHC 30.8 10/08/2020 2012   RDW 12.9 04/14/2022 1438   LYMPHSABS 2.3 04/14/2022 1438   MONOABS 0.6 10/08/2020 2012   EOSABS 0.1 04/14/2022 1438   BASOSABS 0.0 04/14/2022 1438    No results found for: "POCLITH", "LITHIUM"   No results found for: "PHENYTOIN", "PHENOBARB", "VALPROATE", "CBMZ"   .res Assessment: Plan:    Plan:  Patient currently out of work and is totally disabled. Out of work 07/05/2022 through 08/04/2022.  Seeing therapist - Lennice Sites  PDMP reviewed  Trazadone 50mg  at hs as needed - twice a week. Cymbalta 60/30 daily Wellbutrin XL 150mg  - 3 daily Xanax 1mg  up to twice a day - usually taking it once to twice a week  D/C Vyvanse 40mg  every morning - cannot afford Add Adderall XR 20mg  daily  Time spent with patient was 25 minutes. Greater than 50% of face to face time with patient was spent on counseling and coordination of care.    RTC 3/4 weeks  Patient advised to contact office with any questions, adverse effects, or acute worsening in signs and symptoms.   Discussed potential benefits, risk, and side effects of benzodiazepines to include potential risk of tolerance and dependence, as well as possible drowsiness. Advised patient not to drive if experiencing drowsiness and to take lowest possible effective dose to minimize risk of dependence and tolerance.   Discussed potential benefits, risks, and side effects of stimulants with patient to include increased heart rate, palpitations, insomnia, increased anxiety, increased irritability, or decreased appetite.  Instructed patient  to contact  office if experiencing any significant tolerability issues.   There are no diagnoses linked to this encounter.   Please see After Visit Summary for patient specific instructions.  Future Appointments  Date Time Provider Department Center  07/05/2022  1:20 PM Denilson Salminen, Thereasa Soloegina Nattalie, NP CP-CP None  07/13/2022  2:40 PM Arnette FeltsMoore, Janece, FNP [facility] None    No orders of the defined types were placed in this encounter.   -------------------------------

## 2022-07-06 ENCOUNTER — Encounter: Payer: Self-pay | Admitting: Cardiology

## 2022-07-07 MED ORDER — FUROSEMIDE 40 MG PO TABS: 40.0000 mg | ORAL_TABLET | Freq: Every day | ORAL | 2 refills | Status: DC

## 2022-07-07 NOTE — Telephone Encounter (Signed)
Samantha Sprague, MD  P Cv Div Pharmd Cc: Loa Socks, LPN What do you think about adderrall in this patient? A little hesitant due to the history of reduced EF in the past but it has recovered.  Samantha Romero, I am totally fine sending in the 40mg  tablets and we can say take daily with additional dose as needed for LE edema/weight gain.   Lasix 40 mg po daily with an extra dose to take prn for swelling or weight gain was sent to the pts pharmacy of choice.  Pt made aware of this via mychart message.

## 2022-07-12 ENCOUNTER — Ambulatory Visit: Payer: Self-pay | Admitting: Nurse Practitioner

## 2022-07-12 DIAGNOSIS — F418 Other specified anxiety disorders: Secondary | ICD-10-CM | POA: Insufficient documentation

## 2022-07-13 ENCOUNTER — Encounter: Payer: BC Managed Care – PPO | Admitting: Nurse Practitioner

## 2022-07-19 ENCOUNTER — Other Ambulatory Visit: Payer: Self-pay

## 2022-07-19 DIAGNOSIS — F411 Generalized anxiety disorder: Secondary | ICD-10-CM

## 2022-07-19 MED ORDER — ALPRAZOLAM 1 MG PO TABS: 1.0000 mg | ORAL_TABLET | Freq: Two times a day (BID) | ORAL | 0 refills | Status: DC | PRN

## 2022-08-02 ENCOUNTER — Ambulatory Visit: Payer: Self-pay | Admitting: Adult Health

## 2022-08-03 ENCOUNTER — Other Ambulatory Visit (HOSPITAL_COMMUNITY): Payer: Self-pay | Admitting: Surgery

## 2022-08-03 DIAGNOSIS — Z9884 Bariatric surgery status: Secondary | ICD-10-CM

## 2022-08-03 DIAGNOSIS — K219 Gastro-esophageal reflux disease without esophagitis: Secondary | ICD-10-CM

## 2022-08-12 ENCOUNTER — Ambulatory Visit (INDEPENDENT_AMBULATORY_CARE_PROVIDER_SITE_OTHER): Payer: Self-pay | Admitting: Adult Health

## 2022-08-12 ENCOUNTER — Encounter: Payer: Self-pay | Admitting: Adult Health

## 2022-08-12 DIAGNOSIS — F411 Generalized anxiety disorder: Secondary | ICD-10-CM

## 2022-08-12 DIAGNOSIS — F332 Major depressive disorder, recurrent severe without psychotic features: Secondary | ICD-10-CM

## 2022-08-12 DIAGNOSIS — G4700 Insomnia, unspecified: Secondary | ICD-10-CM

## 2022-08-12 DIAGNOSIS — F909 Attention-deficit hyperactivity disorder, unspecified type: Secondary | ICD-10-CM

## 2022-08-12 NOTE — Progress Notes (Signed)
Samantha Romero [id number] May 23, 1975 47 y.o.  Virtual Visit via Telephone Note  I connected with pt on 08/12/22 at  8:20 AM EDT by telephone and verified that I am speaking with the correct person using two identifiers.   I discussed the limitations, risks, security and privacy concerns of performing an evaluation and management service by telephone and the availability of in person appointments. I also discussed with the patient that there may be a patient responsible charge related to this service. The patient expressed understanding and agreed to proceed.   I discussed the assessment and treatment plan with the patient. The patient was provided an opportunity to ask questions and all were answered. The patient agreed with the plan and demonstrated an understanding of the instructions.   The patient was advised to call back or seek an in-person evaluation if the symptoms worsen or if the condition fails to improve as anticipated.  I provided 25 minutes of non-face-to-face time during this encounter.  The patient was located at home.  The provider was located at [facility].   Dorothyann Gibbs, NP   Subjective:   Patient ID:  Samantha Romero is a 47 y.o. (DOB June 27, 1975) female.  Chief Complaint: No chief complaint on file.   HPI Samantha Romero presents to the office today for follow-up of MDD, GAD, ADHD, and insomnia.  Describes mood today as "down". Pleasant. Tearful at times. Mood symptoms - reports increased depression and anxiety. Reporting increased social anxiety. Denies irritability - "just with myself". Reports worry, rumination, and over thinking. Reports obsessive thoughts and acts. Reports mood as low. Recently lost her home, reports financial struggles, living with her mother. Stating "I'm really struggling". Reports taking medications "sporadically". Willing to consider IOP program again to get more support - consistency. Decreased interest and motivation. Energy  levels lower. Active, does not have a regular exercise routine.   Enjoys some usual interests and activities. Living with mother. Involved in a long term relationship. Has family local - mother, brother, and extended family. Spending time with family.  Appetite adequate. Weight loss.  Reports sleeping difficulties. Averages 4 hours. Focus and concentration "not good" - has not been taking Vyvanse. Completing tasks. Managing aspects of household. Works as a Set designer - out of work currently. Denies SI or HI.  Denies AH or VH. Denies self harm. Denies substance use.  Completed IOP program at [facility].  Previous medication trials: Topamax   PHQ2-9    Flowsheet Row Office Visit from 03/24/2022 in [facility] INTENSIVE CARDIAC REHAB ORIENT from 02/25/2022 in [facility] Office Visit from 01/06/2022 in [facility] Nutrition from 04/20/2021 in [facility] Nutrition & Diabetes Education Services at [facility] Visit from 01/26/2021 in [facility]  PHQ-2 Total Score 3 5 1  0 0  PHQ-9 Total Score 16 19 10  -- 14      Flowsheet Row ED from 04/14/2022 in [facility] at [facility]) ED from 10/08/2020 in [facility] Emergency Department at [facility]  C-SSRS RISK CATEGORY No Risk No Risk        Review of Systems:  Review of Systems  Musculoskeletal:  Negative for gait problem.  Neurological:  Negative for tremors.  Psychiatric/Behavioral:         Please refer to HPI    Medications: I have reviewed the  patient's current medications.  Current Outpatient Medications  Medication Sig Dispense Refill   albuterol (PROVENTIL) (2.5 MG/3ML) 0.083% nebulizer solution Take 3 mLs (2.5 mg total) by nebulization every 6 (six) hours as needed for wheezing or shortness of breath.  150 mL 1   albuterol (VENTOLIN HFA) 108 (90 Base) MCG/ACT inhaler Inhale 2 puffs into the lungs every 6 (six) hours as needed for wheezing or shortness of breath. 8 g 2   ALPRAZolam (XANAX) 1 MG tablet Take 1 tablet (1 mg total) by mouth 2 (two) times daily as needed for anxiety. 20 tablet 0   amphetamine-dextroamphetamine (ADDERALL XR) 20 MG 24 hr capsule Take 1 capsule (20 mg total) by mouth daily. 30 capsule 0   azelastine (ASTELIN) 0.1 % nasal spray Place 2 sprays into both nostrils 2 (two) times daily. 30 mL 5   buPROPion (WELLBUTRIN XL) 150 MG 24 hr tablet Take 3 tablets (450 mg total) by mouth daily. 270 tablet 2   carvedilol (COREG) 25 MG tablet TAKE 1 TABLET (25 MG TOTAL) BY MOUTH TWICE A DAY WITH MEALS 180 tablet 3   DULoxetine (CYMBALTA) 30 MG capsule Take 1 capsule (30 mg total) by mouth daily. 30 capsule 0   DULoxetine (CYMBALTA) 60 MG capsule TAKE 1 CAPSULE BY MOUTH EVERY DAY 90 capsule 1   empagliflozin (JARDIANCE) 10 MG TABS tablet TAKE ONE TABLET BY MOUTH DAILY BEFORE BREAKFAST 90 tablet 3   Fluticasone-Salmeterol 232-14 MCG/ACT AEPB Inhale 1 puff into the lungs in the morning and at bedtime. 1 each 5   furosemide (LASIX) 40 MG tablet Take 1 tablet (40 mg total) by mouth daily. You may take 1 extra tablet (40 mg total) by mouth daily as needed for lower extremity swelling/weight gain. 105 tablet 2   levocetirizine (XYZAL) 5 MG tablet TAKE 1 TABLET BY MOUTH EVERY DAY IN THE EVENING 90 tablet 0   levonorgestrel (MIRENA, 52 MG,) 20 MCG/DAY IUD      meloxicam (MOBIC) 15 MG tablet TAKE 1 TABLET BY MOUTH EVERY DAY AS NEEDED FOR PAIN 30 tablet 0   montelukast (SINGULAIR) 10 MG tablet TAKE 1 TABLET BY MOUTH EVERYDAY AT BEDTIME 90 tablet 2   omeprazole (PRILOSEC) 40 MG capsule TAKE 1 CAPSULE BY MOUTH EVERY DAY (Patient not taking: Reported on 03/24/2022) 90 capsule 1   promethazine-dextromethorphan (PROMETHAZINE-DM) 6.25-15 MG/5ML syrup Take 5 mLs by mouth at bedtime as needed for cough. 118  mL 0   sacubitril-valsartan (ENTRESTO) 97-103 MG Take 1 tablet by mouth 2 (two) times daily. 60 tablet 11   spironolactone (ALDACTONE) 25 MG tablet Take 1 tablet (25 mg total) by mouth daily. 90 tablet 3   traZODone (DESYREL) 50 MG tablet Take 1 tablet (50 mg total) by mouth at bedtime. 30 tablet 1   Vitamin D, Ergocalciferol, (DRISDOL) 1.25 MG (50000 UNIT) CAPS capsule Take 50,000 Units by mouth once a week.     No current facility-administered medications for this visit.    Medication Side Effects: None  Allergies:  Allergies  Allergen Reactions   Grass Extracts [Gramineae Pollens]     Nasal congestion, post nasal drip    Shellfish Allergy Itching    In throat only.    Past Medical History:  Diagnosis Date   Anxiety    panic attacks   Asthma    CHF (congestive heart failure), NYHA class II, acute, systolic (HCC) 10/06/2018   Depression    Fibromyalgia    Hypertension    no meds currently  No pertinent past medical history    Pneumonia    hx of    Sinus problem     Past Medical History, Surgical history, Social history, and Family history were reviewed and updated as appropriate.   Please see review of systems for further details on the patient's review from today.   Objective:   Physical Exam:  There were no vitals taken for this visit.  Physical Exam Constitutional:      General: She is not in acute distress. Musculoskeletal:        General: No deformity.  Neurological:     Mental Status: She is alert and oriented to person, place, and time.     Coordination: Coordination normal.  Psychiatric:        Attention and Perception: Attention and perception normal. She does not perceive auditory or visual hallucinations.        Mood and Affect: Mood normal. Mood is not anxious or depressed. Affect is not labile, blunt, angry or inappropriate.        Speech: Speech normal.        Behavior: Behavior normal.        Thought Content: Thought content normal. Thought  content is not paranoid or delusional. Thought content does not include homicidal or suicidal ideation. Thought content does not include homicidal or suicidal plan.        Cognition and Memory: Cognition and memory normal.        Judgment: Judgment normal.     Comments: Insight intact     Lab Review:     Component Value Date/Time   NA 145 (H) 04/14/2022 1438   K 3.5 04/14/2022 1438   CL 106 04/14/2022 1438   CO2 24 04/14/2022 1438   GLUCOSE 84 04/14/2022 1438   GLUCOSE 93 10/08/2020 2012   BUN 9 04/14/2022 1438   CREATININE 0.72 04/14/2022 1438   CALCIUM 9.2 04/14/2022 1438   PROT 6.6 04/14/2022 1438   ALBUMIN 4.1 04/14/2022 1438   AST 32 04/14/2022 1438   ALT 62 (H) 04/14/2022 1438   ALKPHOS 135 (H) 04/14/2022 1438   BILITOT 0.8 04/14/2022 1438   GFRNONAA >60 10/08/2020 2012   GFRAA 115 05/08/2020 1654       Component Value Date/Time   WBC 6.7 04/14/2022 1438   WBC 8.2 10/08/2020 2012   RBC 4.61 04/14/2022 1438   RBC 4.89 10/08/2020 2012   HGB 12.8 04/14/2022 1438   HCT 40.3 04/14/2022 1438   PLT 272 04/14/2022 1438   MCV 87 04/14/2022 1438   MCH 27.8 04/14/2022 1438   MCH 27.6 10/08/2020 2012   MCHC 31.8 04/14/2022 1438   MCHC 30.8 10/08/2020 2012   RDW 12.9 04/14/2022 1438   LYMPHSABS 2.3 04/14/2022 1438   MONOABS 0.6 10/08/2020 2012   EOSABS 0.1 04/14/2022 1438   BASOSABS 0.0 04/14/2022 1438    No results found for: "POCLITH", "LITHIUM"   No results found for: "PHENYTOIN", "PHENOBARB", "VALPROATE", "CBMZ"   .res Assessment: Plan:    Plan:  Patient currently out of work and is totally disabled. Out of work 08/04/2022 through 11/04/2022.  Seeing therapist - Lennice Sites  PDMP reviewed  Will reach out to [facility] for IOP.  Has not been taking medications consistently.  Trazadone 50mg  at hs as needed - twice a week. Cymbalta 60/30 daily Wellbutrin XL 150mg  - 3 daily Xanax 1mg  up to twice a day - usually taking it once to twice a week  Add  Adderall  XR 20mg  daily  Time spent with patient was 25 minutes. Greater than 50% of face to face time with patient was spent on counseling and coordination of care.    RTC 4 weeks  Patient advised to contact office with any questions, adverse effects, or acute worsening in signs and symptoms.   Discussed potential benefits, risk, and side effects of benzodiazepines to include potential risk of tolerance and dependence, as well as possible drowsiness. Advised patient not to drive if experiencing drowsiness and to take lowest possible effective dose to minimize risk of dependence and tolerance.   Discussed potential benefits, risks, and side effects of stimulants with patient to include increased heart rate, palpitations, insomnia, increased anxiety, increased irritability, or decreased appetite.  Instructed patient to contact office if experiencing any significant tolerability issues.   Diagnoses and all orders for this visit:  Major depressive disorder, recurrent episode, severe with anxious distress (HCC)  GAD (generalized anxiety disorder)  Attention deficit hyperactivity disorder (ADHD), unspecified ADHD type  Insomnia, unspecified type     Please see After Visit Summary for patient specific instructions.  Future Appointments  Date Time Provider Department Center  08/24/2022 11:00 AM [facility] R/F 1 [facility] [place]    No orders of the defined types were placed in this encounter.   -------------------------------

## 2022-08-17 ENCOUNTER — Telehealth: Payer: Self-pay | Admitting: Adult Health

## 2022-08-17 NOTE — Telephone Encounter (Signed)
Can we call and get an update on her. Was not taking medications consistently last visit.

## 2022-08-17 NOTE — Telephone Encounter (Signed)
   07/19/2022 07/19/2022 1  Alprazolam 1 Mg Tablet 20.00 10 Re Moz [id number] (9290) 0/0 4.00 LME Private Pay [place]  07/07/2022 07/05/2022 1  Dextroamp-Amphet Er 20 Mg Cap 30.00

## 2022-08-17 NOTE — Telephone Encounter (Signed)
Timberly lvm requesting refills on the Xanax and Adderall.  Fill at [id number] - [place], [place] - [address] AT [address]OSS [address], [place] [address] Phone: [phone]  Fax: [phone]

## 2022-08-17 NOTE — Telephone Encounter (Signed)
LVM to RC 

## 2022-08-18 ENCOUNTER — Other Ambulatory Visit: Payer: Self-pay

## 2022-08-18 DIAGNOSIS — F909 Attention-deficit hyperactivity disorder, unspecified type: Secondary | ICD-10-CM

## 2022-08-18 DIAGNOSIS — F411 Generalized anxiety disorder: Secondary | ICD-10-CM

## 2022-08-18 MED ORDER — ALPRAZOLAM 1 MG PO TABS: 1.0000 mg | ORAL_TABLET | Freq: Two times a day (BID) | ORAL | 1 refills | Status: DC | PRN

## 2022-08-18 MED ORDER — AMPHETAMINE-DEXTROAMPHET ER 20 MG PO CP24: 20.0000 mg | ORAL_CAPSULE | Freq: Every day | ORAL | 0 refills | Status: DC

## 2022-08-18 NOTE — Telephone Encounter (Signed)
Patient said she was having GI upset with bupropion.  She was not taking with food and is now and GI sx have resolved. She is taking alprazolam as needed and is more consistent with Adderall now. Will pend alprazolam and Adderall.

## 2022-08-18 NOTE — Telephone Encounter (Signed)
Noted. Ty!

## 2022-08-24 ENCOUNTER — Ambulatory Visit (HOSPITAL_COMMUNITY): Admission: RE | Admit: 2022-08-24 | Payer: Medicaid Other | Source: Ambulatory Visit

## 2022-08-24 ENCOUNTER — Encounter (HOSPITAL_COMMUNITY): Payer: Self-pay

## 2022-08-31 ENCOUNTER — Telehealth (HOSPITAL_COMMUNITY): Payer: Self-pay | Admitting: Professional

## 2022-09-03 ENCOUNTER — Telehealth (HOSPITAL_COMMUNITY): Payer: Self-pay | Admitting: Professional

## 2022-09-06 ENCOUNTER — Telehealth: Payer: Self-pay | Admitting: Adult Health

## 2022-09-06 NOTE — Telephone Encounter (Signed)
Pt lvm that she needs a letter for the social services dept.She is going to IOP and she needs a letter excusing her from work so she can do this program. Please give her a call at [phone]

## 2022-09-06 NOTE — Telephone Encounter (Signed)
Called patient and asked her to have her HR dept fax [place] the required paperwork. Fax # provided.

## 2022-09-08 ENCOUNTER — Telehealth (HOSPITAL_COMMUNITY): Payer: Self-pay | Admitting: Licensed Clinical Social Worker

## 2022-09-22 ENCOUNTER — Other Ambulatory Visit (HOSPITAL_COMMUNITY): Payer: Self-pay

## 2022-09-28 ENCOUNTER — Telehealth: Payer: Self-pay

## 2022-09-28 ENCOUNTER — Other Ambulatory Visit (HOSPITAL_COMMUNITY): Payer: Self-pay

## 2022-09-28 NOTE — Telephone Encounter (Signed)
Pharmacy Patient Advocate Encounter   Received notification from [employer] that prior authorization for JARDIANCE is needed.    PA submitted on 09/28/22 Key [id number] Status is pending  Haze Rushing, CPhT Pharmacy Patient Advocate Specialist Direct Number: [phone] Fax: [phone]

## 2022-09-29 NOTE — Telephone Encounter (Signed)
Pharmacy Patient Advocate Encounter  Prior Authorization for [facility] has been approved.    Effective dates: 09/29/22 through 09/29/23  Haze Rushing, CPhT Pharmacy Patient Advocate Specialist Direct Number: [phone] Fax: [phone]

## 2022-10-08 ENCOUNTER — Ambulatory Visit (INDEPENDENT_AMBULATORY_CARE_PROVIDER_SITE_OTHER): Payer: Medicaid Other | Admitting: Adult Health

## 2022-10-08 ENCOUNTER — Encounter: Payer: Self-pay | Admitting: Adult Health

## 2022-10-08 DIAGNOSIS — F909 Attention-deficit hyperactivity disorder, unspecified type: Secondary | ICD-10-CM

## 2022-10-08 DIAGNOSIS — G4709 Other insomnia: Secondary | ICD-10-CM | POA: Diagnosis not present

## 2022-10-08 DIAGNOSIS — F418 Other specified anxiety disorders: Secondary | ICD-10-CM

## 2022-10-08 MED ORDER — DULOXETINE HCL 60 MG PO CPEP: 60.0000 mg | ORAL_CAPSULE | Freq: Every day | ORAL | 5 refills | Status: DC

## 2022-10-08 MED ORDER — LISDEXAMFETAMINE DIMESYLATE 30 MG PO CAPS: 30.0000 mg | ORAL_CAPSULE | Freq: Every day | ORAL | 0 refills | Status: DC

## 2022-10-08 NOTE — Progress Notes (Signed)
Samantha Romero [id number] 01/20/1976 47 y.o.  Subjective:   Patient ID:  Samantha Romero is a 47 y.o. (DOB 01/14/1976) female.  Chief Complaint: No chief complaint on file.   HPI Samantha Romero presents to the office today for follow-up of MDD, GAD, ADHD, and insomnia.  Describes mood today as "down". Pleasant. Tearful at times. Mood symptoms - reports depression and anxiety. Reports social anxiety. Reports irritability, but not lashing out. Reports worry, rumination, and over thinking. Denies obsessive thoughts and acts. Reports mood as lower. Stating "I'm really struggling, but I'm trying".  Reports decreased interest and motivation. Reports taking medications consistently.  Energy levels lower. Active, does not have a regular exercise routine.   Enjoys some usual interests and activities. Living with mother. Involved in a long term relationship. Has family local - mother, brother, and extended family. Spending time with family.  Appetite adequate. Weight loss.  Reports sleeping better at night. Averages 6 to 7 hours. Focus and concentration difficulties - still struggling.  Would like to switch back to Vyvanse from Adderall. Completing tasks. Managing aspects of household. 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   Denies SI or HI.  Denies AH or VH. Denies self harm. Denies substance use.  Previous medication trials: Topamax   PHQ2-9    Flowsheet Row Office Visit from 03/24/2022 in [facility] INTENSIVE CARDIAC REHAB ORIENT from 02/25/2022 in [facility] Office Visit from 01/06/2022 in [facility] Nutrition from 04/20/2021 in [facility] Nutrition & Diabetes Education Services at [facility] Visit from 01/26/2021 in [facility]  PHQ-2 Total Score 3 5 1  0 0  PHQ-9 Total Score 16 19 10  -- 14      Flowsheet Row ED  from 04/14/2022 in [facility]) ED from 10/08/2020 in [facility] Emergency Department at [facility]  C-SSRS RISK CATEGORY No Risk No Risk        Review of Systems:  Review of Systems  Musculoskeletal:  Negative for gait problem.  Neurological:  Negative for tremors.  Psychiatric/Behavioral:         Please refer to HPI    Medications: I have reviewed the patient's current medications.  Current Outpatient Medications  Medication Sig Dispense Refill   albuterol (PROVENTIL) (2.5 MG/3ML) 0.083% nebulizer solution Take 3 mLs (2.5 mg total) by nebulization every 6 (six) hours as needed for wheezing or shortness of breath. 150 mL 1   albuterol (VENTOLIN HFA) 108 (90 Base) MCG/ACT inhaler Inhale 2 puffs into the lungs every 6 (six) hours as needed for wheezing or shortness of breath. 8 g 2   ALPRAZolam (XANAX) 1 MG tablet Take 1 tablet (1 mg total) by mouth 2 (two) times daily as needed for anxiety. 20 tablet 1   amphetamine-dextroamphetamine (ADDERALL XR) 20 MG 24 hr capsule Take 1 capsule (20 mg total) by mouth daily. 30 capsule 0   azelastine (ASTELIN) 0.1 % nasal spray Place 2 sprays into both nostrils 2 (two) times daily. 30 mL 5   buPROPion (WELLBUTRIN XL) 150 MG 24 hr tablet Take 3 tablets (450 mg total) by mouth daily. 270 tablet 2   carvedilol (COREG) 25 MG tablet TAKE 1 TABLET (25 MG TOTAL) BY MOUTH TWICE A DAY WITH MEALS 180 tablet 3   DULoxetine (CYMBALTA) 30 MG capsule Take 1  capsule (30 mg total) by mouth daily. 30 capsule 0   DULoxetine (CYMBALTA) 60 MG capsule TAKE 1 CAPSULE BY MOUTH EVERY DAY 90 capsule 1   empagliflozin (JARDIANCE) 10 MG TABS tablet TAKE ONE TABLET BY MOUTH DAILY BEFORE BREAKFAST 90 tablet 3   Fluticasone-Salmeterol 232-14 MCG/ACT AEPB Inhale 1 puff into the lungs in the morning and at bedtime. 1 each 5   furosemide (LASIX) 40 MG tablet Take 1 tablet (40 mg total) by mouth daily. You may take 1 extra tablet (40  mg total) by mouth daily as needed for lower extremity swelling/weight gain. 105 tablet 2   levocetirizine (XYZAL) 5 MG tablet TAKE 1 TABLET BY MOUTH EVERY DAY IN THE EVENING 90 tablet 0   levonorgestrel (MIRENA, 52 MG,) 20 MCG/DAY IUD      meloxicam (MOBIC) 15 MG tablet TAKE 1 TABLET BY MOUTH EVERY DAY AS NEEDED FOR PAIN 30 tablet 0   montelukast (SINGULAIR) 10 MG tablet TAKE 1 TABLET BY MOUTH EVERYDAY AT BEDTIME 90 tablet 2   omeprazole (PRILOSEC) 40 MG capsule TAKE 1 CAPSULE BY MOUTH EVERY DAY (Patient not taking: Reported on 03/24/2022) 90 capsule 1   promethazine-dextromethorphan (PROMETHAZINE-DM) 6.25-15 MG/5ML syrup Take 5 mLs by mouth at bedtime as needed for cough. 118 mL 0   sacubitril-valsartan (ENTRESTO) 97-103 MG Take 1 tablet by mouth 2 (two) times daily. 60 tablet 11   spironolactone (ALDACTONE) 25 MG tablet Take 1 tablet (25 mg total) by mouth daily. 90 tablet 3   traZODone (DESYREL) 50 MG tablet Take 1 tablet (50 mg total) by mouth at bedtime. 30 tablet 1   Vitamin D, Ergocalciferol, (DRISDOL) 1.25 MG (50000 UNIT) CAPS capsule Take 50,000 Units by mouth once a week.     No current facility-administered medications for this visit.    Medication Side Effects: None  Allergies:  Allergies  Allergen Reactions   Grass Extracts [Gramineae Pollens]     Nasal congestion, post nasal drip    Shellfish Allergy Itching    In throat only.    Past Medical History:  Diagnosis Date   Anxiety    panic attacks   Asthma    CHF (congestive heart failure), NYHA class II, acute, systolic (HCC) 10/06/2018   Depression    Fibromyalgia    Hypertension    no meds currently   No pertinent past medical history    Pneumonia    hx of    Sinus problem     Past Medical History, Surgical history, Social history, and Family history were reviewed and updated as appropriate.   Please see review of systems for further details on the patient's review from today.   Objective:   Physical Exam:   There were no vitals taken for this visit.  Physical Exam Constitutional:      General: She is not in acute distress. Musculoskeletal:        General: No deformity.  Neurological:     Mental Status: She is alert and oriented to person, place, and time.     Coordination: Coordination normal.  Psychiatric:        Attention and Perception: Attention and perception normal. She does not perceive auditory or visual hallucinations.        Mood and Affect: Mood normal. Mood is not anxious or depressed. Affect is not labile, blunt, angry or inappropriate.        Speech: Speech normal.        Behavior: Behavior normal.  Thought Content: Thought content normal. Thought content is not paranoid or delusional. Thought content does not include homicidal or suicidal ideation. Thought content does not include homicidal or suicidal plan.        Cognition and Memory: Cognition and memory normal.        Judgment: Judgment normal.     Comments: Insight intact     Lab Review:     Component Value Date/Time   NA 145 (H) 04/14/2022 1438   K 3.5 04/14/2022 1438   CL 106 04/14/2022 1438   CO2 24 04/14/2022 1438   GLUCOSE 84 04/14/2022 1438   GLUCOSE 93 10/08/2020 2012   BUN 9 04/14/2022 1438   CREATININE 0.72 04/14/2022 1438   CALCIUM 9.2 04/14/2022 1438   PROT 6.6 04/14/2022 1438   ALBUMIN 4.1 04/14/2022 1438   AST 32 04/14/2022 1438   ALT 62 (H) 04/14/2022 1438   ALKPHOS 135 (H) 04/14/2022 1438   BILITOT 0.8 04/14/2022 1438   GFRNONAA >60 10/08/2020 2012   GFRAA 115 05/08/2020 1654       Component Value Date/Time   WBC 6.7 04/14/2022 1438   WBC 8.2 10/08/2020 2012   RBC 4.61 04/14/2022 1438   RBC 4.89 10/08/2020 2012   HGB 12.8 04/14/2022 1438   HCT 40.3 04/14/2022 1438   PLT 272 04/14/2022 1438   MCV 87 04/14/2022 1438   MCH 27.8 04/14/2022 1438   MCH 27.6 10/08/2020 2012   MCHC 31.8 04/14/2022 1438   MCHC 30.8 10/08/2020 2012   RDW 12.9 04/14/2022 1438   LYMPHSABS 2.3  04/14/2022 1438   MONOABS 0.6 10/08/2020 2012   EOSABS 0.1 04/14/2022 1438   BASOSABS 0.0 04/14/2022 1438    No results found for: "POCLITH", "LITHIUM"   No results found for: "PHENYTOIN", "PHENOBARB", "VALPROATE", "CBMZ"   .res Assessment: Plan:    Plan:  Patient currently out of work and is totally disabled. Out of work 08/04/2022 through 11/04/2022.  Seeing therapist - Lennice Sites  PDMP reviewed  Will reach out to [facility] for IOP.  Trazadone 50mg  at hs as needed   Cymbalta 60/30 daily Wellbutrin XL 150mg  - 3 daily Xanax 1mg  up to twice a day - usually taking it once to twice a week  Restart Vyvanse 30mg  daily D/C Adderall XR  Time spent with patient was 25 minutes. Greater than 50% of face to face time with patient was spent on counseling and coordination of care.    RTC 4 weeks  Patient advised to contact office with any questions, adverse effects, or acute worsening in signs and symptoms.   Discussed potential benefits, risk, and side effects of benzodiazepines to include potential risk of tolerance and dependence, as well as possible drowsiness. Advised patient not to drive if experiencing drowsiness and to take lowest possible effective dose to minimize risk of dependence and tolerance.   Discussed potential benefits, risks, and side effects of stimulants with patient to include increased heart rate, palpitations, insomnia, increased anxiety, increased irritability, or decreased appetite.  Instructed patient to contact office if experiencing any significant tolerability issues.   There are no diagnoses linked to this encounter.   Please see After Visit Summary for patient specific instructions.  Future Appointments  Date Time Provider Department Center  10/08/2022  1:20 PM Puja Caffey, Thereasa Solo, NP [facility] None    No orders of the defined types were placed in this encounter.   -------------------------------

## 2022-10-30 ENCOUNTER — Other Ambulatory Visit: Payer: Self-pay | Admitting: Adult Health

## 2022-10-30 DIAGNOSIS — F418 Other specified anxiety disorders: Secondary | ICD-10-CM

## 2022-11-04 ENCOUNTER — Other Ambulatory Visit: Payer: Self-pay | Admitting: Adult Health

## 2022-11-04 DIAGNOSIS — F411 Generalized anxiety disorder: Secondary | ICD-10-CM

## 2022-11-04 NOTE — Telephone Encounter (Signed)
Has appt. tomorrow

## 2022-11-05 ENCOUNTER — Encounter: Payer: Self-pay | Admitting: Adult Health

## 2022-11-05 ENCOUNTER — Ambulatory Visit (INDEPENDENT_AMBULATORY_CARE_PROVIDER_SITE_OTHER): Payer: Medicaid Other | Admitting: Adult Health

## 2022-11-05 DIAGNOSIS — G4700 Insomnia, unspecified: Secondary | ICD-10-CM | POA: Diagnosis not present

## 2022-11-05 DIAGNOSIS — F411 Generalized anxiety disorder: Secondary | ICD-10-CM

## 2022-11-05 DIAGNOSIS — F332 Major depressive disorder, recurrent severe without psychotic features: Secondary | ICD-10-CM

## 2022-11-05 DIAGNOSIS — F909 Attention-deficit hyperactivity disorder, unspecified type: Secondary | ICD-10-CM

## 2022-11-05 MED ORDER — LISDEXAMFETAMINE DIMESYLATE 30 MG PO CAPS: 30.0000 mg | ORAL_CAPSULE | Freq: Every day | ORAL | 0 refills | Status: DC

## 2022-11-05 MED ORDER — BUPROPION HCL ER (XL) 150 MG PO TB24: 450.0000 mg | ORAL_TABLET | Freq: Every day | ORAL | 5 refills | Status: DC

## 2022-11-05 MED ORDER — TRAZODONE HCL 50 MG PO TABS: 50.0000 mg | ORAL_TABLET | Freq: Every day | ORAL | 5 refills | Status: DC

## 2022-11-05 MED ORDER — DULOXETINE HCL 30 MG PO CPEP: 30.0000 mg | ORAL_CAPSULE | Freq: Every day | ORAL | 5 refills | Status: DC

## 2022-11-05 MED ORDER — ALPRAZOLAM 1 MG PO TABS: 1.0000 mg | ORAL_TABLET | Freq: Two times a day (BID) | ORAL | 2 refills | Status: DC | PRN

## 2022-11-05 MED ORDER — DULOXETINE HCL 60 MG PO CPEP: 60.0000 mg | ORAL_CAPSULE | Freq: Every day | ORAL | 5 refills | Status: DC

## 2022-11-05 NOTE — Progress Notes (Signed)
Samantha Romero [id number] 1976-01-27 47 y.o.  Subjective:   Patient ID:  Samantha Romero is a 47 y.o. (DOB Nov 08, 1975) female.  Chief Complaint: No chief complaint on file.   HPI Samantha Romero presents to the office today for follow-up of MDD, GAD, ADHD, and insomnia.  Describes mood today as "down". Pleasant. Tearful at times. Mood symptoms - reports depression and anxiety. Reports social anxiety. Reports irritability - "a little bit". Reports worry, rumination, and over thinking. Reports somr obsessive thoughts and acts. Reports remains lower. Stating "I am hanging in there". Reports varying interest and motivation. Reports taking medications consistently.  Energy levels lower. Active, does not have a regular exercise routine.   Enjoys some usual interests and activities. Living with mother. Involved in a long term relationship. Has family local - mother, brother, and extended family. Spending time with family.  Appetite adequate. Weight gain. Reports sleeping better some nights than others at night - more environmental. Averages 5 hours. Focus and concentration improved with restarting Vyvanse. Completing tasks. Managing aspects of household. Looking for a job - interviewing.   Denies SI or HI.  Denies AH or VH. Denies self harm. Denies substance use.  Previous medication trials: Topamax   PHQ2-9    Flowsheet Row Office Visit from 03/24/2022 in [facility] INTENSIVE CARDIAC REHAB ORIENT from 02/25/2022 in [facility] Office Visit from 01/06/2022 in [facility] Nutrition from 04/20/2021 in [facility] at [facility] Visit from 01/26/2021 in [facility]  PHQ-2 Total Score 3 5 1  0 0  PHQ-9 Total Score 16 19 10  -- 14      Flowsheet Row ED from 04/14/2022 in [facility] at [facility] ED from 10/08/2020 in [facility] Emergency Department at [facility]  C-SSRS RISK CATEGORY No Risk No Risk        Review of Systems:  Review of Systems  Musculoskeletal:  Negative for gait problem.  Neurological:  Negative for tremors.  Psychiatric/Behavioral:         Please refer to HPI    Medications: I have reviewed the patient's current medications.  Current Outpatient Medications  Medication Sig Dispense Refill   albuterol (PROVENTIL) (2.5 MG/3ML) 0.083% nebulizer solution Take 3 mLs (2.5 mg total) by nebulization every 6 (six) hours as needed for wheezing or shortness of breath. 150 mL 1   albuterol (VENTOLIN HFA) 108 (90 Base) MCG/ACT inhaler Inhale 2 puffs into the lungs every 6 (six) hours as needed for wheezing or shortness of breath. 8 g 2   ALPRAZolam (XANAX) 1 MG tablet Take 1 tablet (1 mg total) by mouth 2 (two) times daily as needed for anxiety. 20 tablet 1   azelastine (ASTELIN) 0.1 % nasal spray Place 2 sprays into both nostrils 2 (two) times daily. 30 mL 5   buPROPion (WELLBUTRIN XL) 150 MG 24 hr tablet Take 3 tablets (450 mg total) by mouth daily. 270 tablet 2   carvedilol (COREG) 25 MG tablet TAKE 1 TABLET (25 MG TOTAL) BY MOUTH TWICE A DAY WITH MEALS 180 tablet 3   DULoxetine (CYMBALTA) 30 MG capsule Take 1 capsule (30 mg total) by mouth daily. 30 capsule 0   DULoxetine (CYMBALTA) 60 MG capsule Take 1 capsule (60 mg total) by mouth daily. 30 capsule 5   empagliflozin (JARDIANCE) 10 MG  TABS tablet TAKE ONE TABLET BY MOUTH DAILY BEFORE BREAKFAST 90 tablet 3   Fluticasone-Salmeterol 232-14 MCG/ACT AEPB Inhale 1 puff into the lungs in the morning and at bedtime. 1 each 5   furosemide (LASIX) 40 MG tablet Take 1 tablet (40 mg total) by mouth daily. You may take 1 extra tablet (40 mg total) by mouth daily as needed for lower extremity swelling/weight gain. 105 tablet 2   levocetirizine (XYZAL) 5 MG tablet TAKE 1 TABLET BY MOUTH  EVERY DAY IN THE EVENING 90 tablet 0   levonorgestrel (MIRENA, 52 MG,) 20 MCG/DAY IUD      lisdexamfetamine (VYVANSE) 30 MG capsule Take 1 capsule (30 mg total) by mouth daily. 30 capsule 0   meloxicam (MOBIC) 15 MG tablet TAKE 1 TABLET BY MOUTH EVERY DAY AS NEEDED FOR PAIN 30 tablet 0   montelukast (SINGULAIR) 10 MG tablet TAKE 1 TABLET BY MOUTH EVERYDAY AT BEDTIME 90 tablet 2   omeprazole (PRILOSEC) 40 MG capsule TAKE 1 CAPSULE BY MOUTH EVERY DAY (Patient not taking: Reported on 03/24/2022) 90 capsule 1   promethazine-dextromethorphan (PROMETHAZINE-DM) 6.25-15 MG/5ML syrup Take 5 mLs by mouth at bedtime as needed for cough. 118 mL 0   sacubitril-valsartan (ENTRESTO) 97-103 MG Take 1 tablet by mouth 2 (two) times daily. 60 tablet 11   spironolactone (ALDACTONE) 25 MG tablet Take 1 tablet (25 mg total) by mouth daily. 90 tablet 3   traZODone (DESYREL) 50 MG tablet Take 1 tablet (50 mg total) by mouth at bedtime. 30 tablet 1   Vitamin D, Ergocalciferol, (DRISDOL) 1.25 MG (50000 UNIT) CAPS capsule Take 50,000 Units by mouth once a week.     No current facility-administered medications for this visit.    Medication Side Effects: None  Allergies:  Allergies  Allergen Reactions   Grass Extracts [Gramineae Pollens]     Nasal congestion, post nasal drip    Shellfish Allergy Itching    In throat only.    Past Medical History:  Diagnosis Date   Anxiety    panic attacks   Asthma    CHF (congestive heart failure), NYHA class II, acute, systolic (HCC) 10/06/2018   Depression    Fibromyalgia    Hypertension    no meds currently   No pertinent past medical history    Pneumonia    hx of    Sinus problem     Past Medical History, Surgical history, Social history, and Family history were reviewed and updated as appropriate.   Please see review of systems for further details on the patient's review from today.   Objective:   Physical Exam:  There were no vitals taken for this  visit.  Physical Exam Constitutional:      General: She is not in acute distress. Musculoskeletal:        General: No deformity.  Neurological:     Mental Status: She is alert and oriented to person, place, and time.     Coordination: Coordination normal.  Psychiatric:        Attention and Perception: Attention and perception normal. She does not perceive auditory or visual hallucinations.        Mood and Affect: Affect is not labile, blunt, angry or inappropriate.        Speech: Speech normal.        Behavior: Behavior normal.        Thought Content: Thought content normal. Thought content is not paranoid or delusional. Thought content does not include homicidal  or suicidal ideation. Thought content does not include homicidal or suicidal plan.        Cognition and Memory: Cognition and memory normal.        Judgment: Judgment normal.     Comments: Insight intact     Lab Review:     Component Value Date/Time   NA 145 (H) 04/14/2022 1438   K 3.5 04/14/2022 1438   CL 106 04/14/2022 1438   CO2 24 04/14/2022 1438   GLUCOSE 84 04/14/2022 1438   GLUCOSE 93 10/08/2020 2012   BUN 9 04/14/2022 1438   CREATININE 0.72 04/14/2022 1438   CALCIUM 9.2 04/14/2022 1438   PROT 6.6 04/14/2022 1438   ALBUMIN 4.1 04/14/2022 1438   AST 32 04/14/2022 1438   ALT 62 (H) 04/14/2022 1438   ALKPHOS 135 (H) 04/14/2022 1438   BILITOT 0.8 04/14/2022 1438   GFRNONAA >60 10/08/2020 2012   GFRAA 115 05/08/2020 1654       Component Value Date/Time   WBC 6.7 04/14/2022 1438   WBC 8.2 10/08/2020 2012   RBC 4.61 04/14/2022 1438   RBC 4.89 10/08/2020 2012   HGB 12.8 04/14/2022 1438   HCT 40.3 04/14/2022 1438   PLT 272 04/14/2022 1438   MCV 87 04/14/2022 1438   MCH 27.8 04/14/2022 1438   MCH 27.6 10/08/2020 2012   MCHC 31.8 04/14/2022 1438   MCHC 30.8 10/08/2020 2012   RDW 12.9 04/14/2022 1438   LYMPHSABS 2.3 04/14/2022 1438   MONOABS 0.6 10/08/2020 2012   EOSABS 0.1 04/14/2022 1438   BASOSABS  0.0 04/14/2022 1438    No results found for: "POCLITH", "LITHIUM"   No results found for: "PHENYTOIN", "PHENOBARB", "VALPROATE", "CBMZ"   .res Assessment: Plan:    Plan:  Patient currently out of work and is totally disabled. Out of work 08/04/2022 through 12/05/2022.  Seeing therapist - Lennice Sites  PDMP reviewed  Trazadone 50mg  at hs as needed   Cymbalta 60/30 daily Wellbutrin XL 150mg  - 3 daily Xanax 1mg  up to twice a day - usually taking it once to twice a week Vyvanse 30mg  daily  RTC 4 weeks  Patient advised to contact office with any questions, adverse effects, or acute worsening in signs and symptoms.   Discussed potential benefits, risk, and side effects of benzodiazepines to include potential risk of tolerance and dependence, as well as possible drowsiness. Advised patient not to drive if experiencing drowsiness and to take lowest possible effective dose to minimize risk of dependence and tolerance.   Discussed potential benefits, risks, and side effects of stimulants with patient to include increased heart rate, palpitations, insomnia, increased anxiety, increased irritability, or decreased appetite.  Instructed patient to contact office if experiencing any significant tolerability issues.  There are no diagnoses linked to this encounter.   Please see After Visit Summary for patient specific instructions.  Future Appointments  Date Time Provider Department Center  11/05/2022  2:40 PM Abdoulie Tierce, Thereasa Solo, NP CP-CP None    No orders of the defined types were placed in this encounter.   -------------------------------

## 2022-11-17 IMAGING — DX DG CHEST 2V
2 series · 2 of 2 positions shown · non-contrast
Comparison: 10/05/2018

CLINICAL DATA: Shortness of breath and chest pain

EXAM:
CHEST - 2 VIEW

[chest pa]
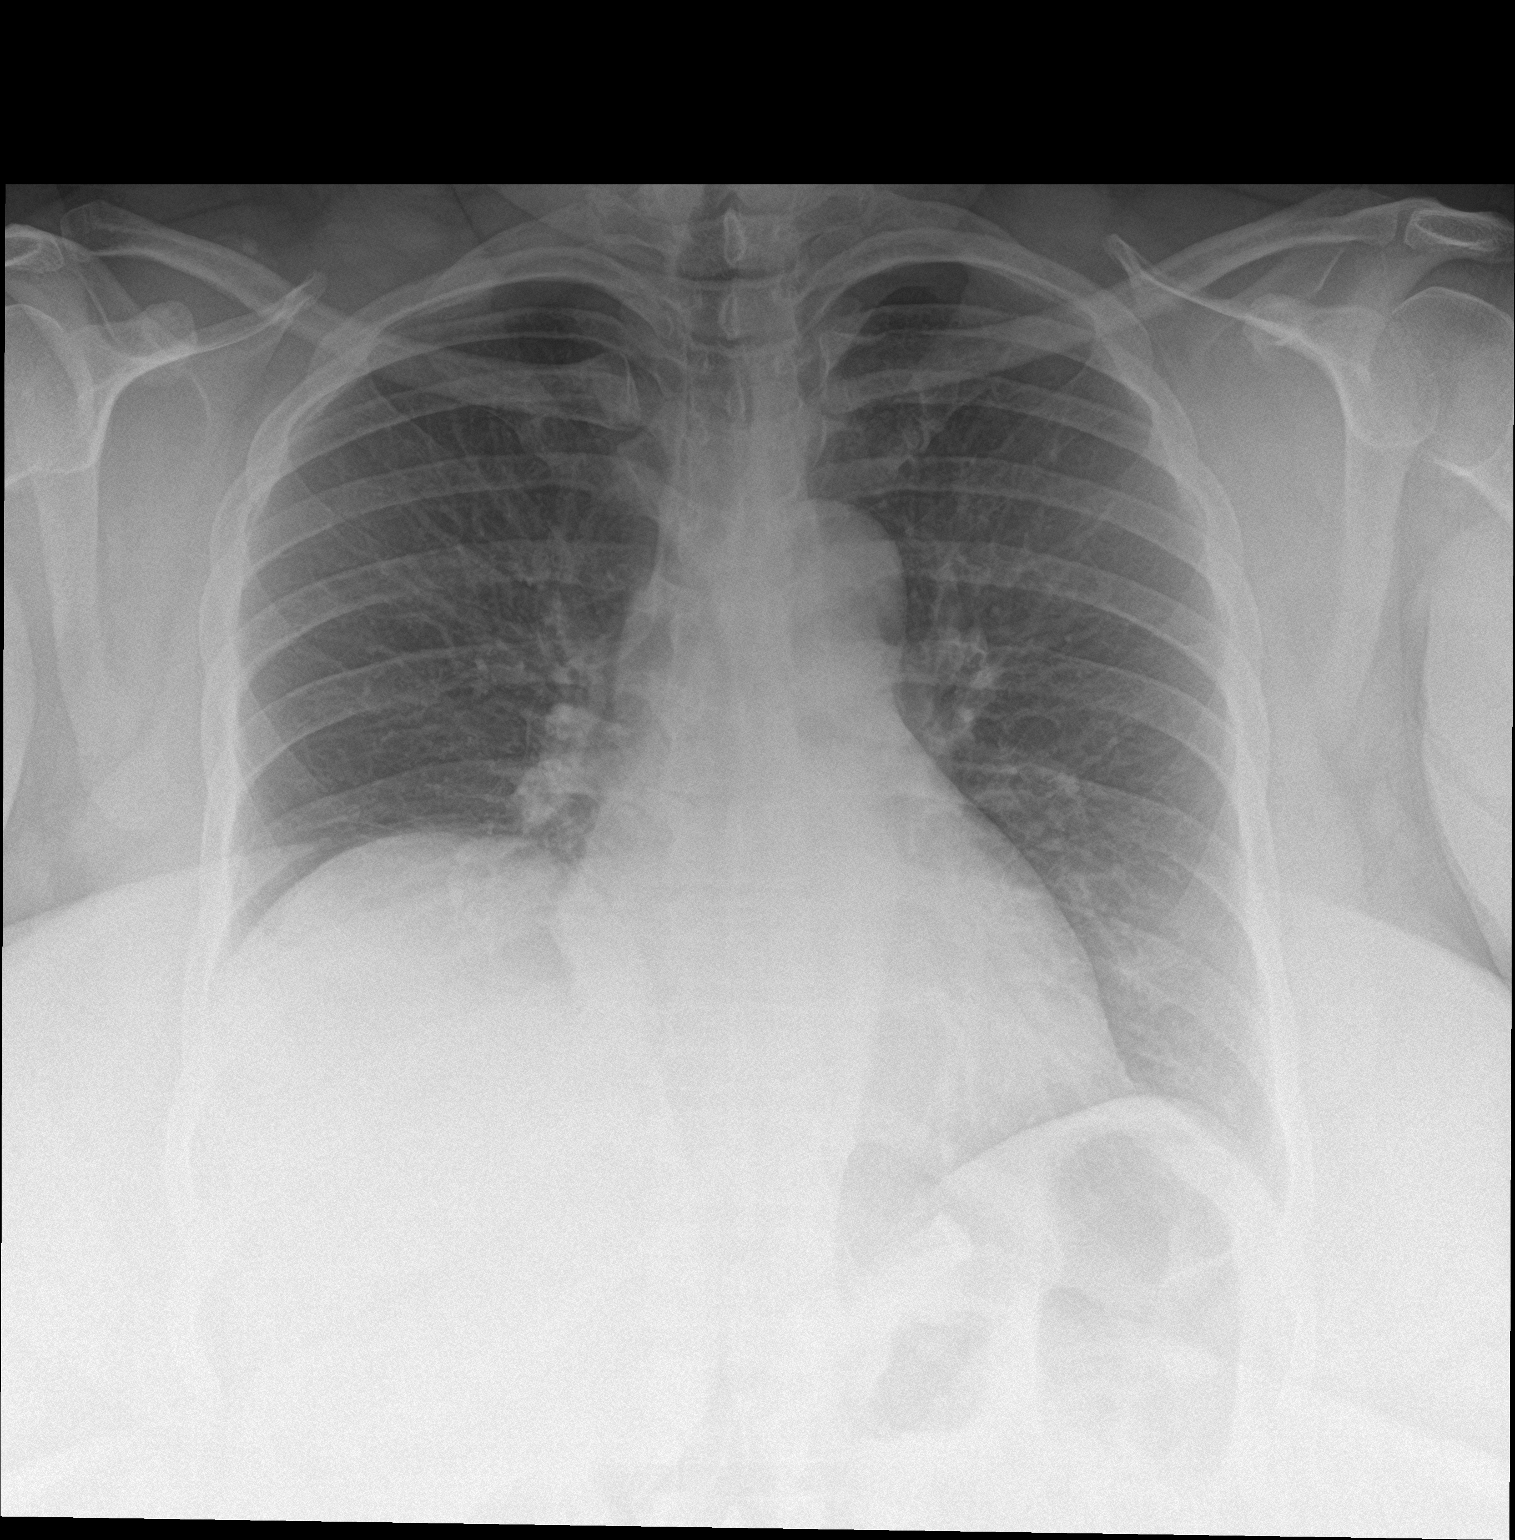

[chest lat]
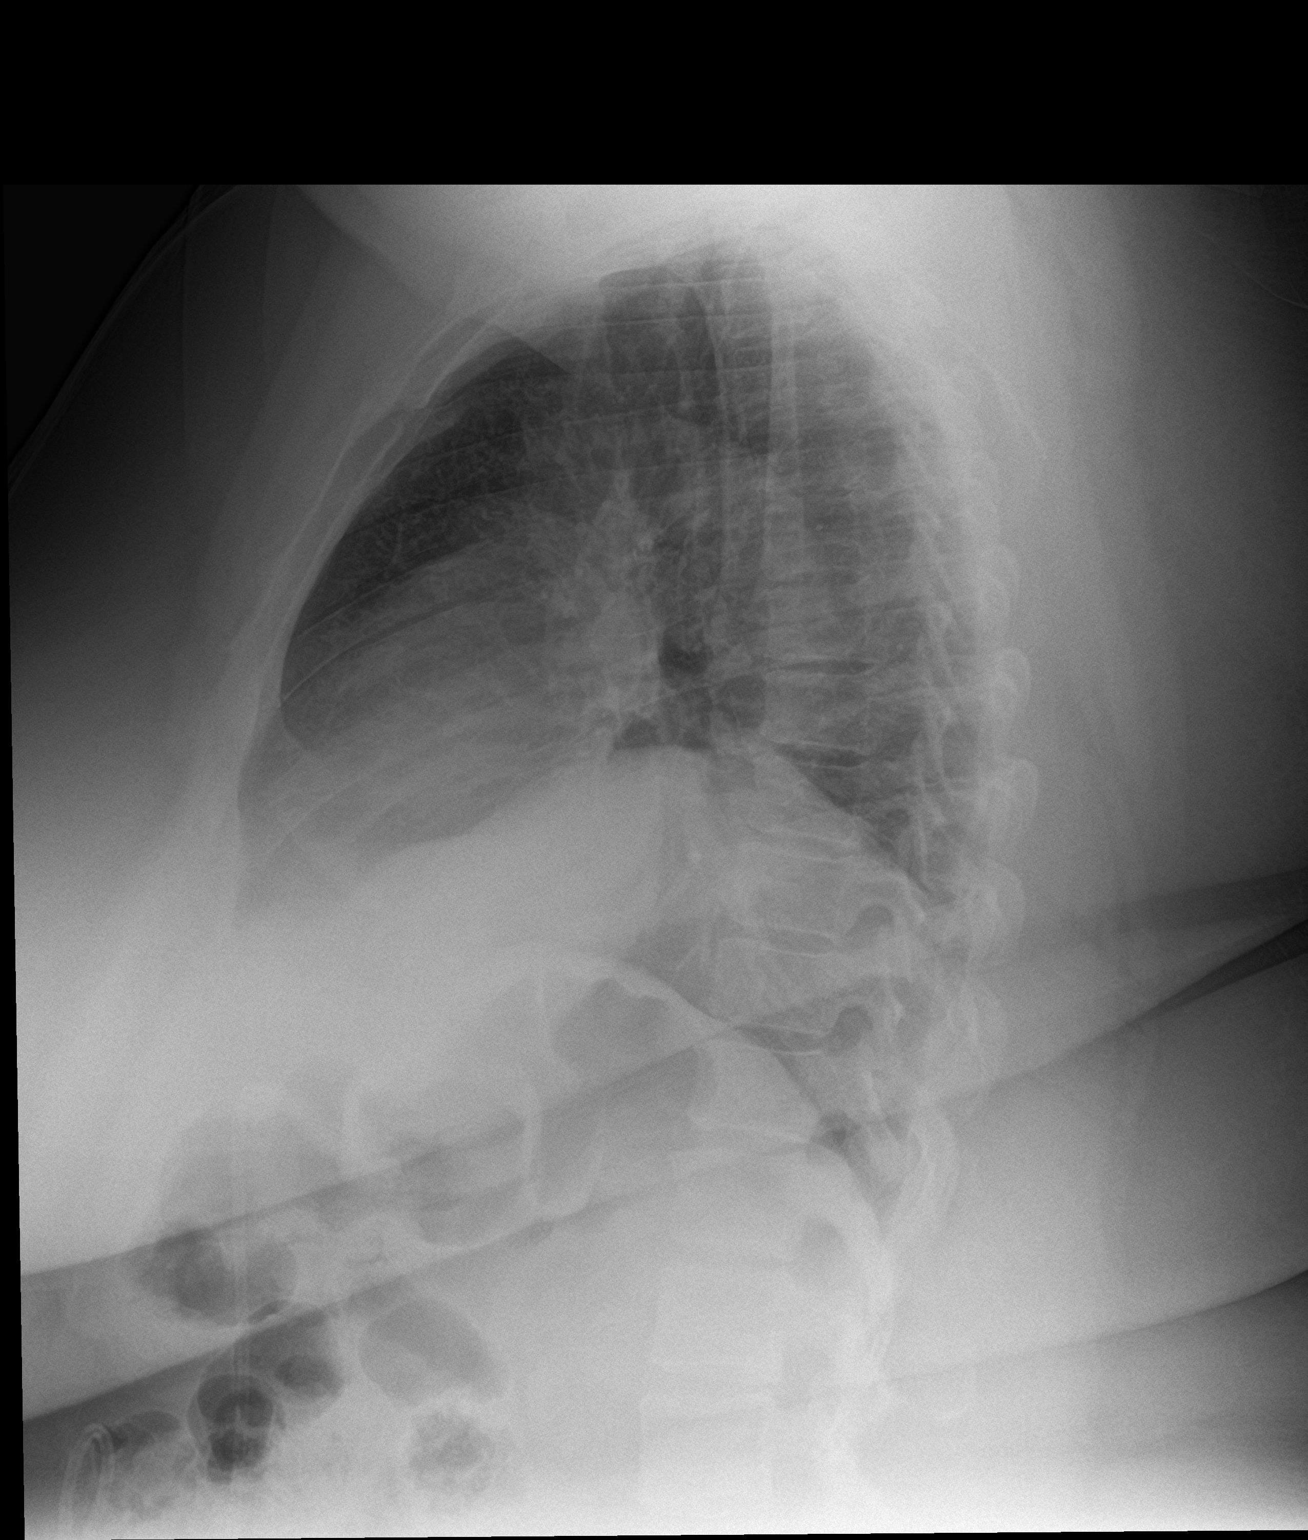

[2 of 2 positions shown; findings below may reference images not displayed]

FINDINGS: Elevation of the right hemidiaphragm is noted. Cardiac shadow is
within normal limits. The lungs are clear. No acute bony abnormality
is noted.
IMPRESSION: No active cardiopulmonary disease.

## 2022-12-01 NOTE — Progress Notes (Unsigned)
Madelaine Bhat, CMA,acting as a Neurosurgeon for [name], FNP.,have documented all relevant documentation on the behalf of [name], FNP,as directed by  [name], FNP while in the presence of [name], FNP.  Subjective:  Patient ID: Samantha Romero , female    DOB: 05/09/1975 , 47 y.o.   MRN: [id number]  No chief complaint on file.   HPI  Patient presents today for pain and allergies, Patient reports compliance with medications. Patient denies any Chest pain, SOB, and headaches. Patient has no concerns today.     Past Medical History:  Diagnosis Date  . Anxiety    panic attacks  . Asthma   . CHF (congestive heart failure), NYHA class II, acute, systolic (HCC) 10/06/2018  . Depression   . Fibromyalgia   . Hypertension    no meds currently  . No pertinent past medical history   . Pneumonia    hx of   . Sinus problem      Family History  Problem Relation Age of Onset  . Ulcers Mother   . Hypertension Mother   . Cancer Maternal Uncle        lung  . Cancer Maternal Grandfather        lung and back     Current Outpatient Medications:  .  albuterol (PROVENTIL) (2.5 MG/3ML) 0.083% nebulizer solution, Take 3 mLs (2.5 mg total) by nebulization every 6 (six) hours as needed for wheezing or shortness of breath., Disp: 150 mL, Rfl: 1 .  albuterol (VENTOLIN HFA) 108 (90 Base) MCG/ACT inhaler, Inhale 2 puffs into the lungs every 6 (six) hours as needed for wheezing or shortness of breath., Disp: 8 g, Rfl: 2 .  ALPRAZolam (XANAX) 1 MG tablet, Take 1 tablet (1 mg total) by mouth 2 (two) times daily as needed for anxiety., Disp: 30 tablet, Rfl: 2 .  azelastine (ASTELIN) 0.1 % nasal spray, Place 2 sprays into both nostrils 2 (two) times daily., Disp: 30 mL, Rfl: 5 .  buPROPion (WELLBUTRIN XL) 150 MG 24 hr tablet, Take 3 tablets (450 mg total) by mouth daily., Disp: 90 tablet, Rfl: 5 .  carvedilol (COREG) 25 MG tablet, TAKE 1 TABLET (25 MG TOTAL) BY MOUTH TWICE A DAY WITH MEALS,  Disp: 180 tablet, Rfl: 3 .  DULoxetine (CYMBALTA) 30 MG capsule, Take 1 capsule (30 mg total) by mouth daily., Disp: 30 capsule, Rfl: 5 .  DULoxetine (CYMBALTA) 60 MG capsule, Take 1 capsule (60 mg total) by mouth daily., Disp: 30 capsule, Rfl: 5 .  empagliflozin (JARDIANCE) 10 MG TABS tablet, TAKE ONE TABLET BY MOUTH DAILY BEFORE BREAKFAST, Disp: 90 tablet, Rfl: 3 .  Fluticasone-Salmeterol 232-14 MCG/ACT AEPB, Inhale 1 puff into the lungs in the morning and at bedtime., Disp: 1 each, Rfl: 5 .  furosemide (LASIX) 40 MG tablet, Take 1 tablet (40 mg total) by mouth daily. You may take 1 extra tablet (40 mg total) by mouth daily as needed for lower extremity swelling/weight gain., Disp: 105 tablet, Rfl: 2 .  levocetirizine (XYZAL) 5 MG tablet, TAKE 1 TABLET BY MOUTH EVERY DAY IN THE EVENING, Disp: 90 tablet, Rfl: 0 .  levonorgestrel (MIRENA, 52 MG,) 20 MCG/DAY IUD, , Disp: , Rfl:  .  lisdexamfetamine (VYVANSE) 30 MG capsule, Take 1 capsule (30 mg total) by mouth daily., Disp: 30 capsule, Rfl: 0 .  [START ON 12/03/2022] lisdexamfetamine (VYVANSE) 30 MG capsule, Take 1 capsule (30 mg total) by mouth daily., Disp: 30 capsule, Rfl: 0 .  meloxicam (MOBIC) 15 MG tablet, TAKE 1 TABLET BY MOUTH EVERY DAY AS NEEDED FOR PAIN, Disp: 30 tablet, Rfl: 0 .  montelukast (SINGULAIR) 10 MG tablet, TAKE 1 TABLET BY MOUTH EVERYDAY AT BEDTIME, Disp: 90 tablet, Rfl: 2 .  omeprazole (PRILOSEC) 40 MG capsule, TAKE 1 CAPSULE BY MOUTH EVERY DAY (Patient not taking: Reported on 03/24/2022), Disp: 90 capsule, Rfl: 1 .  promethazine-dextromethorphan (PROMETHAZINE-DM) 6.25-15 MG/5ML syrup, Take 5 mLs by mouth at bedtime as needed for cough., Disp: 118 mL, Rfl: 0 .  sacubitril-valsartan (ENTRESTO) 97-103 MG, Take 1 tablet by mouth 2 (two) times daily., Disp: 60 tablet, Rfl: 11 .  spironolactone (ALDACTONE) 25 MG tablet, Take 1 tablet (25 mg total) by mouth daily., Disp: 90 tablet, Rfl: 3 .  traZODone (DESYREL) 50 MG tablet, Take 1  tablet (50 mg total) by mouth at bedtime., Disp: 30 tablet, Rfl: 5 .  Vitamin D, Ergocalciferol, (DRISDOL) 1.25 MG (50000 UNIT) CAPS capsule, Take 50,000 Units by mouth once a week., Disp: , Rfl:    Allergies  Allergen Reactions  . Grass Extracts [Gramineae Pollens]     Nasal congestion, post nasal drip   . Shellfish Allergy Itching    In throat only.     Review of Systems   There were no vitals filed for this visit. There is no height or weight on file to calculate BMI.  Wt Readings from Last 3 Encounters:  04/19/22 (!) 320 lb (145.2 kg)  03/24/22 (!) 316 lb 9.6 oz (143.6 kg)  02/25/22 (!) 323 lb 6.6 oz (146.7 kg)    The ASCVD Risk score (Arnett DK, et al., 2019) failed to calculate for the following reasons:   Cannot find a previous HDL lab   Cannot find a previous total cholesterol lab  Objective:  Physical Exam      Assessment And Plan:  There are no diagnoses linked to this encounter.  No follow-ups on file.  Patient was given opportunity to ask questions. Patient verbalized understanding of the plan and was able to repeat key elements of the plan. All questions were answered to their satisfaction.    Jeanell Sparrow, FNP, have reviewed all documentation for this visit. The documentation on 12/01/22 for the exam, diagnosis, procedures, and orders are all accurate and complete.   IF YOU HAVE BEEN REFERRED TO A SPECIALIST, IT MAY TAKE 1-2 WEEKS TO SCHEDULE/PROCESS THE REFERRAL. IF YOU HAVE NOT HEARD FROM US/SPECIALIST IN TWO WEEKS, PLEASE GIVE [place] A CALL AT [phone].

## 2022-12-02 ENCOUNTER — Ambulatory Visit: Payer: Medicaid Other | Admitting: Nurse Practitioner

## 2022-12-21 ENCOUNTER — Telehealth: Payer: Self-pay | Admitting: Pharmacist

## 2022-12-21 NOTE — Progress Notes (Signed)
Patient appearing on report for [employer] - Hypertension Control report due to last documented ambulatory blood pressure of 120/95 on 05/17/22. Next appointment with PCP is not scheduled.   Attempted to outreach patient to discuss any home readings. Left voicemail, will also send MyChart.   Catie Eppie Gibson, PharmD, BCACP, CPP Clinical Pharmacist [facility] [phone]

## 2022-12-29 ENCOUNTER — Encounter: Payer: Self-pay | Admitting: Cardiology

## 2022-12-29 ENCOUNTER — Ambulatory Visit: Payer: Medicaid Other | Attending: Cardiology | Admitting: Cardiology

## 2022-12-29 DIAGNOSIS — I5020 Unspecified systolic (congestive) heart failure: Secondary | ICD-10-CM | POA: Insufficient documentation

## 2022-12-29 DIAGNOSIS — G4733 Obstructive sleep apnea (adult) (pediatric): Secondary | ICD-10-CM | POA: Insufficient documentation

## 2022-12-29 NOTE — Progress Notes (Unsigned)
  Cardiology Office Note:  .   Date:  12/29/2022  ID:  Samantha Romero, DOB 10-Aug-1975, MRN [id number] PCP: [name], FNP  [facility] Providers Cardiologist:  Truett Mainland, MD PCP: [name], FNP  No chief complaint on file.     History of Present Illness: .   Samantha Romero DOCTER is a 47 y.o. female with hypertension, nonischemic cardiomyopathy with recovered LVEF, h/o medication noncompliance, morbid obesity, h/o bariatric surgery, OSA on CPAP, anxiety, depression  Patient was previously seen by Dr. Shari Prows and Dr. Katrinka Blazing, last in 12/2021.  There were no vitals filed for this visit.   ROS: *** ROS   Studies Reviewed: Marland Kitchen       ECHOCARDIOGRAM 09/2021: IMPRESSIONS   1. Left ventricular ejection fraction, by estimation, is 60 to 65%. The  left ventricle has normal function. The left ventricle has no regional  wall motion abnormalities. Left ventricular diastolic parameters were  normal.   2. Right ventricular systolic function is normal. The right ventricular  size is normal. Tricuspid regurgitation signal is inadequate for assessing  PA pressure.   3. The mitral valve is normal in structure. No evidence of mitral valve  regurgitation. No evidence of mitral stenosis.   4. The aortic valve was not well visualized. Aortic valve regurgitation  is not visualized.   5. The inferior vena cava is normal in size with greater than 50%  respiratory variability, suggesting right atrial pressure of 3 mmHg.   *** Risk Assessment/Calculations:   {Does this patient have ATRIAL FIBRILLATION?[phone]}   Physical Exam:   Physical Exam   VISIT DIAGNOSES:   ICD-10-CM   1. HFrEF (heart failure with reduced ejection fraction) (HCC)  I50.20     2. OSA on CPAP  G47.33     3. Morbid obesity (HCC)  E66.01        ASSESSMENT AND PLAN: .    Samantha Romero is a 47 y.o. female with hypertension, nonischemic cardiomyopathy with recovered LVEF, h/o medication  noncompliance, morbid obesity, h/o bariatric surgery, OSA on CPAP, anxiety, depression  Chronic systolic heart failure: Nonischemic cardiomyopathy with recovered LVEF as of 09/2021. NYHA class ***symptoms. Currently on GDMT for HFrEF including carvedilol 25 mg twice daily, Jardiance 10 mg daily, Entresto 97-103 mg twice daily, spironolactone 25 mg daily. ***  {Are you ordering a CV Procedure (e.g. stress test, cath, DCCV, TEE, etc)?   Press F2        :[id number]}    No orders of the defined types were placed in this encounter.    F/u in ***  Signed, Elder Negus, MD This encounter was created in error - please disregard.

## 2022-12-30 NOTE — Progress Notes (Signed)
This encounter was created in error - please disregard.

## 2023-01-24 ENCOUNTER — Other Ambulatory Visit: Payer: Self-pay | Admitting: Adult Health

## 2023-01-24 DIAGNOSIS — F411 Generalized anxiety disorder: Secondary | ICD-10-CM

## 2023-01-24 NOTE — Telephone Encounter (Signed)
Sent MyChart message to schedule FU.

## 2023-01-28 ENCOUNTER — Encounter: Payer: Self-pay | Admitting: Adult Health

## 2023-01-28 ENCOUNTER — Ambulatory Visit (INDEPENDENT_AMBULATORY_CARE_PROVIDER_SITE_OTHER): Payer: Medicaid Other | Admitting: Adult Health

## 2023-01-28 DIAGNOSIS — G4700 Insomnia, unspecified: Secondary | ICD-10-CM | POA: Diagnosis not present

## 2023-01-28 DIAGNOSIS — F909 Attention-deficit hyperactivity disorder, unspecified type: Secondary | ICD-10-CM

## 2023-01-28 DIAGNOSIS — F332 Major depressive disorder, recurrent severe without psychotic features: Secondary | ICD-10-CM | POA: Diagnosis not present

## 2023-01-28 DIAGNOSIS — F411 Generalized anxiety disorder: Secondary | ICD-10-CM

## 2023-01-28 MED ORDER — LISDEXAMFETAMINE DIMESYLATE 50 MG PO CAPS: 50.0000 mg | ORAL_CAPSULE | Freq: Every day | ORAL | 0 refills | Status: DC

## 2023-01-28 NOTE — Progress Notes (Signed)
Samantha Romero [id number] 09/07/1975 47 y.o.  Subjective:   Patient ID:  Samantha Romero is a 47 y.o. (DOB 17-Nov-1975) female.  Chief Complaint: No chief complaint on file.   HPI Chosen CHARDONAY Romero presents to the office today for follow-up of MDD, GAD, ADHD, and insomnia.  Describes mood today as "down". Pleasant. Tearful at times. Mood symptoms - reports depression, anxiety and irritability. Reports panic attacks. Reports social anxiety. Reports worry, rumination, and over thinking. Reports some obsessive thoughts and acts. Reports mood as lower. Stating "I'm in a constant state of worry". Reports varying interest and motivation. Reports taking medications consistently and feel like they are helpful - would like to consider other options. Working with a Paramedic. Energy levels lower. Active, does not have a regular exercise routine.   Enjoys some usual interests and activities. Living with mother. Involved in a long term relationship. Has family local - mother, brother, and extended family. Spending time with family.  Appetite adequate. Weight gain. Reports sleeping better some nights than others at night. Averages 4 to 5 hours. Reports focus and concentration difficulties - taking Vyvanse 30mg  daily - would like to increase dose to 40mg  daily. Completing tasks. Managing aspects of household. Looking for a job - interviewing. Working a temporary job this week.  Denies SI or HI.  Denies AH or VH. Denies self harm. Denies substance use.  Previous medication trials: Topamax   PHQ2-9    Flowsheet Row Office Visit from 03/24/2022 in [facility] INTENSIVE CARDIAC REHAB ORIENT from 02/25/2022 in [facility] Office Visit from 01/06/2022 in [facility] Nutrition from 04/20/2021 in [facility] at [facility] Visit from 01/26/2021  in [facility]  PHQ-2 Total Score 3 5 1  0 0  PHQ-9 Total Score 16 19 10  -- 14      Flowsheet Row ED from 04/14/2022 in [facility] at [facility]) ED from 10/08/2020 in [facility] Emergency Department at [facility]  C-SSRS RISK CATEGORY No Risk No Risk        Review of Systems:  Review of Systems  Musculoskeletal:  Negative for gait problem.  Neurological:  Negative for tremors.  Psychiatric/Behavioral:         Please refer to HPI    Medications: I have reviewed the patient's current medications.  Current Outpatient Medications  Medication Sig Dispense Refill   albuterol (PROVENTIL) (2.5 MG/3ML) 0.083% nebulizer solution Take 3 mLs (2.5 mg total) by nebulization every 6 (six) hours as needed for wheezing or shortness of breath. 150 mL 1   albuterol (VENTOLIN HFA) 108 (90 Base) MCG/ACT inhaler Inhale 2 puffs into the lungs every 6 (six) hours as needed for wheezing or shortness of breath. 8 g 2   ALPRAZolam (XANAX) 1 MG tablet TAKE 1 TABLET BY MOUTH 2 TIMES DAILY AS NEEDED FOR ANXIETY. 30 tablet 0   azelastine (ASTELIN) 0.1 % nasal spray Place 2 sprays into both nostrils 2 (two) times daily. 30 mL 5   buPROPion (WELLBUTRIN XL) 150 MG 24 hr tablet Take 3 tablets (450 mg total) by mouth daily. 90 tablet 5   carvedilol (COREG) 25 MG tablet TAKE 1 TABLET (25 MG TOTAL) BY MOUTH TWICE A DAY WITH MEALS 180 tablet 3   DULoxetine (CYMBALTA) 30 MG capsule Take 1 capsule (30 mg total) by mouth  daily. 30 capsule 5   DULoxetine (CYMBALTA) 60 MG capsule Take 1 capsule (60 mg total) by mouth daily. 30 capsule 5   empagliflozin (JARDIANCE) 10 MG TABS tablet TAKE ONE TABLET BY MOUTH DAILY BEFORE BREAKFAST 90 tablet 3   Fluticasone-Salmeterol 232-14 MCG/ACT AEPB Inhale 1 puff into the lungs in the morning and at bedtime. 1 each 5   furosemide (LASIX) 40 MG tablet Take 1 tablet (40 mg total) by mouth daily. You may take 1 extra  tablet (40 mg total) by mouth daily as needed for lower extremity swelling/weight gain. 105 tablet 2   levocetirizine (XYZAL) 5 MG tablet TAKE 1 TABLET BY MOUTH EVERY DAY IN THE EVENING 90 tablet 0   levonorgestrel (MIRENA, 52 MG,) 20 MCG/DAY IUD      lisdexamfetamine (VYVANSE) 30 MG capsule Take 1 capsule (30 mg total) by mouth daily. 30 capsule 0   lisdexamfetamine (VYVANSE) 30 MG capsule Take 1 capsule (30 mg total) by mouth daily. 30 capsule 0   meloxicam (MOBIC) 15 MG tablet TAKE 1 TABLET BY MOUTH EVERY DAY AS NEEDED FOR PAIN 30 tablet 0   montelukast (SINGULAIR) 10 MG tablet TAKE 1 TABLET BY MOUTH EVERYDAY AT BEDTIME 90 tablet 2   omeprazole (PRILOSEC) 40 MG capsule TAKE 1 CAPSULE BY MOUTH EVERY DAY (Patient not taking: Reported on 03/24/2022) 90 capsule 1   promethazine-dextromethorphan (PROMETHAZINE-DM) 6.25-15 MG/5ML syrup Take 5 mLs by mouth at bedtime as needed for cough. 118 mL 0   sacubitril-valsartan (ENTRESTO) 97-103 MG Take 1 tablet by mouth 2 (two) times daily. 60 tablet 11   spironolactone (ALDACTONE) 25 MG tablet Take 1 tablet (25 mg total) by mouth daily. 90 tablet 3   traZODone (DESYREL) 50 MG tablet Take 1 tablet (50 mg total) by mouth at bedtime. 30 tablet 5   Vitamin D, Ergocalciferol, (DRISDOL) 1.25 MG (50000 UNIT) CAPS capsule Take 50,000 Units by mouth once a week.     No current facility-administered medications for this visit.    Medication Side Effects: None  Allergies:  Allergies  Allergen Reactions   Grass Extracts [Gramineae Pollens]     Nasal congestion, post nasal drip    Shellfish Allergy Itching    In throat only.    Past Medical History:  Diagnosis Date   Anxiety    panic attacks   Asthma    CHF (congestive heart failure), NYHA class II, acute, systolic (HCC) 10/06/2018   Depression    Fibromyalgia    Hypertension    no meds currently   No pertinent past medical history    Pneumonia    hx of    Sinus problem     Past Medical History,  Surgical history, Social history, and Family history were reviewed and updated as appropriate.   Please see review of systems for further details on the patient's review from today.   Objective:   Physical Exam:  There were no vitals taken for this visit.  Physical Exam  Lab Review:     Component Value Date/Time   NA 145 (H) 04/14/2022 1438   K 3.5 04/14/2022 1438   CL 106 04/14/2022 1438   CO2 24 04/14/2022 1438   GLUCOSE 84 04/14/2022 1438   GLUCOSE 93 10/08/2020 2012   BUN 9 04/14/2022 1438   CREATININE 0.72 04/14/2022 1438   CALCIUM 9.2 04/14/2022 1438   PROT 6.6 04/14/2022 1438   ALBUMIN 4.1 04/14/2022 1438   AST 32 04/14/2022 1438   ALT 62 (H)  04/14/2022 1438   ALKPHOS 135 (H) 04/14/2022 1438   BILITOT 0.8 04/14/2022 1438   GFRNONAA >60 10/08/2020 2012   GFRAA 115 05/08/2020 1654       Component Value Date/Time   WBC 6.7 04/14/2022 1438   WBC 8.2 10/08/2020 2012   RBC 4.61 04/14/2022 1438   RBC 4.89 10/08/2020 2012   HGB 12.8 04/14/2022 1438   HCT 40.3 04/14/2022 1438   PLT 272 04/14/2022 1438   MCV 87 04/14/2022 1438   MCH 27.8 04/14/2022 1438   MCH 27.6 10/08/2020 2012   MCHC 31.8 04/14/2022 1438   MCHC 30.8 10/08/2020 2012   RDW 12.9 04/14/2022 1438   LYMPHSABS 2.3 04/14/2022 1438   MONOABS 0.6 10/08/2020 2012   EOSABS 0.1 04/14/2022 1438   BASOSABS 0.0 04/14/2022 1438    No results found for: "POCLITH", "LITHIUM"   No results found for: "PHENYTOIN", "PHENOBARB", "VALPROATE", "CBMZ"   .res Assessment: Plan:    Plan:  Patient currently out of work and is totally disabled. Out of work 12/05/2022 through 04/05/2022.  Seeing therapist - Timmothy Sours - [facility]  PDMP reviewed  Trazadone 50mg  at hs as needed   Cymbalta 60/30 daily Wellbutrin XL 150mg  - 3 daily Xanax 1mg  up to twice a day - usually taking it once to twice a week  Increase Vyvanse 30mg  to 40mg  daily  RTC 4 weeks  Patient advised to contact office with any questions,  adverse effects, or acute worsening in signs and symptoms.   Discussed potential benefits, risk, and side effects of benzodiazepines to include potential risk of tolerance and dependence, as well as possible drowsiness. Advised patient not to drive if experiencing drowsiness and to take lowest possible effective dose to minimize risk of dependence and tolerance.   Discussed potential benefits, risks, and side effects of stimulants with patient to include increased heart rate, palpitations, insomnia, increased anxiety, increased irritability, or decreased appetite.  Instructed patient to contact office if experiencing any significant tolerability issues.   There are no diagnoses linked to this encounter.   Please see After Visit Summary for patient specific instructions.  Future Appointments  Date Time Provider Department Center  01/28/2023  2:00 PM Caressa Scearce, Thereasa Solo, NP [facility] None    No orders of the defined types were placed in this encounter.   -------------------------------

## 2023-02-08 NOTE — Progress Notes (Deleted)
Samantha Romero, CMA,acting as a Neurosurgeon for [name], FNP.,have documented all relevant documentation on the behalf of [name], FNP,as directed by  [name], FNP while in the presence of [name], FNP.  Subjective:  Patient ID: Samantha Romero , female    DOB: Jul 16, 1975 , 47 y.o.   MRN: [id number]  No chief complaint on file.   HPI  Patient presents today for a bp and dm follow up, Patient reports compliance with medication. Patient denies any chest pain, SOB, or headaches. Patient has no concerns today.     Past Medical History:  Diagnosis Date  . Anxiety    panic attacks  . Asthma   . CHF (congestive heart failure), NYHA class II, acute, systolic (HCC) 10/06/2018  . Depression   . Fibromyalgia   . Hypertension    no meds currently  . No pertinent past medical history   . Pneumonia    hx of   . Sinus problem      Family History  Problem Relation Age of Onset  . Ulcers Mother   . Hypertension Mother   . Cancer Maternal Uncle        lung  . Cancer Maternal Grandfather        lung and back     Current Outpatient Medications:  .  albuterol (PROVENTIL) (2.5 MG/3ML) 0.083% nebulizer solution, Take 3 mLs (2.5 mg total) by nebulization every 6 (six) hours as needed for wheezing or shortness of breath., Disp: 150 mL, Rfl: 1 .  albuterol (VENTOLIN HFA) 108 (90 Base) MCG/ACT inhaler, Inhale 2 puffs into the lungs every 6 (six) hours as needed for wheezing or shortness of breath., Disp: 8 g, Rfl: 2 .  ALPRAZolam (XANAX) 1 MG tablet, TAKE 1 TABLET BY MOUTH 2 TIMES DAILY AS NEEDED FOR ANXIETY., Disp: 30 tablet, Rfl: 0 .  azelastine (ASTELIN) 0.1 % nasal spray, Place 2 sprays into both nostrils 2 (two) times daily., Disp: 30 mL, Rfl: 5 .  buPROPion (WELLBUTRIN XL) 150 MG 24 hr tablet, Take 3 tablets (450 mg total) by mouth daily., Disp: 90 tablet, Rfl: 5 .  carvedilol (COREG) 25 MG tablet, TAKE 1 TABLET (25 MG TOTAL) BY MOUTH TWICE A DAY WITH MEALS, Disp: 180 tablet,  Rfl: 3 .  DULoxetine (CYMBALTA) 30 MG capsule, Take 1 capsule (30 mg total) by mouth daily., Disp: 30 capsule, Rfl: 5 .  DULoxetine (CYMBALTA) 60 MG capsule, Take 1 capsule (60 mg total) by mouth daily., Disp: 30 capsule, Rfl: 5 .  empagliflozin (JARDIANCE) 10 MG TABS tablet, TAKE ONE TABLET BY MOUTH DAILY BEFORE BREAKFAST, Disp: 90 tablet, Rfl: 3 .  Fluticasone-Salmeterol 232-14 MCG/ACT AEPB, Inhale 1 puff into the lungs in the morning and at bedtime., Disp: 1 each, Rfl: 5 .  furosemide (LASIX) 40 MG tablet, Take 1 tablet (40 mg total) by mouth daily. You may take 1 extra tablet (40 mg total) by mouth daily as needed for lower extremity swelling/weight gain., Disp: 105 tablet, Rfl: 2 .  levocetirizine (XYZAL) 5 MG tablet, TAKE 1 TABLET BY MOUTH EVERY DAY IN THE EVENING, Disp: 90 tablet, Rfl: 0 .  levonorgestrel (MIRENA, 52 MG,) 20 MCG/DAY IUD, , Disp: , Rfl:  .  lisdexamfetamine (VYVANSE) 50 MG capsule, Take 1 capsule (50 mg total) by mouth daily., Disp: 30 capsule, Rfl: 0 .  meloxicam (MOBIC) 15 MG tablet, TAKE 1 TABLET BY MOUTH EVERY DAY AS NEEDED FOR PAIN, Disp: 30 tablet, Rfl: 0 .  montelukast (SINGULAIR) 10 MG tablet, TAKE 1 TABLET BY MOUTH EVERYDAY AT BEDTIME, Disp: 90 tablet, Rfl: 2 .  omeprazole (PRILOSEC) 40 MG capsule, TAKE 1 CAPSULE BY MOUTH EVERY DAY (Patient not taking: Reported on 03/24/2022), Disp: 90 capsule, Rfl: 1 .  promethazine-dextromethorphan (PROMETHAZINE-DM) 6.25-15 MG/5ML syrup, Take 5 mLs by mouth at bedtime as needed for cough., Disp: 118 mL, Rfl: 0 .  sacubitril-valsartan (ENTRESTO) 97-103 MG, Take 1 tablet by mouth 2 (two) times daily., Disp: 60 tablet, Rfl: 11 .  spironolactone (ALDACTONE) 25 MG tablet, Take 1 tablet (25 mg total) by mouth daily., Disp: 90 tablet, Rfl: 3 .  traZODone (DESYREL) 50 MG tablet, Take 1 tablet (50 mg total) by mouth at bedtime., Disp: 30 tablet, Rfl: 5 .  Vitamin D, Ergocalciferol, (DRISDOL) 1.25 MG (50000 UNIT) CAPS capsule, Take 50,000  Units by mouth once a week., Disp: , Rfl:    Allergies  Allergen Reactions  . Grass Extracts [Gramineae Pollens]     Nasal congestion, post nasal drip   . Shellfish Allergy Itching    In throat only.     Review of Systems   There were no vitals filed for this visit. There is no height or weight on file to calculate BMI.  Wt Readings from Last 3 Encounters:  04/19/22 (!) 320 lb (145.2 kg)  03/24/22 (!) 316 lb 9.6 oz (143.6 kg)  02/25/22 (!) 323 lb 6.6 oz (146.7 kg)    The ASCVD Risk score (Arnett DK, et al., 2019) failed to calculate for the following reasons:   Cannot find a previous HDL lab   Cannot find a previous total cholesterol lab  Objective:  Physical Exam      Assessment And Plan:  Malignant hypertension with systolic CHF, NYHA class 2 (HCC)  Abnormal glucose    No follow-ups on file.  Patient was given opportunity to ask questions. Patient verbalized understanding of the plan and was able to repeat key elements of the plan. All questions were answered to their satisfaction.    Jeanell Sparrow, FNP, have reviewed all documentation for this visit. The documentation on 02/08/23 for the exam, diagnosis, procedures, and orders are all accurate and complete.   IF YOU HAVE BEEN REFERRED TO A SPECIALIST, IT MAY TAKE 1-2 WEEKS TO SCHEDULE/PROCESS THE REFERRAL. IF YOU HAVE NOT HEARD FROM US/SPECIALIST IN TWO WEEKS, PLEASE GIVE [place] A CALL AT [phone].

## 2023-02-09 ENCOUNTER — Other Ambulatory Visit: Payer: Self-pay | Admitting: Nurse Practitioner

## 2023-02-09 ENCOUNTER — Ambulatory Visit: Payer: Medicaid Other | Admitting: Nurse Practitioner

## 2023-02-09 DIAGNOSIS — I110 Hypertensive heart disease with heart failure: Secondary | ICD-10-CM

## 2023-02-09 DIAGNOSIS — Z748 Other problems related to care provider dependency: Secondary | ICD-10-CM

## 2023-02-09 DIAGNOSIS — R7309 Other abnormal glucose: Secondary | ICD-10-CM

## 2023-02-15 ENCOUNTER — Ambulatory Visit: Payer: Medicaid Other | Admitting: Nurse Practitioner

## 2023-03-04 ENCOUNTER — Other Ambulatory Visit: Payer: Self-pay | Admitting: Adult Health

## 2023-03-04 ENCOUNTER — Telehealth: Payer: Self-pay | Admitting: Adult Health

## 2023-03-04 DIAGNOSIS — F411 Generalized anxiety disorder: Secondary | ICD-10-CM

## 2023-03-04 DIAGNOSIS — F909 Attention-deficit hyperactivity disorder, unspecified type: Secondary | ICD-10-CM

## 2023-03-04 MED ORDER — LISDEXAMFETAMINE DIMESYLATE 50 MG PO CAPS: 50.0000 mg | ORAL_CAPSULE | Freq: Every day | ORAL | 0 refills | Status: DC

## 2023-03-04 NOTE — Telephone Encounter (Signed)
Please call to schedule FU, due this month.

## 2023-03-04 NOTE — Telephone Encounter (Signed)
Pt is also requesting RF on Vyvanse. [facility]: [id number] - [place], [place] - [address] AT [address]  [address], [place]

## 2023-03-04 NOTE — Telephone Encounter (Signed)
LF 11/1. Called patient to verify dose. Note says 40 mg, but LF was for 50 mg. She said it had been increased. Pended [facility]se to [facility].

## 2023-03-31 ENCOUNTER — Telehealth: Payer: Self-pay | Admitting: Adult Health

## 2023-03-31 NOTE — Telephone Encounter (Signed)
 Pt lvm that she is having issues with the wellbutrin. Her body is expelling it. She would like to know if she can have the medicine in a liquid form. Please call her at [phone]

## 2023-03-31 NOTE — Telephone Encounter (Signed)
 Samantha Romero states that Wellbutrtin 150 XL  has been coming out in her stool, the full pill along with the casing. She says that about 40 minutes after taking the medication she has a BM, and the medication is visible. She is currently taking Wellbutrin  150 xl. She denies any other SE. She's been on the medication for over a year, but just recently started to take notice a couple weeks ago. She would like to know if there is a liquid form that she can take?  Please advise.

## 2023-04-01 ENCOUNTER — Other Ambulatory Visit: Payer: Self-pay | Admitting: Adult Health

## 2023-04-01 DIAGNOSIS — F411 Generalized anxiety disorder: Secondary | ICD-10-CM

## 2023-04-01 NOTE — Telephone Encounter (Signed)
 Please schedule pt fu visit  lv 11/1 due back in 4 weeks.

## 2023-04-01 NOTE — Telephone Encounter (Signed)
 Please schedule pt fu, lv 11/1 due back in 4 weeks.

## 2023-04-01 NOTE — Telephone Encounter (Signed)
 Lf 12/6 lv 11/1 nv 1/7 filled for qty 8.

## 2023-04-01 NOTE — Telephone Encounter (Signed)
Pt is scheduled for 04/05/23

## 2023-04-04 NOTE — Telephone Encounter (Signed)
Has appt 1/7

## 2023-04-05 ENCOUNTER — Ambulatory Visit: Payer: Medicaid Other | Admitting: Adult Health

## 2023-04-05 ENCOUNTER — Encounter: Payer: Self-pay | Admitting: Adult Health

## 2023-04-05 DIAGNOSIS — G4700 Insomnia, unspecified: Secondary | ICD-10-CM | POA: Diagnosis not present

## 2023-04-05 DIAGNOSIS — F411 Generalized anxiety disorder: Secondary | ICD-10-CM | POA: Diagnosis not present

## 2023-04-05 DIAGNOSIS — F332 Major depressive disorder, recurrent severe without psychotic features: Secondary | ICD-10-CM

## 2023-04-05 DIAGNOSIS — F909 Attention-deficit hyperactivity disorder, unspecified type: Secondary | ICD-10-CM | POA: Diagnosis not present

## 2023-04-05 MED ORDER — ALPRAZOLAM 1 MG PO TABS: 1.0000 mg | ORAL_TABLET | Freq: Two times a day (BID) | ORAL | 2 refills | Status: DC | PRN

## 2023-04-05 NOTE — Progress Notes (Signed)
 Samantha Romero [id number] Jul 28, 1975 48 y.o.  Subjective:   Patient ID:  Samantha Romero is a 48 y.o. (DOB June 20, 1975) female.  Chief Complaint: No chief complaint on file.   HPI Samantha Romero presents to the office today for follow-up of MDD, GAD, ADHD, and insomnia.  Describes mood today as not too good. Pleasant. Tearful at times. Mood symptoms - reports depression and anxiety. Reports decreased irritability, but is very sensitive. Reports panic attacks. Reports social anxiety. Reports worry, rumination, and over thinking. Reports some obsessive thoughts and acts. Reports mood as lower - getting a little better. Stating I'm trying to open up more and be more vunerable. Reports varying interest and motivation. Reports taking medications consistently and feel like they are helpful. Working with a paramedic. Energy levels lower. Active, does not have a regular exercise routine.   Enjoys some usual interests and activities. Living with mother. Involved in a long term relationship. Has family local - mother, brother, and extended family. Spending time with family.  Appetite adequate. Weight stable. Reports sleeping better some nights than others at night. Averages 4 to 5 hours. Reports focus and concentration improved - taking Vyvanse  50mg  daily. Completing tasks. Managing minimal aspects of household. Looking for a job. Denies SI or HI.  Denies AH or VH. Denies self harm. Denies substance use.  Previous medication trials: Topamax   PHQ2-9    Flowsheet Row Office Visit from 03/24/2022 in [facility] INTENSIVE CARDIAC REHAB ORIENT from 02/25/2022 in [facility] Office Visit from 01/06/2022 in [facility] Nutrition from 04/20/2021 in [facility] Nutr Diab Ed  - A Dept Of [place]. [facility] Office Visit from 01/26/2021 in [facility]  PHQ-2 Total Score 3 5 1  0 0  PHQ-9 Total Score 16 19 10  -- 14      Flowsheet Row ED from 04/14/2022 in [facility]) ED from 10/08/2020 in [facility] Emergency Department at [facility]  C-SSRS RISK CATEGORY No Risk No Risk        Review of Systems:  Review of Systems  Musculoskeletal:  Negative for gait problem.  Neurological:  Negative for tremors.  Psychiatric/Behavioral:         Please refer to HPI    Medications: I have reviewed the patient's current medications.  Current Outpatient Medications  Medication Sig Dispense Refill   albuterol  (PROVENTIL ) (2.5 MG/3ML) 0.083% nebulizer solution Take 3 mLs (2.5 mg total) by nebulization every 6 (six) hours as needed for wheezing or shortness of breath. 150 mL 1   albuterol  (VENTOLIN  HFA) 108 (90 Base) MCG/ACT inhaler Inhale 2 puffs into the lungs every 6 (six) hours as needed for wheezing or shortness of breath. 8 g 2   ALPRAZolam  (XANAX ) 1 MG tablet Take 1 tablet (1 mg total) by mouth 2 (two) times daily as needed for anxiety. 60 tablet 2   azelastine  (ASTELIN ) 0.1 % nasal spray Place 2 sprays into both nostrils 2 (two) times daily. 30 mL 5   buPROPion  (WELLBUTRIN  XL) 150 MG 24 hr tablet Take 3 tablets (450 mg total) by mouth daily. 90 tablet 5   carvedilol  (COREG ) 25 MG tablet TAKE 1 TABLET (25 MG TOTAL) BY MOUTH TWICE A DAY WITH MEALS 180 tablet 3   DULoxetine  (CYMBALTA ) 30 MG capsule Take 1 capsule (30 mg total)  by mouth daily. 30 capsule 5   DULoxetine  (CYMBALTA ) 60 MG capsule Take 1 capsule (60 mg total) by mouth daily. 30 capsule 5   empagliflozin  (JARDIANCE ) 10 MG TABS tablet TAKE ONE TABLET BY MOUTH DAILY BEFORE BREAKFAST 90 tablet 3   Fluticasone -Salmeterol 232-14 MCG/ACT AEPB Inhale 1 puff into the lungs in the morning and at bedtime. 1 each 5   furosemide  (LASIX ) 40 MG tablet Take 1 tablet (40 mg total) by mouth daily. You may take 1 extra tablet (40 mg  total) by mouth daily as needed for lower extremity swelling/weight gain. 105 tablet 2   levocetirizine (XYZAL ) 5 MG tablet TAKE 1 TABLET BY MOUTH EVERY DAY IN THE EVENING 90 tablet 0   levonorgestrel (MIRENA, 52 MG,) 20 MCG/DAY IUD      lisdexamfetamine (VYVANSE ) 50 MG capsule Take 1 capsule (50 mg total) by mouth daily. 30 capsule 0   meloxicam  (MOBIC ) 15 MG tablet TAKE 1 TABLET BY MOUTH EVERY DAY AS NEEDED FOR PAIN 30 tablet 0   montelukast  (SINGULAIR ) 10 MG tablet TAKE 1 TABLET BY MOUTH EVERYDAY AT BEDTIME 90 tablet 2   omeprazole  (PRILOSEC) 40 MG capsule TAKE 1 CAPSULE BY MOUTH EVERY DAY (Patient not taking: Reported on 03/24/2022) 90 capsule 1   promethazine -dextromethorphan (PROMETHAZINE -DM) 6.25-15 MG/5ML syrup Take 5 mLs by mouth at bedtime as needed for cough. 118 mL 0   sacubitril -valsartan  (ENTRESTO ) 97-103 MG Take 1 tablet by mouth 2 (two) times daily. 60 tablet 11   spironolactone  (ALDACTONE ) 25 MG tablet Take 1 tablet (25 mg total) by mouth daily. 90 tablet 3   traZODone  (DESYREL ) 50 MG tablet Take 1 tablet (50 mg total) by mouth at bedtime. 30 tablet 5   Vitamin D, Ergocalciferol, (DRISDOL) 1.25 MG (50000 UNIT) CAPS capsule Take 50,000 Units by mouth once a week.     No current facility-administered medications for this visit.    Medication Side Effects: None  Allergies:  Allergies  Allergen Reactions   Grass Extracts [Gramineae Pollens]     Nasal congestion, post nasal drip    Shellfish Allergy Itching    In throat only.    Past Medical History:  Diagnosis Date   Anxiety    panic attacks   Asthma    CHF (congestive heart failure), NYHA class II, acute, systolic (HCC) 10/06/2018   Depression    Fibromyalgia    Hypertension    no meds currently   No pertinent past medical history    Pneumonia    hx of    Sinus problem     Past Medical History, Surgical history, Social history, and Family history were reviewed and updated as appropriate.   Please see review  of systems for further details on the patient's review from today.   Objective:   Physical Exam:  There were no vitals taken for this visit.  Physical Exam Constitutional:      General: She is not in acute distress. Musculoskeletal:        General: No deformity.  Neurological:     Mental Status: She is alert and oriented to person, place, and time.     Coordination: Coordination normal.  Psychiatric:        Attention and Perception: Attention and perception normal. She does not perceive auditory or visual hallucinations.        Mood and Affect: Affect is not labile, blunt, angry or inappropriate.        Speech: Speech normal.  Behavior: Behavior normal.        Thought Content: Thought content normal. Thought content is not paranoid or delusional. Thought content does not include homicidal or suicidal ideation. Thought content does not include homicidal or suicidal plan.        Cognition and Memory: Cognition and memory normal.        Judgment: Judgment normal.     Comments: Insight intact     Lab Review:     Component Value Date/Time   NA 145 (H) 04/14/2022 1438   K 3.5 04/14/2022 1438   CL 106 04/14/2022 1438   CO2 24 04/14/2022 1438   GLUCOSE 84 04/14/2022 1438   GLUCOSE 93 10/08/2020 2012   BUN 9 04/14/2022 1438   CREATININE 0.72 04/14/2022 1438   CALCIUM 9.2 04/14/2022 1438   PROT 6.6 04/14/2022 1438   ALBUMIN 4.1 04/14/2022 1438   AST 32 04/14/2022 1438   ALT 62 (H) 04/14/2022 1438   ALKPHOS 135 (H) 04/14/2022 1438   BILITOT 0.8 04/14/2022 1438   GFRNONAA >60 10/08/2020 2012   GFRAA 115 05/08/2020 1654       Component Value Date/Time   WBC 6.7 04/14/2022 1438   WBC 8.2 10/08/2020 2012   RBC 4.61 04/14/2022 1438   RBC 4.89 10/08/2020 2012   HGB 12.8 04/14/2022 1438   HCT 40.3 04/14/2022 1438   PLT 272 04/14/2022 1438   MCV 87 04/14/2022 1438   MCH 27.8 04/14/2022 1438   MCH 27.6 10/08/2020 2012   MCHC 31.8 04/14/2022 1438   MCHC 30.8 10/08/2020  2012   RDW 12.9 04/14/2022 1438   LYMPHSABS 2.3 04/14/2022 1438   MONOABS 0.6 10/08/2020 2012   EOSABS 0.1 04/14/2022 1438   BASOSABS 0.0 04/14/2022 1438    No results found for: POCLITH, LITHIUM   No results found for: PHENYTOIN, PHENOBARB, VALPROATE, CBMZ   .res Assessment: Plan:    Plan:  Patient currently out of work and is totally disabled. Out of work 04/05/2022 through 07/05/2022.  Seeing therapist - Macario Blumenthal - [facility]  PDMP reviewed  Trazadone 50mg  at hs as needed   Cymbalta  60/30 daily Wellbutrin  XL 150mg  - 3 daily - stopped after deeing tablets in stool - dicussed the reason for this - patient plans to restart medication. Xanax  1mg  up to twice a day - usually taking it once to twice a week Vyvanse  40mg  daily  RTC 4 weeks  Patient advised to contact office with any questions, adverse effects, or acute worsening in signs and symptoms.   Discussed potential benefits, risk, and side effects of benzodiazepines to include potential risk of tolerance and dependence, as well as possible drowsiness. Advised patient not to drive if experiencing drowsiness and to take lowest possible effective dose to minimize risk of dependence and tolerance.   Discussed potential benefits, risks, and side effects of stimulants with patient to include increased heart rate, palpitations, insomnia, increased anxiety, increased irritability, or decreased appetite.  Instructed patient to contact office if experiencing any significant tolerability issues.   Diagnoses and all orders for this visit:  Major depressive disorder, recurrent episode, severe with anxious distress (HCC)  GAD (generalized anxiety disorder) -     ALPRAZolam  (XANAX ) 1 MG tablet; Take 1 tablet (1 mg total) by mouth 2 (two) times daily as needed for anxiety.  Insomnia, unspecified type  Attention deficit hyperactivity disorder (ADHD), unspecified ADHD type     Please see After Visit Summary for  patient specific instructions.  No  future appointments.   No orders of the defined types were placed in this encounter.   -------------------------------

## 2023-04-28 ENCOUNTER — Other Ambulatory Visit: Payer: Self-pay | Admitting: *Deleted

## 2023-04-28 ENCOUNTER — Encounter: Payer: Self-pay | Admitting: Cardiology

## 2023-04-28 ENCOUNTER — Ambulatory Visit: Payer: Medicaid Other | Attending: Cardiology | Admitting: Cardiology

## 2023-04-28 VITALS — BP 118/82 | HR 97 | Ht 62.0 in | Wt 302.8 lb

## 2023-04-28 DIAGNOSIS — I5022 Chronic systolic (congestive) heart failure: Secondary | ICD-10-CM

## 2023-04-28 DIAGNOSIS — Z79899 Other long term (current) drug therapy: Secondary | ICD-10-CM

## 2023-04-28 DIAGNOSIS — I10 Essential (primary) hypertension: Secondary | ICD-10-CM | POA: Diagnosis not present

## 2023-04-28 MED ORDER — FUROSEMIDE 40 MG PO TABS: 40.0000 mg | ORAL_TABLET | Freq: Every day | ORAL | 2 refills | Status: DC

## 2023-04-28 MED ORDER — EMPAGLIFLOZIN 10 MG PO TABS: ORAL_TABLET | ORAL | 3 refills | Status: DC

## 2023-04-28 MED ORDER — SPIRONOLACTONE 25 MG PO TABS: 25.0000 mg | ORAL_TABLET | Freq: Every day | ORAL | 3 refills | Status: DC

## 2023-04-28 MED ORDER — ENTRESTO 97-103 MG PO TABS: 1.0000 | ORAL_TABLET | Freq: Two times a day (BID) | ORAL | 11 refills | Status: DC

## 2023-04-28 MED ORDER — CARVEDILOL 25 MG PO TABS: ORAL_TABLET | ORAL | 3 refills | Status: DC

## 2023-04-28 NOTE — Patient Instructions (Addendum)
Medication Instructions:  The current medical regimen is effective;  continue present plan and medications.  *If you need a refill on your cardiac medications before your next appointment, please call your pharmacy*  Lab Work: Please have blood work in 2 weeks (BMP) at your closest [facility].  If you have labs (blood work) drawn today and your tests are completely normal, you will receive your results only by: MyChart Message (if you have MyChart) OR A paper copy in the mail If you have any lab test that is abnormal or we need to change your treatment, we will call you to review the results.  Follow-Up: At [facility], you and your health needs are our priority.  As part of our continuing mission to provide you with exceptional heart care, we have created designated Provider Care Teams.  These Care Teams include your primary Cardiologist (physician) and Advanced Practice Providers (APPs -  Physician Assistants and Nurse Practitioners) who all work together to provide you with the care you need, when you need it.  We recommend signing up for the patient portal called "MyChart".  Sign up information is provided on this After Visit Summary.  MyChart is used to connect with patients for Virtual Visits (Telemedicine).  Patients are able to view lab/test results, encounter notes, upcoming appointments, etc.  Non-urgent messages can be sent to your provider as well.   To learn more about what you can do with MyChart, go to [email or web address].    Your next appointment:   2 - 3 month(s)  Provider:   Robin Searing, NP           You have been referred to Cardiac [facility] and will be contacted to be scheduled.

## 2023-04-28 NOTE — Progress Notes (Signed)
Cardiology Office Note:  .   Date:  04/28/2023  ID:  Samantha Romero, DOB [birth year], MRN [id number] PCP: [name], FNP  [facility] Providers Cardiologist:  Lesleigh Noe, MD (Inactive) Sleep Medicine:  Armanda Magic, MD    History of Present Illness: .   Samantha Romero is a 48 y.o. female Discussed with the use of AI scribe   History of Present Illness   The patient is a 48 year old female with chronic systolic heart failure who presents for follow-up.  She has chronic systolic heart failure secondary to non-ischemic cardiomyopathy, with an ejection fraction that was initially as low as 30% 2021 but has improved to 65% 2023. This improvement is attributed to medication adherence and lifestyle changes, including weight loss and the use of CPAP for obstructive sleep apnea. She has experienced breaches in medication compliance in the past.  She is currently taking carvedilol 25 mg twice daily, although she is inconsistent with her medication regimen. She has been unable to obtain Jardiance 10 mg due to insurance issues, despite it being prescribed for her heart condition. She also takes Lasix 40 mg daily, Entresto 97/103 mg twice daily, and spironolactone 25 mg daily.  She experiences significant stress, leading to increased anxiety and panic attacks, which she describes as feeling like 'the worst'. She is concerned about her heart health and is afraid to exercise independently due to fear of overexertion.  She has a history of obstructive sleep apnea, which has improved with weight loss and CPAP therapy.            Studies Reviewed: Marland Kitchen   EKG Interpretation Date/Time:  Thursday April 28 2023 13:30:35 EST Ventricular Rate:  94 PR Interval:  152 QRS Duration:  100 QT Interval:  388 QTC Calculation: 485 R Axis:   -5  Text Interpretation: Normal sinus rhythm Poor R wave progression When compared with ECG of 14-Apr-2022 15:15, No significant change was found Confirmed  by Donato Schultz ([id number]) on 04/28/2023 1:37:01 PM    Results   DIAGNOSTIC Echocardiogram: Ejection fraction 65% most recent Hg A1c - 5.9 Hgb 13.5 Creat 0.7     Risk Assessment/Calculations:            Physical Exam:   VS:  BP 118/82   Pulse 97   Ht 5\' 2"  (1.575 m)   Wt (!) 302 lb 12.8 oz (137.3 kg)   SpO2 94%   BMI 55.38 kg/m    Wt Readings from Last 3 Encounters:  04/28/23 (!) 302 lb 12.8 oz (137.3 kg)  04/19/22 (!) 320 lb (145.2 kg)  03/24/22 (!) 316 lb 9.6 oz (143.6 kg)    GEN: Well nourished, well developed in no acute distress NECK: No JVD; No carotid bruits CARDIAC: RRR, no murmurs, no rubs, no gallops RESPIRATORY:  Clear to auscultation without rales, wheezing or rhonchi  ABDOMEN: Soft, non-tender, non-distended EXTREMITIES:  No edema; No deformity   ASSESSMENT AND PLAN: .    Assessment and Plan    Chronic Systolic Heart Failure Chronic systolic heart failure secondary to non-ischemic cardiomyopathy, previously with an ejection fraction of 30%, now improved to 65% with medication and lifestyle changes. Comorbidities include morbid obesity, obstructive sleep apnea, and medication non-compliance. Reports significant stress and anxiety impacting condition. Inconsistent with medications, including carvedilol and Jardiance, the latter not approved by insurance. Discussed benefits of cardiac rehab to safely increase physical activity and reduce fear of overexertion. Emphasized importance of medication adherence to maintain  heart function and prevent deterioration. - Refill carvedilol 25 mg twice daily - Refill Jardiance 10 mg daily - Refill Lasix 40 mg daily - Refill Entresto 97/103 mg twice daily - Refill spironolactone 25 mg daily - Order basic metabolic panel (BMP) in two weeks - Refer to cardiac rehab program - Schedule follow-up appointment in two to three months  Obstructive Sleep Apnea Obstructive sleep apnea managed with CPAP. Breathing improved with weight  loss and diuretic therapy.  Anxiety and Depression Anxiety and depression with reported panic attacks. Significant stress exacerbating heart condition and overall well-being. Discussed impact of stress on heart health and importance of managing anxiety to improve outcomes.  General Health Maintenance Encouraged lifestyle modifications, including the Mediterranean diet, to support heart health. Recommended gradual increase in physical activity, aiming for 30 minutes a day. - Recommend Mediterranean diet - Encourage gradual increase in physical activity, aiming for 30 minutes a day.          Cardiac Rehabilitation Eligibility Assessment        Dispo: 3 months Antionette Fairy, Donato Schultz, MD

## 2023-05-13 ENCOUNTER — Telehealth (HOSPITAL_COMMUNITY): Payer: Self-pay

## 2023-05-13 NOTE — Telephone Encounter (Signed)
Pt insurance is active and benefits verified through [facility]. Co-pay $0.00, DED $0.00/$0.00 met, out of pocket $0.00/$0.00 met, co-insurance 0%. No pre-authorization required. Gissel G./[employer], 05/13/23 @ 12:23PM, [phone]   How many CR sessions are covered? TRC ONLY 36 Is this a lifetime maximum or an annual maximum? Annual Has the member used any of these services to date? No Is there a time limit (weeks/months) on start of program and/or program completion? No

## 2023-05-13 NOTE — Telephone Encounter (Signed)
Called patient to see if she was interested in participating in the Cardiac Rehab Program. Patient will come in for orientation on 05/17/23 @ 1:15PM and will attend the 1:45PM exercise class.   Pensions consultant.

## 2023-05-16 ENCOUNTER — Telehealth (HOSPITAL_COMMUNITY): Payer: Self-pay

## 2023-05-16 NOTE — Telephone Encounter (Signed)
Pt called and stated she would need to reschedule her CR orientation due to transportation. Patient will come in for orientation on 05/24/23 @ 1:15PM and will attend the 1:15PM exercise class.   Pensions consultant.

## 2023-05-17 ENCOUNTER — Ambulatory Visit (HOSPITAL_COMMUNITY): Payer: Medicaid Other

## 2023-05-19 ENCOUNTER — Other Ambulatory Visit: Payer: Self-pay

## 2023-05-19 ENCOUNTER — Telehealth: Payer: Self-pay | Admitting: Adult Health

## 2023-05-19 DIAGNOSIS — F411 Generalized anxiety disorder: Secondary | ICD-10-CM

## 2023-05-19 DIAGNOSIS — F909 Attention-deficit hyperactivity disorder, unspecified type: Secondary | ICD-10-CM

## 2023-05-19 DIAGNOSIS — F332 Major depressive disorder, recurrent severe without psychotic features: Secondary | ICD-10-CM

## 2023-05-19 MED ORDER — LISDEXAMFETAMINE DIMESYLATE 50 MG PO CAPS: 50.0000 mg | ORAL_CAPSULE | Freq: Every day | ORAL | 0 refills | Status: DC

## 2023-05-19 MED ORDER — DULOXETINE HCL 30 MG PO CPEP
30.0000 mg | ORAL_CAPSULE | Freq: Every day | ORAL | 0 refills | Status: DC
Start: 2023-05-19 — End: 2023-06-07

## 2023-05-19 MED ORDER — DULOXETINE HCL 60 MG PO CPEP: 60.0000 mg | ORAL_CAPSULE | Freq: Every day | ORAL | 0 refills | Status: DC

## 2023-05-19 NOTE — Telephone Encounter (Signed)
She is requesting refill of Buproprion, Cymbalta and Vyvanse to   [id number] - [place], [place] - [address]OSS [address], [place] [address] Phone: [phone]  Fax: [phone]    No upcoming appt scheduled.

## 2023-05-19 NOTE — Telephone Encounter (Signed)
Pt is scheduled in march

## 2023-05-19 NOTE — Telephone Encounter (Signed)
Pt called at 10:30a

## 2023-05-23 ENCOUNTER — Ambulatory Visit (HOSPITAL_COMMUNITY): Payer: Medicaid Other

## 2023-05-23 ENCOUNTER — Telehealth: Payer: Self-pay | Admitting: Adult Health

## 2023-05-23 MED ORDER — BUPROPION HCL ER (XL) 150 MG PO TB24: 450.0000 mg | ORAL_TABLET | Freq: Every day | ORAL | 5 refills | Status: DC

## 2023-05-23 NOTE — Telephone Encounter (Signed)
 Sent RF for bupropion 150 mg TID to [facility] [place].

## 2023-05-23 NOTE — Telephone Encounter (Signed)
 Samantha Romero called and LM at 1:16 to request refill of her Bupropion.  Her other medications were sent in but not the Bupropion.  Please send that one too  appt 3/11.  Send to   [id number] - [place], [place] - [address] AT [address] FROM [address]

## 2023-05-24 ENCOUNTER — Encounter (HOSPITAL_COMMUNITY): Payer: Self-pay

## 2023-05-24 ENCOUNTER — Encounter (HOSPITAL_COMMUNITY)
Admission: RE | Admit: 2023-05-24 | Discharge: 2023-05-24 | Disposition: A | Discharge status: Home / Self Care | Payer: Medicaid Other | Source: Ambulatory Visit | Attending: Cardiology | Admitting: Cardiology

## 2023-05-24 VITALS — BP 128/78 | HR 105 | Ht 62.75 in | Wt 313.3 lb

## 2023-05-24 DIAGNOSIS — I5022 Chronic systolic (congestive) heart failure: Secondary | ICD-10-CM | POA: Diagnosis present

## 2023-05-24 NOTE — Progress Notes (Signed)
 Cardiac Rehab Medication Review by a Nurse  Does the patient  feel that his/her medications are working for him/her?  yes  Has the patient been experiencing any side effects to the medications prescribed?  no  Does the patient measure his/her own blood pressure or blood glucose at home?  no   Does the patient have any problems obtaining medications due to transportation or finances?   no  Understanding of regimen: good Understanding of indications: good Potential of compliance: fair    Nurse  comments: Joleene says that she had issues with getting her antidepressants filled at the beginning of January. Lauriana says that she is now getting her medications as prescribed. Lashai is now more complaint with her medications now. Kristene has a BP cuff/ monitor. Kimberlee does not check her blood pressure on a regular basis.    Thayer Headings RN 05/24/2023 2:37 PM

## 2023-05-24 NOTE — Progress Notes (Signed)
 Resting Sinus tachycardia noted rates low 100's. Medications reviewed taking as prescribed. Max exertional BP noted at 172/82. Max heart rate 140.  Exit blood pressure 158/78. Will forward today's vital signs and ECG tracings to Dr Anne Fu for review. Patient is noted to be deconditioned.Will continue to monitor the patient throughout  the program.Elvia Aydin Mila Palmer RN BSN

## 2023-05-24 NOTE — Progress Notes (Signed)
 Samantha Romero is interested in finding a primary care provider closer to her home in [place]. Appointment obtained to see Ria Clock NP on 06/17/23 at [facility].Thayer Headings RN BSN

## 2023-05-24 NOTE — Progress Notes (Signed)
 Cardiac Individual Treatment Plan  Patient Details  Name: Samantha Romero MRN: [id number] Date of Birth: [birth year] Referring Provider:   Flowsheet Row CARDIAC REHAB PHASE II ORIENTATION from 05/24/2023 in [facility]  Referring Provider Jake Bathe, MD       Initial Encounter Date:  Flowsheet Row CARDIAC REHAB PHASE II ORIENTATION from 05/24/2023 in [facility]  Date 05/24/23       Visit Diagnosis: Heart failure, chronic systolic (HCC)  Patient's Home Medications on Admission:  Current Outpatient Medications:    albuterol (PROVENTIL) (2.5 MG/3ML) 0.083% nebulizer solution, Take 3 mLs (2.5 mg total) by nebulization every 6 (six) hours as needed for wheezing or shortness of breath., Disp: 150 mL, Rfl: 1   albuterol (VENTOLIN HFA) 108 (90 Base) MCG/ACT inhaler, Inhale 2 puffs into the lungs every 6 (six) hours as needed for wheezing or shortness of breath., Disp: 8 g, Rfl: 2   ALPRAZolam (XANAX) 1 MG tablet, Take 1 tablet (1 mg total) by mouth 2 (two) times daily as needed for anxiety., Disp: 60 tablet, Rfl: 2   buPROPion (WELLBUTRIN XL) 150 MG 24 hr tablet, Take 3 tablets (450 mg total) by mouth daily., Disp: 90 tablet, Rfl: 5   carvedilol (COREG) 25 MG tablet, TAKE 1 TABLET (25 MG TOTAL) BY MOUTH TWICE A DAY WITH MEALS, Disp: 180 tablet, Rfl: 3   DULoxetine (CYMBALTA) 30 MG capsule, Take 1 capsule (30 mg total) by mouth daily., Disp: 30 capsule, Rfl: 0   DULoxetine (CYMBALTA) 60 MG capsule, Take 1 capsule (60 mg total) by mouth daily., Disp: 30 capsule, Rfl: 0   empagliflozin (JARDIANCE) 10 MG TABS tablet, TAKE ONE TABLET BY MOUTH DAILY BEFORE BREAKFAST, Disp: 90 tablet, Rfl: 3   furosemide (LASIX) 40 MG tablet, Take 1 tablet (40 mg total) by mouth daily. You may take 1 extra tablet (40 mg total) by mouth daily as needed for lower extremity swelling/weight gain., Disp:  105 tablet, Rfl: 2   levocetirizine (XYZAL) 5 MG tablet, TAKE 1 TABLET BY MOUTH EVERY DAY IN THE EVENING, Disp: 90 tablet, Rfl: 0   levonorgestrel (MIRENA, 52 MG,) 20 MCG/DAY IUD, , Disp: , Rfl:    lisdexamfetamine (VYVANSE) 50 MG capsule, Take 1 capsule (50 mg total) by mouth daily., Disp: 30 capsule, Rfl: 0   montelukast (SINGULAIR) 10 MG tablet, TAKE 1 TABLET BY MOUTH EVERYDAY AT BEDTIME, Disp: 90 tablet, Rfl: 2   sacubitril-valsartan (ENTRESTO) 97-103 MG, Take 1 tablet by mouth 2 (two) times daily., Disp: 60 tablet, Rfl: 11   spironolactone (ALDACTONE) 25 MG tablet, Take 1 tablet (25 mg total) by mouth daily., Disp: 90 tablet, Rfl: 3   traZODone (DESYREL) 50 MG tablet, Take 1 tablet (50 mg total) by mouth at bedtime., Disp: 30 tablet, Rfl: 5   azelastine (ASTELIN) 0.1 % nasal spray, Place 2 sprays into both nostrils 2 (two) times daily. (Patient not taking: Reported on 04/28/2023), Disp: 30 mL, Rfl: 5   Fluticasone-Salmeterol 232-14 MCG/ACT AEPB, Inhale 1 puff into the lungs in the morning and at bedtime. (Patient not taking: Reported on 05/24/2023), Disp: 1 each, Rfl: 5   meloxicam (MOBIC) 15 MG tablet, TAKE 1 TABLET BY MOUTH EVERY DAY AS NEEDED FOR PAIN (Patient not taking: Reported on 05/24/2023), Disp: 30 tablet, Rfl: 0   omeprazole (PRILOSEC) 40 MG capsule, TAKE 1 CAPSULE BY MOUTH EVERY DAY (Patient not taking:  Reported on 03/24/2022), Disp: 90 capsule, Rfl: 1   Vitamin D, Ergocalciferol, (DRISDOL) 1.25 MG (50000 UNIT) CAPS capsule, Take 50,000 Units by mouth once a week. (Patient not taking: Reported on 05/24/2023), Disp: , Rfl:   Past Medical History: Past Medical History:  Diagnosis Date   Anxiety    panic attacks   Asthma    CHF (congestive heart failure), NYHA class II, acute, systolic (HCC) 10/06/2018   Depression    Fibromyalgia    Hypertension    no meds currently   No pertinent past medical history    Pneumonia    hx of    Sinus problem     Tobacco Use: Social History    Tobacco Use  Smoking Status Never  Smokeless Tobacco Never    Labs: Review Flowsheet       Latest Ref Rng & Units 08/17/2019 11/19/2019 05/08/2020 01/06/2022  Labs for ITP Cardiac and Pulmonary Rehab  Cholestrol 100 - 199 mg/dL 161  096  - -  LDL (calc) 0 - 99 mg/dL 82  91  - -  HDL-C >04 mg/dL 53  50  - -  Trlycerides 0 - 149 mg/dL 540  93  - -  Hemoglobin A1c 4.8 - 5.6 % 6.3  - 6.0  5.9     Capillary Blood Glucose: No results found for: "GLUCAP"   Exercise Target Goals: Exercise Program Goal: Individual exercise prescription set using results from initial 6 min walk test and THRR while considering  patient's activity barriers and safety.   Exercise Prescription Goal: Initial exercise prescription builds to 30-45 minutes a day of aerobic activity, 2-3 days per week.  Home exercise guidelines will be given to patient during program as part of exercise prescription that the participant will acknowledge.  Activity Barriers & Risk Stratification:  Activity Barriers & Cardiac Risk Stratification - 05/24/23 1404       Activity Barriers & Cardiac Risk Stratification   Activity Barriers Fibromyalgia;Deconditioning;Other (comment);Shortness of Breath;Assistive Device    Comments Left hip pain. She has a cane but not currently using.    Cardiac Risk Stratification Moderate             6 Minute Walk:  6 Minute Walk     Row Name 05/24/23 1513         6 Minute Walk   Phase Initial     Distance 900 feet     Walk Time 6 minutes     # of Rest Breaks 0     MPH 1.7     METS 2.8     RPE 12     Perceived Dyspnea  1     VO2 Peak 9.81     Symptoms Yes (comment)     Comments Mild shortness of breath.     Resting HR 105 bpm     Resting BP 128/78     Resting Oxygen Saturation  97 %     Exercise Oxygen Saturation  during 6 min walk 98 %     Max Ex. HR 140 bpm     Max Ex. BP 172/82     2 Minute Post BP 158/78              Oxygen Initial Assessment:   Oxygen  Re-Evaluation:   Oxygen Discharge (Final Oxygen Re-Evaluation):   Initial Exercise Prescription:  Initial Exercise Prescription - 05/24/23 1500       Date of Initial Exercise RX and Referring Provider  Date 05/24/23    Referring Provider Jake Bathe, MD    Expected Discharge Date 08/17/23      Treadmill   MPH 1.7    Grade 0    Minutes 10    METs 2.3      T5 Nustep   Level 1    SPM 85    Minutes 20    METs 2.3      Prescription Details   Frequency (times per week) 3    Duration Progress to 30 minutes of continuous aerobic without signs/symptoms of physical distress      Intensity   THRR 40-80% of Max Heartrate 70-139    Ratings of Perceived Exertion 11-13    Perceived Dyspnea 0-4      Progression   Progression Continue to progress workloads to maintain intensity without signs/symptoms of physical distress.      Resistance Training   Training Prescription Yes    Weight 2 lbs    Reps 10-15             Perform Capillary Blood Glucose checks as needed.  Exercise Prescription Changes:   Exercise Comments:   Exercise Goals and Review:   Exercise Goals     Row Name 05/24/23 1407             Exercise Goals   Increase Physical Activity Yes       Intervention Provide advice, education, support and counseling about physical activity/exercise needs.;Develop an individualized exercise prescription for aerobic and resistive training based on initial evaluation findings, risk stratification, comorbidities and participant's personal goals.       Expected Outcomes Short Term: Attend rehab on a regular basis to increase amount of physical activity.;Long Term: Exercising regularly at least 3-5 days a week.;Long Term: Add in home exercise to make exercise part of routine and to increase amount of physical activity.       Increase Strength and Stamina Yes       Intervention Provide advice, education, support and counseling about physical activity/exercise  needs.;Develop an individualized exercise prescription for aerobic and resistive training based on initial evaluation findings, risk stratification, comorbidities and participant's personal goals.       Expected Outcomes Short Term: Increase workloads from initial exercise prescription for resistance, speed, and METs.;Short Term: Perform resistance training exercises routinely during rehab and add in resistance training at home;Long Term: Improve cardiorespiratory fitness, muscular endurance and strength as measured by increased METs and functional capacity ( )       Able to understand and use rate of perceived exertion (RPE) scale Yes       Intervention Provide education and explanation on how to use RPE scale       Expected Outcomes Short Term: Able to use RPE daily in rehab to express subjective intensity level;Long Term:  Able to use RPE to guide intensity level when exercising independently       Knowledge and understanding of Target Heart Rate Range (THRR) Yes       Intervention Provide education and explanation of THRR including how the numbers were predicted and where they are located for reference       Expected Outcomes Short Term: Able to state/look up THRR;Long Term: Able to use THRR to govern intensity when exercising independently;Short Term: Able to use daily as guideline for intensity in rehab       Able to check pulse independently Yes       Intervention Provide education and demonstration on how  to check pulse in carotid and radial arteries.;Review the importance of being able to check your own pulse for safety during independent exercise       Expected Outcomes Short Term: Able to explain why pulse checking is important during independent exercise;Long Term: Able to check pulse independently and accurately       Understanding of Exercise Prescription Yes       Intervention Provide education, explanation, and written materials on patient's individual exercise prescription        Expected Outcomes Short Term: Able to explain program exercise prescription;Long Term: Able to explain home exercise prescription to exercise independently                Exercise Goals Re-Evaluation :   Discharge Exercise Prescription (Final Exercise Prescription Changes):   Nutrition:  Target Goals: Understanding of nutrition guidelines, daily intake of sodium 1500mg , cholesterol 200mg , calories 30% from fat and 7% or less from saturated fats, daily to have 5 or more servings of fruits and vegetables.  Biometrics:  Pre Biometrics - 05/24/23 1334       Pre Biometrics   Waist Circumference 61.5 inches    Hip Circumference 63 inches    Waist to Hip Ratio 0.98 %    Triceps Skinfold 45 mm    % Body Fat 65.7 %    Grip Strength 31 kg    Flexibility 11 in    Single Leg Stand 30 seconds              Nutrition Therapy Plan and Nutrition Goals:   Nutrition Assessments:  MEDIFICTS Score Key: >=70 Need to make dietary changes  40-70 Heart Healthy Diet <= 40 Therapeutic Level Cholesterol Diet    Picture Your Plate Scores: <40 Unhealthy dietary pattern with much room for improvement. 41-50 Dietary pattern unlikely to meet recommendations for good health and room for improvement. 51-60 More healthful dietary pattern, with some room for improvement.  >60 Healthy dietary pattern, although there may be some specific behaviors that could be improved.    Nutrition Goals Re-Evaluation:   Nutrition Goals Re-Evaluation:   Nutrition Goals Discharge (Final Nutrition Goals Re-Evaluation):   Psychosocial: Target Goals: Acknowledge presence or absence of significant depression and/or stress, maximize coping skills, provide positive support system. Participant is able to verbalize types and ability to use techniques and skills needed for reducing stress and depression.  Initial Review & Psychosocial Screening:  Initial Psych Review & Screening - 05/24/23 1512        Initial Review   Current issues with Current Depression;History of Depression;Current Anxiety/Panic;Current Psychotropic Meds;Current Sleep Concerns;Current Stress Concerns    Source of Stress Concerns Chronic Illness;Financial;Poor Coping Skills;Unable to participate in former interests or hobbies;Unable to perform yard/household activities;Occupation    Comments Janeice is currently depressed as she has been battling severe depression for the past several years. Deniss says the medications she is taking for anxiety and depression are being titrated. Twilla hopes that her anitdepressant will become more effective in managing her depression. Rinda has a Paramedic and a psychiatirst that she currently sees. Ione previously had issues with obtaining medication due to no income. Housing insufficiency. Saachi now has housing [employer].      Family Dynamics   Good Support System? Yes   Pallavi says that she has a good support network of friends and family in the area.   Comments Hargun would like to eventually change her therapy and counselling to a location closer to her home  in [facility] if possible.      Barriers   Psychosocial barriers to participate in program The patient should benefit from training in stress management and relaxation.      Screening Interventions   Interventions To provide support and resources with identified psychosocial needs;Provide feedback about the scores to participant;Encouraged to exercise    Expected Outcomes Short Term goal: Utilizing psychosocial counselor, staff and physician to assist with identification of specific Stressors or current issues interfering with healing process. Setting desired goal for each stressor or current issue identified.;Long Term Goal: Stressors or current issues are controlled or eliminated.;Short Term goal: Identification and review with participant of any Quality of Life or Depression concerns found by scoring the  questionnaire.;Long Term goal: The participant improves quality of Life and PHQ9 Scores as seen by post scores and/or verbalization of changes             Quality of Life Scores:  Quality of Life - 05/24/23 1439       Quality of Life   Select Quality of Life      Quality of Life Scores   Health/Function Pre 5.07 %    Socioeconomic Pre 6.63 %    Psych/Spiritual Pre 5.71 %    Family Pre 15.6 %    GLOBAL Pre 7.06 %            Scores of 19 and below usually indicate a poorer quality of life in these areas.  A difference of  2-3 points is a clinically meaningful difference.  A difference of 2-3 points in the total score of the Quality of Life Index has been associated with significant improvement in overall quality of life, self-image, physical symptoms, and general health in studies assessing change in quality of life.  PHQ-9: Review Flowsheet  More data exists      05/24/2023 04/22/2022 03/24/2022 02/25/2022 01/06/2022  Depression screen PHQ 2/9  Decreased Interest 3  1 2  0  Down, Depressed, Hopeless 3  2 3 1   PHQ - 2 Score 6  3 5 1   Altered sleeping 3  3 3 3   Tired, decreased energy 2  3 3 2   Change in appetite 2  1 3 1   Feeling bad or failure about yourself  1  3 0 0  Trouble concentrating 2  3 3 3   Moving slowly or fidgety/restless 0  0 2 0  Suicidal thoughts 0  0 0 0  PHQ-9 Score 16  16 19 10   Difficult doing work/chores Extremely dIfficult  Somewhat difficult Very difficult Extremely dIfficult    Details       Information is confidential and restricted. Go to Review Flowsheets to unlock data.        Interpretation of Total Score  Total Score Depression Severity:  1-4 = Minimal depression, 5-9 = Mild depression, 10-14 = Moderate depression, 15-19 = Moderately severe depression, 20-27 = Severe depression   Psychosocial Evaluation and Intervention:   Psychosocial Re-Evaluation:   Psychosocial Discharge (Final Psychosocial Re-Evaluation):   Vocational  Rehabilitation: Provide vocational rehab assistance to qualifying candidates.   Vocational Rehab Evaluation & Intervention:  Vocational Rehab - 05/24/23 1526       Initial Vocational Rehab Evaluation & Intervention   Assessment shows need for Vocational Rehabilitation Yes    Vocational Rehab Packet given to patient 05/24/23      Vocational Rehab Re-Evaulation   Comments Liane was given a vocational rehab packet to review and return on her next  scheduled exercise session. De             Education: Education Goals: Education classes will be provided on a weekly basis, covering required topics. Participant will state understanding/return demonstration of topics presented.     Core Videos: Exercise    Move It!  Clinical staff conducted group or individual video education with verbal and written material and guidebook.  Patient learns the recommended Pritikin exercise program. Exercise with the goal of living a long, healthy life. Some of the health benefits of exercise include controlled diabetes, healthier blood pressure levels, improved cholesterol levels, improved heart and lung capacity, improved sleep, and better body composition. Everyone should speak with their doctor before starting or changing an exercise routine.  Biomechanical Limitations Clinical staff conducted group or individual video education with verbal and written material and guidebook.  Patient learns how biomechanical limitations can impact exercise and how we can mitigate and possibly overcome limitations to have an impactful and balanced exercise routine.  Body Composition Clinical staff conducted group or individual video education with verbal and written material and guidebook.  Patient learns that body composition (ratio of muscle mass to fat mass) is a key component to assessing overall fitness, rather than body weight alone. Increased fat mass, especially visceral belly fat, can put [place] at increased risk  for metabolic syndrome, type 2 diabetes, heart disease, and even death. It is recommended to combine diet and exercise (cardiovascular and resistance training) to improve your body composition. Seek guidance from your physician and exercise physiologist before implementing an exercise routine.  Exercise Action Plan Clinical staff conducted group or individual video education with verbal and written material and guidebook.  Patient learns the recommended strategies to achieve and enjoy long-term exercise adherence, including variety, self-motivation, self-efficacy, and positive decision making. Benefits of exercise include fitness, good health, weight management, more energy, better sleep, less stress, and overall well-being.  Medical   Heart Disease Risk Reduction Clinical staff conducted group or individual video education with verbal and written material and guidebook.  Patient learns our heart is our most vital organ as it circulates oxygen, nutrients, white blood cells, and hormones throughout the entire body, and carries waste away. Data supports a plant-based eating plan like the Pritikin Program for its effectiveness in slowing progression of and reversing heart disease. The video provides a number of recommendations to address heart disease.   Metabolic Syndrome and Belly Fat  Clinical staff conducted group or individual video education with verbal and written material and guidebook.  Patient learns what metabolic syndrome is, how it leads to heart disease, and how one can reverse it and keep it from coming back. You have metabolic syndrome if you have 3 of the following 5 criteria: abdominal obesity, high blood pressure, high triglycerides, low HDL cholesterol, and high blood sugar.  Hypertension and Heart Disease Clinical staff conducted group or individual video education with verbal and written material and guidebook.  Patient learns that high blood pressure, or hypertension, is very  common in the [place]. Hypertension is largely due to excessive salt intake, but other important risk factors include being overweight, physical inactivity, drinking too much alcohol, smoking, and not eating enough potassium from fruits and vegetables. High blood pressure is a leading risk factor for heart attack, stroke, congestive heart failure, dementia, kidney failure, and premature death. Long-term effects of excessive salt intake include stiffening of the arteries and thickening of heart muscle and organ damage. Recommendations include ways to reduce hypertension  and the risk of heart disease.  Diseases of Our Time - Focusing on Diabetes Clinical staff conducted group or individual video education with verbal and written material and guidebook.  Patient learns why the best way to stop diseases of our time is prevention, through food and other lifestyle changes. Medicine (such as prescription pills and surgeries) is often only a Band-Aid on the problem, not a long-term solution. Most common diseases of our time include obesity, type 2 diabetes, hypertension, heart disease, and cancer. The Pritikin Program is recommended and has been proven to help reduce, reverse, and/or prevent the damaging effects of metabolic syndrome.  Nutrition   Overview of the Pritikin Eating Plan  Clinical staff conducted group or individual video education with verbal and written material and guidebook.  Patient learns about the Pritikin Eating Plan for disease risk reduction. The Pritikin Eating Plan emphasizes a wide variety of unrefined, minimally-processed carbohydrates, like fruits, vegetables, whole grains, and legumes. Go, Caution, and Stop food choices are explained. Plant-based and lean animal proteins are emphasized. Rationale provided for low sodium intake for blood pressure control, low added sugars for blood sugar stabilization, and low added fats and oils for coronary artery disease risk reduction and  weight management.  Calorie Density  Clinical staff conducted group or individual video education with verbal and written material and guidebook.  Patient learns about calorie density and how it impacts the Pritikin Eating Plan. Knowing the characteristics of the food you choose will help you decide whether those foods will lead to weight gain or weight loss, and whether you want to consume more or less of them. Weight loss is usually a side effect of the Pritikin Eating Plan because of its focus on low calorie-dense foods.  Label Reading  Clinical staff conducted group or individual video education with verbal and written material and guidebook.  Patient learns about the Pritikin recommended label reading guidelines and corresponding recommendations regarding calorie density, added sugars, sodium content, and whole grains.  Dining Out - Part 1  Clinical staff conducted group or individual video education with verbal and written material and guidebook.  Patient learns that restaurant meals can be sabotaging because they can be so high in calories, fat, sodium, and/or sugar. Patient learns recommended strategies on how to positively address this and avoid unhealthy pitfalls.  Facts on Fats  Clinical staff conducted group or individual video education with verbal and written material and guidebook.  Patient learns that lifestyle modifications can be just as effective, if not more so, as many medications for lowering your risk of heart disease. A Pritikin lifestyle can help to reduce your risk of inflammation and atherosclerosis (cholesterol build-up, or plaque, in the artery walls). Lifestyle interventions such as dietary choices and physical activity address the cause of atherosclerosis. A review of the types of fats and their impact on blood cholesterol levels, along with dietary recommendations to reduce fat intake is also included.  Nutrition Action Plan  Clinical staff conducted group or  individual video education with verbal and written material and guidebook.  Patient learns how to incorporate Pritikin recommendations into their lifestyle. Recommendations include planning and keeping personal health goals in mind as an important part of their success.  Healthy Mind-Set    Healthy Minds, Bodies, Hearts  Clinical staff conducted group or individual video education with verbal and written material and guidebook.  Patient learns how to identify when they are stressed. Video will discuss the impact of that stress, as well as the  many benefits of stress management. Patient will also be introduced to stress management techniques. The way we think, act, and feel has an impact on our hearts.  How Our Thoughts Can Heal Our Hearts  Clinical staff conducted group or individual video education with verbal and written material and guidebook.  Patient learns that negative thoughts can cause depression and anxiety. This can result in negative lifestyle behavior and serious health problems. Cognitive behavioral therapy is an effective method to help control our thoughts in order to change and improve our emotional outlook.  Additional Videos:  Exercise    Improving Performance  Clinical staff conducted group or individual video education with verbal and written material and guidebook.  Patient learns to use a non-linear approach by alternating intensity levels and lengths of time spent exercising to help burn more calories and lose more body fat. Cardiovascular exercise helps improve heart health, metabolism, hormonal balance, blood sugar control, and recovery from fatigue. Resistance training improves strength, endurance, balance, coordination, reaction time, metabolism, and muscle mass. Flexibility exercise improves circulation, posture, and balance. Seek guidance from your physician and exercise physiologist before implementing an exercise routine and learn your capabilities and proper form for  all exercise.  Introduction to Yoga  Clinical staff conducted group or individual video education with verbal and written material and guidebook.  Patient learns about yoga, a discipline of the coming together of mind, breath, and body. The benefits of yoga include improved flexibility, improved range of motion, better posture and core strength, increased lung function, weight loss, and positive self-image. Yoga's heart health benefits include lowered blood pressure, healthier heart rate, decreased cholesterol and triglyceride levels, improved immune function, and reduced stress. Seek guidance from your physician and exercise physiologist before implementing an exercise routine and learn your capabilities and proper form for all exercise.  Medical   Aging: Enhancing Your Quality of Life  Clinical staff conducted group or individual video education with verbal and written material and guidebook.  Patient learns key strategies and recommendations to stay in good physical health and enhance quality of life, such as prevention strategies, having an advocate, securing a Health Care Proxy and Power of Attorney, and keeping a list of medications and system for tracking them. It also discusses how to avoid risk for bone loss.  Biology of Weight Control  Clinical staff conducted group or individual video education with verbal and written material and guidebook.  Patient learns that weight gain occurs because we consume more calories than we burn (eating more, moving less). Even if your body weight is normal, you may have higher ratios of fat compared to muscle mass. Too much body fat puts you at increased risk for cardiovascular disease, heart attack, stroke, type 2 diabetes, and obesity-related cancers. In addition to exercise, following the Pritikin Eating Plan can help reduce your risk.  Decoding Lab Results  Clinical staff conducted group or individual video education with verbal and written material and  guidebook.  Patient learns that lab test reflects one measurement whose values change over time and are influenced by many factors, including medication, stress, sleep, exercise, food, hydration, pre-existing medical conditions, and more. It is recommended to use the knowledge from this video to become more involved with your lab results and evaluate your numbers to speak with your doctor.   Diseases of Our Time - Overview  Clinical staff conducted group or individual video education with verbal and written material and guidebook.  Patient learns that according to the CDC, 50%  to 70% of chronic diseases (such as obesity, type 2 diabetes, elevated lipids, hypertension, and heart disease) are avoidable through lifestyle improvements including healthier food choices, listening to satiety cues, and increased physical activity.  Sleep Disorders Clinical staff conducted group or individual video education with verbal and written material and guidebook.  Patient learns how good quality and duration of sleep are important to overall health and well-being. Patient also learns about sleep disorders and how they impact health along with recommendations to address them, including discussing with a physician.  Nutrition  Dining Out - Part 2 Clinical staff conducted group or individual video education with verbal and written material and guidebook.  Patient learns how to plan ahead and communicate in order to maximize their dining experience in a healthy and nutritious manner. Included are recommended food choices based on the type of restaurant the patient is visiting.   Fueling a Banker conducted group or individual video education with verbal and written material and guidebook.  There is a strong connection between our food choices and our health. Diseases like obesity and type 2 diabetes are very prevalent and are in large-part due to lifestyle choices. The Pritikin Eating Plan  provides plenty of food and hunger-curbing satisfaction. It is easy to follow, affordable, and helps reduce health risks.  Menu Workshop  Clinical staff conducted group or individual video education with verbal and written material and guidebook.  Patient learns that restaurant meals can sabotage health goals because they are often packed with calories, fat, sodium, and sugar. Recommendations include strategies to plan ahead and to communicate with the manager, chef, or server to help order a healthier meal.  Planning Your Eating Strategy  Clinical staff conducted group or individual video education with verbal and written material and guidebook.  Patient learns about the Pritikin Eating Plan and its benefit of reducing the risk of disease. The Pritikin Eating Plan does not focus on calories. Instead, it emphasizes high-quality, nutrient-rich foods. By knowing the characteristics of the foods, we choose, we can determine their calorie density and make informed decisions.  Targeting Your Nutrition Priorities  Clinical staff conducted group or individual video education with verbal and written material and guidebook.  Patient learns that lifestyle habits have a tremendous impact on disease risk and progression. This video provides eating and physical activity recommendations based on your personal health goals, such as reducing LDL cholesterol, losing weight, preventing or controlling type 2 diabetes, and reducing high blood pressure.  Vitamins and Minerals  Clinical staff conducted group or individual video education with verbal and written material and guidebook.  Patient learns different ways to obtain key vitamins and minerals, including through a recommended healthy diet. It is important to discuss all supplements you take with your doctor.   Healthy Mind-Set    Smoking Cessation  Clinical staff conducted group or individual video education with verbal and written material and guidebook.   Patient learns that cigarette smoking and tobacco addiction pose a serious health risk which affects millions of people. Stopping smoking will significantly reduce the risk of heart disease, lung disease, and many forms of cancer. Recommended strategies for quitting are covered, including working with your doctor to develop a successful plan.  Culinary   Becoming a Set designer conducted group or individual video education with verbal and written material and guidebook.  Patient learns that cooking at home can be healthy, cost-effective, quick, and puts them in control. Keys to cooking healthy  recipes will include looking at your recipe, assessing your equipment needs, planning ahead, making it simple, choosing cost-effective seasonal ingredients, and limiting the use of added fats, salts, and sugars.  Cooking - Breakfast and Snacks  Clinical staff conducted group or individual video education with verbal and written material and guidebook.  Patient learns how important breakfast is to satiety and nutrition through the entire day. Recommendations include key foods to eat during breakfast to help stabilize blood sugar levels and to prevent overeating at meals later in the day. Planning ahead is also a key component.  Cooking - Educational psychologist conducted group or individual video education with verbal and written material and guidebook.  Patient learns eating strategies to improve overall health, including an approach to cook more at home. Recommendations include thinking of animal protein as a side on your plate rather than center stage and focusing instead on lower calorie dense options like vegetables, fruits, whole grains, and plant-based proteins, such as beans. Making sauces in large quantities to freeze for later and leaving the skin on your vegetables are also recommended to maximize your experience.  Cooking - Healthy Salads and Dressing Clinical staff  conducted group or individual video education with verbal and written material and guidebook.  Patient learns that vegetables, fruits, whole grains, and legumes are the foundations of the Pritikin Eating Plan. Recommendations include how to incorporate each of these in flavorful and healthy salads, and how to create homemade salad dressings. Proper handling of ingredients is also covered. Cooking - Soups and [employer] - Soups and Desserts Clinical staff conducted group or individual video education with verbal and written material and guidebook.  Patient learns that Pritikin soups and desserts make for easy, nutritious, and delicious snacks and meal components that are low in sodium, fat, sugar, and calorie density, while high in vitamins, minerals, and filling fiber. Recommendations include simple and healthy ideas for soups and desserts.   Overview     The Pritikin Solution Program Overview Clinical staff conducted group or individual video education with verbal and written material and guidebook.  Patient learns that the results of the Pritikin Program have been documented in more than 100 articles published in peer-reviewed journals, and the benefits include reducing risk factors for (and, in some cases, even reversing) high cholesterol, high blood pressure, type 2 diabetes, obesity, and more! An overview of the three key pillars of the Pritikin Program will be covered: eating well, doing regular exercise, and having a healthy mind-set.  WORKSHOPS  Exercise: Exercise Basics: Building Your Action Plan Clinical staff led group instruction and group discussion with PowerPoint presentation and patient guidebook. To enhance the learning environment the use of posters, models and videos may be added. At the conclusion of this workshop, patients will comprehend the difference between physical activity and exercise, as well as the benefits of incorporating both, into their routine. Patients will  understand the FITT (Frequency, Intensity, Time, and Type) principle and how to use it to build an exercise action plan. In addition, safety concerns and other considerations for exercise and cardiac rehab will be addressed by the presenter. The purpose of this lesson is to promote a comprehensive and effective weekly exercise routine in order to improve patients' overall level of fitness.   Managing Heart Disease: Your Path to a Healthier Heart Clinical staff led group instruction and group discussion with PowerPoint presentation and patient guidebook. To enhance the learning environment the use of posters, models and  videos may be added.At the conclusion of this workshop, patients will understand the anatomy and physiology of the heart. Additionally, they will understand how Pritikin's three pillars impact the risk factors, the progression, and the management of heart disease.  The purpose of this lesson is to provide a high-level overview of the heart, heart disease, and how the Pritikin lifestyle positively impacts risk factors.  Exercise Biomechanics Clinical staff led group instruction and group discussion with PowerPoint presentation and patient guidebook. To enhance the learning environment the use of posters, models and videos may be added. Patients will learn how the structural parts of their bodies function and how these functions impact their daily activities, movement, and exercise. Patients will learn how to promote a neutral spine, learn how to manage pain, and identify ways to improve their physical movement in order to promote healthy living. The purpose of this lesson is to expose patients to common physical limitations that impact physical activity. Participants will learn practical ways to adapt and manage aches and pains, and to minimize their effect on regular exercise. Patients will learn how to maintain good posture while sitting, walking, and lifting.  Balance Training  and Fall Prevention  Clinical staff led group instruction and group discussion with PowerPoint presentation and patient guidebook. To enhance the learning environment the use of posters, models and videos may be added. At the conclusion of this workshop, patients will understand the importance of their sensorimotor skills (vision, proprioception, and the vestibular system) in maintaining their ability to balance as they age. Patients will apply a variety of balancing exercises that are appropriate for their current level of function. Patients will understand the common causes for poor balance, possible solutions to these problems, and ways to modify their physical environment in order to minimize their fall risk. The purpose of this lesson is to teach patients about the importance of maintaining balance as they age and ways to minimize their risk of falling.  WORKSHOPS   Nutrition:  Fueling a Ship broker led group instruction and group discussion with PowerPoint presentation and patient guidebook. To enhance the learning environment the use of posters, models and videos may be added. Patients will review the foundational principles of the Pritikin Eating Plan and understand what constitutes a serving size in each of the food groups. Patients will also learn Pritikin-friendly foods that are better choices when away from home and review make-ahead meal and snack options. Calorie density will be reviewed and applied to three nutrition priorities: weight maintenance, weight loss, and weight gain. The purpose of this lesson is to reinforce (in a group setting) the key concepts around what patients are recommended to eat and how to apply these guidelines when away from home by planning and selecting Pritikin-friendly options. Patients will understand how calorie density may be adjusted for different weight management goals.  Mindful Eating  Clinical staff led group instruction and group  discussion with PowerPoint presentation and patient guidebook. To enhance the learning environment the use of posters, models and videos may be added. Patients will briefly review the concepts of the Pritikin Eating Plan and the importance of low-calorie dense foods. The concept of mindful eating will be introduced as well as the importance of paying attention to internal hunger signals. Triggers for non-hunger eating and techniques for dealing with triggers will be explored. The purpose of this lesson is to provide patients with the opportunity to review the basic principles of the Pritikin Eating Plan, discuss the value of  eating mindfully and how to measure internal cues of hunger and fullness using the Hunger Scale. Patients will also discuss reasons for non-hunger eating and learn strategies to use for controlling emotional eating.  Targeting Your Nutrition Priorities Clinical staff led group instruction and group discussion with PowerPoint presentation and patient guidebook. To enhance the learning environment the use of posters, models and videos may be added. Patients will learn how to determine their genetic susceptibility to disease by reviewing their family history. Patients will gain insight into the importance of diet as part of an overall healthy lifestyle in mitigating the impact of genetics and other environmental insults. The purpose of this lesson is to provide patients with the opportunity to assess their personal nutrition priorities by looking at their family history, their own health history and current risk factors. Patients will also be able to discuss ways of prioritizing and modifying the Pritikin Eating Plan for their highest risk areas  Menu  Clinical staff led group instruction and group discussion with PowerPoint presentation and patient guidebook. To enhance the learning environment the use of posters, models and videos may be added. Using menus brought in from [employer],  or printed from [employer], patients will apply the Pritikin dining out guidelines that were presented in the [employer] video. Patients will also be able to practice these guidelines in a variety of provided scenarios. The purpose of this lesson is to provide patients with the opportunity to practice hands-on learning of the Pritikin Dining Out guidelines with actual menus and practice scenarios.  Label Reading Clinical staff led group instruction and group discussion with PowerPoint presentation and patient guidebook. To enhance the learning environment the use of posters, models and videos may be added. Patients will review and discuss the Pritikin label reading guidelines presented in Pritikin's Label Reading Educational series video. Using fool labels brought in from local grocery stores and markets, patients will apply the label reading guidelines and determine if the packaged food meet the Pritikin guidelines. The purpose of this lesson is to provide patients with the opportunity to review, discuss, and practice hands-on learning of the Pritikin Label Reading guidelines with actual packaged food labels. Cooking School  Pritikin's [employer] are designed to teach patients ways to prepare quick, simple, and affordable recipes at home. The importance of nutrition's role in chronic disease risk reduction is reflected in its emphasis in the overall Pritikin program. By learning how to prepare essential core Pritikin Eating Plan recipes, patients will increase control over what they eat; be able to customize the flavor of foods without the use of added salt, sugar, or fat; and improve the quality of the food they consume. By learning a set of core recipes which are easily assembled, quickly prepared, and affordable, patients are more likely to prepare more healthy foods at home. These workshops focus on convenient breakfasts, simple entres, side dishes, and desserts which  can be prepared with minimal effort and are consistent with nutrition recommendations for cardiovascular risk reduction. Cooking [employer] are taught by a Armed forces logistics/support/administrative officer (RD) who has been trained by the [employer]. The chef or RD has a clear understanding of the importance of minimizing - if not completely eliminating - added fat, sugar, and sodium in recipes. Throughout the series of Cooking School Workshop sessions, patients will learn about healthy ingredients and efficient methods of cooking to build confidence in their capability to prepare    [employer] weekly  topics:  Adding Flavor- Sodium-Free  Fast and Healthy Breakfasts  Powerhouse Plant-Based Proteins  Satisfying Salads and Dressings  Simple Sides and Sauces  International Cuisine-Spotlight on the Blue Zones  Delicious Desserts  Savory Soups  Efficiency Cooking - Meals in a Snap  Tasty Appetizers and Snacks  Comforting Weekend Breakfasts  One-Pot Wonders   Fast Evening Meals  Landscape architect Your Pritikin Plate  WORKSHOPS   Healthy Mindset (Psychosocial):  Focused Goals, Sustainable Changes Clinical staff led group instruction and group discussion with PowerPoint presentation and patient guidebook. To enhance the learning environment the use of posters, models and videos may be added. Patients will be able to apply effective goal setting strategies to establish at least one personal goal, and then take consistent, meaningful action toward that goal. They will learn to identify common barriers to achieving personal goals and develop strategies to overcome them. Patients will also gain an understanding of how our mind-set can impact our ability to achieve goals and the importance of cultivating a positive and growth-oriented mind-set. The purpose of this lesson is to provide patients with a deeper understanding of how to set and achieve personal goals, as well as the tools  and strategies needed to overcome common obstacles which may arise along the way.  From Head to Heart: The Power of a Healthy Outlook  Clinical staff led group instruction and group discussion with PowerPoint presentation and patient guidebook. To enhance the learning environment the use of posters, models and videos may be added. Patients will be able to recognize and describe the impact of emotions and mood on physical health. They will discover the importance of self-care and explore self-care practices which may work for them. Patients will also learn how to utilize the 4 C's to cultivate a healthier outlook and better manage stress and challenges. The purpose of this lesson is to demonstrate to patients how a healthy outlook is an essential part of maintaining good health, especially as they continue their cardiac rehab journey.  Healthy Sleep for a Healthy Heart Clinical staff led group instruction and group discussion with PowerPoint presentation and patient guidebook. To enhance the learning environment the use of posters, models and videos may be added. At the conclusion of this workshop, patients will be able to demonstrate knowledge of the importance of sleep to overall health, well-being, and quality of life. They will understand the symptoms of, and treatments for, common sleep disorders. Patients will also be able to identify daytime and nighttime behaviors which impact sleep, and they will be able to apply these tools to help manage sleep-related challenges. The purpose of this lesson is to provide patients with a general overview of sleep and outline the importance of quality sleep. Patients will learn about a few of the most common sleep disorders. Patients will also be introduced to the concept of "sleep hygiene," and discover ways to self-manage certain sleeping problems through simple daily behavior changes. Finally, the workshop will motivate patients by clarifying the links between  quality sleep and their goals of heart-healthy living.   Recognizing and Reducing Stress Clinical staff led group instruction and group discussion with PowerPoint presentation and patient guidebook. To enhance the learning environment the use of posters, models and videos may be added. At the conclusion of this workshop, patients will be able to understand the types of stress reactions, differentiate between acute and chronic stress, and recognize the impact that chronic stress has on their health. They will also be able  to apply different coping mechanisms, such as reframing negative self-talk. Patients will have the opportunity to practice a variety of stress management techniques, such as deep abdominal breathing, progressive muscle relaxation, and/or guided imagery.  The purpose of this lesson is to educate patients on the role of stress in their lives and to provide healthy techniques for coping with it.  Learning Barriers/Preferences:  Learning Barriers/Preferences - 05/24/23 1524       Learning Barriers/Preferences   Learning Barriers None;Exercise Concerns   Chizaram reports having left hip pain with walking after walking any extended distance and shortness of breath with exertion   Learning Preferences Skilled Demonstration;Video;Pictoral             Education Topics:  Knowledge Questionnaire Score:  Knowledge Questionnaire Score - 05/24/23 1442       Knowledge Questionnaire Score   Pre Score 19/24             Core Components/Risk Factors/Patient Goals at Admission:  Personal Goals and Risk Factors at Admission - 05/24/23 1403       Core Components/Risk Factors/Patient Goals on Admission    Weight Management Yes;Obesity;Weight Loss    Intervention Weight Management/Obesity: Establish reasonable short term and long term weight goals.;Obesity: Provide education and appropriate resources to help participant work on and attain dietary goals.    Admit Weight 313 lb 4.4 oz  (142.1 kg)    Expected Outcomes Short Term: Continue to assess and modify interventions until short term weight is achieved;Long Term: Adherence to nutrition and physical activity/exercise program aimed toward attainment of established weight goal;Weight Loss: Understanding of general recommendations for a balanced deficit meal plan, which promotes 1-2 lb weight loss per week and includes a negative energy balance of [phone] kcal/d    Improve shortness of breath with ADL's Yes    Intervention Provide education, individualized exercise plan and daily activity instruction to help decrease symptoms of SOB with activities of daily living.    Expected Outcomes Short Term: Improve cardiorespiratory fitness to achieve a reduction of symptoms when performing ADLs;Long Term: Be able to perform more ADLs without symptoms or delay the onset of symptoms    Heart Failure Yes    Intervention Provide a combined exercise and nutrition program that is supplemented with education, support and counseling about heart failure. Directed toward relieving symptoms such as shortness of breath, decreased exercise tolerance, and extremity edema.    Expected Outcomes Improve functional capacity of life;Short term: Attendance in program 2-3 days a week with increased exercise capacity. Reported lower sodium intake. Reported increased fruit and vegetable intake. Reports medication compliance.;Short term: Daily weights obtained and reported for increase. Utilizing diuretic protocols set by physician.;Long term: Adoption of self-care skills and reduction of barriers for early signs and symptoms recognition and intervention leading to self-care maintenance.    Hypertension Yes    Intervention Provide education on lifestyle modifcations including regular physical activity/exercise, weight management, moderate sodium restriction and increased consumption of fresh fruit, vegetables, and low fat dairy, alcohol moderation, and smoking  cessation.;Monitor prescription use compliance.    Expected Outcomes Short Term: Continued assessment and intervention until BP is < 140/26mm HG in hypertensive participants. < 130/59mm HG in hypertensive participants with diabetes, heart failure or chronic kidney disease.;Long Term: Maintenance of blood pressure at goal levels.    Stress Yes    Intervention Offer individual and/or small group education and counseling on adjustment to heart disease, stress management and health-related lifestyle change. Teach and support self-help strategies.;Refer  participants experiencing significant psychosocial distress to appropriate mental health specialists for further evaluation and treatment. When possible, include family members and significant others in education/counseling sessions.    Expected Outcomes Short Term: Participant demonstrates changes in health-related behavior, relaxation and other stress management skills, ability to obtain effective social support, and compliance with psychotropic medications if prescribed.;Long Term: Emotional wellbeing is indicated by absence of clinically significant psychosocial distress or social isolation.             Core Components/Risk Factors/Patient Goals Review:    Core Components/Risk Factors/Patient Goals at Discharge (Final Review):    ITP Comments:  ITP Comments     Row Name 05/24/23 1334           ITP Comments Medical Director- Dr. Armanda Magic, MD. Introduction to the Cardiac Rehab Program. Initial orientation folder reviewed.                Comments: Sallyann attended orientation for the cardiac rehabilitation program on  05/24/2023  to perform initial intake and exercise walk test. She was introduced to the [employer] education and orientation packet was reviewed. Completed 6-minute walk test, measurements, initial ITP, and exercise prescription. Vital signs stable. Telemetry - sinus tachycardia at rest, asymptomatic.   Service  time was from 1334 to 1535. Artist Pais, MS, ACSM CEP 2/25/20025 [phone]

## 2023-05-25 ENCOUNTER — Ambulatory Visit (HOSPITAL_COMMUNITY): Payer: Medicaid Other

## 2023-05-27 ENCOUNTER — Ambulatory Visit (HOSPITAL_COMMUNITY): Payer: Medicaid Other

## 2023-05-30 ENCOUNTER — Encounter (HOSPITAL_COMMUNITY)
Admission: RE | Admit: 2023-05-30 | Discharge: 2023-05-30 | Disposition: A | Discharge status: Home / Self Care | Payer: Medicaid Other | Source: Ambulatory Visit | Attending: Cardiology | Admitting: Cardiology

## 2023-05-30 ENCOUNTER — Ambulatory Visit (HOSPITAL_COMMUNITY): Payer: Medicaid Other

## 2023-05-30 DIAGNOSIS — Z5189 Encounter for other specified aftercare: Secondary | ICD-10-CM | POA: Diagnosis not present

## 2023-05-30 DIAGNOSIS — I5022 Chronic systolic (congestive) heart failure: Secondary | ICD-10-CM | POA: Insufficient documentation

## 2023-05-30 NOTE — Progress Notes (Signed)
 Cardiac Individual Treatment Plan  Patient Details  Name: YAMILA CRAGIN MRN: [id number] Date of Birth: [birth year] Referring Provider:   Flowsheet Row CARDIAC REHAB PHASE II ORIENTATION from 05/24/2023 in [facility]  Referring Provider Jake Bathe, MD       Initial Encounter Date:  Flowsheet Row CARDIAC REHAB PHASE II ORIENTATION from 05/24/2023 in [facility]  Date 05/24/23       Visit Diagnosis: Heart failure, chronic systolic (HCC)  Patient's Home Medications on Admission:  Current Outpatient Medications:    albuterol (PROVENTIL) (2.5 MG/3ML) 0.083% nebulizer solution, Take 3 mLs (2.5 mg total) by nebulization every 6 (six) hours as needed for wheezing or shortness of breath., Disp: 150 mL, Rfl: 1   albuterol (VENTOLIN HFA) 108 (90 Base) MCG/ACT inhaler, Inhale 2 puffs into the lungs every 6 (six) hours as needed for wheezing or shortness of breath., Disp: 8 g, Rfl: 2   ALPRAZolam (XANAX) 1 MG tablet, Take 1 tablet (1 mg total) by mouth 2 (two) times daily as needed for anxiety., Disp: 60 tablet, Rfl: 2   azelastine (ASTELIN) 0.1 % nasal spray, Place 2 sprays into both nostrils 2 (two) times daily. (Patient not taking: Reported on 04/28/2023), Disp: 30 mL, Rfl: 5   buPROPion (WELLBUTRIN XL) 150 MG 24 hr tablet, Take 3 tablets (450 mg total) by mouth daily., Disp: 90 tablet, Rfl: 5   carvedilol (COREG) 25 MG tablet, TAKE 1 TABLET (25 MG TOTAL) BY MOUTH TWICE A DAY WITH MEALS, Disp: 180 tablet, Rfl: 3   DULoxetine (CYMBALTA) 30 MG capsule, Take 1 capsule (30 mg total) by mouth daily., Disp: 30 capsule, Rfl: 0   DULoxetine (CYMBALTA) 60 MG capsule, Take 1 capsule (60 mg total) by mouth daily., Disp: 30 capsule, Rfl: 0   empagliflozin (JARDIANCE) 10 MG TABS tablet, TAKE ONE TABLET BY MOUTH DAILY BEFORE BREAKFAST, Disp: 90 tablet, Rfl: 3   Fluticasone-Salmeterol 232-14  MCG/ACT AEPB, Inhale 1 puff into the lungs in the morning and at bedtime. (Patient not taking: Reported on 05/24/2023), Disp: 1 each, Rfl: 5   furosemide (LASIX) 40 MG tablet, Take 1 tablet (40 mg total) by mouth daily. You may take 1 extra tablet (40 mg total) by mouth daily as needed for lower extremity swelling/weight gain., Disp: 105 tablet, Rfl: 2   levocetirizine (XYZAL) 5 MG tablet, TAKE 1 TABLET BY MOUTH EVERY DAY IN THE EVENING, Disp: 90 tablet, Rfl: 0   levonorgestrel (MIRENA, 52 MG,) 20 MCG/DAY IUD, , Disp: , Rfl:    lisdexamfetamine (VYVANSE) 50 MG capsule, Take 1 capsule (50 mg total) by mouth daily., Disp: 30 capsule, Rfl: 0   meloxicam (MOBIC) 15 MG tablet, TAKE 1 TABLET BY MOUTH EVERY DAY AS NEEDED FOR PAIN (Patient not taking: Reported on 05/24/2023), Disp: 30 tablet, Rfl: 0   montelukast (SINGULAIR) 10 MG tablet, TAKE 1 TABLET BY MOUTH EVERYDAY AT BEDTIME, Disp: 90 tablet, Rfl: 2   omeprazole (PRILOSEC) 40 MG capsule, TAKE 1 CAPSULE BY MOUTH EVERY DAY (Patient not taking: Reported on 03/24/2022), Disp: 90 capsule, Rfl: 1   sacubitril-valsartan (ENTRESTO) 97-103 MG, Take 1 tablet by mouth 2 (two) times daily., Disp: 60 tablet, Rfl: 11   spironolactone (ALDACTONE) 25 MG tablet, Take 1 tablet (25 mg total) by mouth daily., Disp: 90 tablet, Rfl: 3   traZODone (DESYREL) 50 MG tablet, Take 1 tablet (50 mg total) by  mouth at bedtime., Disp: 30 tablet, Rfl: 5   Vitamin D, Ergocalciferol, (DRISDOL) 1.25 MG (50000 UNIT) CAPS capsule, Take 50,000 Units by mouth once a week. (Patient not taking: Reported on 05/24/2023), Disp: , Rfl:   Past Medical History: Past Medical History:  Diagnosis Date   Anxiety    panic attacks   Asthma    CHF (congestive heart failure), NYHA class II, acute, systolic (HCC) 10/06/2018   Depression    Fibromyalgia    Hypertension    no meds currently   No pertinent past medical history    Pneumonia    hx of    Sinus problem     Tobacco Use: Social History    Tobacco Use  Smoking Status Never  Smokeless Tobacco Never    Labs: Review Flowsheet       Latest Ref Rng & Units 08/17/2019 11/19/2019 05/08/2020 01/06/2022  Labs for ITP Cardiac and Pulmonary Rehab  Cholestrol 100 - 199 mg/dL 621  308  - -  LDL (calc) 0 - 99 mg/dL 82  91  - -  HDL-C >65 mg/dL 53  50  - -  Trlycerides 0 - 149 mg/dL 784  93  - -  Hemoglobin A1c 4.8 - 5.6 % 6.3  - 6.0  5.9     Capillary Blood Glucose: No results found for: "GLUCAP"   Exercise Target Goals: Exercise Program Goal: Individual exercise prescription set using results from initial 6 min walk test and THRR while considering  patient's activity barriers and safety.   Exercise Prescription Goal: Initial exercise prescription builds to 30-45 minutes a day of aerobic activity, 2-3 days per week.  Home exercise guidelines will be given to patient during program as part of exercise prescription that the participant will acknowledge.  Activity Barriers & Risk Stratification:  Activity Barriers & Cardiac Risk Stratification - 05/24/23 1404       Activity Barriers & Cardiac Risk Stratification   Activity Barriers Fibromyalgia;Deconditioning;Other (comment);Shortness of Breath;Assistive Device    Comments Left hip pain. She has a cane but not currently using.    Cardiac Risk Stratification Moderate             6 Minute Walk:  6 Minute Walk     Row Name 05/24/23 1513         6 Minute Walk   Phase Initial     Distance 900 feet     Walk Time 6 minutes     # of Rest Breaks 0     MPH 1.7     METS 2.8     RPE 12     Perceived Dyspnea  1     VO2 Peak 9.81     Symptoms Yes (comment)     Comments Mild shortness of breath.     Resting HR 105 bpm     Resting BP 128/78     Resting Oxygen Saturation  97 %     Exercise Oxygen Saturation  during 6 min walk 98 %     Max Ex. HR 140 bpm     Max Ex. BP 172/82     2 Minute Post BP 158/78              Oxygen Initial Assessment:   Oxygen  Re-Evaluation:   Oxygen Discharge (Final Oxygen Re-Evaluation):   Initial Exercise Prescription:  Initial Exercise Prescription - 05/24/23 1500       Date of Initial Exercise RX and Referring Provider  Date 05/24/23    Referring Provider Jake Bathe, MD    Expected Discharge Date 08/17/23      Treadmill   MPH 1.7    Grade 0    Minutes 10    METs 2.3      T5 Nustep   Level 1    SPM 85    Minutes 20    METs 2.3      Prescription Details   Frequency (times per week) 3    Duration Progress to 30 minutes of continuous aerobic without signs/symptoms of physical distress      Intensity   THRR 40-80% of Max Heartrate 70-139    Ratings of Perceived Exertion 11-13    Perceived Dyspnea 0-4      Progression   Progression Continue to progress workloads to maintain intensity without signs/symptoms of physical distress.      Resistance Training   Training Prescription Yes    Weight 2 lbs    Reps 10-15             Perform Capillary Blood Glucose checks as needed.  Exercise Prescription Changes:   Exercise Prescription Changes     Row Name 05/30/23 1709             Response to Exercise   Blood Pressure (Admit) 116/64       Blood Pressure (Exercise) 138/68       Blood Pressure (Exit) 124/86       Heart Rate (Admit) 80 bpm       Heart Rate (Exercise) 130 bpm       Heart Rate (Exit) 89 bpm       Rating of Perceived Exertion (Exercise) 10.5       Perceived Dyspnea (Exercise) 0       Symptoms 0       Comments Pt first day in the Pritikin ICR program       Duration Progress to 30 minutes of  aerobic without signs/symptoms of physical distress       Intensity THRR unchanged         Progression   Progression Continue to progress workloads to maintain intensity without signs/symptoms of physical distress.       Average METs 2.15         Resistance Training   Training Prescription Yes       Weight 2 lbs       Reps 10-15       Time 10 Minutes          Treadmill   MPH 1.7       Grade 0       Minutes 10       METs 2.3         T5 Nustep   Level 1       SPM 88       Minutes 20       METs 10                Exercise Comments:   Exercise Comments     Row Name 05/30/23 1712           Exercise Comments Pt first day in the Pritikin ICR program. Pt tolerated exercise well with an average MET level of 2.15. Pt is learning her THRR, RPE and ExRx. Will continue to monitor pt and progress workloads as tolerated without sign or symptom  Exercise Goals and Review:   Exercise Goals     Row Name 05/24/23 1407             Exercise Goals   Increase Physical Activity Yes       Intervention Provide advice, education, support and counseling about physical activity/exercise needs.;Develop an individualized exercise prescription for aerobic and resistive training based on initial evaluation findings, risk stratification, comorbidities and participant's personal goals.       Expected Outcomes Short Term: Attend rehab on a regular basis to increase amount of physical activity.;Long Term: Exercising regularly at least 3-5 days a week.;Long Term: Add in home exercise to make exercise part of routine and to increase amount of physical activity.       Increase Strength and Stamina Yes       Intervention Provide advice, education, support and counseling about physical activity/exercise needs.;Develop an individualized exercise prescription for aerobic and resistive training based on initial evaluation findings, risk stratification, comorbidities and participant's personal goals.       Expected Outcomes Short Term: Increase workloads from initial exercise prescription for resistance, speed, and METs.;Short Term: Perform resistance training exercises routinely during rehab and add in resistance training at home;Long Term: Improve cardiorespiratory fitness, muscular endurance and strength as measured by increased METs and functional  capacity ( )       Able to understand and use rate of perceived exertion (RPE) scale Yes       Intervention Provide education and explanation on how to use RPE scale       Expected Outcomes Short Term: Able to use RPE daily in rehab to express subjective intensity level;Long Term:  Able to use RPE to guide intensity level when exercising independently       Knowledge and understanding of Target Heart Rate Range (THRR) Yes       Intervention Provide education and explanation of THRR including how the numbers were predicted and where they are located for reference       Expected Outcomes Short Term: Able to state/look up THRR;Long Term: Able to use THRR to govern intensity when exercising independently;Short Term: Able to use daily as guideline for intensity in rehab       Able to check pulse independently Yes       Intervention Provide education and demonstration on how to check pulse in carotid and radial arteries.;Review the importance of being able to check your own pulse for safety during independent exercise       Expected Outcomes Short Term: Able to explain why pulse checking is important during independent exercise;Long Term: Able to check pulse independently and accurately       Understanding of Exercise Prescription Yes       Intervention Provide education, explanation, and written materials on patient's individual exercise prescription       Expected Outcomes Short Term: Able to explain program exercise prescription;Long Term: Able to explain home exercise prescription to exercise independently                Exercise Goals Re-Evaluation :  Exercise Goals Re-Evaluation     Row Name 05/30/23 1711             Exercise Goal Re-Evaluation   Exercise Goals Review Increase Physical Activity;Understanding of Exercise Prescription;Increase Strength and Stamina;Knowledge and understanding of Target Heart Rate Range (THRR);Able to understand and use rate of perceived exertion (RPE)  scale       Comments Pt first day in the Pritikin ICR  program. Pt tolerated exercise well with an average MET level of 2.15. Pt is learning her THRR, RPE and ExRx.       Expected Outcomes Will continue to monitor pt and progress workloads as tolerated without sign or symptom                Discharge Exercise Prescription (Final Exercise Prescription Changes):  Exercise Prescription Changes - 05/30/23 1709       Response to Exercise   Blood Pressure (Admit) 116/64    Blood Pressure (Exercise) 138/68    Blood Pressure (Exit) 124/86    Heart Rate (Admit) 80 bpm    Heart Rate (Exercise) 130 bpm    Heart Rate (Exit) 89 bpm    Rating of Perceived Exertion (Exercise) 10.5    Perceived Dyspnea (Exercise) 0    Symptoms 0    Comments Pt first day in the Pritikin ICR program    Duration Progress to 30 minutes of  aerobic without signs/symptoms of physical distress    Intensity THRR unchanged      Progression   Progression Continue to progress workloads to maintain intensity without signs/symptoms of physical distress.    Average METs 2.15      Resistance Training   Training Prescription Yes    Weight 2 lbs    Reps 10-15    Time 10 Minutes      Treadmill   MPH 1.7    Grade 0    Minutes 10    METs 2.3      T5 Nustep   Level 1    SPM 88    Minutes 20    METs 10             Nutrition:  Target Goals: Understanding of nutrition guidelines, daily intake of sodium 1500mg , cholesterol 200mg , calories 30% from fat and 7% or less from saturated fats, daily to have 5 or more servings of fruits and vegetables.  Biometrics:  Pre Biometrics - 05/24/23 1334       Pre Biometrics   Waist Circumference 61.5 inches    Hip Circumference 63 inches    Waist to Hip Ratio 0.98 %    Triceps Skinfold 45 mm    % Body Fat 65.7 %    Grip Strength 31 kg    Flexibility 11 in    Single Leg Stand 30 seconds              Nutrition Therapy Plan and Nutrition Goals:   Nutrition  Assessments:  MEDIFICTS Score Key: >=70 Need to make dietary changes  40-70 Heart Healthy Diet <= 40 Therapeutic Level Cholesterol Diet    Picture Your Plate Scores: <09 Unhealthy dietary pattern with much room for improvement. 41-50 Dietary pattern unlikely to meet recommendations for good health and room for improvement. 51-60 More healthful dietary pattern, with some room for improvement.  >60 Healthy dietary pattern, although there may be some specific behaviors that could be improved.    Nutrition Goals Re-Evaluation:   Nutrition Goals Re-Evaluation:   Nutrition Goals Discharge (Final Nutrition Goals Re-Evaluation):   Psychosocial: Target Goals: Acknowledge presence or absence of significant depression and/or stress, maximize coping skills, provide positive support system. Participant is able to verbalize types and ability to use techniques and skills needed for reducing stress and depression.  Initial Review & Psychosocial Screening:  Initial Psych Review & Screening - 05/24/23 1512       Initial Review   Current issues with Current Depression;History  of Depression;Current Anxiety/Panic;Current Psychotropic Meds;Current Sleep Concerns;Current Stress Concerns    Source of Stress Concerns Chronic Illness;Financial;Poor Coping Skills;Unable to participate in former interests or hobbies;Unable to perform yard/household activities;Occupation    Comments Chandell is currently depressed as she has been battling severe depression for the past several years. Christella says the medications she is taking for anxiety and depression are being titrated. Taressa hopes that her anitdepressant will become more effective in managing her depression. Tashayla has a Paramedic and a psychiatirst that she currently sees. Michaelia previously had issues with obtaining medication due to no income. Housing insufficiency. Dempsey now has housing [employer].      Family Dynamics   Good Support  System? Yes   Miniya says that she has a good support network of friends and family in the area.   Comments Vayla would like to eventually change her therapy and counselling to a location closer to her home in [facility] if possible.      Barriers   Psychosocial barriers to participate in program The patient should benefit from training in stress management and relaxation.      Screening Interventions   Interventions To provide support and resources with identified psychosocial needs;Provide feedback about the scores to participant;Encouraged to exercise    Expected Outcomes Short Term goal: Utilizing psychosocial counselor, staff and physician to assist with identification of specific Stressors or current issues interfering with healing process. Setting desired goal for each stressor or current issue identified.;Long Term Goal: Stressors or current issues are controlled or eliminated.;Short Term goal: Identification and review with participant of any Quality of Life or Depression concerns found by scoring the questionnaire.;Long Term goal: The participant improves quality of Life and PHQ9 Scores as seen by post scores and/or verbalization of changes             Quality of Life Scores:  Quality of Life - 05/24/23 1439       Quality of Life   Select Quality of Life      Quality of Life Scores   Health/Function Pre 5.07 %    Socioeconomic Pre 6.63 %    Psych/Spiritual Pre 5.71 %    Family Pre 15.6 %    GLOBAL Pre 7.06 %            Scores of 19 and below usually indicate a poorer quality of life in these areas.  A difference of  2-3 points is a clinically meaningful difference.  A difference of 2-3 points in the total score of the Quality of Life Index has been associated with significant improvement in overall quality of life, self-image, physical symptoms, and general health in studies assessing change in quality of life.  PHQ-9: Review Flowsheet  More data exists       05/24/2023 04/22/2022 03/24/2022 02/25/2022 01/06/2022  Depression screen PHQ 2/9  Decreased Interest 3  1 2  0  Down, Depressed, Hopeless 3  2 3 1   PHQ - 2 Score 6  3 5 1   Altered sleeping 3  3 3 3   Tired, decreased energy 2  3 3 2   Change in appetite 2  1 3 1   Feeling bad or failure about yourself  1  3 0 0  Trouble concentrating 2  3 3 3   Moving slowly or fidgety/restless 0  0 2 0  Suicidal thoughts 0  0 0 0  PHQ-9 Score 16  16 19 10   Difficult doing work/chores Extremely dIfficult  Somewhat difficult Very  difficult Extremely dIfficult    Details       Information is confidential and restricted. Go to Review Flowsheets to unlock data.        Interpretation of Total Score  Total Score Depression Severity:  1-4 = Minimal depression, 5-9 = Mild depression, 10-14 = Moderate depression, 15-19 = Moderately severe depression, 20-27 = Severe depression   Psychosocial Evaluation and Intervention:   Psychosocial Re-Evaluation:   Psychosocial Discharge (Final Psychosocial Re-Evaluation):   Vocational Rehabilitation: Provide vocational rehab assistance to qualifying candidates.   Vocational Rehab Evaluation & Intervention:  Vocational Rehab - 05/24/23 1526       Initial Vocational Rehab Evaluation & Intervention   Assessment shows need for Vocational Rehabilitation Yes    Vocational Rehab Packet given to patient 05/24/23      Vocational Rehab Re-Evaulation   Comments Ebonique was given a vocational rehab packet to review and return on her next scheduled exercise session. De             Education: Education Goals: Education classes will be provided on a weekly basis, covering required topics. Participant will state understanding/return demonstration of topics presented.    Education     Row Name 05/30/23 1400     Education   Cardiac Education Topics Pritikin   Select Workshops     Workshops   Educator Exercise Physiologist   Select Psychosocial   Psychosocial  Workshop Recognizing and Reducing Stress   Instruction Review Code 1- Verbalizes Understanding   Class Start Time 1358   Class Stop Time 1445   Class Time Calculation (min) 47 min            Core Videos: Exercise    Move It!  Clinical staff conducted group or individual video education with verbal and written material and guidebook.  Patient learns the recommended Pritikin exercise program. Exercise with the goal of living a long, healthy life. Some of the health benefits of exercise include controlled diabetes, healthier blood pressure levels, improved cholesterol levels, improved heart and lung capacity, improved sleep, and better body composition. Everyone should speak with their doctor before starting or changing an exercise routine.  Biomechanical Limitations Clinical staff conducted group or individual video education with verbal and written material and guidebook.  Patient learns how biomechanical limitations can impact exercise and how we can mitigate and possibly overcome limitations to have an impactful and balanced exercise routine.  Body Composition Clinical staff conducted group or individual video education with verbal and written material and guidebook.  Patient learns that body composition (ratio of muscle mass to fat mass) is a key component to assessing overall fitness, rather than body weight alone. Increased fat mass, especially visceral belly fat, can put [place] at increased risk for metabolic syndrome, type 2 diabetes, heart disease, and even death. It is recommended to combine diet and exercise (cardiovascular and resistance training) to improve your body composition. Seek guidance from your physician and exercise physiologist before implementing an exercise routine.  Exercise Action Plan Clinical staff conducted group or individual video education with verbal and written material and guidebook.  Patient learns the recommended strategies to achieve and enjoy long-term  exercise adherence, including variety, self-motivation, self-efficacy, and positive decision making. Benefits of exercise include fitness, good health, weight management, more energy, better sleep, less stress, and overall well-being.  Medical   Heart Disease Risk Reduction Clinical staff conducted group or individual video education with verbal and written material and guidebook.  Patient learns our heart  is our most vital organ as it circulates oxygen, nutrients, white blood cells, and hormones throughout the entire body, and carries waste away. Data supports a plant-based eating plan like the Pritikin Program for its effectiveness in slowing progression of and reversing heart disease. The video provides a number of recommendations to address heart disease.   Metabolic Syndrome and Belly Fat  Clinical staff conducted group or individual video education with verbal and written material and guidebook.  Patient learns what metabolic syndrome is, how it leads to heart disease, and how one can reverse it and keep it from coming back. You have metabolic syndrome if you have 3 of the following 5 criteria: abdominal obesity, high blood pressure, high triglycerides, low HDL cholesterol, and high blood sugar.  Hypertension and Heart Disease Clinical staff conducted group or individual video education with verbal and written material and guidebook.  Patient learns that high blood pressure, or hypertension, is very common in the [place]. Hypertension is largely due to excessive salt intake, but other important risk factors include being overweight, physical inactivity, drinking too much alcohol, smoking, and not eating enough potassium from fruits and vegetables. High blood pressure is a leading risk factor for heart attack, stroke, congestive heart failure, dementia, kidney failure, and premature death. Long-term effects of excessive salt intake include stiffening of the arteries and thickening of heart  muscle and organ damage. Recommendations include ways to reduce hypertension and the risk of heart disease.  Diseases of Our Time - Focusing on Diabetes Clinical staff conducted group or individual video education with verbal and written material and guidebook.  Patient learns why the best way to stop diseases of our time is prevention, through food and other lifestyle changes. Medicine (such as prescription pills and surgeries) is often only a Band-Aid on the problem, not a long-term solution. Most common diseases of our time include obesity, type 2 diabetes, hypertension, heart disease, and cancer. The Pritikin Program is recommended and has been proven to help reduce, reverse, and/or prevent the damaging effects of metabolic syndrome.  Nutrition   Overview of the Pritikin Eating Plan  Clinical staff conducted group or individual video education with verbal and written material and guidebook.  Patient learns about the Pritikin Eating Plan for disease risk reduction. The Pritikin Eating Plan emphasizes a wide variety of unrefined, minimally-processed carbohydrates, like fruits, vegetables, whole grains, and legumes. Go, Caution, and Stop food choices are explained. Plant-based and lean animal proteins are emphasized. Rationale provided for low sodium intake for blood pressure control, low added sugars for blood sugar stabilization, and low added fats and oils for coronary artery disease risk reduction and weight management.  Calorie Density  Clinical staff conducted group or individual video education with verbal and written material and guidebook.  Patient learns about calorie density and how it impacts the Pritikin Eating Plan. Knowing the characteristics of the food you choose will help you decide whether those foods will lead to weight gain or weight loss, and whether you want to consume more or less of them. Weight loss is usually a side effect of the Pritikin Eating Plan because of its focus on  low calorie-dense foods.  Label Reading  Clinical staff conducted group or individual video education with verbal and written material and guidebook.  Patient learns about the Pritikin recommended label reading guidelines and corresponding recommendations regarding calorie density, added sugars, sodium content, and whole grains.  Dining Out - Part 1  Clinical staff conducted group or individual video  education with verbal and written material and guidebook.  Patient learns that restaurant meals can be sabotaging because they can be so high in calories, fat, sodium, and/or sugar. Patient learns recommended strategies on how to positively address this and avoid unhealthy pitfalls.  Facts on Fats  Clinical staff conducted group or individual video education with verbal and written material and guidebook.  Patient learns that lifestyle modifications can be just as effective, if not more so, as many medications for lowering your risk of heart disease. A Pritikin lifestyle can help to reduce your risk of inflammation and atherosclerosis (cholesterol build-up, or plaque, in the artery walls). Lifestyle interventions such as dietary choices and physical activity address the cause of atherosclerosis. A review of the types of fats and their impact on blood cholesterol levels, along with dietary recommendations to reduce fat intake is also included.  Nutrition Action Plan  Clinical staff conducted group or individual video education with verbal and written material and guidebook.  Patient learns how to incorporate Pritikin recommendations into their lifestyle. Recommendations include planning and keeping personal health goals in mind as an important part of their success.  Healthy Mind-Set    Healthy Minds, Bodies, Hearts  Clinical staff conducted group or individual video education with verbal and written material and guidebook.  Patient learns how to identify when they are stressed. Video will discuss  the impact of that stress, as well as the many benefits of stress management. Patient will also be introduced to stress management techniques. The way we think, act, and feel has an impact on our hearts.  How Our Thoughts Can Heal Our Hearts  Clinical staff conducted group or individual video education with verbal and written material and guidebook.  Patient learns that negative thoughts can cause depression and anxiety. This can result in negative lifestyle behavior and serious health problems. Cognitive behavioral therapy is an effective method to help control our thoughts in order to change and improve our emotional outlook.  Additional Videos:  Exercise    Improving Performance  Clinical staff conducted group or individual video education with verbal and written material and guidebook.  Patient learns to use a non-linear approach by alternating intensity levels and lengths of time spent exercising to help burn more calories and lose more body fat. Cardiovascular exercise helps improve heart health, metabolism, hormonal balance, blood sugar control, and recovery from fatigue. Resistance training improves strength, endurance, balance, coordination, reaction time, metabolism, and muscle mass. Flexibility exercise improves circulation, posture, and balance. Seek guidance from your physician and exercise physiologist before implementing an exercise routine and learn your capabilities and proper form for all exercise.  Introduction to Yoga  Clinical staff conducted group or individual video education with verbal and written material and guidebook.  Patient learns about yoga, a discipline of the coming together of mind, breath, and body. The benefits of yoga include improved flexibility, improved range of motion, better posture and core strength, increased lung function, weight loss, and positive self-image. Yoga's heart health benefits include lowered blood pressure, healthier heart rate, decreased  cholesterol and triglyceride levels, improved immune function, and reduced stress. Seek guidance from your physician and exercise physiologist before implementing an exercise routine and learn your capabilities and proper form for all exercise.  Medical   Aging: Enhancing Your Quality of Life  Clinical staff conducted group or individual video education with verbal and written material and guidebook.  Patient learns key strategies and recommendations to stay in good physical health and enhance quality  of life, such as prevention strategies, having an advocate, securing a Health Care Proxy and Power of Attorney, and keeping a list of medications and system for tracking them. It also discusses how to avoid risk for bone loss.  Biology of Weight Control  Clinical staff conducted group or individual video education with verbal and written material and guidebook.  Patient learns that weight gain occurs because we consume more calories than we burn (eating more, moving less). Even if your body weight is normal, you may have higher ratios of fat compared to muscle mass. Too much body fat puts you at increased risk for cardiovascular disease, heart attack, stroke, type 2 diabetes, and obesity-related cancers. In addition to exercise, following the Pritikin Eating Plan can help reduce your risk.  Decoding Lab Results  Clinical staff conducted group or individual video education with verbal and written material and guidebook.  Patient learns that lab test reflects one measurement whose values change over time and are influenced by many factors, including medication, stress, sleep, exercise, food, hydration, pre-existing medical conditions, and more. It is recommended to use the knowledge from this video to become more involved with your lab results and evaluate your numbers to speak with your doctor.   Diseases of Our Time - Overview  Clinical staff conducted group or individual video education with verbal  and written material and guidebook.  Patient learns that according to the CDC, 50% to 70% of chronic diseases (such as obesity, type 2 diabetes, elevated lipids, hypertension, and heart disease) are avoidable through lifestyle improvements including healthier food choices, listening to satiety cues, and increased physical activity.  Sleep Disorders Clinical staff conducted group or individual video education with verbal and written material and guidebook.  Patient learns how good quality and duration of sleep are important to overall health and well-being. Patient also learns about sleep disorders and how they impact health along with recommendations to address them, including discussing with a physician.  Nutrition  Dining Out - Part 2 Clinical staff conducted group or individual video education with verbal and written material and guidebook.  Patient learns how to plan ahead and communicate in order to maximize their dining experience in a healthy and nutritious manner. Included are recommended food choices based on the type of restaurant the patient is visiting.   Fueling a Banker conducted group or individual video education with verbal and written material and guidebook.  There is a strong connection between our food choices and our health. Diseases like obesity and type 2 diabetes are very prevalent and are in large-part due to lifestyle choices. The Pritikin Eating Plan provides plenty of food and hunger-curbing satisfaction. It is easy to follow, affordable, and helps reduce health risks.  Menu Workshop  Clinical staff conducted group or individual video education with verbal and written material and guidebook.  Patient learns that restaurant meals can sabotage health goals because they are often packed with calories, fat, sodium, and sugar. Recommendations include strategies to plan ahead and to communicate with the manager, chef, or server to help order a healthier  meal.  Planning Your Eating Strategy  Clinical staff conducted group or individual video education with verbal and written material and guidebook.  Patient learns about the Pritikin Eating Plan and its benefit of reducing the risk of disease. The Pritikin Eating Plan does not focus on calories. Instead, it emphasizes high-quality, nutrient-rich foods. By knowing the characteristics of the foods, we choose, we can determine their calorie  density and make informed decisions.  Targeting Your Nutrition Priorities  Clinical staff conducted group or individual video education with verbal and written material and guidebook.  Patient learns that lifestyle habits have a tremendous impact on disease risk and progression. This video provides eating and physical activity recommendations based on your personal health goals, such as reducing LDL cholesterol, losing weight, preventing or controlling type 2 diabetes, and reducing high blood pressure.  Vitamins and Minerals  Clinical staff conducted group or individual video education with verbal and written material and guidebook.  Patient learns different ways to obtain key vitamins and minerals, including through a recommended healthy diet. It is important to discuss all supplements you take with your doctor.   Healthy Mind-Set    Smoking Cessation  Clinical staff conducted group or individual video education with verbal and written material and guidebook.  Patient learns that cigarette smoking and tobacco addiction pose a serious health risk which affects millions of people. Stopping smoking will significantly reduce the risk of heart disease, lung disease, and many forms of cancer. Recommended strategies for quitting are covered, including working with your doctor to develop a successful plan.  Culinary   Becoming a Set designer conducted group or individual video education with verbal and written material and guidebook.  Patient learns  that cooking at home can be healthy, cost-effective, quick, and puts them in control. Keys to cooking healthy recipes will include looking at your recipe, assessing your equipment needs, planning ahead, making it simple, choosing cost-effective seasonal ingredients, and limiting the use of added fats, salts, and sugars.  Cooking - Breakfast and Snacks  Clinical staff conducted group or individual video education with verbal and written material and guidebook.  Patient learns how important breakfast is to satiety and nutrition through the entire day. Recommendations include key foods to eat during breakfast to help stabilize blood sugar levels and to prevent overeating at meals later in the day. Planning ahead is also a key component.  Cooking - Educational psychologist conducted group or individual video education with verbal and written material and guidebook.  Patient learns eating strategies to improve overall health, including an approach to cook more at home. Recommendations include thinking of animal protein as a side on your plate rather than center stage and focusing instead on lower calorie dense options like vegetables, fruits, whole grains, and plant-based proteins, such as beans. Making sauces in large quantities to freeze for later and leaving the skin on your vegetables are also recommended to maximize your experience.  Cooking - Healthy Salads and Dressing Clinical staff conducted group or individual video education with verbal and written material and guidebook.  Patient learns that vegetables, fruits, whole grains, and legumes are the foundations of the Pritikin Eating Plan. Recommendations include how to incorporate each of these in flavorful and healthy salads, and how to create homemade salad dressings. Proper handling of ingredients is also covered. Cooking - Soups and [employer] - Soups and Desserts Clinical staff conducted group or individual video education with  verbal and written material and guidebook.  Patient learns that Pritikin soups and desserts make for easy, nutritious, and delicious snacks and meal components that are low in sodium, fat, sugar, and calorie density, while high in vitamins, minerals, and filling fiber. Recommendations include simple and healthy ideas for soups and desserts.   Overview     The Pritikin Solution Program Overview Clinical staff conducted group or individual video education  with verbal and written material and guidebook.  Patient learns that the results of the Pritikin Program have been documented in more than 100 articles published in peer-reviewed journals, and the benefits include reducing risk factors for (and, in some cases, even reversing) high cholesterol, high blood pressure, type 2 diabetes, obesity, and more! An overview of the three key pillars of the Pritikin Program will be covered: eating well, doing regular exercise, and having a healthy mind-set.  WORKSHOPS  Exercise: Exercise Basics: Building Your Action Plan Clinical staff led group instruction and group discussion with PowerPoint presentation and patient guidebook. To enhance the learning environment the use of posters, models and videos may be added. At the conclusion of this workshop, patients will comprehend the difference between physical activity and exercise, as well as the benefits of incorporating both, into their routine. Patients will understand the FITT (Frequency, Intensity, Time, and Type) principle and how to use it to build an exercise action plan. In addition, safety concerns and other considerations for exercise and cardiac rehab will be addressed by the presenter. The purpose of this lesson is to promote a comprehensive and effective weekly exercise routine in order to improve patients' overall level of fitness.   Managing Heart Disease: Your Path to a Healthier Heart Clinical staff led group instruction and group discussion with  PowerPoint presentation and patient guidebook. To enhance the learning environment the use of posters, models and videos may be added.At the conclusion of this workshop, patients will understand the anatomy and physiology of the heart. Additionally, they will understand how Pritikin's three pillars impact the risk factors, the progression, and the management of heart disease.  The purpose of this lesson is to provide a high-level overview of the heart, heart disease, and how the Pritikin lifestyle positively impacts risk factors.  Exercise Biomechanics Clinical staff led group instruction and group discussion with PowerPoint presentation and patient guidebook. To enhance the learning environment the use of posters, models and videos may be added. Patients will learn how the structural parts of their bodies function and how these functions impact their daily activities, movement, and exercise. Patients will learn how to promote a neutral spine, learn how to manage pain, and identify ways to improve their physical movement in order to promote healthy living. The purpose of this lesson is to expose patients to common physical limitations that impact physical activity. Participants will learn practical ways to adapt and manage aches and pains, and to minimize their effect on regular exercise. Patients will learn how to maintain good posture while sitting, walking, and lifting.  Balance Training and Fall Prevention  Clinical staff led group instruction and group discussion with PowerPoint presentation and patient guidebook. To enhance the learning environment the use of posters, models and videos may be added. At the conclusion of this workshop, patients will understand the importance of their sensorimotor skills (vision, proprioception, and the vestibular system) in maintaining their ability to balance as they age. Patients will apply a variety of balancing exercises that are appropriate for their  current level of function. Patients will understand the common causes for poor balance, possible solutions to these problems, and ways to modify their physical environment in order to minimize their fall risk. The purpose of this lesson is to teach patients about the importance of maintaining balance as they age and ways to minimize their risk of falling.  WORKSHOPS   Nutrition:  Fueling a Ship broker led group instruction and group discussion with PowerPoint  presentation and patient guidebook. To enhance the learning environment the use of posters, models and videos may be added. Patients will review the foundational principles of the Pritikin Eating Plan and understand what constitutes a serving size in each of the food groups. Patients will also learn Pritikin-friendly foods that are better choices when away from home and review make-ahead meal and snack options. Calorie density will be reviewed and applied to three nutrition priorities: weight maintenance, weight loss, and weight gain. The purpose of this lesson is to reinforce (in a group setting) the key concepts around what patients are recommended to eat and how to apply these guidelines when away from home by planning and selecting Pritikin-friendly options. Patients will understand how calorie density may be adjusted for different weight management goals.  Mindful Eating  Clinical staff led group instruction and group discussion with PowerPoint presentation and patient guidebook. To enhance the learning environment the use of posters, models and videos may be added. Patients will briefly review the concepts of the Pritikin Eating Plan and the importance of low-calorie dense foods. The concept of mindful eating will be introduced as well as the importance of paying attention to internal hunger signals. Triggers for non-hunger eating and techniques for dealing with triggers will be explored. The purpose of this lesson is to provide  patients with the opportunity to review the basic principles of the Pritikin Eating Plan, discuss the value of eating mindfully and how to measure internal cues of hunger and fullness using the Hunger Scale. Patients will also discuss reasons for non-hunger eating and learn strategies to use for controlling emotional eating.  Targeting Your Nutrition Priorities Clinical staff led group instruction and group discussion with PowerPoint presentation and patient guidebook. To enhance the learning environment the use of posters, models and videos may be added. Patients will learn how to determine their genetic susceptibility to disease by reviewing their family history. Patients will gain insight into the importance of diet as part of an overall healthy lifestyle in mitigating the impact of genetics and other environmental insults. The purpose of this lesson is to provide patients with the opportunity to assess their personal nutrition priorities by looking at their family history, their own health history and current risk factors. Patients will also be able to discuss ways of prioritizing and modifying the Pritikin Eating Plan for their highest risk areas  Menu  Clinical staff led group instruction and group discussion with PowerPoint presentation and patient guidebook. To enhance the learning environment the use of posters, models and videos may be added. Using menus brought in from [employer], or printed from [employer], patients will apply the Pritikin dining out guidelines that were presented in the [employer] video. Patients will also be able to practice these guidelines in a variety of provided scenarios. The purpose of this lesson is to provide patients with the opportunity to practice hands-on learning of the Pritikin Dining Out guidelines with actual menus and practice scenarios.  Label Reading Clinical staff led group instruction and group discussion with PowerPoint  presentation and patient guidebook. To enhance the learning environment the use of posters, models and videos may be added. Patients will review and discuss the Pritikin label reading guidelines presented in Pritikin's Label Reading Educational series video. Using fool labels brought in from local grocery stores and markets, patients will apply the label reading guidelines and determine if the packaged food meet the Pritikin guidelines. The purpose of this lesson is to provide patients  with the opportunity to review, discuss, and practice hands-on learning of the Pritikin Label Reading guidelines with actual packaged food labels. Cooking School  Pritikin's [employer] are designed to teach patients ways to prepare quick, simple, and affordable recipes at home. The importance of nutrition's role in chronic disease risk reduction is reflected in its emphasis in the overall Pritikin program. By learning how to prepare essential core Pritikin Eating Plan recipes, patients will increase control over what they eat; be able to customize the flavor of foods without the use of added salt, sugar, or fat; and improve the quality of the food they consume. By learning a set of core recipes which are easily assembled, quickly prepared, and affordable, patients are more likely to prepare more healthy foods at home. These workshops focus on convenient breakfasts, simple entres, side dishes, and desserts which can be prepared with minimal effort and are consistent with nutrition recommendations for cardiovascular risk reduction. Cooking [employer] are taught by a Armed forces logistics/support/administrative officer (RD) who has been trained by the [employer]. The chef or RD has a clear understanding of the importance of minimizing - if not completely eliminating - added fat, sugar, and sodium in recipes. Throughout the series of Cooking School Workshop sessions, patients will learn about healthy ingredients and  efficient methods of cooking to build confidence in their capability to prepare    Cooking School weekly topics:  Adding Flavor- Sodium-Free  Fast and Healthy Breakfasts  Powerhouse Plant-Based Proteins  Satisfying Salads and Dressings  Simple Sides and Sauces  International Cuisine-Spotlight on the [employer] Zones  Delicious Desserts  Savory Soups  [employer] - Meals in a Astronomer Appetizers and Snacks  Comforting Weekend Breakfasts  One-Pot Wonders   Fast Evening Meals  Landscape architect Your Pritikin Plate  WORKSHOPS   Healthy Mindset (Psychosocial):  Focused Goals, Sustainable Changes Clinical staff led group instruction and group discussion with PowerPoint presentation and patient guidebook. To enhance the learning environment the use of posters, models and videos may be added. Patients will be able to apply effective goal setting strategies to establish at least one personal goal, and then take consistent, meaningful action toward that goal. They will learn to identify common barriers to achieving personal goals and develop strategies to overcome them. Patients will also gain an understanding of how our mind-set can impact our ability to achieve goals and the importance of cultivating a positive and growth-oriented mind-set. The purpose of this lesson is to provide patients with a deeper understanding of how to set and achieve personal goals, as well as the tools and strategies needed to overcome common obstacles which may arise along the way.  From Head to Heart: The Power of a Healthy Outlook  Clinical staff led group instruction and group discussion with PowerPoint presentation and patient guidebook. To enhance the learning environment the use of posters, models and videos may be added. Patients will be able to recognize and describe the impact of emotions and mood on physical health. They will discover the importance of self-care and explore self-care  practices which may work for them. Patients will also learn how to utilize the 4 C's to cultivate a healthier outlook and better manage stress and challenges. The purpose of this lesson is to demonstrate to patients how a healthy outlook is an essential part of maintaining good health, especially as they continue their cardiac rehab journey.  Healthy Sleep for a Healthy Heart Clinical staff  led group instruction and group discussion with PowerPoint presentation and patient guidebook. To enhance the learning environment the use of posters, models and videos may be added. At the conclusion of this workshop, patients will be able to demonstrate knowledge of the importance of sleep to overall health, well-being, and quality of life. They will understand the symptoms of, and treatments for, common sleep disorders. Patients will also be able to identify daytime and nighttime behaviors which impact sleep, and they will be able to apply these tools to help manage sleep-related challenges. The purpose of this lesson is to provide patients with a general overview of sleep and outline the importance of quality sleep. Patients will learn about a few of the most common sleep disorders. Patients will also be introduced to the concept of "sleep hygiene," and discover ways to self-manage certain sleeping problems through simple daily behavior changes. Finally, the workshop will motivate patients by clarifying the links between quality sleep and their goals of heart-healthy living.   Recognizing and Reducing Stress Clinical staff led group instruction and group discussion with PowerPoint presentation and patient guidebook. To enhance the learning environment the use of posters, models and videos may be added. At the conclusion of this workshop, patients will be able to understand the types of stress reactions, differentiate between acute and chronic stress, and recognize the impact that chronic stress has on their health. They  will also be able to apply different coping mechanisms, such as reframing negative self-talk. Patients will have the opportunity to practice a variety of stress management techniques, such as deep abdominal breathing, progressive muscle relaxation, and/or guided imagery.  The purpose of this lesson is to educate patients on the role of stress in their lives and to provide healthy techniques for coping with it.  Learning Barriers/Preferences:  Learning Barriers/Preferences - 05/24/23 1524       Learning Barriers/Preferences   Learning Barriers None;Exercise Concerns   Docie reports having left hip pain with walking after walking any extended distance and shortness of breath with exertion   Learning Preferences Skilled Demonstration;Video;Pictoral             Education Topics:  Knowledge Questionnaire Score:  Knowledge Questionnaire Score - 05/24/23 1442       Knowledge Questionnaire Score   Pre Score 19/24             Core Components/Risk Factors/Patient Goals at Admission:  Personal Goals and Risk Factors at Admission - 05/24/23 1403       Core Components/Risk Factors/Patient Goals on Admission    Weight Management Yes;Obesity;Weight Loss    Intervention Weight Management/Obesity: Establish reasonable short term and long term weight goals.;Obesity: Provide education and appropriate resources to help participant work on and attain dietary goals.    Admit Weight 313 lb 4.4 oz (142.1 kg)    Expected Outcomes Short Term: Continue to assess and modify interventions until short term weight is achieved;Long Term: Adherence to nutrition and physical activity/exercise program aimed toward attainment of established weight goal;Weight Loss: Understanding of general recommendations for a balanced deficit meal plan, which promotes 1-2 lb weight loss per week and includes a negative energy balance of [phone] kcal/d    Improve shortness of breath with ADL's Yes    Intervention Provide  education, individualized exercise plan and daily activity instruction to help decrease symptoms of SOB with activities of daily living.    Expected Outcomes Short Term: Improve cardiorespiratory fitness to achieve a reduction of symptoms when performing ADLs;Long Term:  Be able to perform more ADLs without symptoms or delay the onset of symptoms    Heart Failure Yes    Intervention Provide a combined exercise and nutrition program that is supplemented with education, support and counseling about heart failure. Directed toward relieving symptoms such as shortness of breath, decreased exercise tolerance, and extremity edema.    Expected Outcomes Improve functional capacity of life;Short term: Attendance in program 2-3 days a week with increased exercise capacity. Reported lower sodium intake. Reported increased fruit and vegetable intake. Reports medication compliance.;Short term: Daily weights obtained and reported for increase. Utilizing diuretic protocols set by physician.;Long term: Adoption of self-care skills and reduction of barriers for early signs and symptoms recognition and intervention leading to self-care maintenance.    Hypertension Yes    Intervention Provide education on lifestyle modifcations including regular physical activity/exercise, weight management, moderate sodium restriction and increased consumption of fresh fruit, vegetables, and low fat dairy, alcohol moderation, and smoking cessation.;Monitor prescription use compliance.    Expected Outcomes Short Term: Continued assessment and intervention until BP is < 140/80mm HG in hypertensive participants. < 130/78mm HG in hypertensive participants with diabetes, heart failure or chronic kidney disease.;Long Term: Maintenance of blood pressure at goal levels.    Stress Yes    Intervention Offer individual and/or small group education and counseling on adjustment to heart disease, stress management and health-related lifestyle change. Teach  and support self-help strategies.;Refer participants experiencing significant psychosocial distress to appropriate mental health specialists for further evaluation and treatment. When possible, include family members and significant others in education/counseling sessions.    Expected Outcomes Short Term: Participant demonstrates changes in health-related behavior, relaxation and other stress management skills, ability to obtain effective social support, and compliance with psychotropic medications if prescribed.;Long Term: Emotional wellbeing is indicated by absence of clinically significant psychosocial distress or social isolation.             Core Components/Risk Factors/Patient Goals Review:   Goals and Risk Factor Review     Row Name 05/30/23 1716             Core Components/Risk Factors/Patient Goals Review   Personal Goals Review Weight Management/Obesity;Improve shortness of breath with ADL's;Hypertension;Heart Failure;Stress       Review Adilen started cardiac rehab on 05/30/23. Deirdre did well with exercise. Vital signs were stable.       Expected Outcomes Lacretia will continue to participate in cardiac rehab for exercise, nutrition and lifestyle modifications                Core Components/Risk Factors/Patient Goals at Discharge (Final Review):   Goals and Risk Factor Review - 05/30/23 1716       Core Components/Risk Factors/Patient Goals Review   Personal Goals Review Weight Management/Obesity;Improve shortness of breath with ADL's;Hypertension;Heart Failure;Stress    Review Antanette started cardiac rehab on 05/30/23. Deirdre did well with exercise. Vital signs were stable.    Expected Outcomes Jaslene will continue to participate in cardiac rehab for exercise, nutrition and lifestyle modifications             ITP Comments:  ITP Comments     Row Name 05/24/23 1334 05/30/23 1713         ITP Comments Medical Director- Dr. Armanda Magic, MD. Introduction to  the Cardiac Rehab Program. Initial orientation folder reviewed. 30 Day ITP Review. Kaileen started cardiac rehab on 05/30/23. Weslyn did well with exercise.  Comments: Pt started cardiac rehab today.  Pt tolerated light exercise without difficulty. VSS, telemetry-Sinus rhythm, asymptomatic.  Medication list reconciled. Pt denies barriers to medicaiton compliance.  PSYCHOSOCIAL ASSESSMENT:  PHQ-16. Jessi is interested possibly another option for counseling and depression therapy. Will forward Dedre's quality of life questionnaire.  Pt oriented to exercise equipment and routine.    Understanding verbalized.

## 2023-06-01 ENCOUNTER — Ambulatory Visit (HOSPITAL_COMMUNITY): Payer: Medicaid Other

## 2023-06-01 ENCOUNTER — Encounter (HOSPITAL_COMMUNITY)
Admission: RE | Admit: 2023-06-01 | Discharge: 2023-06-01 | Disposition: A | Discharge status: Home / Self Care | Payer: Medicaid Other | Source: Ambulatory Visit | Attending: Cardiology | Admitting: Cardiology

## 2023-06-01 DIAGNOSIS — I5022 Chronic systolic (congestive) heart failure: Secondary | ICD-10-CM | POA: Diagnosis not present

## 2023-06-03 ENCOUNTER — Ambulatory Visit (HOSPITAL_COMMUNITY): Payer: Medicaid Other

## 2023-06-03 ENCOUNTER — Encounter (HOSPITAL_COMMUNITY)
Admission: RE | Admit: 2023-06-03 | Discharge: 2023-06-03 | Disposition: A | Discharge status: Home / Self Care | Payer: Medicaid Other | Source: Ambulatory Visit | Attending: Cardiology | Admitting: Cardiology

## 2023-06-03 DIAGNOSIS — I5022 Chronic systolic (congestive) heart failure: Secondary | ICD-10-CM | POA: Diagnosis not present

## 2023-06-06 ENCOUNTER — Ambulatory Visit (HOSPITAL_COMMUNITY): Payer: Medicaid Other

## 2023-06-06 ENCOUNTER — Encounter (HOSPITAL_COMMUNITY): Payer: Medicaid Other

## 2023-06-07 ENCOUNTER — Encounter: Payer: Self-pay | Admitting: Adult Health

## 2023-06-07 ENCOUNTER — Telehealth: Payer: Medicaid Other | Admitting: Adult Health

## 2023-06-07 DIAGNOSIS — F339 Major depressive disorder, recurrent, unspecified: Secondary | ICD-10-CM | POA: Diagnosis not present

## 2023-06-07 DIAGNOSIS — F909 Attention-deficit hyperactivity disorder, unspecified type: Secondary | ICD-10-CM

## 2023-06-07 DIAGNOSIS — F411 Generalized anxiety disorder: Secondary | ICD-10-CM

## 2023-06-07 DIAGNOSIS — G4700 Insomnia, unspecified: Secondary | ICD-10-CM | POA: Diagnosis not present

## 2023-06-07 DIAGNOSIS — F332 Major depressive disorder, recurrent severe without psychotic features: Secondary | ICD-10-CM

## 2023-06-07 MED ORDER — LISDEXAMFETAMINE DIMESYLATE 50 MG PO CAPS: 50.0000 mg | ORAL_CAPSULE | Freq: Every day | ORAL | 0 refills | Status: DC

## 2023-06-07 MED ORDER — DULOXETINE HCL 60 MG PO CPEP: 60.0000 mg | ORAL_CAPSULE | Freq: Every day | ORAL | 5 refills | Status: DC

## 2023-06-07 MED ORDER — DULOXETINE HCL 30 MG PO CPEP
30.0000 mg | ORAL_CAPSULE | Freq: Every day | ORAL | 5 refills | Status: DC
Start: 2023-06-07 — End: 2023-12-01

## 2023-06-07 MED ORDER — ALPRAZOLAM 1 MG PO TABS: 1.0000 mg | ORAL_TABLET | Freq: Two times a day (BID) | ORAL | 2 refills | Status: DC | PRN

## 2023-06-07 MED ORDER — TRAZODONE HCL 50 MG PO TABS: 50.0000 mg | ORAL_TABLET | Freq: Every day | ORAL | 5 refills | Status: DC

## 2023-06-07 NOTE — Progress Notes (Signed)
 Samantha Romero [id number] 1976-02-18 48 y.o.  Virtual Visit via Video Note  I connected with pt @ on 06/07/23 at  9:30 AM EDT by a video enabled telemedicine application and verified that I am speaking with the correct person using two identifiers.   I discussed the limitations of evaluation and management by telemedicine and the availability of in person appointments. The patient expressed understanding and agreed to proceed.  I discussed the assessment and treatment plan with the patient. The patient was provided an opportunity to ask questions and all were answered. The patient agreed with the plan and demonstrated an understanding of the instructions.   The patient was advised to call back or seek an in-person evaluation if the symptoms worsen or if the condition fails to improve as anticipated.  I provided 25 minutes of non-face-to-face time during this encounter.  The patient was located at home.  The provider was located at [facility].   Dorothyann Gibbs, NP   Subjective:   Patient ID:  Samantha Romero is a 48 y.o. (DOB 08-23-1975) female.  Chief Complaint: No chief complaint on file.   HPI Gwendalynn Winn Jock presents for follow-up of MDD, GAD, ADHD, and insomnia.  Describes mood today as "up and down". Pleasant. Tearful at times. Mood symptoms - reports depression and anxiety. Reports varying interest and motivation - "doing more things". Reports decreased irritability - "not a lot". Reports panic attacks - 2 to 3 times a week - "less frequent". Reports decreased social anxiety - "pushing herself more". Reports getting better about responding to friends. Reports worry, rumination and over thinking. Reports some obsessive thoughts and acts. Reports mood has improved. Started a cardiovascular rehabilitation group with [facility]. Reports she is finding the program helpful. Stating "I think I am making some progress". Reports taking medications consistently and feel like they  are helpful. Working with a Paramedic. Energy levels improved. Active, does not have a regular exercise routine.   Enjoys some usual interests and activities. Living with mother. Involved in a long term relationship. Has family local - mother, brother, and extended family. Spending time with family.  Appetite adequate. Reports weight loss. Reports sleeping better some nights than others. Averages 7 hours. Reports focus and concentration improved - taking Vyvanse 50mg  daily. Completing tasks. Managing minimal aspects of household. Continues to look for employment. Denies SI or HI.  Denies AH or VH. Denies self harm. Denies substance use.  Previous medication trials: Topamax   Review of Systems:  Review of Systems  Musculoskeletal:  Negative for gait problem.  Neurological:  Negative for tremors.  Psychiatric/Behavioral:         Please refer to HPI    Medications: I have reviewed the patient's current medications.  Current Outpatient Medications  Medication Sig Dispense Refill   albuterol (PROVENTIL) (2.5 MG/3ML) 0.083% nebulizer solution Take 3 mLs (2.5 mg total) by nebulization every 6 (six) hours as needed for wheezing or shortness of breath. 150 mL 1   albuterol (VENTOLIN HFA) 108 (90 Base) MCG/ACT inhaler Inhale 2 puffs into the lungs every 6 (six) hours as needed for wheezing or shortness of breath. 8 g 2   ALPRAZolam (XANAX) 1 MG tablet Take 1 tablet (1 mg total) by mouth 2 (two) times daily as needed for anxiety. 60 tablet 2   azelastine (ASTELIN) 0.1 % nasal spray Place 2 sprays into both nostrils 2 (two) times daily. (Patient not taking: Reported on 04/28/2023) 30 mL 5   buPROPion (  WELLBUTRIN XL) 150 MG 24 hr tablet Take 3 tablets (450 mg total) by mouth daily. 90 tablet 5   carvedilol (COREG) 25 MG tablet TAKE 1 TABLET (25 MG TOTAL) BY MOUTH TWICE A DAY WITH MEALS 180 tablet 3   DULoxetine (CYMBALTA) 30 MG capsule Take 1 capsule (30 mg total) by mouth daily. 30 capsule 0    DULoxetine (CYMBALTA) 60 MG capsule Take 1 capsule (60 mg total) by mouth daily. 30 capsule 0   empagliflozin (JARDIANCE) 10 MG TABS tablet TAKE ONE TABLET BY MOUTH DAILY BEFORE BREAKFAST 90 tablet 3   Fluticasone-Salmeterol 232-14 MCG/ACT AEPB Inhale 1 puff into the lungs in the morning and at bedtime. (Patient not taking: Reported on 05/24/2023) 1 each 5   furosemide (LASIX) 40 MG tablet Take 1 tablet (40 mg total) by mouth daily. You may take 1 extra tablet (40 mg total) by mouth daily as needed for lower extremity swelling/weight gain. 105 tablet 2   levocetirizine (XYZAL) 5 MG tablet TAKE 1 TABLET BY MOUTH EVERY DAY IN THE EVENING 90 tablet 0   levonorgestrel (MIRENA, 52 MG,) 20 MCG/DAY IUD      lisdexamfetamine (VYVANSE) 50 MG capsule Take 1 capsule (50 mg total) by mouth daily. 30 capsule 0   meloxicam (MOBIC) 15 MG tablet TAKE 1 TABLET BY MOUTH EVERY DAY AS NEEDED FOR PAIN (Patient not taking: Reported on 05/24/2023) 30 tablet 0   montelukast (SINGULAIR) 10 MG tablet TAKE 1 TABLET BY MOUTH EVERYDAY AT BEDTIME 90 tablet 2   omeprazole (PRILOSEC) 40 MG capsule TAKE 1 CAPSULE BY MOUTH EVERY DAY (Patient not taking: Reported on 03/24/2022) 90 capsule 1   sacubitril-valsartan (ENTRESTO) 97-103 MG Take 1 tablet by mouth 2 (two) times daily. 60 tablet 11   spironolactone (ALDACTONE) 25 MG tablet Take 1 tablet (25 mg total) by mouth daily. 90 tablet 3   traZODone (DESYREL) 50 MG tablet Take 1 tablet (50 mg total) by mouth at bedtime. 30 tablet 5   Vitamin D, Ergocalciferol, (DRISDOL) 1.25 MG (50000 UNIT) CAPS capsule Take 50,000 Units by mouth once a week. (Patient not taking: Reported on 05/24/2023)     No current facility-administered medications for this visit.    Medication Side Effects: None  Allergies:  Allergies  Allergen Reactions   Grass Extracts [Gramineae Pollens]     Nasal congestion, post nasal drip    Shellfish Allergy Itching    In throat only.    Past Medical History:   Diagnosis Date   Anxiety    panic attacks   Asthma    CHF (congestive heart failure), NYHA class II, acute, systolic (HCC) 10/06/2018   Depression    Fibromyalgia    Hypertension    no meds currently   No pertinent past medical history    Pneumonia    hx of    Sinus problem     Family History  Problem Relation Age of Onset   Ulcers Mother    Hypertension Mother    Cancer Maternal Uncle        lung   Cancer Maternal Grandfather        lung and back    Social History   Socioeconomic History   Marital status: Single    Spouse name: Not on file   Number of children: 0   Years of education: 16   Highest education level: Bachelor's degree (e.g., BA, AB, BS)  Occupational History   Not on file  Tobacco Use  Smoking status: Never   Smokeless tobacco: Never  Vaping Use   Vaping status: Never Used  Substance and Sexual Activity   Alcohol use: Never   Drug use: Never   Sexual activity: Not Currently    Birth control/protection: I.U.D.    Comment: Mirena  Other Topics Concern   Not on file  Social History Narrative   Not on file   Social Drivers of Health   Financial Resource Strain: Not on file  Food Insecurity: Not on file  Transportation Needs: Not on file  Physical Activity: Not on file  Stress: Not on file  Social Connections: Not on file  Intimate Partner Violence: Not on file    Past Medical History, Surgical history, Social history, and Family history were reviewed and updated as appropriate.   Please see review of systems for further details on the patient's review from today.   Objective:   Physical Exam:  There were no vitals taken for this visit.  Physical Exam Constitutional:      General: She is not in acute distress. Musculoskeletal:        General: No deformity.  Neurological:     Mental Status: She is alert and oriented to person, place, and time.     Coordination: Coordination normal.  Psychiatric:        Attention and  Perception: Attention and perception normal. She does not perceive auditory or visual hallucinations.        Mood and Affect: Affect is not labile, blunt, angry or inappropriate.        Speech: Speech normal.        Behavior: Behavior normal.        Thought Content: Thought content normal. Thought content is not paranoid or delusional. Thought content does not include homicidal or suicidal ideation. Thought content does not include homicidal or suicidal plan.        Cognition and Memory: Cognition and memory normal.        Judgment: Judgment normal.     Comments: Insight intact     Lab Review:     Component Value Date/Time   NA 145 (H) 04/14/2022 1438   K 3.5 04/14/2022 1438   CL 106 04/14/2022 1438   CO2 24 04/14/2022 1438   GLUCOSE 84 04/14/2022 1438   GLUCOSE 93 10/08/2020 2012   BUN 9 04/14/2022 1438   CREATININE 0.72 04/14/2022 1438   CALCIUM 9.2 04/14/2022 1438   PROT 6.6 04/14/2022 1438   ALBUMIN 4.1 04/14/2022 1438   AST 32 04/14/2022 1438   ALT 62 (H) 04/14/2022 1438   ALKPHOS 135 (H) 04/14/2022 1438   BILITOT 0.8 04/14/2022 1438   GFRNONAA >60 10/08/2020 2012   GFRAA 115 05/08/2020 1654       Component Value Date/Time   WBC 6.7 04/14/2022 1438   WBC 8.2 10/08/2020 2012   RBC 4.61 04/14/2022 1438   RBC 4.89 10/08/2020 2012   HGB 12.8 04/14/2022 1438   HCT 40.3 04/14/2022 1438   PLT 272 04/14/2022 1438   MCV 87 04/14/2022 1438   MCH 27.8 04/14/2022 1438   MCH 27.6 10/08/2020 2012   MCHC 31.8 04/14/2022 1438   MCHC 30.8 10/08/2020 2012   RDW 12.9 04/14/2022 1438   LYMPHSABS 2.3 04/14/2022 1438   MONOABS 0.6 10/08/2020 2012   EOSABS 0.1 04/14/2022 1438   BASOSABS 0.0 04/14/2022 1438    No results found for: "POCLITH", "LITHIUM"   No results found for: "PHENYTOIN", "PHENOBARB", "VALPROATE", "CBMZ"   .  res Assessment: Plan:    Plan:  Patient currently out of work and is totally disabled. Out of work 04/05/2022 through 07/05/2022.  Seeing therapist -  Timmothy Sours - [facility]  PDMP reviewed  Trazadone 50mg  at hs as needed   Cymbalta 60/30 daily Wellbutrin XL 150mg  - 3 daily Xanax 1mg  up to twice a day - usually taking it once to twice a week Vyvanse 50mg  daily  RTC 6 weeks  25 minutes spent dedicated to the care of this patient on the date of this encounter to include pre-visit review of records, ordering of medication, post visit documentation, and face-to-face time with the patient discussing MDD, GAD, ADHD, and insomnia. Discussed continuing current medication regimen.  Patient advised to contact office with any questions, adverse effects, or acute worsening in signs and symptoms.   Discussed potential benefits, risk, and side effects of benzodiazepines to include potential risk of tolerance and dependence, as well as possible drowsiness. Advised patient not to drive if experiencing drowsiness and to take lowest possible effective dose to minimize risk of dependence and tolerance.   Discussed potential benefits, risks, and side effects of stimulants with patient to include increased heart rate, palpitations, insomnia, increased anxiety, increased irritability, or decreased appetite.  Instructed patient to contact office if experiencing any significant tolerability issues.    There are no diagnoses linked to this encounter.   Please see After Visit Summary for patient specific instructions.  Future Appointments  Date Time Provider Department Center  06/07/2023  9:30 AM Rachel Samples, Thereasa Solo, NP [facility] None  06/08/2023  1:45 PM [facility] PHASE II EXC [facility] None  06/10/2023  1:45 PM [facility] PHASE II EXC [facility] None  06/13/2023  1:45 PM [facility] PHASE II EXC [facility] None  06/15/2023  1:45 PM [facility] PHASE II EXC [facility] None  06/17/2023  1:45 PM [facility] PHASE II EXC [facility] None  06/17/2023  3:20 PM Olive Bass, FNP [facility]  06/20/2023 11:20 AM Louanne Skye, Devoria Albe., NP [facility]  06/20/2023  1:45  PM [facility] PHASE II EXC [facility] None  06/22/2023  1:45 PM [facility] PHASE II EXC [facility] None  06/24/2023  1:45 PM [facility] PHASE II EXC [facility] None  06/27/2023  1:45 PM [facility] PHASE II EXC [facility] None  06/29/2023  1:45 PM [facility] PHASE II EXC [facility] None  07/01/2023  1:45 PM [facility] PHASE II EXC [facility] None  07/04/2023  1:45 PM [facility] PHASE II EXC [facility] None  07/06/2023  1:45 PM [facility] PHASE II EXC [facility] None  07/08/2023  1:45 PM [facility] PHASE II EXC [facility] None  07/11/2023  1:45 PM [facility] PHASE II EXC [facility] None  07/13/2023  1:45 PM [facility] PHASE II EXC [facility] None  07/15/2023  1:45 PM [facility] PHASE II EXC [facility] None  07/18/2023  1:45 PM [facility] PHASE II EXC [facility] None  07/20/2023  1:45 PM [facility] PHASE II EXC [facility] None  07/22/2023  1:45 PM [facility] PHASE II EXC [facility] None  07/25/2023  1:45 PM [facility] PHASE II EXC [facility] None  07/27/2023  1:45 PM [facility] PHASE II EXC [facility] None  07/29/2023  1:45 PM [facility] PHASE II EXC [facility] None  08/01/2023  1:45 PM [facility] PHASE II EXC [facility] None  08/03/2023  1:45 PM [facility] PHASE II EXC [facility] None  08/05/2023  1:45 PM [facility] PHASE II EXC [facility] None  08/08/2023  1:45 PM [facility] PHASE II EXC [facility] None  08/10/2023  1:45 PM [facility] PHASE II EXC [facility] None  08/12/2023  1:45 PM [facility] PHASE II EXC [facility] None  08/15/2023  1:45 PM [facility] PHASE II EXC [facility] None  08/17/2023  1:45 PM [facility] PHASE II EXC [facility] None    No orders of the defined types were placed in this encounter.     -------------------------------

## 2023-06-08 ENCOUNTER — Encounter (HOSPITAL_COMMUNITY)
Admission: RE | Admit: 2023-06-08 | Discharge: 2023-06-08 | Disposition: A | Discharge status: Home / Self Care | Payer: Medicaid Other | Source: Ambulatory Visit | Attending: Cardiology

## 2023-06-08 ENCOUNTER — Ambulatory Visit (HOSPITAL_COMMUNITY): Payer: Medicaid Other

## 2023-06-08 DIAGNOSIS — I5022 Chronic systolic (congestive) heart failure: Secondary | ICD-10-CM

## 2023-06-10 ENCOUNTER — Encounter (HOSPITAL_COMMUNITY)
Admission: RE | Admit: 2023-06-10 | Discharge: 2023-06-10 | Disposition: A | Discharge status: Home / Self Care | Payer: Medicaid Other | Source: Ambulatory Visit | Attending: Cardiology | Admitting: Cardiology

## 2023-06-10 ENCOUNTER — Ambulatory Visit (HOSPITAL_COMMUNITY): Payer: Medicaid Other

## 2023-06-10 DIAGNOSIS — I5022 Chronic systolic (congestive) heart failure: Secondary | ICD-10-CM | POA: Diagnosis not present

## 2023-06-13 ENCOUNTER — Ambulatory Visit (HOSPITAL_COMMUNITY): Payer: Medicaid Other

## 2023-06-13 ENCOUNTER — Encounter (HOSPITAL_COMMUNITY): Payer: Medicaid Other

## 2023-06-13 ENCOUNTER — Telehealth (HOSPITAL_COMMUNITY): Payer: Self-pay | Admitting: *Deleted

## 2023-06-13 NOTE — Telephone Encounter (Signed)
 Samantha Romero called out today for cardiac rehab due to not feeling well.  Having a flare with her Asthma Used her inhaler over the weekend more than usual. Wants to take it easy today.  Plan to return on Wednesday. Alanson Aly, BSN Cardiac and Emergency planning/management officer

## 2023-06-15 ENCOUNTER — Encounter (HOSPITAL_COMMUNITY): Admission: RE | Admit: 2023-06-15 | Payer: Medicaid Other | Source: Ambulatory Visit

## 2023-06-15 ENCOUNTER — Ambulatory Visit (HOSPITAL_COMMUNITY): Payer: Medicaid Other

## 2023-06-15 ENCOUNTER — Other Ambulatory Visit: Payer: Self-pay | Admitting: Adult Health

## 2023-06-16 ENCOUNTER — Telehealth (HOSPITAL_COMMUNITY): Payer: Self-pay | Admitting: *Deleted

## 2023-06-16 NOTE — Telephone Encounter (Signed)
 Left message to call cardiac rehab. Called to see how Daphene is doing as she did not feel well at the beginning of the week.Thayer Headings RN BSN

## 2023-06-17 ENCOUNTER — Ambulatory Visit (HOSPITAL_COMMUNITY): Payer: Medicaid Other

## 2023-06-17 ENCOUNTER — Ambulatory Visit: Payer: Medicaid Other | Admitting: Family

## 2023-06-17 ENCOUNTER — Encounter (HOSPITAL_COMMUNITY): Payer: Medicaid Other

## 2023-06-17 VITALS — BP 136/90 | HR 102 | Temp 98.0°F | Resp 20 | Ht 62.0 in | Wt 306.4 lb

## 2023-06-17 DIAGNOSIS — I10 Essential (primary) hypertension: Secondary | ICD-10-CM

## 2023-06-17 DIAGNOSIS — K219 Gastro-esophageal reflux disease without esophagitis: Secondary | ICD-10-CM

## 2023-06-17 DIAGNOSIS — I5021 Acute systolic (congestive) heart failure: Secondary | ICD-10-CM

## 2023-06-17 DIAGNOSIS — F418 Other specified anxiety disorders: Secondary | ICD-10-CM

## 2023-06-17 DIAGNOSIS — J4540 Moderate persistent asthma, uncomplicated: Secondary | ICD-10-CM

## 2023-06-17 MED ORDER — OMEPRAZOLE 40 MG PO CPDR: DELAYED_RELEASE_CAPSULE | ORAL | 3 refills | Status: AC

## 2023-06-17 MED ORDER — MONTELUKAST SODIUM 10 MG PO TABS: ORAL_TABLET | ORAL | 3 refills | Status: AC

## 2023-06-17 MED ORDER — FLUTICASONE-SALMETEROL 232-14 MCG/ACT IN AEPB
1.0000 | INHALATION_SPRAY | Freq: Two times a day (BID) | RESPIRATORY_TRACT | 5 refills | Status: DC
Start: 2023-06-17 — End: 2023-11-01

## 2023-06-17 MED ORDER — AZELASTINE HCL 0.1 % NA SOLN
2.0000 | Freq: Two times a day (BID) | NASAL | 3 refills | Status: AC
Start: 2023-06-17 — End: ?

## 2023-06-17 MED ORDER — LEVOCETIRIZINE DIHYDROCHLORIDE 5 MG PO TABS: 5.0000 mg | ORAL_TABLET | Freq: Every evening | ORAL | 3 refills | Status: AC

## 2023-06-17 NOTE — Progress Notes (Signed)
 Samantha Romero is a 48 y.o. female with the following history as recorded in [employer]:  Patient Active Problem List   Diagnosis Date Noted   HFrEF (heart failure with reduced ejection fraction) (HCC) 12/29/2022   OSA on CPAP 12/29/2022   Morbid obesity (HCC) 12/29/2022   Mixed anxiety and depressive disorder 07/12/2022   Left hip pain 09/30/2020   Environmental and seasonal allergies 07/24/2019   Asthma 10/06/2018   Hypertensive urgency 10/06/2018   GERD (gastroesophageal reflux disease) 10/06/2018   Depression with anxiety 10/06/2018   CHF (congestive heart failure), NYHA class II, acute, systolic (HCC) 10/06/2018   Shortness of breath    Cardiomegaly    Snoring 10/04/2018   Essential hypertension 08/14/2018   Moderate persistent asthma with acute exacerbation 08/14/2018   Fibromyalgia 11/18/2016   Class 3 severe obesity due to excess calories without serious comorbidity with body mass index (BMI) of 45.0 to 49.9 in adult (HCC) 11/18/2016   Adjustment reaction with anxiety and depression 11/18/2016   Chronic bilateral low back pain without sciatica 09/22/2015   Myofascial pain 09/22/2015   Lapband port leaking-REVISED May 2013 09/10/2011   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 150 mL 1   albuterol (VENTOLIN HFA) 108 (90 Base) MCG/ACT inhaler Inhale 2 puffs into the lungs every 6 (six) hours as needed for wheezing or shortness of breath. 8 g 2   ALPRAZolam (XANAX) 1 MG tablet Take 1 tablet (1 mg total) by mouth 2 (two) times daily as needed for anxiety. 60 tablet 2   buPROPion (WELLBUTRIN XL) 150 MG 24 hr tablet TAKE 3 TABLETS BY MOUTH DAILY. 270 tablet 0   carvedilol (COREG) 25 MG tablet TAKE 1 TABLET (25 MG TOTAL) BY MOUTH TWICE A DAY WITH MEALS 180  tablet 3   DULoxetine (CYMBALTA) 30 MG capsule Take 1 capsule (30 mg total) by mouth daily. 30 capsule 5   DULoxetine (CYMBALTA) 60 MG capsule Take 1 capsule (60 mg total) by mouth daily. 30 capsule 5   empagliflozin (JARDIANCE) 10 MG TABS tablet TAKE ONE TABLET BY MOUTH DAILY BEFORE BREAKFAST 90 tablet 3   furosemide (LASIX) 40 MG tablet Take 1 tablet (40 mg total) by mouth daily. You may take 1 extra tablet (40 mg total) by mouth daily as needed for lower extremity swelling/weight gain. 105 tablet 2   levonorgestrel (MIRENA, 52 MG,) 20 MCG/DAY IUD      lisdexamfetamine (VYVANSE) 50 MG capsule Take 1 capsule (50 mg total) by mouth daily. 30 capsule 0   [START ON 07/05/2023] lisdexamfetamine (VYVANSE) 50 MG capsule Take 1 capsule (50 mg total) by mouth daily. 30 capsule 0   sacubitril-valsartan (ENTRESTO) 97-103 MG Take 1 tablet by mouth 2 (two) times daily. 60 tablet 11   spironolactone (ALDACTONE) 25 MG tablet Take 1 tablet (25 mg total) by mouth daily. 90 tablet 3   traZODone (DESYREL) 50 MG tablet Take 1 tablet (50 mg total) by mouth at bedtime. 30 tablet 5   azelastine (ASTELIN) 0.1 % nasal spray Place 2 sprays into both nostrils 2 (two) times daily. 90 mL 3   Fluticasone-Salmeterol 232-14 MCG/ACT AEPB Inhale 1 puff into the lungs in the morning and at bedtime. 1 each 5   levocetirizine (XYZAL) 5 MG tablet Take  1 tablet (5 mg total) by mouth every evening. 90 tablet 3   montelukast (SINGULAIR) 10 MG tablet TAKE 1 TABLET BY MOUTH EVERYDAY AT BEDTIME 90 tablet 3   omeprazole (PRILOSEC) 40 MG capsule TAKE 1 CAPSULE BY MOUTH EVERY DAY 90 capsule 3   No current facility-administered medications for this visit.    Allergies: Grass extracts [gramineae pollens] and Shellfish allergy  Past Medical History:  Diagnosis Date   Anxiety    panic attacks   Asthma    CHF (congestive heart failure), NYHA class II, acute, systolic (HCC) 10/06/2018   Depression    Fibromyalgia    Hypertension    no meds  currently   No pertinent past medical history    Pneumonia    hx of    Sinus problem     Past Surgical History:  Procedure Laterality Date   ESOPHAGOGASTRODUODENOSCOPY N/A 11/26/2014   Procedure: ESOPHAGOGASTRODUODENOSCOPY (EGD);  Surgeon: Ovidio Kin, MD;  Location: [facility];  Service: General;  Laterality: N/A;   GASTRIC BANDING PORT REVISION  08/24/11   LAPAROSCOPIC GASTRIC BANDING  12/26/2007   LAPAROSCOPIC LYSIS OF ADHESIONS N/A 01/06/2015   Procedure: LAPAROSCOPIC LYSIS OF ADHESIONS;  Surgeon: Luretha Murphy, MD;  Location: [facility];  Service: General;  Laterality: N/A;   LAPAROSCOPIC REPAIR AND REMOVAL OF GASTRIC BAND N/A 01/06/2015   Procedure: LAPAROSCOPIC RESIGHTING OF GASTRIC Band PORT;  Surgeon: Luretha Murphy, MD;  Location: [facility];  Service: General;  Laterality: N/A;   NO PAST SURGERIES      Family History  Problem Relation Age of Onset   Ulcers Mother    Hypertension Mother    Cancer Maternal Uncle        lung   Cancer Maternal Grandfather        lung and back    Social History   Tobacco Use   Smoking status: Never   Smokeless tobacco: Never  Substance Use Topics   Alcohol use: Never    Subjective:   Presents today as a new patient; transferring primary care provider to location closer to home in [facility];  Working with cardiology for heart failure program- excited about potential with this program to help her feel safer in her healthcare/ cardiac needs;  GYN- [facility];  Does currently have psychiatrist but considering to transfer to different provider as well;  Does need refills today;   Objective:  Vitals:   06/17/23 1519  BP: (!) 136/90  Pulse: (!) 102  Resp: 20  Temp: 98 F (36.7 C)  TempSrc: Oral  SpO2: 98%  Weight: (!) 306 lb 6.4 oz (139 kg)  Height: 5\' 2"  (1.575 m)    General: Well developed, well nourished, in no acute distress  Skin : Warm and dry.  Head: Normocephalic and atraumatic  Lungs: Respirations unlabored;  clear to auscultation bilaterally without wheeze, rales, rhonchi  CVS exam: normal rate and regular rhythm.  Neurologic: Alert and oriented; speech intact; face symmetrical; moves all extremities well; CNII-XII intact without focal deficit   Assessment:  1. CHF (congestive heart failure), NYHA class II, acute, systolic (HCC)   2. Moderate persistent asthma without complication   3. Gastroesophageal reflux disease without esophagitis   4. Essential hypertension   5. Depression with anxiety     Plan:  Under care of cardiology- continue regular follow up there as scheduled; Stable; refills updated; patient does not feel she needs to be referred to Asthma/ allergy at this time but will let us  know if she changes her mind; Stable; refill updated; Under care of cardiology; Does currently have psychiatrist/ therapist- she will call back if she needs help finding additional resources;   Return in about 6 months (around 12/18/2023) for follow up.  No orders of the defined types were placed in this encounter.   Requested Prescriptions   Signed Prescriptions Disp Refills   azelastine (ASTELIN) 0.1 % nasal spray 90 mL 3    Sig: Place 2 sprays into both nostrils 2 (two) times daily.   Fluticasone-Salmeterol 232-14 MCG/ACT AEPB 1 each 5    Sig: Inhale 1 puff into the lungs in the morning and at bedtime.   levocetirizine (XYZAL) 5 MG tablet 90 tablet 3    Sig: Take 1 tablet (5 mg total) by mouth every evening.   montelukast (SINGULAIR) 10 MG tablet 90 tablet 3    Sig: TAKE 1 TABLET BY MOUTH EVERYDAY AT BEDTIME   omeprazole (PRILOSEC) 40 MG capsule 90 capsule 3    Sig: TAKE 1 CAPSULE BY MOUTH EVERY DAY

## 2023-06-18 NOTE — Progress Notes (Unsigned)
 Cardiology Office Note    Patient Name: Samantha Romero Date of Encounter: 06/20/2023  Primary Care Provider:  Olive Bass, FNP Primary Cardiologist:  Samantha Noe, MD (Inactive) Primary Electrophysiologist: None   Past Medical History    Past Medical History:  Diagnosis Date   Anxiety    panic attacks   Asthma    CHF (congestive heart failure), NYHA class II, acute, systolic (HCC) 10/06/2018   Depression    Fibromyalgia    Hypertension    no meds currently   No pertinent past medical history    Pneumonia    hx of    Sinus problem     History of Present Illness  Samantha Romero is a 48 y.o. female with a PMH of HFrEF, NICM, HLD, HTN, GERD, anxiety, OSA (on CPAP), asthma, obesity s/p bariatric surgery 2009 who presents today for 51-month follow-up.  Samantha Romero was initially referred to Samantha Romero in 2020 for management of CHF.  She had a 2D echo completed in 09/2018 that showed EF of 35-40% and was started on GDMT.  Repeat 2D echo in 2021 that showed decreased EF to 30%.  She had continued titration of GDMT and 2D echo was repeated in 09/2021 with improved EF of 60 to 65% and no RWMA with normal RV and no significant valve abnormalities.  She was seen by Samantha Romero to reestablish care on 04/28/2023 and reported that her improvement in EF was due to medication adherence.  She also noted significant stress leading to panic attacks and anxiety and was concerned about her heart health.  She was referred to cardiac rehab with current medications refilled.  Samantha Romero presents today for 71-month follow-up.  During today's visit her blood pressures were elevated at 158/82 and 148/98 on recheck.  Her home blood pressure readings have been stable, and she participates in a cardiac rehabilitation program where her blood pressure is monitored three times a week. Her blood pressure tends to rise after exercise but normalizes shortly after. She is on medications including Jardiance, Entresto,  and spironolactone but is inconsistent with her regimen, missing doses about three times a week. She finds managing her medications overwhelming and is considering using a smaller pill organizer to improve adherence. She is currently stable with no symptoms of heart failure exacerbation. 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 She is trying various methods to manage her stress, including reading, listening to podcasts, and walking when possible. She is also seeking a therapist for more structured guidance. Her diet has changed significantly since her diagnosis, eliminating soda, candy, and cookies, and opting for more fruits and healthier options. She reports feeling fuller and more satisfied with her meals. She allows herself a treat every two weeks but acknowledges the need to balance this with consistent medication use. Patient denies chest pain, palpitations, dyspnea, PND, orthopnea, nausea, vomiting, dizziness, syncope, edema, weight gain, or early satiety.  Discussed the use of AI scribe software for clinical note transcription with the patient, who gave verbal consent to proceed.  History of Present Illness    Review of Systems  Please see the history of present illness.    All other systems reviewed and are otherwise negative except as noted above.  Physical Exam    Wt Readings from Last 3 Encounters:  06/20/23 (!) 306 lb 9.6 oz (139.1 kg)  06/17/23 (!) 306 lb 6.4 oz (139 kg)  05/24/23 (!) 313 lb 4.4  oz (142.1 kg)   VS: Vitals:   06/20/23 1130 06/20/23 1228  BP: (!) 158/82 (!) 148/98  Pulse: (!) 112   Resp: 16   SpO2: 98%   ,Body mass index is 56.08 kg/m. GEN: Well nourished, well developed in no acute distress Neck: No JVD; No carotid bruits Pulmonary: Clear to auscultation without rales, wheezing or rhonchi  Cardiovascular: Normal rate. Regular rhythm. Normal S1. Normal S2.   Murmurs: There is no murmur.  ABDOMEN: Soft, non-tender,  non-distended EXTREMITIES:  No edema; No deformity   EKG/LABS/ Recent Cardiac Studies   ECG personally reviewed by me today -none completed today  Risk Assessment/Calculations:          Lab Results  Component Value Date   WBC 6.7 04/14/2022   HGB 12.8 04/14/2022   HCT 40.3 04/14/2022   MCV 87 04/14/2022   PLT 272 04/14/2022   Lab Results  Component Value Date   CREATININE 0.72 04/14/2022   BUN 9 04/14/2022   NA 145 (H) 04/14/2022   K 3.5 04/14/2022   CL 106 04/14/2022   CO2 24 04/14/2022   Lab Results  Component Value Date   CHOL 158 11/19/2019   HDL 50 11/19/2019   LDLCALC 91 11/19/2019   TRIG 93 11/19/2019   CHOLHDL 3.2 11/19/2019    Lab Results  Component Value Date   HGBA1C 5.9 (H) 01/06/2022   Assessment & Plan    1.  HFrEF/NICM: -2D echo completed 09/2018 with EF of 35 as 40% and repeat echo 2021 with reduced EF of 30% in the setting of noncompliance with GDMT.  Patient's most recent 2D echo completed on 09/2021 improved EF of 60-65% with no RWMA -Patient reports inconsistency with medication regimen due to feeling overwhelmed with pill burden -Patient was provided a pillbox in office today to assist with pill organization - Order echocardiogram in May 2025 to assess heart function. -Continue carvedilol 25 mg twice daily, Jardiance 10 mg daily, Lasix 40 mg daily, Entresto 97/103 mg twice daily, spironolactone 25 mg daily -Low sodium diet, fluid restriction <2L, and daily weights encouraged. Educated to contact our office for weight gain of 2 lbs overnight or 5 lbs in one week.   2.  Essential hypertension: -HYPERTENSION CONTROL Vitals:   06/20/23 1130 06/20/23 1228  BP: (!) 158/82 (!) 148/98    The patient's blood pressure is elevated above target today.  In order to address the patient's elevated BP: Blood pressure will be monitored at home to determine if medication changes need to be made.; Follow up with general cardiology has been recommended.    Patient reports inconsistency with BP medication regimen -Continue carvedilol 25 mg twice daily, Entresto 97/103 mg daily, spironolactone 25 mg daily -Patient will monitor BP over the next 2 weeks and if elevated will consider medication adjustment.  3.  History of OSA: -Reports compliance with CPAP.  4.  Morbid obesity: -s/p lap band procedure in 2009 with current BMI of 56.08 kg/m - Refer to clinical pharmacist to discuss weight loss medications. - Encourage continuation of cardiac rehab and dietary changes.  5. Anxiety and Stress: Ongoing stress and anxiety exacerbated by work. Inconsistent adherence to Wellbutrin 450 mg. Seeking therapist or psychiatrist. - Encourage finding a therapist or psychiatrist for cognitive behavioral therapy. - Discuss the importance of consistent medication adherence for anxiety management.  Disposition: Follow-up with Samantha Noe, MD (Inactive) or APP in 3 months    Signed, Napoleon Form, Leodis Rains, NP 06/20/2023,  1:46 PM [facility]

## 2023-06-20 ENCOUNTER — Telehealth (HOSPITAL_COMMUNITY): Payer: Self-pay | Admitting: *Deleted

## 2023-06-20 ENCOUNTER — Encounter: Payer: Self-pay | Admitting: Nurse Practitioner

## 2023-06-20 ENCOUNTER — Ambulatory Visit: Payer: Medicaid Other | Attending: Nurse Practitioner | Admitting: Nurse Practitioner

## 2023-06-20 ENCOUNTER — Ambulatory Visit (HOSPITAL_COMMUNITY): Payer: Medicaid Other

## 2023-06-20 ENCOUNTER — Encounter (HOSPITAL_COMMUNITY): Admission: RE | Admit: 2023-06-20 | Payer: Medicaid Other | Source: Ambulatory Visit

## 2023-06-20 VITALS — BP 148/98 | HR 112 | Resp 16 | Ht 62.0 in | Wt 306.6 lb

## 2023-06-20 DIAGNOSIS — G4733 Obstructive sleep apnea (adult) (pediatric): Secondary | ICD-10-CM | POA: Diagnosis not present

## 2023-06-20 DIAGNOSIS — I10 Essential (primary) hypertension: Secondary | ICD-10-CM | POA: Diagnosis not present

## 2023-06-20 DIAGNOSIS — I5022 Chronic systolic (congestive) heart failure: Secondary | ICD-10-CM | POA: Diagnosis not present

## 2023-06-20 DIAGNOSIS — I428 Other cardiomyopathies: Secondary | ICD-10-CM

## 2023-06-20 DIAGNOSIS — F418 Other specified anxiety disorders: Secondary | ICD-10-CM

## 2023-06-20 NOTE — Telephone Encounter (Signed)
 Samantha Romero came in to notify staff that she saw Samantha Searing NP earlier today and that she had not taken her medication in the past 2 days. I recommended that Samantha Romero not exercise today at cardiac rehab and that Samantha Romero go home and take her blood pressure medications. Samantha Romero plans to return to exercise on Wednesday. Samantha Romero says she plans to get back on track.Thayer Headings RN BSN

## 2023-06-20 NOTE — Patient Instructions (Addendum)
 Medication Instructions:  Your physician recommends that you continue on your current medications as directed. Please refer to the Current Medication list given to you today. *If you need a refill on your cardiac medications before your next appointment, please call your pharmacy*   Lab Work: None ordered If you have labs (blood work) drawn today and your tests are completely normal, you will receive your results only by: MyChart Message (if you have MyChart) OR A paper copy in the mail If you have any lab test that is abnormal or we need to change your treatment, we will call you to review the results.   Testing/Procedures: Your physician has requested that you have an echocardiogram. Echocardiography is a painless test that uses sound waves to create images of your heart. It provides your doctor with information about the size and shape of your heart and how well your heart's chambers and valves are working. This procedure takes approximately one hour. There are no restrictions for this procedure. Please do NOT wear cologne, perfume, aftershave, or lotions (deodorant is allowed). Please arrive 15 minutes prior to your appointment time.  Please note: We ask at that you not bring children with you during ultrasound (echo/ vascular) testing. Due to room size and safety concerns, children are not allowed in the ultrasound rooms during exams. Our front office staff cannot provide observation of children in our lobby area while testing is being conducted. An adult accompanying a patient to their appointment will only be allowed in the ultrasound room at the discretion of the ultrasound technician under special circumstances. We apologize for any inconvenience. SCHEDULE ECHO FOR JUNE 2025   Follow-Up: At [facility], you and your health needs are our priority.  As part of our continuing mission to provide you with exceptional heart care, we have created designated Provider Care Teams.   These Care Teams include your primary Cardiologist (physician) and Advanced Practice Providers (APPs -  Physician Assistants and Nurse Practitioners) who all work together to provide you with the care you need, when you need it.  We recommend signing up for the patient portal called "MyChart".  Sign up information is provided on this After Visit Summary.  MyChart is used to connect with patients for Virtual Visits (Telemedicine).  Patients are able to view lab/test results, encounter notes, upcoming appointments, etc.  Non-urgent messages can be sent to your provider as well.   To learn more about what you can do with MyChart, go to [email or web address].    Your next appointment:   3 month(s)  Provider:   Robin Searing, NP  Other Instructions  You have been referred to PharmD Check your blood pressure daily for 2 weeks, then contact the office with your readings.  Your goal blood pressure is 130/80. Contact the office either by phone or MyChart with your readings.  Make sure to check your blood pressure 2 hours after taking your medications.   AVOID these things for 30 minutes before checking your blood pressure: No Drinking caffeine. No Drinking alcohol. No Eating. No Smoking. No Exercising.  Five minutes before checking your blood pressure: Pee. Sit in a dining chair. Avoid sitting in a soft couch or armchair. Be quiet. Do not talk.     1st Floor: - Lobby - Registration  - Pharmacy  - Lab - Cafe  2nd Floor: - PV Lab - Diagnostic Testing (echo, CT, nuclear med)  3rd Floor: - Vacant  4th Floor: - TCTS (cardiothoracic surgery) - AFib Clinic -  Structural Heart Clinic - Vascular Surgery  - Vascular Ultrasound  5th Floor: - HeartCare Cardiology (general and EP) - Clinical Pharmacy for coumadin, hypertension, lipid, weight-loss medications, and med management appointments    Valet parking services will be available as well.

## 2023-06-22 ENCOUNTER — Encounter (HOSPITAL_COMMUNITY)
Admission: RE | Admit: 2023-06-22 | Discharge: 2023-06-22 | Disposition: A | Discharge status: Home / Self Care | Payer: Medicaid Other | Source: Ambulatory Visit | Attending: Cardiology | Admitting: Cardiology

## 2023-06-22 ENCOUNTER — Ambulatory Visit (HOSPITAL_COMMUNITY): Payer: Medicaid Other

## 2023-06-22 DIAGNOSIS — I5022 Chronic systolic (congestive) heart failure: Secondary | ICD-10-CM | POA: Diagnosis not present

## 2023-06-23 NOTE — Progress Notes (Signed)
 QUALITY OF LIFE SCORE REVIEW  Pt completed Quality of Life survey as a participant in Cardiac Rehab.  Scores 19.0 or below are considered low.  Pt score very low in several areas Overall 7.06, Health and Function 5.07, socioeconomic 6.63, physiological and spiritual 5.71, family 15.6. Patient quality of life slightly altered by physical constraints which limits ability to perform as prior to recent cardiac illness. Samantha Romero admits to being very dissatisfied with her health and recently returned to exercise at cardiac rehab after being absent for several days.Samantha Romero says she always feels better when she attends cardiac rehab and has joined a Firefighter. Discussed maybe going with a friend for her first visit. Samantha Romero recently changed her PCP to Burnell Blanks NP to be closer to her home. Samantha Romero is very pleased with her new provider. Samantha Romero says she is taking her medications as prescribed currently although she has had some relapses with medication non compliance. Samantha Romero said that Robin Searing NP gave her a 7 day medications pill dispenser and that has made it easier for her.  Samantha Romero admits to having continued low energy and works on breathing techniques at home. Samantha Romero is not working currently. I gave Samantha Romero a vocational rehab packet to review and fill out. Samantha Romero says she is looking a possibly doing some contract work. Samantha Romero is currently staying with her mother.  Samantha Romero is interested in receiving counseling will refer to [employer]. Samantha Romero says the previous counselor was not a good fit for her.   Samantha Romero has made some dietary and lifestyle changes. I congratulated Samantha Romero on her progress.   Offered emotional support and reassurance.  Will continue to monitor and intervene as necessary. Samantha Headings RN BSN

## 2023-06-24 ENCOUNTER — Ambulatory Visit (HOSPITAL_COMMUNITY): Payer: Medicaid Other

## 2023-06-24 ENCOUNTER — Encounter (HOSPITAL_COMMUNITY)
Admission: RE | Admit: 2023-06-24 | Discharge: 2023-06-24 | Disposition: A | Discharge status: Home / Self Care | Payer: Medicaid Other | Source: Ambulatory Visit | Attending: Cardiology | Admitting: Cardiology

## 2023-06-24 DIAGNOSIS — I5022 Chronic systolic (congestive) heart failure: Secondary | ICD-10-CM | POA: Diagnosis not present

## 2023-06-24 NOTE — Progress Notes (Signed)
 Cardiac Individual Treatment Plan  Patient Details  Name: Samantha Romero MRN: [id number] Date of Birth: 09-15-1975 Referring Provider:   Flowsheet Row CARDIAC REHAB PHASE II ORIENTATION from 05/24/2023 in [facility]  Referring Provider Jake Bathe, MD       Initial Encounter Date:  Flowsheet Row CARDIAC REHAB PHASE II ORIENTATION from 05/24/2023 in [facility]  Date 05/24/23       Visit Diagnosis: Heart failure, chronic systolic (HCC)  Patient's Home Medications on Admission:  Current Outpatient Medications:    albuterol (PROVENTIL) (2.5 MG/3ML) 0.083% nebulizer solution, Take 3 mLs (2.5 mg total) by nebulization every 6 (six) hours as needed for wheezing or shortness of breath., Disp: 150 mL, Rfl: 1   albuterol (VENTOLIN HFA) 108 (90 Base) MCG/ACT inhaler, Inhale 2 puffs into the lungs every 6 (six) hours as needed for wheezing or shortness of breath., Disp: 8 g, Rfl: 2   ALPRAZolam (XANAX) 1 MG tablet, Take 1 tablet (1 mg total) by mouth 2 (two) times daily as needed for anxiety., Disp: 60 tablet, Rfl: 2   azelastine (ASTELIN) 0.1 % nasal spray, Place 2 sprays into both nostrils 2 (two) times daily., Disp: 90 mL, Rfl: 3   buPROPion (WELLBUTRIN XL) 150 MG 24 hr tablet, TAKE 3 TABLETS BY MOUTH DAILY., Disp: 270 tablet, Rfl: 0   carvedilol (COREG) 25 MG tablet, TAKE 1 TABLET (25 MG TOTAL) BY MOUTH TWICE A DAY WITH MEALS, Disp: 180 tablet, Rfl: 3   DULoxetine (CYMBALTA) 30 MG capsule, Take 1 capsule (30 mg total) by mouth daily., Disp: 30 capsule, Rfl: 5   DULoxetine (CYMBALTA) 60 MG capsule, Take 1 capsule (60 mg total) by mouth daily., Disp: 30 capsule, Rfl: 5   empagliflozin (JARDIANCE) 10 MG TABS tablet, TAKE ONE TABLET BY MOUTH DAILY BEFORE BREAKFAST, Disp: 90 tablet, Rfl: 3   Fluticasone-Salmeterol 232-14 MCG/ACT AEPB, Inhale 1 puff into the lungs in the morning  and at bedtime., Disp: 1 each, Rfl: 5   furosemide (LASIX) 40 MG tablet, Take 1 tablet (40 mg total) by mouth daily. You may take 1 extra tablet (40 mg total) by mouth daily as needed for lower extremity swelling/weight gain., Disp: 105 tablet, Rfl: 2   levocetirizine (XYZAL) 5 MG tablet, Take 1 tablet (5 mg total) by mouth every evening., Disp: 90 tablet, Rfl: 3   levonorgestrel (MIRENA, 52 MG,) 20 MCG/DAY IUD, , Disp: , Rfl:    lisdexamfetamine (VYVANSE) 50 MG capsule, Take 1 capsule (50 mg total) by mouth daily., Disp: 30 capsule, Rfl: 0   [START ON 07/05/2023] lisdexamfetamine (VYVANSE) 50 MG capsule, Take 1 capsule (50 mg total) by mouth daily., Disp: 30 capsule, Rfl: 0   montelukast (SINGULAIR) 10 MG tablet, TAKE 1 TABLET BY MOUTH EVERYDAY AT BEDTIME, Disp: 90 tablet, Rfl: 3   omeprazole (PRILOSEC) 40 MG capsule, TAKE 1 CAPSULE BY MOUTH EVERY DAY, Disp: 90 capsule, Rfl: 3   sacubitril-valsartan (ENTRESTO) 97-103 MG, Take 1 tablet by mouth 2 (two) times daily., Disp: 60 tablet, Rfl: 11   spironolactone (ALDACTONE) 25 MG tablet, Take 1 tablet (25 mg total) by mouth daily., Disp: 90 tablet, Rfl: 3   traZODone (DESYREL) 50 MG tablet, Take 1 tablet (50 mg total) by mouth at bedtime., Disp: 30 tablet, Rfl: 5  Past Medical History: Past Medical History:  Diagnosis Date   Anxiety    panic attacks  Asthma    CHF (congestive heart failure), NYHA class II, acute, systolic (HCC) 10/06/2018   Depression    Fibromyalgia    Hypertension    no meds currently   No pertinent past medical history    Pneumonia    hx of    Sinus problem     Tobacco Use: Social History   Tobacco Use  Smoking Status Never  Smokeless Tobacco Never    Labs: Review Flowsheet       Latest Ref Rng & Units 08/17/2019 11/19/2019 05/08/2020 01/06/2022  Labs for ITP Cardiac and Pulmonary Rehab  Cholestrol 100 - 199 mg/dL 409  811  - -  LDL (calc) 0 - 99 mg/dL 82  91  - -  HDL-C >91 mg/dL 53  50  - -  Trlycerides 0 -  149 mg/dL 478  93  - -  Hemoglobin A1c 4.8 - 5.6 % 6.3  - 6.0  5.9     Capillary Blood Glucose: No results found for: "GLUCAP"   Exercise Target Goals: Exercise Program Goal: Individual exercise prescription set using results from initial 6 min walk test and THRR while considering  patient's activity barriers and safety.   Exercise Prescription Goal: Initial exercise prescription builds to 30-45 minutes a day of aerobic activity, 2-3 days per week.  Home exercise guidelines will be given to patient during program as part of exercise prescription that the participant will acknowledge.  Activity Barriers & Risk Stratification:  Activity Barriers & Cardiac Risk Stratification - 05/24/23 1404       Activity Barriers & Cardiac Risk Stratification   Activity Barriers Fibromyalgia;Deconditioning;Other (comment);Shortness of Breath;Assistive Device    Comments Left hip pain. She has a cane but not currently using.    Cardiac Risk Stratification Moderate             6 Minute Walk:  6 Minute Walk     Row Name 05/24/23 1513         6 Minute Walk   Phase Initial     Distance 900 feet     Walk Time 6 minutes     # of Rest Breaks 0     MPH 1.7     METS 2.8     RPE 12     Perceived Dyspnea  1     VO2 Peak 9.81     Symptoms Yes (comment)     Comments Mild shortness of breath.     Resting HR 105 bpm     Resting BP 128/78     Resting Oxygen Saturation  97 %     Exercise Oxygen Saturation  during 6 min walk 98 %     Max Ex. HR 140 bpm     Max Ex. BP 172/82     2 Minute Post BP 158/78              Oxygen Initial Assessment:   Oxygen Re-Evaluation:   Oxygen Discharge (Final Oxygen Re-Evaluation):   Initial Exercise Prescription:  Initial Exercise Prescription - 05/24/23 1500       Date of Initial Exercise RX and Referring Provider   Date 05/24/23    Referring Provider Jake Bathe, MD    Expected Discharge Date 08/17/23      Treadmill   MPH 1.7    Grade  0    Minutes 10    METs 2.3      T5 Nustep   Level 1    SPM  85    Minutes 20    METs 2.3      Prescription Details   Frequency (times per week) 3    Duration Progress to 30 minutes of continuous aerobic without signs/symptoms of physical distress      Intensity   THRR 40-80% of Max Heartrate 70-139    Ratings of Perceived Exertion 11-13    Perceived Dyspnea 0-4      Progression   Progression Continue to progress workloads to maintain intensity without signs/symptoms of physical distress.      Resistance Training   Training Prescription Yes    Weight 2 lbs    Reps 10-15             Perform Capillary Blood Glucose checks as needed.  Exercise Prescription Changes:   Exercise Prescription Changes     Row Name 05/30/23 1709 06/24/23 1628           Response to Exercise   Blood Pressure (Admit) 116/64 118/62      Blood Pressure (Exercise) 138/68 122/74      Blood Pressure (Exit) 124/86 104/60      Heart Rate (Admit) 80 bpm 93 bpm      Heart Rate (Exercise) 130 bpm 128 bpm      Heart Rate (Exit) 89 bpm 103 bpm      Rating of Perceived Exertion (Exercise) 10.5 11      Perceived Dyspnea (Exercise) 0 0      Symptoms 0 0      Comments Pt first day in the [facility] program Reviewed MET's, goals and home ExRx      Duration Progress to 30 minutes of  aerobic without signs/symptoms of physical distress Progress to 30 minutes of  aerobic without signs/symptoms of physical distress      Intensity THRR unchanged THRR unchanged        Progression   Progression Continue to progress workloads to maintain intensity without signs/symptoms of physical distress. Continue to progress workloads to maintain intensity without signs/symptoms of physical distress.      Average METs 2.15 2.8        Resistance Training   Training Prescription Yes Yes      Weight 2 lbs 2 lbs      Reps 10-15 10-15      Time 10 Minutes 10 Minutes        Treadmill   MPH 1.7 --      Grade 0 --       Minutes 10 --      METs 2.3 --        T5 Nustep   Level 1 3      SPM 88 111      Minutes 20 30      METs 10 2.8  05/30/23 ERROR on MET's. True MET's were 2.0, not 10               Exercise Comments:   Exercise Comments     Row Name 05/30/23 1712 06/24/23 1638         Exercise Comments Pt first day in the [facility] program. Pt tolerated exercise well with an average MET level of 2.15. Pt is learning her THRR, RPE and ExRx. Will continue to monitor pt and progress workloads as tolerated without sign or symptom Reviewed MET's, goals and home ExRx. Pt tolerated exercise well with an average MET level of 2.8. She is increasing WL's and MET's. She feels good about her  goals and is increasing sttrength and stamina, she is also feeling in better control of her SOB. She is going to add in 2 days of exercise for about 30-45 mins by trying walking, [employer], dance Fit, Better Me app, and strength training. Reviewed gradual progression with weight lifting.               Exercise Goals and Review:   Exercise Goals     Row Name 05/24/23 1407             Exercise Goals   Increase Physical Activity Yes       Intervention Provide advice, education, support and counseling about physical activity/exercise needs.;Develop an individualized exercise prescription for aerobic and resistive training based on initial evaluation findings, risk stratification, comorbidities and participant's personal goals.       Expected Outcomes Short Term: Attend rehab on a regular basis to increase amount of physical activity.;Long Term: Exercising regularly at least 3-5 days a week.;Long Term: Add in home exercise to make exercise part of routine and to increase amount of physical activity.       Increase Strength and Stamina Yes       Intervention Provide advice, education, support and counseling about physical activity/exercise needs.;Develop an individualized exercise prescription for aerobic and  resistive training based on initial evaluation findings, risk stratification, comorbidities and participant's personal goals.       Expected Outcomes Short Term: Increase workloads from initial exercise prescription for resistance, speed, and METs.;Short Term: Perform resistance training exercises routinely during rehab and add in resistance training at home;Long Term: Improve cardiorespiratory fitness, muscular endurance and strength as measured by increased METs and functional capacity ( )       Able to understand and use rate of perceived exertion (RPE) scale Yes       Intervention Provide education and explanation on how to use RPE scale       Expected Outcomes Short Term: Able to use RPE daily in rehab to express subjective intensity level;Long Term:  Able to use RPE to guide intensity level when exercising independently       Knowledge and understanding of Target Heart Rate Range (THRR) Yes       Intervention Provide education and explanation of THRR including how the numbers were predicted and where they are located for reference       Expected Outcomes Short Term: Able to state/look up THRR;Long Term: Able to use THRR to govern intensity when exercising independently;Short Term: Able to use daily as guideline for intensity in rehab       Able to check pulse independently Yes       Intervention Provide education and demonstration on how to check pulse in carotid and radial arteries.;Review the importance of being able to check your own pulse for safety during independent exercise       Expected Outcomes Short Term: Able to explain why pulse checking is important during independent exercise;Long Term: Able to check pulse independently and accurately       Understanding of Exercise Prescription Yes       Intervention Provide education, explanation, and written materials on patient's individual exercise prescription       Expected Outcomes Short Term: Able to explain program exercise  prescription;Long Term: Able to explain home exercise prescription to exercise independently                Exercise Goals Re-Evaluation :  Exercise Goals Re-Evaluation     Row  Name 05/30/23 1711 06/24/23 1634           Exercise Goal Re-Evaluation   Exercise Goals Review Increase Physical Activity;Understanding of Exercise Prescription;Increase Strength and Stamina;Knowledge and understanding of Target Heart Rate Range (THRR);Able to understand and use rate of perceived exertion (RPE) scale Increase Physical Activity;Understanding of Exercise Prescription;Increase Strength and Stamina;Knowledge and understanding of Target Heart Rate Range (THRR);Able to understand and use rate of perceived exertion (RPE) scale      Comments Pt first day in the [facility] program. Pt tolerated exercise well with an average MET level of 2.15. Pt is learning her THRR, RPE and ExRx. Reviewed MET's, goals and home ExRx. Pt tolerated exercise well with an average MET level of 2.8. She is increasing WL's and MET's. She feels good about her goals and is increasing sttrength and stamina, she is also feeling in better control of her SOB. She is going to add in 2 days of exercise for about 30-45 mins by trying walking, [employer], dance Fit, Better Me app, and strength training. Reviewed gradual progression with weight lifting.      Expected Outcomes Will continue to monitor pt and progress workloads as tolerated without sign or symptom Will continue to monitor pt and progress workloads as tolerated without sign or symptom               Discharge Exercise Prescription (Final Exercise Prescription Changes):  Exercise Prescription Changes - 06/24/23 1628       Response to Exercise   Blood Pressure (Admit) 118/62    Blood Pressure (Exercise) 122/74    Blood Pressure (Exit) 104/60    Heart Rate (Admit) 93 bpm    Heart Rate (Exercise) 128 bpm    Heart Rate (Exit) 103 bpm    Rating of Perceived Exertion  (Exercise) 11    Perceived Dyspnea (Exercise) 0    Symptoms 0    Comments Reviewed MET's, goals and home ExRx    Duration Progress to 30 minutes of  aerobic without signs/symptoms of physical distress    Intensity THRR unchanged      Progression   Progression Continue to progress workloads to maintain intensity without signs/symptoms of physical distress.    Average METs 2.8      Resistance Training   Training Prescription Yes    Weight 2 lbs    Reps 10-15    Time 10 Minutes      T5 Nustep   Level 3    SPM 111    Minutes 30    METs 2.8   05/30/23 ERROR on MET's. True MET's were 2.0, not 10            Nutrition:  Target Goals: Understanding of nutrition guidelines, daily intake of sodium 1500mg , cholesterol 200mg , calories 30% from fat and 7% or less from saturated fats, daily to have 5 or more servings of fruits and vegetables.  Biometrics:  Pre Biometrics - 05/24/23 1334       Pre Biometrics   Waist Circumference 61.5 inches    Hip Circumference 63 inches    Waist to Hip Ratio 0.98 %    Triceps Skinfold 45 mm    % Body Fat 65.7 %    Grip Strength 31 kg    Flexibility 11 in    Single Leg Stand 30 seconds              Nutrition Therapy Plan and Nutrition Goals:  Nutrition Therapy & Goals - 06/08/23  1539       Nutrition Therapy   Diet Heart Healthy Diet      Personal Nutrition Goals   Nutrition Goal Patient to identify strategies for reducing cardiovascular risk by attending the [facility] education and nutrition series weekly.    Personal Goal #2 Patient to improve diet quality by using the plate method as a guide for meal planning to include lean protein/plant protein, fruits, vegetables, whole grains, nonfat dairy as part of a well-balanced diet.    Personal Goal #3 Patient to reduce sodium intake to 1500mg  per day.    Personal Goal #4 Patient to identify strategies for weight loss of 0.5-2.0# per week.    Comments Goals in progress. Samantha Romero has medical  history of CHF, HTN, lap band, OSA. She has started making many dietary changes including increased dietary fiber and decreased sodium intake. She is down 10# since starting with our program. Patient will benefit from participation in intensive cardiac rehab for nutrition, exercise, and lifestyle modification.      Intervention Plan   Intervention Prescribe, educate and counsel regarding individualized specific dietary modifications aiming towards targeted core components such as weight, hypertension, lipid management, diabetes, heart failure and other comorbidities.;Nutrition handout(s) given to patient.    Expected Outcomes Short Term Goal: Understand basic principles of dietary content, such as calories, fat, sodium, cholesterol and nutrients.;Long Term Goal: Adherence to prescribed nutrition plan.             Nutrition Assessments:  Nutrition Assessments - 06/02/23 1238       Rate Your Plate Scores   Pre Score 48            MEDIFICTS Score Key: >=70 Need to make dietary changes  40-70 Heart Healthy Diet <= 40 Therapeutic Level Cholesterol Diet   Flowsheet Row CARDIAC REHAB PHASE II EXERCISE from 06/01/2023 in [facility]  Picture Your Plate Total Score on Admission 48      Picture Your Plate Scores: <13 Unhealthy dietary pattern with much room for improvement. 41-50 Dietary pattern unlikely to meet recommendations for good health and room for improvement. 51-60 More healthful dietary pattern, with some room for improvement.  >60 Healthy dietary pattern, although there may be some specific behaviors that could be improved.    Nutrition Goals Re-Evaluation:  Nutrition Goals Re-Evaluation     Row Name 06/08/23 1539             Goals   Current Weight 303 lb 2.1 oz (137.5 kg)       Comment A1c 5.9, no recent lipid panel available for review       Expected Outcome Goals in progress. Samantha Romero has medical history  of CHF, HTN, lap band, OSA. She has started making many dietary changes including increased dietary fiber and decreased sodium intake. She is down 10# since starting with our program. Patient will benefit from participation in intensive cardiac rehab for nutrition, exercise, and lifestyle modification.                Nutrition Goals Re-Evaluation:  Nutrition Goals Re-Evaluation     Row Name 06/08/23 1539             Goals   Current Weight 303 lb 2.1 oz (137.5 kg)       Comment A1c 5.9, no recent lipid panel available for review       Expected Outcome Goals in progress. Samantha Romero has medical history of  CHF, HTN, lap band, OSA. She has started making many dietary changes including increased dietary fiber and decreased sodium intake. She is down 10# since starting with our program. Patient will benefit from participation in intensive cardiac rehab for nutrition, exercise, and lifestyle modification.                Nutrition Goals Discharge (Final Nutrition Goals Re-Evaluation):  Nutrition Goals Re-Evaluation - 06/08/23 1539       Goals   Current Weight 303 lb 2.1 oz (137.5 kg)    Comment A1c 5.9, no recent lipid panel available for review    Expected Outcome Goals in progress. Samantha Romero has medical history of CHF, HTN, lap band, OSA. She has started making many dietary changes including increased dietary fiber and decreased sodium intake. She is down 10# since starting with our program. Patient will benefit from participation in intensive cardiac rehab for nutrition, exercise, and lifestyle modification.             Psychosocial: Target Goals: Acknowledge presence or absence of significant depression and/or stress, maximize coping skills, provide positive support system. Participant is able to verbalize types and ability to use techniques and skills needed for reducing stress and depression.  Initial Review & Psychosocial Screening:  Initial Psych Review & Screening - 05/24/23  1512       Initial Review   Current issues with Current Depression;History of Depression;Current Anxiety/Panic;Current Psychotropic Meds;Current Sleep Concerns;Current Stress Concerns    Source of Stress Concerns Chronic Illness;Financial;Poor Coping Skills;Unable to participate in former interests or hobbies;Unable to perform yard/household activities;Occupation    Comments Samantha Romero is currently depressed as she has been battling severe depression for the past several years. Samantha Romero says the medications she is taking for anxiety and depression are being titrated. Samantha Romero hopes that her anitdepressant will become more effective in managing her depression. Samantha Romero has a Paramedic and a psychiatirst that she currently sees. Samantha Romero previously had issues with obtaining medication due to no income. Housing insufficiency. Samantha Romero now has housing [employer].      Family Dynamics   Good Support System? Yes   Samantha Romero says that she has a good support network of friends and family in the area.   Comments Samantha Romero would like to eventually change her therapy and counselling to a location closer to her home in [facility] if possible.      Barriers   Psychosocial barriers to participate in program The patient should benefit from training in stress management and relaxation.      Screening Interventions   Interventions To provide support and resources with identified psychosocial needs;Provide feedback about the scores to participant;Encouraged to exercise    Expected Outcomes Short Term goal: Utilizing psychosocial counselor, staff and physician to assist with identification of specific Stressors or current issues interfering with healing process. Setting desired goal for each stressor or current issue identified.;Long Term Goal: Stressors or current issues are controlled or eliminated.;Short Term goal: Identification and review with participant of any Quality of Life or Depression concerns found by  scoring the questionnaire.;Long Term goal: The participant improves quality of Life and PHQ9 Scores as seen by post scores and/or verbalization of changes             Quality of Life Scores:  Quality of Life - 05/24/23 1439       Quality of Life   Select Quality of Life      Quality of Life Scores   Health/Function Pre 5.07 %  Socioeconomic Pre 6.63 %    Psych/Spiritual Pre 5.71 %    Family Pre 15.6 %    GLOBAL Pre 7.06 %            Scores of 19 and below usually indicate a poorer quality of life in these areas.  A difference of  2-3 points is a clinically meaningful difference.  A difference of 2-3 points in the total score of the Quality of Life Index has been associated with significant improvement in overall quality of life, self-image, physical symptoms, and general health in studies assessing change in quality of life.  PHQ-9: Review Flowsheet  More data exists      05/24/2023 04/22/2022 03/24/2022 02/25/2022 01/06/2022  Depression screen PHQ 2/9  Decreased Interest 3  1 2  0  Down, Depressed, Hopeless 3  2 3 1   PHQ - 2 Score 6  3 5 1   Altered sleeping 3  3 3 3   Tired, decreased energy 2  3 3 2   Change in appetite 2  1 3 1   Feeling bad or failure about yourself  1  3 0 0  Trouble concentrating 2  3 3 3   Moving slowly or fidgety/restless 0  0 2 0  Suicidal thoughts 0  0 0 0  PHQ-9 Score 16  16 19 10   Difficult doing work/chores Extremely dIfficult  Somewhat difficult Very difficult Extremely dIfficult    Details       Information is confidential and restricted. Go to Review Flowsheets to unlock data.        Interpretation of Total Score  Total Score Depression Severity:  1-4 = Minimal depression, 5-9 = Mild depression, 10-14 = Moderate depression, 15-19 = Moderately severe depression, 20-27 = Severe depression   Psychosocial Evaluation and Intervention:   Psychosocial Re-Evaluation:  Psychosocial Re-Evaluation     Row Name 06/23/23 1030              Psychosocial Re-Evaluation   Current issues with History of Depression;Current Psychotropic Meds;Current Depression;Current Anxiety/Panic;Current Stress Concerns;Current Sleep Concerns       Comments Reviewed quality of life questionnaire.Samantha Romero admits to being very dissatisfied with her health and recently returned to exercise at cardiac rehab after being absent for several days.Samantha Romero says she always feels better when she attends cardiac rehab and has joined a Firefighter. Discussed maybe going with a friend for her first visit. Samantha Romero recently changed her PCP to Samantha Blanks NP to be closer to her home. Samantha Romero is very pleased with her new provider. Samantha Romero says she is taking her medications as prescribed currently although she has had some relapses with medication non compliance. Bahar said that Samantha Searing NP gave her a 7 day medications pill dispenser and that has made it easier for her.   Lacresha admits to having continued low energy and works on breathing techniques at home. Caelynn is not working currently. I gave Kashish a vocational rehab packet to review and fill out. Zaeda says she is looking a possibly doing some contract work. Lachele is currently staying with her mother.  Ysidra is interested in receiving counseling will refer to [employer]. Samantha Romero's PCP also forwarded quality of life review.       Expected Outcomes Betzaira says particpating in cardiac rehab has been a stress reliever for her       Interventions Therapist referral;Stress management education;Relaxation education;Encouraged to attend Cardiac Rehabilitation for the exercise       Continue Psychosocial Services  Follow  up required by staff         Initial Review   Source of Stress Concerns Chronic Illness;Unable to perform yard/household activities;Unable to participate in former interests or hobbies;Financial;Occupation;Poor Coping Skills       Comments Will continiue to monitor and offer support as needed                 Psychosocial Discharge (Final Psychosocial Re-Evaluation):  Psychosocial Re-Evaluation - 06/23/23 1030       Psychosocial Re-Evaluation   Current issues with History of Depression;Current Psychotropic Meds;Current Depression;Current Anxiety/Panic;Current Stress Concerns;Current Sleep Concerns    Comments Reviewed quality of life questionnaire.Samantha Romero admits to being very dissatisfied with her health and recently returned to exercise at cardiac rehab after being absent for several days.Samantha Romero says she always feels better when she attends cardiac rehab and has joined a Firefighter. Discussed maybe going with a friend for her first visit. Shawnee recently changed her PCP to Samantha Blanks NP to be closer to her home. Clelia is very pleased with her new provider. Shaterica says she is taking her medications as prescribed currently although she has had some relapses with medication non compliance. Maicie said that Samantha Searing NP gave her a 7 day medications pill dispenser and that has made it easier for her.   Nicolle admits to having continued low energy and works on breathing techniques at home. Luceal is not working currently. I gave Skilynn a vocational rehab packet to review and fill out. Kamea says she is looking a possibly doing some contract work. Takesha is currently staying with her mother.  Kemora is interested in receiving counseling will refer to [employer]. Dorella's PCP also forwarded quality of life review.    Expected Outcomes Amelianna says particpating in cardiac rehab has been a stress reliever for her    Interventions Therapist referral;Stress management education;Relaxation education;Encouraged to attend Cardiac Rehabilitation for the exercise    Continue Psychosocial Services  Follow up required by staff      Initial Review   Source of Stress Concerns Chronic Illness;Unable to perform yard/household activities;Unable to participate in former interests or  hobbies;Financial;Occupation;Poor Coping Skills    Comments Will continiue to monitor and offer support as needed             Vocational Rehabilitation: Provide vocational rehab assistance to qualifying candidates.   Vocational Rehab Evaluation & Intervention:  Vocational Rehab - 05/24/23 1526       Initial Vocational Rehab Evaluation & Intervention   Assessment shows need for Vocational Rehabilitation Yes    Vocational Rehab Packet given to patient 05/24/23      Vocational Rehab Re-Evaulation   Comments Atalaya was given a vocational rehab packet to review and return on her next scheduled exercise session. De             Education: Education Goals: Education classes will be provided on a weekly basis, covering required topics. Participant will state understanding/return demonstration of topics presented.    Education     Row Name 05/30/23 1400     Education   Cardiac Education Topics Pritikin   Select Workshops     Workshops   Educator Exercise Physiologist   [facility] Psychosocial   Psychosocial Workshop Recognizing and Reducing Stress   Instruction Review Code 1- Verbalizes Understanding   Class Start Time 1358   Class Stop Time 1445   Class Time Calculation (min) 47 min    Row Name 06/01/23 1400  Education   Cardiac Education Topics Pritikin   Orthoptist   Educator Dietitian   Weekly Topic Delicious Desserts   Instruction Review Code 1- Verbalizes Understanding   Class Start Time 1355   Class Stop Time 1433   Class Time Calculation (min) 38 min    Row Name 06/03/23 1500     Education   Cardiac Education Topics Pritikin   Nurse, children's   Educator Dietitian   Select Nutrition   Nutrition Calorie Density   Instruction Review Code 1- Verbalizes Understanding   Class Start Time 1400   Class Stop Time 1435   Class Time Calculation (min) 35 min    Row Name 06/08/23 1400     Education    Cardiac Education Topics Pritikin   Customer service manager   Weekly Topic Efficiency Cooking - Meals in a Snap   Instruction Review Code 1- Verbalizes Understanding   Class Start Time 1345   Class Stop Time 1435   Class Time Calculation (min) 50 min    Row Name 06/22/23 1600     Education   Cardiac Education Topics Pritikin   Customer service manager   Weekly Topic Comforting Weekend Breakfasts   Instruction Review Code 1- Verbalizes Understanding   Class Start Time 1400   Class Stop Time 1440   Class Time Calculation (min) 40 min    Row Name 06/24/23 1500     Education   Cardiac Education Topics Pritikin   Psychologist, counselling   Nutrition Dining Out - Part 1   Instruction Review Code 1- Verbalizes Understanding   Class Start Time 1400   Class Stop Time 1440   Class Time Calculation (min) 40 min            Core Videos: Exercise    Move It!  Clinical staff conducted group or individual video education with verbal and written material and guidebook.  Patient learns the recommended Pritikin exercise program. Exercise with the goal of living a long, healthy life. Some of the health benefits of exercise include controlled diabetes, healthier blood pressure levels, improved cholesterol levels, improved heart and lung capacity, improved sleep, and better body composition. Everyone should speak with their doctor before starting or changing an exercise routine.  Biomechanical Limitations Clinical staff conducted group or individual video education with verbal and written material and guidebook.  Patient learns how biomechanical limitations can impact exercise and how we can mitigate and possibly overcome limitations to have an impactful and balanced exercise routine.  Body Composition Clinical staff conducted group or individual video education with verbal and  written material and guidebook.  Patient learns that body composition (ratio of muscle mass to fat mass) is a key component to assessing overall fitness, rather than body weight alone. Increased fat mass, especially visceral belly fat, can put [place] at increased risk for metabolic syndrome, type 2 diabetes, heart disease, and even death. It is recommended to combine diet and exercise (cardiovascular and resistance training) to improve your body composition. Seek guidance from your physician and exercise physiologist before implementing an exercise routine.  Exercise Action Plan Clinical staff conducted group or individual video education with verbal and written material and guidebook.  Patient learns the recommended strategies to achieve  and enjoy long-term exercise adherence, including variety, self-motivation, self-efficacy, and positive decision making. Benefits of exercise include fitness, good health, weight management, more energy, better sleep, less stress, and overall well-being.  Medical   Heart Disease Risk Reduction Clinical staff conducted group or individual video education with verbal and written material and guidebook.  Patient learns our heart is our most vital organ as it circulates oxygen, nutrients, white blood cells, and hormones throughout the entire body, and carries waste away. Data supports a plant-based eating plan like the Pritikin Program for its effectiveness in slowing progression of and reversing heart disease. The video provides a number of recommendations to address heart disease.   Metabolic Syndrome and Belly Fat  Clinical staff conducted group or individual video education with verbal and written material and guidebook.  Patient learns what metabolic syndrome is, how it leads to heart disease, and how one can reverse it and keep it from coming back. You have metabolic syndrome if you have 3 of the following 5 criteria: abdominal obesity, high blood pressure, high  triglycerides, low HDL cholesterol, and high blood sugar.  Hypertension and Heart Disease Clinical staff conducted group or individual video education with verbal and written material and guidebook.  Patient learns that high blood pressure, or hypertension, is very common in the [place]. Hypertension is largely due to excessive salt intake, but other important risk factors include being overweight, physical inactivity, drinking too much alcohol, smoking, and not eating enough potassium from fruits and vegetables. High blood pressure is a leading risk factor for heart attack, stroke, congestive heart failure, dementia, kidney failure, and premature death. Long-term effects of excessive salt intake include stiffening of the arteries and thickening of heart muscle and organ damage. Recommendations include ways to reduce hypertension and the risk of heart disease.  Diseases of Our Time - Focusing on Diabetes Clinical staff conducted group or individual video education with verbal and written material and guidebook.  Patient learns why the best way to stop diseases of our time is prevention, through food and other lifestyle changes. Medicine (such as prescription pills and surgeries) is often only a Band-Aid on the problem, not a long-term solution. Most common diseases of our time include obesity, type 2 diabetes, hypertension, heart disease, and cancer. The Pritikin Program is recommended and has been proven to help reduce, reverse, and/or prevent the damaging effects of metabolic syndrome.  Nutrition   Overview of the Pritikin Eating Plan  Clinical staff conducted group or individual video education with verbal and written material and guidebook.  Patient learns about the Pritikin Eating Plan for disease risk reduction. The Pritikin Eating Plan emphasizes a wide variety of unrefined, minimally-processed carbohydrates, like fruits, vegetables, whole grains, and legumes. Go, Caution, and Stop food  choices are explained. Plant-based and lean animal proteins are emphasized. Rationale provided for low sodium intake for blood pressure control, low added sugars for blood sugar stabilization, and low added fats and oils for coronary artery disease risk reduction and weight management.  Calorie Density  Clinical staff conducted group or individual video education with verbal and written material and guidebook.  Patient learns about calorie density and how it impacts the Pritikin Eating Plan. Knowing the characteristics of the food you choose will help you decide whether those foods will lead to weight gain or weight loss, and whether you want to consume more or less of them. Weight loss is usually a side effect of the Pritikin Eating Plan because of its focus on  low calorie-dense foods.  Label Reading  Clinical staff conducted group or individual video education with verbal and written material and guidebook.  Patient learns about the Pritikin recommended label reading guidelines and corresponding recommendations regarding calorie density, added sugars, sodium content, and whole grains.  Dining Out - Part 1  Clinical staff conducted group or individual video education with verbal and written material and guidebook.  Patient learns that restaurant meals can be sabotaging because they can be so high in calories, fat, sodium, and/or sugar. Patient learns recommended strategies on how to positively address this and avoid unhealthy pitfalls.  Facts on Fats  Clinical staff conducted group or individual video education with verbal and written material and guidebook.  Patient learns that lifestyle modifications can be just as effective, if not more so, as many medications for lowering your risk of heart disease. A Pritikin lifestyle can help to reduce your risk of inflammation and atherosclerosis (cholesterol build-up, or plaque, in the artery walls). Lifestyle interventions such as dietary choices and  physical activity address the cause of atherosclerosis. A review of the types of fats and their impact on blood cholesterol levels, along with dietary recommendations to reduce fat intake is also included.  Nutrition Action Plan  Clinical staff conducted group or individual video education with verbal and written material and guidebook.  Patient learns how to incorporate Pritikin recommendations into their lifestyle. Recommendations include planning and keeping personal health goals in mind as an important part of their success.  Healthy Mind-Set    Healthy Minds, Bodies, Hearts  Clinical staff conducted group or individual video education with verbal and written material and guidebook.  Patient learns how to identify when they are stressed. Video will discuss the impact of that stress, as well as the many benefits of stress management. Patient will also be introduced to stress management techniques. The way we think, act, and feel has an impact on our hearts.  How Our Thoughts Can Heal Our Hearts  Clinical staff conducted group or individual video education with verbal and written material and guidebook.  Patient learns that negative thoughts can cause depression and anxiety. This can result in negative lifestyle behavior and serious health problems. Cognitive behavioral therapy is an effective method to help control our thoughts in order to change and improve our emotional outlook.  Additional Videos:  Exercise    Improving Performance  Clinical staff conducted group or individual video education with verbal and written material and guidebook.  Patient learns to use a non-linear approach by alternating intensity levels and lengths of time spent exercising to help burn more calories and lose more body fat. Cardiovascular exercise helps improve heart health, metabolism, hormonal balance, blood sugar control, and recovery from fatigue. Resistance training improves strength, endurance, balance,  coordination, reaction time, metabolism, and muscle mass. Flexibility exercise improves circulation, posture, and balance. Seek guidance from your physician and exercise physiologist before implementing an exercise routine and learn your capabilities and proper form for all exercise.  Introduction to Yoga  Clinical staff conducted group or individual video education with verbal and written material and guidebook.  Patient learns about yoga, a discipline of the coming together of mind, breath, and body. The benefits of yoga include improved flexibility, improved range of motion, better posture and core strength, increased lung function, weight loss, and positive self-image. Yoga's heart health benefits include lowered blood pressure, healthier heart rate, decreased cholesterol and triglyceride levels, improved immune function, and reduced stress. Seek guidance from your physician and  exercise physiologist before implementing an exercise routine and learn your capabilities and proper form for all exercise.  Medical   Aging: Enhancing Your Quality of Life  Clinical staff conducted group or individual video education with verbal and written material and guidebook.  Patient learns key strategies and recommendations to stay in good physical health and enhance quality of life, such as prevention strategies, having an advocate, securing a Health Care Proxy and Power of Attorney, and keeping a list of medications and system for tracking them. It also discusses how to avoid risk for bone loss.  Biology of Weight Control  Clinical staff conducted group or individual video education with verbal and written material and guidebook.  Patient learns that weight gain occurs because we consume more calories than we burn (eating more, moving less). Even if your body weight is normal, you may have higher ratios of fat compared to muscle mass. Too much body fat puts you at increased risk for cardiovascular disease, heart  attack, stroke, type 2 diabetes, and obesity-related cancers. In addition to exercise, following the Pritikin Eating Plan can help reduce your risk.  Decoding Lab Results  Clinical staff conducted group or individual video education with verbal and written material and guidebook.  Patient learns that lab test reflects one measurement whose values change over time and are influenced by many factors, including medication, stress, sleep, exercise, food, hydration, pre-existing medical conditions, and more. It is recommended to use the knowledge from this video to become more involved with your lab results and evaluate your numbers to speak with your doctor.   Diseases of Our Time - Overview  Clinical staff conducted group or individual video education with verbal and written material and guidebook.  Patient learns that according to the CDC, 50% to 70% of chronic diseases (such as obesity, type 2 diabetes, elevated lipids, hypertension, and heart disease) are avoidable through lifestyle improvements including healthier food choices, listening to satiety cues, and increased physical activity.  Sleep Disorders Clinical staff conducted group or individual video education with verbal and written material and guidebook.  Patient learns how good quality and duration of sleep are important to overall health and well-being. Patient also learns about sleep disorders and how they impact health along with recommendations to address them, including discussing with a physician.  Nutrition  Dining Out - Part 2 Clinical staff conducted group or individual video education with verbal and written material and guidebook.  Patient learns how to plan ahead and communicate in order to maximize their dining experience in a healthy and nutritious manner. Included are recommended food choices based on the type of restaurant the patient is visiting.   Fueling a Banker conducted group or individual  video education with verbal and written material and guidebook.  There is a strong connection between our food choices and our health. Diseases like obesity and type 2 diabetes are very prevalent and are in large-part due to lifestyle choices. The Pritikin Eating Plan provides plenty of food and hunger-curbing satisfaction. It is easy to follow, affordable, and helps reduce health risks.  Menu Workshop  Clinical staff conducted group or individual video education with verbal and written material and guidebook.  Patient learns that restaurant meals can sabotage health goals because they are often packed with calories, fat, sodium, and sugar. Recommendations include strategies to plan ahead and to communicate with the manager, chef, or server to help order a healthier meal.  Planning Your Eating Strategy  Clinical staff  conducted group or individual video education with verbal and written material and guidebook.  Patient learns about the Pritikin Eating Plan and its benefit of reducing the risk of disease. The Pritikin Eating Plan does not focus on calories. Instead, it emphasizes high-quality, nutrient-rich foods. By knowing the characteristics of the foods, we choose, we can determine their calorie density and make informed decisions.  Targeting Your Nutrition Priorities  Clinical staff conducted group or individual video education with verbal and written material and guidebook.  Patient learns that lifestyle habits have a tremendous impact on disease risk and progression. This video provides eating and physical activity recommendations based on your personal health goals, such as reducing LDL cholesterol, losing weight, preventing or controlling type 2 diabetes, and reducing high blood pressure.  Vitamins and Minerals  Clinical staff conducted group or individual video education with verbal and written material and guidebook.  Patient learns different ways to obtain key vitamins and minerals,  including through a recommended healthy diet. It is important to discuss all supplements you take with your doctor.   Healthy Mind-Set    Smoking Cessation  Clinical staff conducted group or individual video education with verbal and written material and guidebook.  Patient learns that cigarette smoking and tobacco addiction pose a serious health risk which affects millions of people. Stopping smoking will significantly reduce the risk of heart disease, lung disease, and many forms of cancer. Recommended strategies for quitting are covered, including working with your doctor to develop a successful plan.  Culinary   Becoming a Set designer conducted group or individual video education with verbal and written material and guidebook.  Patient learns that cooking at home can be healthy, cost-effective, quick, and puts them in control. Keys to cooking healthy recipes will include looking at your recipe, assessing your equipment needs, planning ahead, making it simple, choosing cost-effective seasonal ingredients, and limiting the use of added fats, salts, and sugars.  Cooking - Breakfast and Snacks  Clinical staff conducted group or individual video education with verbal and written material and guidebook.  Patient learns how important breakfast is to satiety and nutrition through the entire day. Recommendations include key foods to eat during breakfast to help stabilize blood sugar levels and to prevent overeating at meals later in the day. Planning ahead is also a key component.  Cooking - Educational psychologist conducted group or individual video education with verbal and written material and guidebook.  Patient learns eating strategies to improve overall health, including an approach to cook more at home. Recommendations include thinking of animal protein as a side on your plate rather than center stage and focusing instead on lower calorie dense options like vegetables,  fruits, whole grains, and plant-based proteins, such as beans. Making sauces in large quantities to freeze for later and leaving the skin on your vegetables are also recommended to maximize your experience.  Cooking - Healthy Salads and Dressing Clinical staff conducted group or individual video education with verbal and written material and guidebook.  Patient learns that vegetables, fruits, whole grains, and legumes are the foundations of the Pritikin Eating Plan. Recommendations include how to incorporate each of these in flavorful and healthy salads, and how to create homemade salad dressings. Proper handling of ingredients is also covered. Cooking - Soups and [employer] - Soups and Desserts Clinical staff conducted group or individual video education with verbal and written material and guidebook.  Patient learns that Pritikin soups and  desserts make for easy, nutritious, and delicious snacks and meal components that are low in sodium, fat, sugar, and calorie density, while high in vitamins, minerals, and filling fiber. Recommendations include simple and healthy ideas for soups and desserts.   Overview     The Pritikin Solution Program Overview Clinical staff conducted group or individual video education with verbal and written material and guidebook.  Patient learns that the results of the Pritikin Program have been documented in more than 100 articles published in peer-reviewed journals, and the benefits include reducing risk factors for (and, in some cases, even reversing) high cholesterol, high blood pressure, type 2 diabetes, obesity, and more! An overview of the three key pillars of the Pritikin Program will be covered: eating well, doing regular exercise, and having a healthy mind-set.  WORKSHOPS  Exercise: Exercise Basics: Building Your Action Plan Clinical staff led group instruction and group discussion with PowerPoint presentation and patient guidebook. To enhance the  learning environment the use of posters, models and videos may be added. At the conclusion of this workshop, patients will comprehend the difference between physical activity and exercise, as well as the benefits of incorporating both, into their routine. Patients will understand the FITT (Frequency, Intensity, Time, and Type) principle and how to use it to build an exercise action plan. In addition, safety concerns and other considerations for exercise and cardiac rehab will be addressed by the presenter. The purpose of this lesson is to promote a comprehensive and effective weekly exercise routine in order to improve patients' overall level of fitness.   Managing Heart Disease: Your Path to a Healthier Heart Clinical staff led group instruction and group discussion with PowerPoint presentation and patient guidebook. To enhance the learning environment the use of posters, models and videos may be added.At the conclusion of this workshop, patients will understand the anatomy and physiology of the heart. Additionally, they will understand how Pritikin's three pillars impact the risk factors, the progression, and the management of heart disease.  The purpose of this lesson is to provide a high-level overview of the heart, heart disease, and how the Pritikin lifestyle positively impacts risk factors.  Exercise Biomechanics Clinical staff led group instruction and group discussion with PowerPoint presentation and patient guidebook. To enhance the learning environment the use of posters, models and videos may be added. Patients will learn how the structural parts of their bodies function and how these functions impact their daily activities, movement, and exercise. Patients will learn how to promote a neutral spine, learn how to manage pain, and identify ways to improve their physical movement in order to promote healthy living. The purpose of this lesson is to expose patients to common  physical limitations that impact physical activity. Participants will learn practical ways to adapt and manage aches and pains, and to minimize their effect on regular exercise. Patients will learn how to maintain good posture while sitting, walking, and lifting.  Balance Training and Fall Prevention  Clinical staff led group instruction and group discussion with PowerPoint presentation and patient guidebook. To enhance the learning environment the use of posters, models and videos may be added. At the conclusion of this workshop, patients will understand the importance of their sensorimotor skills (vision, proprioception, and the vestibular system) in maintaining their ability to balance as they age. Patients will apply a variety of balancing exercises that are appropriate for their current level of function. Patients will understand the common causes for poor balance, possible solutions to these problems, and  ways to modify their physical environment in order to minimize their fall risk. The purpose of this lesson is to teach patients about the importance of maintaining balance as they age and ways to minimize their risk of falling.  WORKSHOPS   Nutrition:  Fueling a Ship broker led group instruction and group discussion with PowerPoint presentation and patient guidebook. To enhance the learning environment the use of posters, models and videos may be added. Patients will review the foundational principles of the Pritikin Eating Plan and understand what constitutes a serving size in each of the food groups. Patients will also learn Pritikin-friendly foods that are better choices when away from home and review make-ahead meal and snack options. Calorie density will be reviewed and applied to three nutrition priorities: weight maintenance, weight loss, and weight gain. The purpose of this lesson is to reinforce (in a group setting) the key concepts around what patients are  recommended to eat and how to apply these guidelines when away from home by planning and selecting Pritikin-friendly options. Patients will understand how calorie density may be adjusted for different weight management goals.  Mindful Eating  Clinical staff led group instruction and group discussion with PowerPoint presentation and patient guidebook. To enhance the learning environment the use of posters, models and videos may be added. Patients will briefly review the concepts of the Pritikin Eating Plan and the importance of low-calorie dense foods. The concept of mindful eating will be introduced as well as the importance of paying attention to internal hunger signals. Triggers for non-hunger eating and techniques for dealing with triggers will be explored. The purpose of this lesson is to provide patients with the opportunity to review the basic principles of the Pritikin Eating Plan, discuss the value of eating mindfully and how to measure internal cues of hunger and fullness using the Hunger Scale. Patients will also discuss reasons for non-hunger eating and learn strategies to use for controlling emotional eating.  Targeting Your Nutrition Priorities Clinical staff led group instruction and group discussion with PowerPoint presentation and patient guidebook. To enhance the learning environment the use of posters, models and videos may be added. Patients will learn how to determine their genetic susceptibility to disease by reviewing their family history. Patients will gain insight into the importance of diet as part of an overall healthy lifestyle in mitigating the impact of genetics and other environmental insults. The purpose of this lesson is to provide patients with the opportunity to assess their personal nutrition priorities by looking at their family history, their own health history and current risk factors. Patients will also be able to discuss ways of prioritizing and modifying the Pritikin  Eating Plan for their highest risk areas  Menu  Clinical staff led group instruction and group discussion with PowerPoint presentation and patient guidebook. To enhance the learning environment the use of posters, models and videos may be added. Using menus brought in from [employer], or printed from [employer], patients will apply the Pritikin dining out guidelines that were presented in the [employer] video. Patients will also be able to practice these guidelines in a variety of provided scenarios. The purpose of this lesson is to provide patients with the opportunity to practice hands-on learning of the Pritikin Dining Out guidelines with actual menus and practice scenarios.  Label Reading Clinical staff led group instruction and group discussion with PowerPoint presentation and patient guidebook. To enhance the learning environment the use of posters, models and  videos may be added. Patients will review and discuss the Pritikin label reading guidelines presented in Pritikin's Label Reading Educational series video. Using fool labels brought in from local grocery stores and markets, patients will apply the label reading guidelines and determine if the packaged food meet the Pritikin guidelines. The purpose of this lesson is to provide patients with the opportunity to review, discuss, and practice hands-on learning of the Pritikin Label Reading guidelines with actual packaged food labels. Cooking School  Pritikin's [employer] are designed to teach patients ways to prepare quick, simple, and affordable recipes at home. The importance of nutrition's role in chronic disease risk reduction is reflected in its emphasis in the overall Pritikin program. By learning how to prepare essential core Pritikin Eating Plan recipes, patients will increase control over what they eat; be able to customize the flavor of foods without the use of added salt, sugar, or fat; and  improve the quality of the food they consume. By learning a set of core recipes which are easily assembled, quickly prepared, and affordable, patients are more likely to prepare more healthy foods at home. These workshops focus on convenient breakfasts, simple entres, side dishes, and desserts which can be prepared with minimal effort and are consistent with nutrition recommendations for cardiovascular risk reduction. Cooking [employer] are taught by a Armed forces logistics/support/administrative officer (RD) who has been trained by the [employer]. The chef or RD has a clear understanding of the importance of minimizing - if not completely eliminating - added fat, sugar, and sodium in recipes. Throughout the series of Cooking School Workshop sessions, patients will learn about healthy ingredients and efficient methods of cooking to build confidence in their capability to prepare    Cooking School weekly topics:  Adding Flavor- Sodium-Free  Fast and Healthy Breakfasts  Powerhouse Plant-Based Proteins  Satisfying Salads and Dressings  Simple Sides and Sauces  International Cuisine-Spotlight on the [employer] Zones  Delicious Desserts  Savory Soups  [employer] - Meals in a Astronomer Appetizers and Snacks  Comforting Weekend Breakfasts  One-Pot Wonders   Fast Evening Meals  Landscape architect Your Pritikin Plate  WORKSHOPS   Healthy Mindset (Psychosocial):  Focused Goals, Sustainable Changes Clinical staff led group instruction and group discussion with PowerPoint presentation and patient guidebook. To enhance the learning environment the use of posters, models and videos may be added. Patients will be able to apply effective goal setting strategies to establish at least one personal goal, and then take consistent, meaningful action toward that goal. They will learn to identify common barriers to achieving personal goals and develop strategies to overcome them. Patients will  also gain an understanding of how our mind-set can impact our ability to achieve goals and the importance of cultivating a positive and growth-oriented mind-set. The purpose of this lesson is to provide patients with a deeper understanding of how to set and achieve personal goals, as well as the tools and strategies needed to overcome common obstacles which may arise along the way.  From Head to Heart: The Power of a Healthy Outlook  Clinical staff led group instruction and group discussion with PowerPoint presentation and patient guidebook. To enhance the learning environment the use of posters, models and videos may be added. Patients will be able to recognize and describe the impact of emotions and mood on physical health. They will discover the importance of self-care and explore self-care practices which may work for them. Patients  will also learn how to utilize the 4 C's to cultivate a healthier outlook and better manage stress and challenges. The purpose of this lesson is to demonstrate to patients how a healthy outlook is an essential part of maintaining good health, especially as they continue their cardiac rehab journey.  Healthy Sleep for a Healthy Heart Clinical staff led group instruction and group discussion with PowerPoint presentation and patient guidebook. To enhance the learning environment the use of posters, models and videos may be added. At the conclusion of this workshop, patients will be able to demonstrate knowledge of the importance of sleep to overall health, well-being, and quality of life. They will understand the symptoms of, and treatments for, common sleep disorders. Patients will also be able to identify daytime and nighttime behaviors which impact sleep, and they will be able to apply these tools to help manage sleep-related challenges. The purpose of this lesson is to provide patients with a general overview of sleep and outline the importance of quality sleep. Patients will  learn about a few of the most common sleep disorders. Patients will also be introduced to the concept of "sleep hygiene," and discover ways to self-manage certain sleeping problems through simple daily behavior changes. Finally, the workshop will motivate patients by clarifying the links between quality sleep and their goals of heart-healthy living.   Recognizing and Reducing Stress Clinical staff led group instruction and group discussion with PowerPoint presentation and patient guidebook. To enhance the learning environment the use of posters, models and videos may be added. At the conclusion of this workshop, patients will be able to understand the types of stress reactions, differentiate between acute and chronic stress, and recognize the impact that chronic stress has on their health. They will also be able to apply different coping mechanisms, such as reframing negative self-talk. Patients will have the opportunity to practice a variety of stress management techniques, such as deep abdominal breathing, progressive muscle relaxation, and/or guided imagery.  The purpose of this lesson is to educate patients on the role of stress in their lives and to provide healthy techniques for coping with it.  Learning Barriers/Preferences:  Learning Barriers/Preferences - 05/24/23 1524       Learning Barriers/Preferences   Learning Barriers None;Exercise Concerns   Samantha Romero reports having left hip pain with walking after walking any extended distance and shortness of breath with exertion   Learning Preferences Skilled Demonstration;Video;Pictoral             Education Topics:  Knowledge Questionnaire Score:  Knowledge Questionnaire Score - 05/24/23 1442       Knowledge Questionnaire Score   Pre Score 19/24             Core Components/Risk Factors/Patient Goals at Admission:  Personal Goals and Risk Factors at Admission - 05/24/23 1403       Core Components/Risk Factors/Patient Goals on  Admission    Weight Management Yes;Obesity;Weight Loss    Intervention Weight Management/Obesity: Establish reasonable short term and long term weight goals.;Obesity: Provide education and appropriate resources to help participant work on and attain dietary goals.    Admit Weight 313 lb 4.4 oz (142.1 kg)    Expected Outcomes Short Term: Continue to assess and modify interventions until short term weight is achieved;Long Term: Adherence to nutrition and physical activity/exercise program aimed toward attainment of established weight goal;Weight Loss: Understanding of general recommendations for a balanced deficit meal plan, which promotes 1-2 lb weight loss per week and includes a negative  energy balance of [phone] kcal/d    Improve shortness of breath with ADL's Yes    Intervention Provide education, individualized exercise plan and daily activity instruction to help decrease symptoms of SOB with activities of daily living.    Expected Outcomes Short Term: Improve cardiorespiratory fitness to achieve a reduction of symptoms when performing ADLs;Long Term: Be able to perform more ADLs without symptoms or delay the onset of symptoms    Heart Failure Yes    Intervention Provide a combined exercise and nutrition program that is supplemented with education, support and counseling about heart failure. Directed toward relieving symptoms such as shortness of breath, decreased exercise tolerance, and extremity edema.    Expected Outcomes Improve functional capacity of life;Short term: Attendance in program 2-3 days a week with increased exercise capacity. Reported lower sodium intake. Reported increased fruit and vegetable intake. Reports medication compliance.;Short term: Daily weights obtained and reported for increase. Utilizing diuretic protocols set by physician.;Long term: Adoption of self-care skills and reduction of barriers for early signs and symptoms recognition and intervention leading to self-care  maintenance.    Hypertension Yes    Intervention Provide education on lifestyle modifcations including regular physical activity/exercise, weight management, moderate sodium restriction and increased consumption of fresh fruit, vegetables, and low fat dairy, alcohol moderation, and smoking cessation.;Monitor prescription use compliance.    Expected Outcomes Short Term: Continued assessment and intervention until BP is < 140/21mm HG in hypertensive participants. < 130/69mm HG in hypertensive participants with diabetes, heart failure or chronic kidney disease.;Long Term: Maintenance of blood pressure at goal levels.    Stress Yes    Intervention Offer individual and/or small group education and counseling on adjustment to heart disease, stress management and health-related lifestyle change. Teach and support self-help strategies.;Refer participants experiencing significant psychosocial distress to appropriate mental health specialists for further evaluation and treatment. When possible, include family members and significant others in education/counseling sessions.    Expected Outcomes Short Term: Participant demonstrates changes in health-related behavior, relaxation and other stress management skills, ability to obtain effective social support, and compliance with psychotropic medications if prescribed.;Long Term: Emotional wellbeing is indicated by absence of clinically significant psychosocial distress or social isolation.             Core Components/Risk Factors/Patient Goals Review:   Goals and Risk Factor Review     Row Name 05/30/23 1716 06/23/23 1034           Core Components/Risk Factors/Patient Goals Review   Personal Goals Review Weight Management/Obesity;Improve shortness of breath with ADL's;Hypertension;Heart Failure;Stress Weight Management/Obesity;Lipids;Stress;Heart Failure;Hypertension      Review Janelie started cardiac rehab on 05/30/23. Deirdre did well with exercise. Vital  signs were stable. Ciarah just returned to exercise after being absent for several days. Uchenna has lost 5 kg since starting cardiac rehab. Vital signs have been stable. Kashia is enjoying group exercise.      Expected Outcomes Teona will continue to participate in cardiac rehab for exercise, nutrition and lifestyle modifications Caitlen will continue to participate in cardiac rehab for exercise, nutrition and lifestyle modifications               Core Components/Risk Factors/Patient Goals at Discharge (Final Review):   Goals and Risk Factor Review - 06/23/23 1034       Core Components/Risk Factors/Patient Goals Review   Personal Goals Review Weight Management/Obesity;Lipids;Stress;Heart Failure;Hypertension    Review Faye just returned to exercise after being absent for several days. Jaleeah has lost 5 kg  since starting cardiac rehab. Vital signs have been stable. Everlie is enjoying group exercise.    Expected Outcomes Ruhama will continue to participate in cardiac rehab for exercise, nutrition and lifestyle modifications             ITP Comments:  ITP Comments     Row Name 05/24/23 1334 05/30/23 1713 06/24/23 0950       ITP Comments Medical Director- Dr. Armanda Magic, MD. Introduction to the Cardiac Rehab Program. Initial orientation folder reviewed. 30 Day ITP Review. Areal started cardiac rehab on 05/30/23. Tove did well with exercise. 30 Day ITP Review. Sireen returned to exercise at cardiac rehab on 06/22/23 after being absent for several days.              Comments: See ITP comments.Thayer Headings RN BSN

## 2023-06-27 ENCOUNTER — Ambulatory Visit (HOSPITAL_COMMUNITY): Payer: Medicaid Other

## 2023-06-27 ENCOUNTER — Encounter (HOSPITAL_COMMUNITY)
Admission: RE | Admit: 2023-06-27 | Discharge: 2023-06-27 | Disposition: A | Discharge status: Home / Self Care | Payer: Medicaid Other | Source: Ambulatory Visit | Attending: Cardiology | Admitting: Cardiology

## 2023-06-27 DIAGNOSIS — I5022 Chronic systolic (congestive) heart failure: Secondary | ICD-10-CM | POA: Diagnosis not present

## 2023-06-29 ENCOUNTER — Ambulatory Visit (HOSPITAL_COMMUNITY): Payer: Medicaid Other

## 2023-06-29 ENCOUNTER — Encounter (HOSPITAL_COMMUNITY): Admission: RE | Admit: 2023-06-29 | Payer: Medicaid Other | Source: Ambulatory Visit

## 2023-07-01 ENCOUNTER — Encounter (HOSPITAL_COMMUNITY)
Admission: RE | Admit: 2023-07-01 | Discharge: 2023-07-01 | Disposition: A | Discharge status: Home / Self Care | Payer: Medicaid Other | Source: Ambulatory Visit | Attending: Cardiology | Admitting: Cardiology

## 2023-07-01 ENCOUNTER — Ambulatory Visit (HOSPITAL_COMMUNITY): Payer: Medicaid Other

## 2023-07-01 DIAGNOSIS — I5022 Chronic systolic (congestive) heart failure: Secondary | ICD-10-CM | POA: Insufficient documentation

## 2023-07-04 ENCOUNTER — Ambulatory Visit (HOSPITAL_COMMUNITY): Payer: Medicaid Other

## 2023-07-04 ENCOUNTER — Encounter (HOSPITAL_COMMUNITY): Admission: RE | Admit: 2023-07-04 | Payer: Medicaid Other | Source: Ambulatory Visit

## 2023-07-04 ENCOUNTER — Other Ambulatory Visit: Payer: Self-pay | Admitting: Adult Health

## 2023-07-04 DIAGNOSIS — G4700 Insomnia, unspecified: Secondary | ICD-10-CM

## 2023-07-06 ENCOUNTER — Encounter (HOSPITAL_COMMUNITY)
Admission: RE | Admit: 2023-07-06 | Discharge: 2023-07-06 | Disposition: A | Discharge status: Home / Self Care | Payer: Medicaid Other | Source: Ambulatory Visit | Attending: Cardiology | Admitting: Cardiology

## 2023-07-06 ENCOUNTER — Ambulatory Visit (HOSPITAL_COMMUNITY): Payer: Medicaid Other

## 2023-07-06 DIAGNOSIS — I5022 Chronic systolic (congestive) heart failure: Secondary | ICD-10-CM

## 2023-07-08 ENCOUNTER — Ambulatory Visit (HOSPITAL_COMMUNITY): Payer: Medicaid Other

## 2023-07-08 ENCOUNTER — Ambulatory Visit
Admission: EM | Admit: 2023-07-08 | Discharge: 2023-07-08 | Disposition: A | Discharge status: Home / Self Care | Attending: Family Medicine | Admitting: Family Medicine

## 2023-07-08 ENCOUNTER — Encounter (HOSPITAL_COMMUNITY): Payer: Medicaid Other

## 2023-07-08 DIAGNOSIS — T07XXXA Unspecified multiple injuries, initial encounter: Secondary | ICD-10-CM

## 2023-07-08 DIAGNOSIS — M25562 Pain in left knee: Secondary | ICD-10-CM

## 2023-07-08 DIAGNOSIS — Z23 Encounter for immunization: Secondary | ICD-10-CM | POA: Diagnosis not present

## 2023-07-08 DIAGNOSIS — S81012A Laceration without foreign body, left knee, initial encounter: Secondary | ICD-10-CM

## 2023-07-08 DIAGNOSIS — W19XXXA Unspecified fall, initial encounter: Secondary | ICD-10-CM

## 2023-07-08 MED ORDER — CEPHALEXIN 500 MG PO CAPS: 500.0000 mg | ORAL_CAPSULE | Freq: Three times a day (TID) | ORAL | 0 refills | Status: DC

## 2023-07-08 MED ORDER — TETANUS-DIPHTH-ACELL PERTUSSIS 5-2.5-18.5 LF-MCG/0.5 IM SUSY
0.5000 mL | PREFILLED_SYRINGE | Freq: Once | INTRAMUSCULAR | Status: AC
  Administered 2023-07-08: 0.5 mL via INTRAMUSCULAR

## 2023-07-08 MED ORDER — BACITRACIN ZINC 500 UNIT/GM EX OINT: 1.0000 | TOPICAL_OINTMENT | Freq: Two times a day (BID) | CUTANEOUS | 0 refills | Status: DC

## 2023-07-08 NOTE — ED Provider Notes (Signed)
 [facility]  Note:  This document was prepared using Conservation officer, historic buildings and may include unintentional dictation errors.  MRN: [id number] DOB: 1975/10/26  Subjective:   Samantha Romero is a 48 y.o. female presenting for suffering a fall from a coffee shop while she was walking down some brick steps yesterday.  No head injury, loss conscious, confusion, weakness, numbness or tingling.  Cannot recall last Tdap.  Patient did apply peroxide, cleansed her wounds, covered them with Band-Aids and Ace wrap's.   No current facility-administered medications for this encounter.  Current Outpatient Medications:    albuterol (PROVENTIL) (2.5 MG/3ML) 0.083% nebulizer solution, Take 3 mLs (2.5 mg total) by nebulization every 6 (six) hours as needed for wheezing or shortness of breath., Disp: 150 mL, Rfl: 1   albuterol (VENTOLIN HFA) 108 (90 Base) MCG/ACT inhaler, Inhale 2 puffs into the lungs every 6 (six) hours as needed for wheezing or shortness of breath., Disp: 8 g, Rfl: 2   ALPRAZolam (XANAX) 1 MG tablet, Take 1 tablet (1 mg total) by mouth 2 (two) times daily as needed for anxiety., Disp: 60 tablet, Rfl: 2   azelastine (ASTELIN) 0.1 % nasal spray, Place 2 sprays into both nostrils 2 (two) times daily., Disp: 90 mL, Rfl: 3   buPROPion (WELLBUTRIN XL) 150 MG 24 hr tablet, TAKE 3 TABLETS BY MOUTH DAILY., Disp: 270 tablet, Rfl: 0   DULoxetine (CYMBALTA) 30 MG capsule, Take 1 capsule (30 mg total) by mouth daily., Disp: 30 capsule, Rfl: 5   DULoxetine (CYMBALTA) 60 MG capsule, Take 1 capsule (60 mg total) by mouth daily., Disp: 30 capsule, Rfl: 5   empagliflozin (JARDIANCE) 10 MG TABS tablet, TAKE ONE TABLET BY MOUTH DAILY BEFORE BREAKFAST, Disp: 90 tablet, Rfl: 3   Fluticasone-Salmeterol 232-14 MCG/ACT AEPB, Inhale 1 puff into the lungs in the morning and at bedtime., Disp: 1 each, Rfl: 5   furosemide (LASIX) 40 MG tablet, Take 1 tablet (40 mg total) by mouth daily.  You may take 1 extra tablet (40 mg total) by mouth daily as needed for lower extremity swelling/weight gain., Disp: 105 tablet, Rfl: 2   levonorgestrel (MIRENA, 52 MG,) 20 MCG/DAY IUD, , Disp: , Rfl:    lisdexamfetamine (VYVANSE) 50 MG capsule, Take 1 capsule (50 mg total) by mouth daily., Disp: 30 capsule, Rfl: 0   lisdexamfetamine (VYVANSE) 50 MG capsule, Take 1 capsule (50 mg total) by mouth daily., Disp: 30 capsule, Rfl: 0   montelukast (SINGULAIR) 10 MG tablet, TAKE 1 TABLET BY MOUTH EVERYDAY AT BEDTIME, Disp: 90 tablet, Rfl: 3   omeprazole (PRILOSEC) 40 MG capsule, TAKE 1 CAPSULE BY MOUTH EVERY DAY, Disp: 90 capsule, Rfl: 3   sacubitril-valsartan (ENTRESTO) 97-103 MG, Take 1 tablet by mouth 2 (two) times daily., Disp: 60 tablet, Rfl: 11   spironolactone (ALDACTONE) 25 MG tablet, Take 1 tablet (25 mg total) by mouth daily., Disp: 90 tablet, Rfl: 3   traZODone (DESYREL) 50 MG tablet, Take 1 tablet (50 mg total) by mouth at bedtime., Disp: 30 tablet, Rfl: 5   carvedilol (COREG) 25 MG tablet, TAKE 1 TABLET (25 MG TOTAL) BY MOUTH TWICE A DAY WITH MEALS, Disp: 180 tablet, Rfl: 3   levocetirizine (XYZAL) 5 MG tablet, Take 1 tablet (5 mg total) by mouth every evening., Disp: 90 tablet, Rfl: 3   Allergies  Allergen Reactions   Grass Extracts [Gramineae Pollens]     Nasal congestion, post nasal drip    Shellfish  Allergy Itching    In throat only.    Past Medical History:  Diagnosis Date   Anxiety    panic attacks   Asthma    CHF (congestive heart failure), NYHA class II, acute, systolic (HCC) 10/06/2018   Depression    Fibromyalgia    Hypertension    no meds currently   No pertinent past medical history    Pneumonia    hx of    Sinus problem      Past Surgical History:  Procedure Laterality Date   ESOPHAGOGASTRODUODENOSCOPY N/A 11/26/2014   Procedure: ESOPHAGOGASTRODUODENOSCOPY (EGD);  Surgeon: Ovidio Kin, MD;  Location: [facility];  Service: General;  Laterality: N/A;   GASTRIC  BANDING PORT REVISION  08/24/11   LAPAROSCOPIC GASTRIC BANDING  12/26/2007   LAPAROSCOPIC LYSIS OF ADHESIONS N/A 01/06/2015   Procedure: LAPAROSCOPIC LYSIS OF ADHESIONS;  Surgeon: Luretha Murphy, MD;  Location: [facility];  Service: General;  Laterality: N/A;   LAPAROSCOPIC REPAIR AND REMOVAL OF GASTRIC BAND N/A 01/06/2015   Procedure: LAPAROSCOPIC RESIGHTING OF GASTRIC Band PORT;  Surgeon: Luretha Murphy, MD;  Location: [facility];  Service: General;  Laterality: N/A;   NO PAST SURGERIES      Family History  Problem Relation Age of Onset   Ulcers Mother    Hypertension Mother    Cancer Maternal Uncle        lung   Cancer Maternal Grandfather        lung and back    Social History   Tobacco Use   Smoking status: Never   Smokeless tobacco: Never  Vaping Use   Vaping status: Never Used  Substance Use Topics   Alcohol use: Never   Drug use: Never    ROS   Objective:   Vitals: BP 128/89 (BP Location: Left Arm)   Pulse (!) 101   Temp 98.1 F (36.7 C) (Oral)   Resp 18   LMP 06/28/2023 (Approximate)   SpO2 96%   Physical Exam Constitutional:      General: She is not in acute distress.    Appearance: Normal appearance. She is well-developed. She is not ill-appearing, toxic-appearing or diaphoretic.  HENT:     Head: Normocephalic and atraumatic.     Nose: Nose normal.     Mouth/Throat:     Mouth: Mucous membranes are moist.  Eyes:     General: No scleral icterus.       Right eye: No discharge.        Left eye: No discharge.     Extraocular Movements: Extraocular movements intact.  Cardiovascular:     Rate and Rhythm: Normal rate.  Pulmonary:     Effort: Pulmonary effort is normal.  Musculoskeletal:     Comments: Multiple abrasions of the left wrist, left knee and left lower leg.  There is a jagged laceration ~1cm over the anterior knee.  Bleeding is controlled.  Patient has full range of motion throughout.  Has tenderness about the wounds after mentioned only.  No fever,  drainage of pus.  Skin:    General: Skin is warm and dry.  Neurological:     General: No focal deficit present.     Mental Status: She is alert and oriented to person, place, and time.  Psychiatric:        Mood and Affect: Mood normal.        Behavior: Behavior normal.    Dressings applied to the multiple abrasions and laceration over the left knee.  Bacitracin applied, covered with nonadherent dressing and secured with Coban.  Tdap updated in clinic.  Assessment and Plan :   PDMP not reviewed this encounter.  1. Acute pain of left knee   2. Accidental fall, initial encounter   3. Abrasions of multiple sites   4. Knee laceration, left, initial encounter    Given age of the wound, recommended that it heal by secondary intention.  Wound care reviewed.  Will start cephalexin as antibiotic prophylaxis for the left knee laceration.  Counseled patient on potential for adverse effects with medications prescribed/recommended today, ER and return-to-clinic precautions discussed, patient verbalized understanding.    Wallis Bamberg, [place] 07/08/23 [id number]

## 2023-07-08 NOTE — ED Triage Notes (Signed)
 Patient presents to the office for left knee pain and swelling after falling down brick steps. Patient states a laceration on her left forearm and left knee pain.

## 2023-07-08 NOTE — Discharge Instructions (Addendum)
 Change your dressing 2-3 times daily. Every time you change your dressing, clean the wound gently with warm water and Dial antibacterial soap. Pat the wound dry, let it breathe for roughly an hour before covering it back up. When you reapply a dressing, apply Bacitracin ointment to the wound, then cover with non-stick/non-adherent gauze.  After 4-7 days, the wound should scab over nicely and then you don't have to continue doing dressings.  Start cephalexin for wound infection prevention.

## 2023-07-11 ENCOUNTER — Ambulatory Visit (HOSPITAL_COMMUNITY): Payer: Medicaid Other

## 2023-07-11 ENCOUNTER — Encounter (HOSPITAL_COMMUNITY)
Admission: RE | Admit: 2023-07-11 | Discharge: 2023-07-11 | Disposition: A | Discharge status: Home / Self Care | Payer: Medicaid Other | Source: Ambulatory Visit | Attending: Cardiology

## 2023-07-11 ENCOUNTER — Telehealth (HOSPITAL_COMMUNITY): Payer: Self-pay | Admitting: *Deleted

## 2023-07-11 ENCOUNTER — Ambulatory Visit: Payer: Self-pay

## 2023-07-11 NOTE — Telephone Encounter (Signed)
  Chief Complaint: fall/wound on left knee Symptoms: pain  Disposition: [] ED /[] Urgent Care (no appt availability in office) / [x] Appointment(In office/virtual)/ []  [facility]/ [] Home Care/ [] Refused Recommended Disposition /[] [facility]/ []  Follow-up with PCP Additional Notes: Pt complaining of left knee pain due to fall on last Thursday. Pt fell down 20 stairs and has scraps on hands/wrist, but gashed left knee.  Pt stated the wound is quarter size. Pt went to [facility] Friday AM but provider wouldn't stitch it up due to "being over 12 hours." Pt stated the pain is increasing. Pt rated pain 7/10. Pt was given tetanus shot and antibiotic. Pt has appt tomorrow AM @0940 . RN gave care advice and pt verbalized understanding.            Copied from CRM [id number]. Topic: Clinical - Red Word Triage >> Jul 11, 2023  2:26 PM Oddis Bench wrote: Red Word that prompted transfer to Nurse Triage: Patient is calling bc she had a fall on Thursday and she is having severe paing and the wound is open bc she came in 24 hrs later and it could not be stitched up. Reason for Disposition  MILD weakness (i.e., does not interfere with ability to work, go to school, normal activities)  (Exception: Mild weakness is a chronic symptom.)  Answer Assessment - Initial Assessment Questions 1. MECHANISM: "How did the fall happen?"     Fell on uneven stairs and hit brick stairs 2. DOMESTIC VIOLENCE AND ELDER ABUSE SCREENING: "Did you fall because someone pushed you or tried to hurt you?" If Yes, ask: "Are you safe now?"     na 3. ONSET: "When did the fall happen?" (e.g., minutes, hours, or days ago)     Last Thursday  4. LOCATION: "What part of the body hit the ground?" (e.g., back, buttocks, head, hips, knees, hands, head, stomach)     Hands, wrist,  left knee 5. INJURY: "Did you hurt (injure) yourself when you fell?" If Yes, ask: "What did you injure? Tell me more about this?" (e.g., body area; type of  injury; pain severity)"     yes 6. PAIN: "Is there any pain?" If Yes, ask: "How bad is the pain?" (e.g., Scale 1-10; or mild,  moderate, severe)   - NONE (0): No pain   - MILD (1-3): Doesn't interfere with normal activities    - MODERATE (4-7): Interferes with normal activities or awakens from sleep    - SEVERE (8-10): Excruciating pain, unable to do any normal activities      7 7. SIZE: For cuts, bruises, or swelling, ask: "How large is it?" (e.g., inches or centimeters)      Quarter size  9. OTHER SYMPTOMS: "Do you have any other symptoms?" (e.g., dizziness, fever, weakness; new onset or worsening).      na 10. CAUSE: "What do you think caused the fall (or falling)?" (e.g., tripped, dizzy spell)       Uneven stairs  Protocols used: Falls and [facility]

## 2023-07-11 NOTE — Telephone Encounter (Signed)
 Samantha Romero said she fell down several steps on Thursday. Went to urgent care has several lacerations and is on an antibiotic for the rest of the week. Advised Bekki to stay out the rest of the week and to follow up with her PCP if necessary for further instructions. Gladyce says she hopes to return to exercise on next Monday if she heals appropriately and would like to make up the time. Patient states understanding.Monte Antonio RN BSN

## 2023-07-12 ENCOUNTER — Encounter: Payer: Self-pay | Admitting: Medical

## 2023-07-12 ENCOUNTER — Ambulatory Visit: Admitting: Medical

## 2023-07-12 ENCOUNTER — Ambulatory Visit (HOSPITAL_BASED_OUTPATIENT_CLINIC_OR_DEPARTMENT_OTHER)
Admission: RE | Admit: 2023-07-12 | Discharge: 2023-07-12 | Disposition: A | Discharge status: Home / Self Care | Source: Ambulatory Visit | Attending: Medical | Admitting: Medical

## 2023-07-12 VITALS — BP 110/62 | HR 94 | Temp 98.2°F | Resp 18 | Ht 62.0 in | Wt 306.0 lb

## 2023-07-12 DIAGNOSIS — M79632 Pain in left forearm: Secondary | ICD-10-CM

## 2023-07-12 DIAGNOSIS — M25532 Pain in left wrist: Secondary | ICD-10-CM | POA: Diagnosis present

## 2023-07-12 DIAGNOSIS — S0990XD Unspecified injury of head, subsequent encounter: Secondary | ICD-10-CM

## 2023-07-12 DIAGNOSIS — R739 Hyperglycemia, unspecified: Secondary | ICD-10-CM | POA: Diagnosis not present

## 2023-07-12 DIAGNOSIS — G4452 New daily persistent headache (NDPH): Secondary | ICD-10-CM

## 2023-07-12 DIAGNOSIS — M25562 Pain in left knee: Secondary | ICD-10-CM

## 2023-07-12 NOTE — Progress Notes (Unsigned)
 Subjective:    Patient ID: Samantha Romero, female    DOB: [birth year], 48 y.o.   MRN: [id number]  HPI Discussed the use of AI scribe software for clinical note transcription with the patient, who gave verbal consent to proceed.  History of Present Illness   Samantha Romero is a 48 year old female who presents with a left leg injury following a fall.  She fell down more than ten brick steps and landed on concrete on Thursday evening. Initially, she thought the moisture she felt was from a spilled drink, but later realized it was blood. She sought care the next day at urgent care, where it was noted that stitches would have been appropriate, but due to the time elapsed, the wound was treated with ointment and bandages instead. She received a tetanus shot and was prescribed Cephalexin, 500 mg, to be taken three times a day for seven days. She has been changing her bandages three times a day and applying Neosporin. No yellow discharge, only dry blood. She is experiencing significant pain and difficulty with mobility, particularly getting in and out of the shower. She has been taking Advil for pain relief but has not added Tylenol.  She reports pain in her left wrist, which is sore but she can bend and flex it. There is no pain in her hand, but the wrist and mid forearm are sore.  She has a headache on the right side of her head, near the temple, which has been throbbing on and off since the fall. The pain is moderate, rated at 5-6 out of 10, and has not improved over the past five days. No memory loss, dizziness, nausea, or vertigo.  Her blood pressure is currently well-controlled with medication, and she is participating in a cardiac rehabilitation program three times a week. Her diet has improved significantly, focusing on fruits, vegetables, and limited animal proteins.        Review of Systems  Constitutional:  Negative for chills, fatigue and fever.  Respiratory:  Negative for cough, chest  tightness, shortness of breath and wheezing.   Cardiovascular:  Negative for chest pain and palpitations.  Gastrointestinal:  Negative for abdominal pain, nausea and vomiting.  Musculoskeletal:        See hpi.  Skin:  Negative for rash.       Abrasion/laceration.  Neurological:  Positive for headaches. Negative for dizziness, syncope, speech difficulty, weakness and numbness.  Hematological:  Negative for adenopathy. Does not bruise/bleed easily.  Psychiatric/Behavioral:  Negative for behavioral problems, decreased concentration and dysphoric mood.     Past Medical History:  Diagnosis Date   Anxiety    panic attacks   Asthma    CHF (congestive heart failure), NYHA class II, acute, systolic (HCC) 10/06/2018   Depression    Fibromyalgia    Hypertension    no meds currently   No pertinent past medical history    Pneumonia    hx of    Sinus problem      Social History   Socioeconomic History   Marital status: Single    Spouse name: Not on file   Number of children: 0   Years of education: 16   Highest education level: Bachelor's degree (e.g., BA, AB, BS)  Occupational History   Not on file  Tobacco Use   Smoking status: Never   Smokeless tobacco: Never  Vaping Use   Vaping status: Never Used  Substance and Sexual Activity   Alcohol use: Never  Drug use: Never   Sexual activity: Not Currently    Birth control/protection: I.U.D.    Comment: Mirena  Other Topics Concern   Not on file  Social History Narrative   Not on file   Social Drivers of Health   Financial Resource Strain: Not on file  Food Insecurity: Not on file  Transportation Needs: Not on file  Physical Activity: Not on file  Stress: Not on file  Social Connections: Not on file  Intimate Partner Violence: Not on file    Past Surgical History:  Procedure Laterality Date   ESOPHAGOGASTRODUODENOSCOPY N/A 11/26/2014   Procedure: ESOPHAGOGASTRODUODENOSCOPY (EGD);  Surgeon: Ovidio Kin, MD;  Location:  [facility];  Service: General;  Laterality: N/A;   GASTRIC BANDING PORT REVISION  08/24/11   LAPAROSCOPIC GASTRIC BANDING  12/26/2007   LAPAROSCOPIC LYSIS OF ADHESIONS N/A 01/06/2015   Procedure: LAPAROSCOPIC LYSIS OF ADHESIONS;  Surgeon: Luretha Murphy, MD;  Location: [facility];  Service: General;  Laterality: N/A;   LAPAROSCOPIC REPAIR AND REMOVAL OF GASTRIC BAND N/A 01/06/2015   Procedure: LAPAROSCOPIC RESIGHTING OF GASTRIC Band PORT;  Surgeon: Luretha Murphy, MD;  Location: [facility];  Service: General;  Laterality: N/A;   NO PAST SURGERIES      Family History  Problem Relation Age of Onset   Ulcers Mother    Hypertension Mother    Cancer Maternal Uncle        lung   Cancer Maternal Grandfather        lung and back    Allergies  Allergen Reactions   Grass Extracts [Gramineae Pollens]     Nasal congestion, post nasal drip    Shellfish Allergy Itching    In throat only.    Current Outpatient Medications on File Prior to Visit  Medication Sig Dispense Refill   albuterol (PROVENTIL) (2.5 MG/3ML) 0.083% nebulizer solution Take 3 mLs (2.5 mg total) by nebulization every 6 (six) hours as needed for wheezing or shortness of breath. 150 mL 1   albuterol (VENTOLIN HFA) 108 (90 Base) MCG/ACT inhaler Inhale 2 puffs into the lungs every 6 (six) hours as needed for wheezing or shortness of breath. 8 g 2   ALPRAZolam (XANAX) 1 MG tablet Take 1 tablet (1 mg total) by mouth 2 (two) times daily as needed for anxiety. 60 tablet 2   azelastine (ASTELIN) 0.1 % nasal spray Place 2 sprays into both nostrils 2 (two) times daily. 90 mL 3   bacitracin ointment Apply 1 Application topically 2 (two) times daily. 120 g 0   buPROPion (WELLBUTRIN XL) 150 MG 24 hr tablet TAKE 3 TABLETS BY MOUTH DAILY. 270 tablet 0   carvedilol (COREG) 25 MG tablet TAKE 1 TABLET (25 MG TOTAL) BY MOUTH TWICE A DAY WITH MEALS 180 tablet 3   cephALEXin (KEFLEX) 500 MG capsule Take 1 capsule (500 mg total) by mouth 3 (three) times  daily. 21 capsule 0   DULoxetine (CYMBALTA) 30 MG capsule Take 1 capsule (30 mg total) by mouth daily. 30 capsule 5   DULoxetine (CYMBALTA) 60 MG capsule Take 1 capsule (60 mg total) by mouth daily. 30 capsule 5   empagliflozin (JARDIANCE) 10 MG TABS tablet TAKE ONE TABLET BY MOUTH DAILY BEFORE BREAKFAST 90 tablet 3   Fluticasone-Salmeterol 232-14 MCG/ACT AEPB Inhale 1 puff into the lungs in the morning and at bedtime. 1 each 5   furosemide (LASIX) 40 MG tablet Take 1 tablet (40 mg total) by mouth daily. You may take 1 extra tablet (  40 mg total) by mouth daily as needed for lower extremity swelling/weight gain. 105 tablet 2   levocetirizine (XYZAL) 5 MG tablet Take 1 tablet (5 mg total) by mouth every evening. 90 tablet 3   levonorgestrel (MIRENA, 52 MG,) 20 MCG/DAY IUD      lisdexamfetamine (VYVANSE) 50 MG capsule Take 1 capsule (50 mg total) by mouth daily. 30 capsule 0   lisdexamfetamine (VYVANSE) 50 MG capsule Take 1 capsule (50 mg total) by mouth daily. 30 capsule 0   montelukast (SINGULAIR) 10 MG tablet TAKE 1 TABLET BY MOUTH EVERYDAY AT BEDTIME 90 tablet 3   omeprazole (PRILOSEC) 40 MG capsule TAKE 1 CAPSULE BY MOUTH EVERY DAY 90 capsule 3   sacubitril-valsartan (ENTRESTO) 97-103 MG Take 1 tablet by mouth 2 (two) times daily. 60 tablet 11   spironolactone (ALDACTONE) 25 MG tablet Take 1 tablet (25 mg total) by mouth daily. 90 tablet 3   traZODone (DESYREL) 50 MG tablet Take 1 tablet (50 mg total) by mouth at bedtime. 30 tablet 5   [DISCONTINUED] mometasone-formoterol (DULERA) 200-5 MCG/ACT AERO Inhale 2 puffs into the lungs 2 (two) times a day. 13 g 5   No current facility-administered medications on file prior to visit.    BP 110/62   Pulse 94   Temp 98.2 F (36.8 C)   Resp 18   Ht 5\' 2"  (1.575 m)   Wt (!) 306 lb (138.8 kg)   LMP 06/28/2023 (Approximate)   SpO2 98%   BMI 55.97 kg/m         Objective:   Physical Exam   General Mental Status- Alert. General Appearance-  Not in acute distress.   Skin General: Color- Normal Color. Moisture- Normal Moisture.  Neck Carotid Arteries- Normal color. Moisture- Normal Moisture. No carotid bruits. No JVD.  Chest and Lung Exam Auscultation: Breath Sounds:-Normal.  Cardiovascular Auscultation:Rythm- Regular. Murmurs & Other Heart Sounds:Auscultation of the heart reveals- No Murmurs.  Abdomen Inspection:-Inspeection Normal. Palpation/Percussion:Note:No mass. Palpation and Percussion of the abdomen reveal- Non Tender, Non Distended + BS, no rebound or guarding.    Neurologic Cranial Nerve exam:- CN III-XII intact(No nystagmus), symmetric smile. Drift Test:- No drift. Finger to Nose:- Normal/Intact Strength:- 5/5 equal and symmetric strength both upper and lower extremities.   Left knee- mid patella 1cm x 2 cm scab with mid aspect laceration healing by secondary intention. No dc. No redness.   Left forearm- no swelling but mid aspect mild tender. Left wrist- tender to palpation mild. But no pain at all in the anatomic snuff box/no scaphoid pain    Assessment & Plan:   Assessment and Plan    Left knee injury Concern for patella fracture due to fall mechanism and pain location. X-ray needed to rule out. - Order x-ray of the left knee to assess for patella fracture. - Continue Cefalexin as prescribed. - Advise combination of Tylenol and ibuprofen for pain management. - Consider referral to sports medicine or orthopedics if fracture is present.   left knee patella abrasion/lacertation. -healing by secondary intention. tetanus update at [facility]. -continue antibiotic. -can use light compression with ace wrap to reduce range of motion but not effect gate. -at home at rest leave open to air.  Left wrist and forearm pain Suspected soft tissue injury. X-ray needed to rule out fractures. - Order x-ray of the left wrist and forearm to rule out fractures.  Right temporal headache Concern for concussion or  intracranial injury due to fall and persistent headache. CT scan  needed for assessment. - Order CT of the head without contrast to assess for intracranial injury. - Advise rest and avoidance of activities that strain the brain, such as work and screen time, for seven days.(possible concussion discussed thus brain rest recommeneded)  Prediabetes Elevated blood sugar levels may delay wound healing. A1c test planned for glycemic control assessment. - Order A1c test to assess current glycemic control.  Hypertension Well-controlled with medication and lifestyle changes. - Continue current hypertension management and lifestyle modifications.  General Health Maintenance Tetanus booster received. Neosporin confirmed as suitable for wound care. - Continue using Neosporin for wound care.  Follow-up Follow-up needed to determine further management based on test results. - Follow up in 7-10 days with Rice Chamorro or self. - Coordinate follow-up based on x-ray and CT results.        Time spent with patient today was  41 minutes which consisted of chart revdew, discussing diagnosis, work up, treatment and documentation.

## 2023-07-12 NOTE — Patient Instructions (Signed)
 Left knee injury Concern for patella fracture due to fall mechanism and pain location. X-ray needed for confirmation. - Order x-ray of the left knee to assess for patella fracture. - Continue Cefalexin as prescribed. - Advise combination of Tylenol and ibuprofen for pain management. - Consider referral to sports medicine or orthopedics if fracture is present.  left knee patella abrasion/lacertation. -healing by secondary intention. tetanus update at [facility] -continue antibiotic. -can use light compression with ace wrap to reduce range of motion but not effect gate. -at home at rest leave open to air.  Left wrist and forearm pain Suspected soft tissue injury. X-ray needed to rule out fractures. - Order x-ray of the left wrist and forearm to rule out fractures.  Right temporal headache Concern for concussion or intracranial injury due to fall and persistent headache. CT scan needed for assessment. - Order CT of the head without contrast to assess for intracranial injury. - Advise rest and avoidance of activities that strain the brain, such as work and screen time, for seven days.(possible concussion discussed thus brain rest recommeneded)  Prediabetes Elevated blood sugar levels may delay wound healing. A1c test planned for glycemic control assessment. - Order A1c test to assess current glycemic control.  Hypertension Well-controlled with medication and lifestyle changes. - Continue current hypertension management and lifestyle modifications.  General Health Maintenance Tetanus booster received. Neosporin confirmed as suitable for wound care. - Continue using Neosporin for wound care.  Follow-up Follow-up needed to determine further management based on test results. - Follow up in 7-10 days with Rice Chamorro or self. - Coordinate follow-up based on x-ray and CT results.

## 2023-07-13 ENCOUNTER — Other Ambulatory Visit (HOSPITAL_BASED_OUTPATIENT_CLINIC_OR_DEPARTMENT_OTHER)

## 2023-07-13 ENCOUNTER — Ambulatory Visit (HOSPITAL_COMMUNITY): Payer: Medicaid Other

## 2023-07-13 ENCOUNTER — Ambulatory Visit (HOSPITAL_BASED_OUTPATIENT_CLINIC_OR_DEPARTMENT_OTHER): Admission: RE | Admit: 2023-07-13 | Source: Ambulatory Visit

## 2023-07-13 ENCOUNTER — Telehealth (HOSPITAL_BASED_OUTPATIENT_CLINIC_OR_DEPARTMENT_OTHER): Payer: Self-pay

## 2023-07-13 ENCOUNTER — Encounter (HOSPITAL_COMMUNITY): Payer: Medicaid Other

## 2023-07-15 ENCOUNTER — Telehealth (HOSPITAL_COMMUNITY): Payer: Self-pay | Admitting: *Deleted

## 2023-07-15 ENCOUNTER — Encounter (HOSPITAL_COMMUNITY): Payer: Self-pay | Admitting: *Deleted

## 2023-07-15 ENCOUNTER — Ambulatory Visit (HOSPITAL_COMMUNITY): Payer: Medicaid Other

## 2023-07-15 ENCOUNTER — Encounter (HOSPITAL_COMMUNITY): Payer: Medicaid Other

## 2023-07-15 DIAGNOSIS — I5022 Chronic systolic (congestive) heart failure: Secondary | ICD-10-CM

## 2023-07-15 NOTE — Progress Notes (Signed)
 Cardiac Individual Treatment Plan  Patient Details  Name: Samantha Romero MRN: [id number] Date of Birth: [birth year] Referring Provider:   Flowsheet Row CARDIAC REHAB PHASE II ORIENTATION from 05/24/2023 in [facility]  Referring Provider Hugh Madura, MD       Initial Encounter Date:  Flowsheet Row CARDIAC REHAB PHASE II ORIENTATION from 05/24/2023 in [facility]  Date 05/24/23       Visit Diagnosis: Heart failure, chronic systolic (HCC)  Patient's Home Medications on Admission:  Current Outpatient Medications:    albuterol  (PROVENTIL ) (2.5 MG/3ML) 0.083% nebulizer solution, Take 3 mLs (2.5 mg total) by nebulization every 6 (six) hours as needed for wheezing or shortness of breath., Disp: 150 mL, Rfl: 1   albuterol  (VENTOLIN  HFA) 108 (90 Base) MCG/ACT inhaler, Inhale 2 puffs into the lungs every 6 (six) hours as needed for wheezing or shortness of breath., Disp: 8 g, Rfl: 2   ALPRAZolam  (XANAX ) 1 MG tablet, Take 1 tablet (1 mg total) by mouth 2 (two) times daily as needed for anxiety., Disp: 60 tablet, Rfl: 2   azelastine  (ASTELIN ) 0.1 % nasal spray, Place 2 sprays into both nostrils 2 (two) times daily., Disp: 90 mL, Rfl: 3   bacitracin  ointment, Apply 1 Application topically 2 (two) times daily., Disp: 120 g, Rfl: 0   buPROPion  (WELLBUTRIN  XL) 150 MG 24 hr tablet, TAKE 3 TABLETS BY MOUTH DAILY., Disp: 270 tablet, Rfl: 0   carvedilol  (COREG ) 25 MG tablet, TAKE 1 TABLET (25 MG TOTAL) BY MOUTH TWICE A DAY WITH MEALS, Disp: 180 tablet, Rfl: 3   cephALEXin  (KEFLEX ) 500 MG capsule, Take 1 capsule (500 mg total) by mouth 3 (three) times daily., Disp: 21 capsule, Rfl: 0   DULoxetine  (CYMBALTA ) 30 MG capsule, Take 1 capsule (30 mg total) by mouth daily., Disp: 30 capsule, Rfl: 5   DULoxetine  (CYMBALTA ) 60 MG capsule, Take 1 capsule (60 mg total) by mouth daily., Disp: 30  capsule, Rfl: 5   empagliflozin  (JARDIANCE ) 10 MG TABS tablet, TAKE ONE TABLET BY MOUTH DAILY BEFORE BREAKFAST, Disp: 90 tablet, Rfl: 3   Fluticasone -Salmeterol 232-14 MCG/ACT AEPB, Inhale 1 puff into the lungs in the morning and at bedtime., Disp: 1 each, Rfl: 5   furosemide  (LASIX ) 40 MG tablet, Take 1 tablet (40 mg total) by mouth daily. You may take 1 extra tablet (40 mg total) by mouth daily as needed for lower extremity swelling/weight gain., Disp: 105 tablet, Rfl: 2   levocetirizine (XYZAL ) 5 MG tablet, Take 1 tablet (5 mg total) by mouth every evening., Disp: 90 tablet, Rfl: 3   levonorgestrel (MIRENA, 52 MG,) 20 MCG/DAY IUD, , Disp: , Rfl:    lisdexamfetamine (VYVANSE ) 50 MG capsule, Take 1 capsule (50 mg total) by mouth daily., Disp: 30 capsule, Rfl: 0   lisdexamfetamine (VYVANSE ) 50 MG capsule, Take 1 capsule (50 mg total) by mouth daily., Disp: 30 capsule, Rfl: 0   montelukast  (SINGULAIR ) 10 MG tablet, TAKE 1 TABLET BY MOUTH EVERYDAY AT BEDTIME, Disp: 90 tablet, Rfl: 3   omeprazole  (PRILOSEC) 40 MG capsule, TAKE 1 CAPSULE BY MOUTH EVERY DAY, Disp: 90 capsule, Rfl: 3   sacubitril -valsartan  (ENTRESTO ) 97-103 MG, Take 1 tablet by mouth 2 (two) times daily., Disp: 60 tablet, Rfl: 11   spironolactone  (ALDACTONE ) 25 MG tablet, Take 1 tablet (25 mg total) by mouth daily., Disp: 90 tablet, Rfl: 3  traZODone  (DESYREL ) 50 MG tablet, Take 1 tablet (50 mg total) by mouth at bedtime., Disp: 30 tablet, Rfl: 5  Past Medical History: Past Medical History:  Diagnosis Date   Anxiety    panic attacks   Asthma    CHF (congestive heart failure), NYHA class II, acute, systolic (HCC) 10/06/2018   Depression    Fibromyalgia    Hypertension    no meds currently   No pertinent past medical history    Pneumonia    hx of    Sinus problem     Tobacco Use: Social History   Tobacco Use  Smoking Status Never  Smokeless Tobacco Never    Labs: Review Flowsheet       Latest Ref Rng & Units  08/17/2019 11/19/2019 05/08/2020 01/06/2022  Labs for ITP Cardiac and Pulmonary Rehab  Cholestrol 100 - 199 mg/dL [phone]  454  - -  LDL (calc) 0 - 99 mg/dL 82  91  - -  HDL-C >09 mg/dL 53  50  - -  Trlycerides 0 - 149 mg/dL 811  93  - -  Hemoglobin A1c 4.8 - 5.6 % 6.3  - 6.0  5.9     Capillary Blood Glucose: No results found for: "GLUCAP"   Exercise Target Goals: Exercise Program Goal: Individual exercise prescription set using results from initial 6 min walk test and THRR while considering  patient's activity barriers and safety.   Exercise Prescription Goal: Initial exercise prescription builds to 30-45 minutes a day of aerobic activity, 2-3 days per week.  Home exercise guidelines will be given to patient during program as part of exercise prescription that the participant will acknowledge.  Activity Barriers & Risk Stratification:  Activity Barriers & Cardiac Risk Stratification - 05/24/23 1404       Activity Barriers & Cardiac Risk Stratification   Activity Barriers Fibromyalgia;Deconditioning;Other (comment);Shortness of Breath;Assistive Device    Comments Left hip pain. She has a cane but not currently using.    Cardiac Risk Stratification Moderate             6 Minute Walk:  6 Minute Walk     Row Name 05/24/23 1513         6 Minute Walk   Phase Initial     Distance 900 feet     Walk Time 6 minutes     # of Rest Breaks 0     MPH 1.7     METS 2.8     RPE 12     Perceived Dyspnea  1     VO2 Peak 9.81     Symptoms Yes (comment)     Comments Mild shortness of breath.     Resting HR 105 bpm     Resting BP 128/78     Resting Oxygen  Saturation  97 %     Exercise Oxygen  Saturation  during 6 min walk 98 %     Max Ex. HR 140 bpm     Max Ex. BP 172/82     2 Minute Post BP 158/78              Oxygen  Initial Assessment:   Oxygen  Re-Evaluation:   Oxygen  Discharge (Final Oxygen  Re-Evaluation):   Initial Exercise Prescription:  Initial Exercise  Prescription - 05/24/23 1500       Date of Initial Exercise RX and Referring Provider   Date 05/24/23    Referring Provider Hugh Madura, MD    Expected Discharge Date 08/17/23  Treadmill   MPH 1.7    Grade 0    Minutes 10    METs 2.3      T5 Nustep   Level 1    SPM 85    Minutes 20    METs 2.3      Prescription Details   Frequency (times per week) 3    Duration Progress to 30 minutes of continuous aerobic without signs/symptoms of physical distress      Intensity   THRR 40-80% of Max Heartrate 70-139    Ratings of Perceived Exertion 11-13    Perceived Dyspnea 0-4      Progression   Progression Continue to progress workloads to maintain intensity without signs/symptoms of physical distress.      Resistance Training   Training Prescription Yes    Weight 2 lbs    Reps 10-15             Perform Capillary Blood Glucose checks as needed.  Exercise Prescription Changes:   Exercise Prescription Changes     Row Name 05/30/23 1709 06/24/23 1628           Response to Exercise   Blood Pressure (Admit) 116/64 118/62      Blood Pressure (Exercise) 138/68 122/74      Blood Pressure (Exit) 124/86 104/60      Heart Rate (Admit) 80 bpm 93 bpm      Heart Rate (Exercise) 130 bpm 128 bpm      Heart Rate (Exit) 89 bpm 103 bpm      Rating of Perceived Exertion (Exercise) 10.5 11      Perceived Dyspnea (Exercise) 0 0      Symptoms 0 0      Comments Pt first day in the [facility] program Reviewed MET's, goals and home ExRx      Duration Progress to 30 minutes of  aerobic without signs/symptoms of physical distress Progress to 30 minutes of  aerobic without signs/symptoms of physical distress      Intensity THRR unchanged THRR unchanged        Progression   Progression Continue to progress workloads to maintain intensity without signs/symptoms of physical distress. Continue to progress workloads to maintain intensity without signs/symptoms of physical distress.       Average METs 2.15 2.8        Resistance Training   Training Prescription Yes Yes      Weight 2 lbs 2 lbs      Reps 10-15 10-15      Time 10 Minutes 10 Minutes        Treadmill   MPH 1.7 --      Grade 0 --      Minutes 10 --      METs 2.3 --        T5 Nustep   Level 1 3      SPM 88 111      Minutes 20 30      METs 10 2.8  05/30/23 ERROR on MET's. True MET's were 2.0, not 10               Exercise Comments:   Exercise Comments     Row Name 05/30/23 1712 06/24/23 1638 07/18/23 1325       Exercise Comments Pt first day in the [facility] program. Pt tolerated exercise well with an average MET level of 2.15. Pt is learning her THRR, RPE and ExRx. Will continue to monitor pt and progress  workloads as tolerated without sign or symptom Reviewed MET's, goals and home ExRx. Pt tolerated exercise well with an average MET level of 2.8. She is increasing WL's and MET's. She feels good about her goals and is increasing sttrength and stamina, she is also feeling in better control of her SOB. She is going to add in 2 days of exercise for about 30-45 mins by trying walking, [employer], dance Fit, Better Me app, and strength training. Reviewed gradual progression with weight lifting. Patient absent due to a fall since 4/9. Will review education when cleared to return              Exercise Goals and Review:   Exercise Goals     Row Name 05/24/23 1407             Exercise Goals   Increase Physical Activity Yes       Intervention Provide advice, education, support and counseling about physical activity/exercise needs.;Develop an individualized exercise prescription for aerobic and resistive training based on initial evaluation findings, risk stratification, comorbidities and participant's personal goals.       Expected Outcomes Short Term: Attend rehab on a regular basis to increase amount of physical activity.;Long Term: Exercising regularly at least 3-5 days a week.;Long  Term: Add in home exercise to make exercise part of routine and to increase amount of physical activity.       Increase Strength and Stamina Yes       Intervention Provide advice, education, support and counseling about physical activity/exercise needs.;Develop an individualized exercise prescription for aerobic and resistive training based on initial evaluation findings, risk stratification, comorbidities and participant's personal goals.       Expected Outcomes Short Term: Increase workloads from initial exercise prescription for resistance, speed, and METs.;Short Term: Perform resistance training exercises routinely during rehab and add in resistance training at home;Long Term: Improve cardiorespiratory fitness, muscular endurance and strength as measured by increased METs and functional capacity ( )       Able to understand and use rate of perceived exertion (RPE) scale Yes       Intervention Provide education and explanation on how to use RPE scale       Expected Outcomes Short Term: Able to use RPE daily in rehab to express subjective intensity level;Long Term:  Able to use RPE to guide intensity level when exercising independently       Knowledge and understanding of Target Heart Rate Range (THRR) Yes       Intervention Provide education and explanation of THRR including how the numbers were predicted and where they are located for reference       Expected Outcomes Short Term: Able to state/look up THRR;Long Term: Able to use THRR to govern intensity when exercising independently;Short Term: Able to use daily as guideline for intensity in rehab       Able to check pulse independently Yes       Intervention Provide education and demonstration on how to check pulse in carotid and radial arteries.;Review the importance of being able to check your own pulse for safety during independent exercise       Expected Outcomes Short Term: Able to explain why pulse checking is important during independent  exercise;Long Term: Able to check pulse independently and accurately       Understanding of Exercise Prescription Yes       Intervention Provide education, explanation, and written materials on patient's individual exercise prescription  Expected Outcomes Short Term: Able to explain program exercise prescription;Long Term: Able to explain home exercise prescription to exercise independently                Exercise Goals Re-Evaluation :  Exercise Goals Re-Evaluation     Row Name 05/30/23 1711 06/24/23 1634           Exercise Goal Re-Evaluation   Exercise Goals Review Increase Physical Activity;Understanding of Exercise Prescription;Increase Strength and Stamina;Knowledge and understanding of Target Heart Rate Range (THRR);Able to understand and use rate of perceived exertion (RPE) scale Increase Physical Activity;Understanding of Exercise Prescription;Increase Strength and Stamina;Knowledge and understanding of Target Heart Rate Range (THRR);Able to understand and use rate of perceived exertion (RPE) scale      Comments Pt first day in the [facility] program. Pt tolerated exercise well with an average MET level of 2.15. Pt is learning her THRR, RPE and ExRx. Reviewed MET's, goals and home ExRx. Pt tolerated exercise well with an average MET level of 2.8. She is increasing WL's and MET's. She feels good about her goals and is increasing sttrength and stamina, she is also feeling in better control of her SOB. She is going to add in 2 days of exercise for about 30-45 mins by trying walking, [employer], dance Fit, Better Me app, and strength training. Reviewed gradual progression with weight lifting.      Expected Outcomes Will continue to monitor pt and progress workloads as tolerated without sign or symptom Will continue to monitor pt and progress workloads as tolerated without sign or symptom               Discharge Exercise Prescription (Final Exercise Prescription Changes):   Exercise Prescription Changes - 06/24/23 1628       Response to Exercise   Blood Pressure (Admit) 118/62    Blood Pressure (Exercise) 122/74    Blood Pressure (Exit) 104/60    Heart Rate (Admit) 93 bpm    Heart Rate (Exercise) 128 bpm    Heart Rate (Exit) 103 bpm    Rating of Perceived Exertion (Exercise) 11    Perceived Dyspnea (Exercise) 0    Symptoms 0    Comments Reviewed MET's, goals and home ExRx    Duration Progress to 30 minutes of  aerobic without signs/symptoms of physical distress    Intensity THRR unchanged      Progression   Progression Continue to progress workloads to maintain intensity without signs/symptoms of physical distress.    Average METs 2.8      Resistance Training   Training Prescription Yes    Weight 2 lbs    Reps 10-15    Time 10 Minutes      T5 Nustep   Level 3    SPM 111    Minutes 30    METs 2.8   05/30/23 ERROR on MET's. True MET's were 2.0, not 10            Nutrition:  Target Goals: Understanding of nutrition guidelines, daily intake of sodium 1500mg , cholesterol 200mg , calories 30% from fat and 7% or less from saturated fats, daily to have 5 or more servings of fruits and vegetables.  Biometrics:  Pre Biometrics - 05/24/23 1334       Pre Biometrics   Waist Circumference 61.5 inches    Hip Circumference 63 inches    Waist to Hip Ratio 0.98 %    Triceps Skinfold 45 mm    % Body Fat 65.7 %  Grip Strength 31 kg    Flexibility 11 in    Single Leg Stand 30 seconds              Nutrition Therapy Plan and Nutrition Goals:  Nutrition Therapy & Goals - 07/13/23 1442       Nutrition Therapy   Diet Heart Healthy Diet      Personal Nutrition Goals   Nutrition Goal Patient to identify strategies for reducing cardiovascular risk by attending the [facility] education and nutrition series weekly.    Personal Goal #2 Patient to improve diet quality by using the plate method as a guide for meal planning to include lean  protein/plant protein, fruits, vegetables, whole grains, nonfat dairy as part of a well-balanced diet.    Personal Goal #3 Patient to reduce sodium intake to 1500mg  per day.    Personal Goal #4 Patient to identify strategies for weight loss of 0.5-2.0# per week.    Comments Goals in progress. Channel has medical history of CHF, HTN, lap band, OSA. She has started making many dietary changes including increased dietary fiber, decreased sodium intake, and decreased simple sugars. She is down 12.8# since starting with our program. She continues to attend the [facility] education and nutrition series regularly. Patient will continue to benefit from participation in intensive cardiac rehab for nutrition, exercise, and lifestyle modification.      Intervention Plan   Intervention Prescribe, educate and counsel regarding individualized specific dietary modifications aiming towards targeted core components such as weight, hypertension, lipid management, diabetes, heart failure and other comorbidities.;Nutrition handout(s) given to patient.    Expected Outcomes Short Term Goal: Understand basic principles of dietary content, such as calories, fat, sodium, cholesterol and nutrients.;Long Term Goal: Adherence to prescribed nutrition plan.             Nutrition Assessments:  Nutrition Assessments - 06/02/23 1238       Rate Your Plate Scores   Pre Score 48            MEDIFICTS Score Key: >=70 Need to make dietary changes  40-70 Heart Healthy Diet <= 40 Therapeutic Level Cholesterol Diet   Flowsheet Row CARDIAC REHAB PHASE II EXERCISE from 06/01/2023 in [facility]  Picture Your Plate Total Score on Admission 48      Picture Your Plate Scores: <16 Unhealthy dietary pattern with much room for improvement. 41-50 Dietary pattern unlikely to meet recommendations for good health and room for improvement. 51-60 More healthful dietary pattern,  with some room for improvement.  >60 Healthy dietary pattern, although there may be some specific behaviors that could be improved.    Nutrition Goals Re-Evaluation:  Nutrition Goals Re-Evaluation     Row Name 06/08/23 1539 07/13/23 1442           Goals   Current Weight 303 lb 2.1 oz (137.5 kg) 301 lb 2.4 oz (136.6 kg)      Comment A1c 5.9, no recent lipid panel available for review A1c 5.9, no recent lipid panel available for review      Expected Outcome Goals in progress. Tinesha has medical history of CHF, HTN, lap band, OSA. She has started making many dietary changes including increased dietary fiber and decreased sodium intake. She is down 10# since starting with our program. Patient will benefit from participation in intensive cardiac rehab for nutrition, exercise, and lifestyle modification. Goals in progress. Annamae has medical history of CHF, HTN, lap band,  OSA. She has started making many dietary changes including increased dietary fiber, decreased sodium intake, and decreased simple sugars. She is down 12.8# since starting with our program. She continues to attend the [facility] education and nutrition series regularly. Patient will continue to benefit from participation in intensive cardiac rehab for nutrition, exercise, and lifestyle modification.               Nutrition Goals Re-Evaluation:  Nutrition Goals Re-Evaluation     Row Name 06/08/23 1539 07/13/23 1442           Goals   Current Weight 303 lb 2.1 oz (137.5 kg) 301 lb 2.4 oz (136.6 kg)      Comment A1c 5.9, no recent lipid panel available for review A1c 5.9, no recent lipid panel available for review      Expected Outcome Goals in progress. Shonia has medical history of CHF, HTN, lap band, OSA. She has started making many dietary changes including increased dietary fiber and decreased sodium intake. She is down 10# since starting with our program. Patient will benefit from participation in intensive cardiac rehab  for nutrition, exercise, and lifestyle modification. Goals in progress. Shadia has medical history of CHF, HTN, lap band, OSA. She has started making many dietary changes including increased dietary fiber, decreased sodium intake, and decreased simple sugars. She is down 12.8# since starting with our program. She continues to attend the [facility] education and nutrition series regularly. Patient will continue to benefit from participation in intensive cardiac rehab for nutrition, exercise, and lifestyle modification.               Nutrition Goals Discharge (Final Nutrition Goals Re-Evaluation):  Nutrition Goals Re-Evaluation - 07/13/23 1442       Goals   Current Weight 301 lb 2.4 oz (136.6 kg)    Comment A1c 5.9, no recent lipid panel available for review    Expected Outcome Goals in progress. Ryleeann has medical history of CHF, HTN, lap band, OSA. She has started making many dietary changes including increased dietary fiber, decreased sodium intake, and decreased simple sugars. She is down 12.8# since starting with our program. She continues to attend the [facility] education and nutrition series regularly. Patient will continue to benefit from participation in intensive cardiac rehab for nutrition, exercise, and lifestyle modification.             Psychosocial: Target Goals: Acknowledge presence or absence of significant depression and/or stress, maximize coping skills, provide positive support system. Participant is able to verbalize types and ability to use techniques and skills needed for reducing stress and depression.  Initial Review & Psychosocial Screening:  Initial Psych Review & Screening - 05/24/23 1512       Initial Review   Current issues with Current Depression;History of Depression;Current Anxiety/Panic;Current Psychotropic Meds;Current Sleep Concerns;Current Stress Concerns    Source of Stress Concerns Chronic Illness;Financial;Poor Coping Skills;Unable to participate in  former interests or hobbies;Unable to perform yard/household activities;Occupation    Comments Kimberla is currently depressed as she has been battling severe depression for the past several years. Tenya says the medications she is taking for anxiety and depression are being titrated. Madelline hopes that her anitdepressant will become more effective in managing her depression. Sheanna has a Paramedic and a psychiatirst that she currently sees. Christi previously had issues with obtaining medication due to no income. Housing insufficiency. Karole now has housing [employer].      Family Dynamics   Good Support System? Yes  Brocha says that she has a good support network of friends and family in the area.   Comments Aissatou would like to eventually change her therapy and counselling to a location closer to her home in [facility] if possible.      Barriers   Psychosocial barriers to participate in program The patient should benefit from training in stress management and relaxation.      Screening Interventions   Interventions To provide support and resources with identified psychosocial needs;Provide feedback about the scores to participant;Encouraged to exercise    Expected Outcomes Short Term goal: Utilizing psychosocial counselor, staff and physician to assist with identification of specific Stressors or current issues interfering with healing process. Setting desired goal for each stressor or current issue identified.;Long Term Goal: Stressors or current issues are controlled or eliminated.;Short Term goal: Identification and review with participant of any Quality of Life or Depression concerns found by scoring the questionnaire.;Long Term goal: The participant improves quality of Life and PHQ9 Scores as seen by post scores and/or verbalization of changes             Quality of Life Scores:  Quality of Life - 05/24/23 1439       Quality of Life   Select Quality of Life       Quality of Life Scores   Health/Function Pre 5.07 %    Socioeconomic Pre 6.63 %    Psych/Spiritual Pre 5.71 %    Family Pre 15.6 %    GLOBAL Pre 7.06 %            Scores of 19 and below usually indicate a poorer quality of life in these areas.  A difference of  2-3 points is a clinically meaningful difference.  A difference of 2-3 points in the total score of the Quality of Life Index has been associated with significant improvement in overall quality of life, self-image, physical symptoms, and general health in studies assessing change in quality of life.  PHQ-9: Review Flowsheet  More data exists      05/24/2023 04/22/2022 03/24/2022 02/25/2022 01/06/2022  Depression screen PHQ 2/9  Decreased Interest 3  1 2  0  Down, Depressed, Hopeless 3  2 3 1   PHQ - 2 Score 6  3 5 1   Altered sleeping 3  3 3 3   Tired, decreased energy 2  3 3 2   Change in appetite 2  1 3 1   Feeling bad or failure about yourself  1  3 0 0  Trouble concentrating 2  3 3 3   Moving slowly or fidgety/restless 0  0 2 0  Suicidal thoughts 0  0 0 0  PHQ-9 Score 16  16 19 10   Difficult doing work/chores Extremely dIfficult  Somewhat difficult Very difficult Extremely dIfficult    Details       Information is confidential and restricted. Go to Review Flowsheets to unlock data.        Interpretation of Total Score  Total Score Depression Severity:  1-4 = Minimal depression, 5-9 = Mild depression, 10-14 = Moderate depression, 15-19 = Moderately severe depression, 20-27 = Severe depression   Psychosocial Evaluation and Intervention:   Psychosocial Re-Evaluation:  Psychosocial Re-Evaluation     Row Name 06/23/23 1030 07/15/23 1725           Psychosocial Re-Evaluation   Current issues with History of Depression;Current Psychotropic Meds;Current Depression;Current Anxiety/Panic;Current Stress Concerns;Current Sleep Concerns History of Depression;Current Psychotropic Meds;Current Depression;Current  Anxiety/Panic;Current Stress Concerns;Current Sleep  Concerns      Comments Reviewed quality of life questionnaire.Areana admits to being very dissatisfied with her health and recently returned to exercise at cardiac rehab after being absent for several days.Cecelia says she always feels better when she attends cardiac rehab and has joined a Firefighter. Discussed maybe going with a friend for her first visit. Sejla recently changed her PCP to Abdul Hodgkin NP to be closer to her home. Cai is very pleased with her new provider. Richie says she is taking her medications as prescribed currently although she has had some relapses with medication non compliance. Celena said that Charles Connor NP gave her a 7 day medications pill dispenser and that has made it easier for her.   Dahna admits to having continued low energy and works on breathing techniques at home. Tamiya is not working currently. I gave Nisha a vocational rehab packet to review and fill out. Taja says she is looking a possibly doing some contract work. Kirandeep is currently staying with her mother.  Nelline is interested in receiving counseling will refer to [employer]. Lissa's PCP also forwarded quality of life review. Exercise is currently on hold due to a recent fall.      Expected Outcomes Manroop says particpating in cardiac rehab has been a stress reliever for her Jaquilla says particpating in cardiac rehab has been a stress reliever for her      Interventions Therapist referral;Stress management education;Relaxation education;Encouraged to attend Cardiac Rehabilitation for the exercise Therapist referral;Stress management education;Relaxation education;Encouraged to attend Cardiac Rehabilitation for the exercise      Continue Psychosocial Services  Follow up required by staff Follow up required by staff        Initial Review   Source of Stress Concerns Chronic Illness;Unable to perform yard/household activities;Unable to  participate in former interests or hobbies;Financial;Occupation;Poor Coping Skills Chronic Illness;Unable to perform yard/household activities;Unable to participate in former interests or hobbies;Financial;Occupation;Poor Coping Skills      Comments Will continiue to monitor and offer support as needed Will continiue to monitor and offer support as needed               Psychosocial Discharge (Final Psychosocial Re-Evaluation):  Psychosocial Re-Evaluation - 07/15/23 1725       Psychosocial Re-Evaluation   Current issues with History of Depression;Current Psychotropic Meds;Current Depression;Current Anxiety/Panic;Current Stress Concerns;Current Sleep Concerns    Comments Exercise is currently on hold due to a recent fall.    Expected Outcomes Magenta says particpating in cardiac rehab has been a stress reliever for her    Interventions Therapist referral;Stress management education;Relaxation education;Encouraged to attend Cardiac Rehabilitation for the exercise    Continue Psychosocial Services  Follow up required by staff      Initial Review   Source of Stress Concerns Chronic Illness;Unable to perform yard/household activities;Unable to participate in former interests or hobbies;Financial;Occupation;Poor Coping Skills    Comments Will continiue to monitor and offer support as needed             Vocational Rehabilitation: Provide vocational rehab assistance to qualifying candidates.   Vocational Rehab Evaluation & Intervention:  Vocational Rehab - 05/24/23 1526       Initial Vocational Rehab Evaluation & Intervention   Assessment shows need for Vocational Rehabilitation Yes    Vocational Rehab Packet given to patient 05/24/23      Vocational Rehab Re-Evaulation   Comments Maryl was given a vocational rehab packet to review and return on her next scheduled  exercise session. De             Education: Education Goals: Education classes will be provided on a weekly  basis, covering required topics. Participant will state understanding/return demonstration of topics presented.    Education     Row Name 05/30/23 1400     Education   Cardiac Education Topics Pritikin   Select Workshops     Workshops   Educator Exercise Physiologist   Select Psychosocial   Psychosocial Workshop Recognizing and Reducing Stress   Instruction Review Code 1- Verbalizes Understanding   Class Start Time 1358   Class Stop Time 1445   Class Time Calculation (min) 47 min    Row Name 06/01/23 1400     Education   Cardiac Education Topics Pritikin   Orthoptist   Educator Dietitian   Weekly Topic Delicious Desserts   Instruction Review Code 1- Verbalizes Understanding   Class Start Time 1355   Class Stop Time 1433   Class Time Calculation (min) 38 min    Row Name 06/03/23 1500     Education   Cardiac Education Topics Pritikin   Licensed conveyancer Nutrition   Nutrition Calorie Density   Instruction Review Code 1- Verbalizes Understanding   Class Start Time 1400   Class Stop Time 1435   Class Time Calculation (min) 35 min    Row Name 06/08/23 1400     Education   Cardiac Education Topics Pritikin   Customer service manager   Weekly Topic Efficiency Cooking - Meals in a Snap   Instruction Review Code 1- Verbalizes Understanding   Class Start Time 1345   Class Stop Time 1435   Class Time Calculation (min) 50 min    Row Name 06/22/23 1600     Education   Cardiac Education Topics Pritikin   Customer service manager   Weekly Topic Comforting Weekend Breakfasts   Instruction Review Code 1- Verbalizes Understanding   Class Start Time 1400   Class Stop Time 1440   Class Time Calculation (min) 40 min    Row Name 06/24/23 1500     Education   Cardiac Education Topics Pritikin   Engineering geologist Dietitian   Nutrition Dining Out - Part 1   Instruction Review Code 1- Verbalizes Understanding   Class Start Time 1400   Class Stop Time 1440   Class Time Calculation (min) 40 min    Row Name 07/01/23 1400     Education   Cardiac Education Topics Pritikin   Nurse, children's   Educator Dietitian   Nutrition Vitamins and Minerals   Instruction Review Code 1- Verbalizes Understanding   Class Start Time 1400   Class Stop Time 1440   Class Time Calculation (min) 40 min    Row Name 07/06/23 1500     Education   Cardiac Education Topics Pritikin   Customer service manager   Weekly Topic International Cuisine- Spotlight on the Blue Zones   Instruction Review Code 1- Verbalizes Understanding   Class Start Time 1400   Class Stop Time 1440   Class Time Calculation (min)  40 min            Core Videos: Exercise    Move It!  Clinical staff conducted group or individual video education with verbal and written material and guidebook.  Patient learns the recommended Pritikin exercise program. Exercise with the goal of living a long, healthy life. Some of the health benefits of exercise include controlled diabetes, healthier blood pressure levels, improved cholesterol levels, improved heart and lung capacity, improved sleep, and better body composition. Everyone should speak with their doctor before starting or changing an exercise routine.  Biomechanical Limitations Clinical staff conducted group or individual video education with verbal and written material and guidebook.  Patient learns how biomechanical limitations can impact exercise and how we can mitigate and possibly overcome limitations to have an impactful and balanced exercise routine.  Body Composition Clinical staff conducted group or individual video education with verbal and written material and guidebook.  Patient learns that body  composition (ratio of muscle mass to fat mass) is a key component to assessing overall fitness, rather than body weight alone. Increased fat mass, especially visceral belly fat, can put us  at increased risk for metabolic syndrome, type 2 diabetes, heart disease, and even death. It is recommended to combine diet and exercise (cardiovascular and resistance training) to improve your body composition. Seek guidance from your physician and exercise physiologist before implementing an exercise routine.  Exercise Action Plan Clinical staff conducted group or individual video education with verbal and written material and guidebook.  Patient learns the recommended strategies to achieve and enjoy long-term exercise adherence, including variety, self-motivation, self-efficacy, and positive decision making. Benefits of exercise include fitness, good health, weight management, more energy, better sleep, less stress, and overall well-being.  Medical   Heart Disease Risk Reduction Clinical staff conducted group or individual video education with verbal and written material and guidebook.  Patient learns our heart is our most vital organ as it circulates oxygen , nutrients, white blood cells, and hormones throughout the entire body, and carries waste away. Data supports a plant-based eating plan like the Pritikin Program for its effectiveness in slowing progression of and reversing heart disease. The video provides a number of recommendations to address heart disease.   Metabolic Syndrome and Belly Fat  Clinical staff conducted group or individual video education with verbal and written material and guidebook.  Patient learns what metabolic syndrome is, how it leads to heart disease, and how one can reverse it and keep it from coming back. You have metabolic syndrome if you have 3 of the following 5 criteria: abdominal obesity, high blood pressure, high triglycerides, low HDL cholesterol, and high blood  sugar.  Hypertension and Heart Disease Clinical staff conducted group or individual video education with verbal and written material and guidebook.  Patient learns that high blood pressure, or hypertension, is very common in the [place] . Hypertension is largely due to excessive salt intake, but other important risk factors include being overweight, physical inactivity, drinking too much alcohol, smoking, and not eating enough potassium from fruits and vegetables. High blood pressure is a leading risk factor for heart attack, stroke, congestive heart failure, dementia, kidney failure, and premature death. Long-term effects of excessive salt intake include stiffening of the arteries and thickening of heart muscle and organ damage. Recommendations include ways to reduce hypertension and the risk of heart disease.  Diseases of Our Time - Focusing on Diabetes Clinical staff conducted group or individual video education with verbal and written material and guidebook.  Patient learns why the best way to stop diseases of our time is prevention, through food and other lifestyle changes. Medicine (such as prescription pills and surgeries) is often only a Band-Aid on the problem, not a long-term solution. Most common diseases of our time include obesity, type 2 diabetes, hypertension, heart disease, and cancer. The Pritikin Program is recommended and has been proven to help reduce, reverse, and/or prevent the damaging effects of metabolic syndrome.  Nutrition   Overview of the Pritikin Eating Plan  Clinical staff conducted group or individual video education with verbal and written material and guidebook.  Patient learns about the Pritikin Eating Plan for disease risk reduction. The Pritikin Eating Plan emphasizes a wide variety of unrefined, minimally-processed carbohydrates, like fruits, vegetables, whole grains, and legumes. Go, Caution, and Stop food choices are explained. Plant-based and lean animal  proteins are emphasized. Rationale provided for low sodium intake for blood pressure control, low added sugars for blood sugar stabilization, and low added fats and oils for coronary artery disease risk reduction and weight management.  Calorie Density  Clinical staff conducted group or individual video education with verbal and written material and guidebook.  Patient learns about calorie density and how it impacts the Pritikin Eating Plan. Knowing the characteristics of the food you choose will help you decide whether those foods will lead to weight gain or weight loss, and whether you want to consume more or less of them. Weight loss is usually a side effect of the Pritikin Eating Plan because of its focus on low calorie-dense foods.  Label Reading  Clinical staff conducted group or individual video education with verbal and written material and guidebook.  Patient learns about the Pritikin recommended label reading guidelines and corresponding recommendations regarding calorie density, added sugars, sodium content, and whole grains.  Dining Out - Part 1  Clinical staff conducted group or individual video education with verbal and written material and guidebook.  Patient learns that restaurant meals can be sabotaging because they can be so high in calories, fat, sodium, and/or sugar. Patient learns recommended strategies on how to positively address this and avoid unhealthy pitfalls.  Facts on Fats  Clinical staff conducted group or individual video education with verbal and written material and guidebook.  Patient learns that lifestyle modifications can be just as effective, if not more so, as many medications for lowering your risk of heart disease. A Pritikin lifestyle can help to reduce your risk of inflammation and atherosclerosis (cholesterol build-up, or plaque, in the artery walls). Lifestyle interventions such as dietary choices and physical activity address the cause of atherosclerosis.  A review of the types of fats and their impact on blood cholesterol levels, along with dietary recommendations to reduce fat intake is also included.  Nutrition Action Plan  Clinical staff conducted group or individual video education with verbal and written material and guidebook.  Patient learns how to incorporate Pritikin recommendations into their lifestyle. Recommendations include planning and keeping personal health goals in mind as an important part of their success.  Healthy Mind-Set    Healthy Minds, Bodies, Hearts  Clinical staff conducted group or individual video education with verbal and written material and guidebook.  Patient learns how to identify when they are stressed. Video will discuss the impact of that stress, as well as the many benefits of stress management. Patient will also be introduced to stress management techniques. The way we think, act, and feel has an impact on our hearts.  How Our Thoughts  Can Heal Our Hearts  Clinical staff conducted group or individual video education with verbal and written material and guidebook.  Patient learns that negative thoughts can cause depression and anxiety. This can result in negative lifestyle behavior and serious health problems. Cognitive behavioral therapy is an effective method to help control our thoughts in order to change and improve our emotional outlook.  Additional Videos:  Exercise    Improving Performance  Clinical staff conducted group or individual video education with verbal and written material and guidebook.  Patient learns to use a non-linear approach by alternating intensity levels and lengths of time spent exercising to help burn more calories and lose more body fat. Cardiovascular exercise helps improve heart health, metabolism, hormonal balance, blood sugar control, and recovery from fatigue. Resistance training improves strength, endurance, balance, coordination, reaction time, metabolism, and muscle mass.  Flexibility exercise improves circulation, posture, and balance. Seek guidance from your physician and exercise physiologist before implementing an exercise routine and learn your capabilities and proper form for all exercise.  Introduction to Yoga  Clinical staff conducted group or individual video education with verbal and written material and guidebook.  Patient learns about yoga, a discipline of the coming together of mind, breath, and body. The benefits of yoga include improved flexibility, improved range of motion, better posture and core strength, increased lung function, weight loss, and positive self-image. Yoga's heart health benefits include lowered blood pressure, healthier heart rate, decreased cholesterol and triglyceride levels, improved immune function, and reduced stress. Seek guidance from your physician and exercise physiologist before implementing an exercise routine and learn your capabilities and proper form for all exercise.  Medical   Aging: Enhancing Your Quality of Life  Clinical staff conducted group or individual video education with verbal and written material and guidebook.  Patient learns key strategies and recommendations to stay in good physical health and enhance quality of life, such as prevention strategies, having an advocate, securing a Health Care Proxy and Power of Attorney, and keeping a list of medications and system for tracking them. It also discusses how to avoid risk for bone loss.  Biology of Weight Control  Clinical staff conducted group or individual video education with verbal and written material and guidebook.  Patient learns that weight gain occurs because we consume more calories than we burn (eating more, moving less). Even if your body weight is normal, you may have higher ratios of fat compared to muscle mass. Too much body fat puts you at increased risk for cardiovascular disease, heart attack, stroke, type 2 diabetes, and obesity-related  cancers. In addition to exercise, following the Pritikin Eating Plan can help reduce your risk.  Decoding Lab Results  Clinical staff conducted group or individual video education with verbal and written material and guidebook.  Patient learns that lab test reflects one measurement whose values change over time and are influenced by many factors, including medication, stress, sleep, exercise, food, hydration, pre-existing medical conditions, and more. It is recommended to use the knowledge from this video to become more involved with your lab results and evaluate your numbers to speak with your doctor.   Diseases of Our Time - Overview  Clinical staff conducted group or individual video education with verbal and written material and guidebook.  Patient learns that according to the CDC, 50% to 70% of chronic diseases (such as obesity, type 2 diabetes, elevated lipids, hypertension, and heart disease) are avoidable through lifestyle improvements including healthier food choices, listening to satiety cues, and  increased physical activity.  Sleep Disorders Clinical staff conducted group or individual video education with verbal and written material and guidebook.  Patient learns how good quality and duration of sleep are important to overall health and well-being. Patient also learns about sleep disorders and how they impact health along with recommendations to address them, including discussing with a physician.  Nutrition  Dining Out - Part 2 Clinical staff conducted group or individual video education with verbal and written material and guidebook.  Patient learns how to plan ahead and communicate in order to maximize their dining experience in a healthy and nutritious manner. Included are recommended food choices based on the type of restaurant the patient is visiting.   Fueling a Banker conducted group or individual video education with verbal and written material and  guidebook.  There is a strong connection between our food choices and our health. Diseases like obesity and type 2 diabetes are very prevalent and are in large-part due to lifestyle choices. The Pritikin Eating Plan provides plenty of food and hunger-curbing satisfaction. It is easy to follow, affordable, and helps reduce health risks.  Menu Workshop  Clinical staff conducted group or individual video education with verbal and written material and guidebook.  Patient learns that restaurant meals can sabotage health goals because they are often packed with calories, fat, sodium, and sugar. Recommendations include strategies to plan ahead and to communicate with the manager, chef, or server to help order a healthier meal.  Planning Your Eating Strategy  Clinical staff conducted group or individual video education with verbal and written material and guidebook.  Patient learns about the Pritikin Eating Plan and its benefit of reducing the risk of disease. The Pritikin Eating Plan does not focus on calories. Instead, it emphasizes high-quality, nutrient-rich foods. By knowing the characteristics of the foods, we choose, we can determine their calorie density and make informed decisions.  Targeting Your Nutrition Priorities  Clinical staff conducted group or individual video education with verbal and written material and guidebook.  Patient learns that lifestyle habits have a tremendous impact on disease risk and progression. This video provides eating and physical activity recommendations based on your personal health goals, such as reducing LDL cholesterol, losing weight, preventing or controlling type 2 diabetes, and reducing high blood pressure.  Vitamins and Minerals  Clinical staff conducted group or individual video education with verbal and written material and guidebook.  Patient learns different ways to obtain key vitamins and minerals, including through a recommended healthy diet. It is  important to discuss all supplements you take with your doctor.   Healthy Mind-Set    Smoking Cessation  Clinical staff conducted group or individual video education with verbal and written material and guidebook.  Patient learns that cigarette smoking and tobacco addiction pose a serious health risk which affects millions of people. Stopping smoking will significantly reduce the risk of heart disease, lung disease, and many forms of cancer. Recommended strategies for quitting are covered, including working with your doctor to develop a successful plan.  Culinary   Becoming a Set designer conducted group or individual video education with verbal and written material and guidebook.  Patient learns that cooking at home can be healthy, cost-effective, quick, and puts them in control. Keys to cooking healthy recipes will include looking at your recipe, assessing your equipment needs, planning ahead, making it simple, choosing cost-effective seasonal ingredients, and limiting the use of added fats, salts, and sugars.  Cooking - Breakfast and Snacks  Clinical staff conducted group or individual video education with verbal and written material and guidebook.  Patient learns how important breakfast is to satiety and nutrition through the entire day. Recommendations include key foods to eat during breakfast to help stabilize blood sugar levels and to prevent overeating at meals later in the day. Planning ahead is also a key component.  Cooking - Educational psychologist conducted group or individual video education with verbal and written material and guidebook.  Patient learns eating strategies to improve overall health, including an approach to cook more at home. Recommendations include thinking of animal protein as a side on your plate rather than center stage and focusing instead on lower calorie dense options like vegetables, fruits, whole grains, and plant-based proteins,  such as beans. Making sauces in large quantities to freeze for later and leaving the skin on your vegetables are also recommended to maximize your experience.  Cooking - Healthy Salads and Dressing Clinical staff conducted group or individual video education with verbal and written material and guidebook.  Patient learns that vegetables, fruits, whole grains, and legumes are the foundations of the Pritikin Eating Plan. Recommendations include how to incorporate each of these in flavorful and healthy salads, and how to create homemade salad dressings. Proper handling of ingredients is also covered. Cooking - Soups and [employer] - Soups and Desserts Clinical staff conducted group or individual video education with verbal and written material and guidebook.  Patient learns that Pritikin soups and desserts make for easy, nutritious, and delicious snacks and meal components that are low in sodium, fat, sugar, and calorie density, while high in vitamins, minerals, and filling fiber. Recommendations include simple and healthy ideas for soups and desserts.   Overview     The Pritikin Solution Program Overview Clinical staff conducted group or individual video education with verbal and written material and guidebook.  Patient learns that the results of the Pritikin Program have been documented in more than 100 articles published in peer-reviewed journals, and the benefits include reducing risk factors for (and, in some cases, even reversing) high cholesterol, high blood pressure, type 2 diabetes, obesity, and more! An overview of the three key pillars of the Pritikin Program will be covered: eating well, doing regular exercise, and having a healthy mind-set.  WORKSHOPS  Exercise: Exercise Basics: Building Your Action Plan Clinical staff led group instruction and group discussion with PowerPoint presentation and patient guidebook. To enhance the learning environment the use of posters, models and  videos may be added. At the conclusion of this workshop, patients will comprehend the difference between physical activity and exercise, as well as the benefits of incorporating both, into their routine. Patients will understand the FITT (Frequency, Intensity, Time, and Type) principle and how to use it to build an exercise action plan. In addition, safety concerns and other considerations for exercise and cardiac rehab will be addressed by the presenter. The purpose of this lesson is to promote a comprehensive and effective weekly exercise routine in order to improve patients' overall level of fitness.   Managing Heart Disease: Your Path to a Healthier Heart Clinical staff led group instruction and group discussion with PowerPoint presentation and patient guidebook. To enhance the learning environment the use of posters, models and videos may be added.At the conclusion of this workshop, patients will understand the anatomy and physiology of the heart. Additionally, they will understand how Pritikin's three pillars impact the risk factors,  the progression, and the management of heart disease.  The purpose of this lesson is to provide a high-level overview of the heart, heart disease, and how the Pritikin lifestyle positively impacts risk factors.  Exercise Biomechanics Clinical staff led group instruction and group discussion with PowerPoint presentation and patient guidebook. To enhance the learning environment the use of posters, models and videos may be added. Patients will learn how the structural parts of their bodies function and how these functions impact their daily activities, movement, and exercise. Patients will learn how to promote a neutral spine, learn how to manage pain, and identify ways to improve their physical movement in order to promote healthy living. The purpose of this lesson is to expose patients to common physical limitations that impact physical activity. Participants  will learn practical ways to adapt and manage aches and pains, and to minimize their effect on regular exercise. Patients will learn how to maintain good posture while sitting, walking, and lifting.  Balance Training and Fall Prevention  Clinical staff led group instruction and group discussion with PowerPoint presentation and patient guidebook. To enhance the learning environment the use of posters, models and videos may be added. At the conclusion of this workshop, patients will understand the importance of their sensorimotor skills (vision, proprioception, and the vestibular system) in maintaining their ability to balance as they age. Patients will apply a variety of balancing exercises that are appropriate for their current level of function. Patients will understand the common causes for poor balance, possible solutions to these problems, and ways to modify their physical environment in order to minimize their fall risk. The purpose of this lesson is to teach patients about the importance of maintaining balance as they age and ways to minimize their risk of falling.  WORKSHOPS   Nutrition:  Fueling a Ship broker led group instruction and group discussion with PowerPoint presentation and patient guidebook. To enhance the learning environment the use of posters, models and videos may be added. Patients will review the foundational principles of the Pritikin Eating Plan and understand what constitutes a serving size in each of the food groups. Patients will also learn Pritikin-friendly foods that are better choices when away from home and review make-ahead meal and snack options. Calorie density will be reviewed and applied to three nutrition priorities: weight maintenance, weight loss, and weight gain. The purpose of this lesson is to reinforce (in a group setting) the key concepts around what patients are recommended to eat and how to apply these guidelines when away from home by  planning and selecting Pritikin-friendly options. Patients will understand how calorie density may be adjusted for different weight management goals.  Mindful Eating  Clinical staff led group instruction and group discussion with PowerPoint presentation and patient guidebook. To enhance the learning environment the use of posters, models and videos may be added. Patients will briefly review the concepts of the Pritikin Eating Plan and the importance of low-calorie dense foods. The concept of mindful eating will be introduced as well as the importance of paying attention to internal hunger signals. Triggers for non-hunger eating and techniques for dealing with triggers will be explored. The purpose of this lesson is to provide patients with the opportunity to review the basic principles of the Pritikin Eating Plan, discuss the value of eating mindfully and how to measure internal cues of hunger and fullness using the Hunger Scale. Patients will also discuss reasons for non-hunger eating and learn strategies to use for controlling  emotional eating.  Targeting Your Nutrition Priorities Clinical staff led group instruction and group discussion with PowerPoint presentation and patient guidebook. To enhance the learning environment the use of posters, models and videos may be added. Patients will learn how to determine their genetic susceptibility to disease by reviewing their family history. Patients will gain insight into the importance of diet as part of an overall healthy lifestyle in mitigating the impact of genetics and other environmental insults. The purpose of this lesson is to provide patients with the opportunity to assess their personal nutrition priorities by looking at their family history, their own health history and current risk factors. Patients will also be able to discuss ways of prioritizing and modifying the Pritikin Eating Plan for their highest risk areas  Menu  Clinical staff led group  instruction and group discussion with PowerPoint presentation and patient guidebook. To enhance the learning environment the use of posters, models and videos may be added. Using menus brought in from [employer], or printed from [employer], patients will apply the Pritikin dining out guidelines that were presented in the [employer] video. Patients will also be able to practice these guidelines in a variety of provided scenarios. The purpose of this lesson is to provide patients with the opportunity to practice hands-on learning of the Pritikin Dining Out guidelines with actual menus and practice scenarios.  Label Reading Clinical staff led group instruction and group discussion with PowerPoint presentation and patient guidebook. To enhance the learning environment the use of posters, models and videos may be added. Patients will review and discuss the Pritikin label reading guidelines presented in Pritikin's Label Reading Educational series video. Using fool labels brought in from local grocery stores and markets, patients will apply the label reading guidelines and determine if the packaged food meet the Pritikin guidelines. The purpose of this lesson is to provide patients with the opportunity to review, discuss, and practice hands-on learning of the Pritikin Label Reading guidelines with actual packaged food labels. Cooking School  Pritikin's [employer] are designed to teach patients ways to prepare quick, simple, and affordable recipes at home. The importance of nutrition's role in chronic disease risk reduction is reflected in its emphasis in the overall Pritikin program. By learning how to prepare essential core Pritikin Eating Plan recipes, patients will increase control over what they eat; be able to customize the flavor of foods without the use of added salt, sugar, or fat; and improve the quality of the food they consume. By learning a set of core recipes  which are easily assembled, quickly prepared, and affordable, patients are more likely to prepare more healthy foods at home. These workshops focus on convenient breakfasts, simple entres, side dishes, and desserts which can be prepared with minimal effort and are consistent with nutrition recommendations for cardiovascular risk reduction. Cooking [employer] are taught by a Armed forces logistics/support/administrative officer (RD) who has been trained by the [employer]. The chef or RD has a clear understanding of the importance of minimizing - if not completely eliminating - added fat, sugar, and sodium in recipes. Throughout the series of Cooking School Workshop sessions, patients will learn about healthy ingredients and efficient methods of cooking to build confidence in their capability to prepare    Cooking School weekly topics:  Adding Flavor- Sodium-Free  Fast and Healthy Breakfasts  Powerhouse Plant-Based Proteins  Satisfying Salads and Dressings  Simple Sides and Sauces  International Cuisine-Spotlight on the Blue Zones  Delicious Desserts  Diplomatic Services operational officer - Meals in a Hydrologist and Snacks  Comforting Weekend Breakfasts  One-Pot Wonders   Fast [employer] Your Pritikin Plate  WORKSHOPS   Healthy Mindset (Psychosocial):  Focused Goals, Sustainable Changes Clinical staff led group instruction and group discussion with PowerPoint presentation and patient guidebook. To enhance the learning environment the use of posters, models and videos may be added. Patients will be able to apply effective goal setting strategies to establish at least one personal goal, and then take consistent, meaningful action toward that goal. They will learn to identify common barriers to achieving personal goals and develop strategies to overcome them. Patients will also gain an understanding of how our mind-set can impact our ability to achieve  goals and the importance of cultivating a positive and growth-oriented mind-set. The purpose of this lesson is to provide patients with a deeper understanding of how to set and achieve personal goals, as well as the tools and strategies needed to overcome common obstacles which may arise along the way.  From Head to Heart: The Power of a Healthy Outlook  Clinical staff led group instruction and group discussion with PowerPoint presentation and patient guidebook. To enhance the learning environment the use of posters, models and videos may be added. Patients will be able to recognize and describe the impact of emotions and mood on physical health. They will discover the importance of self-care and explore self-care practices which may work for them. Patients will also learn how to utilize the 4 C's to cultivate a healthier outlook and better manage stress and challenges. The purpose of this lesson is to demonstrate to patients how a healthy outlook is an essential part of maintaining good health, especially as they continue their cardiac rehab journey.  Healthy Sleep for a Healthy Heart Clinical staff led group instruction and group discussion with PowerPoint presentation and patient guidebook. To enhance the learning environment the use of posters, models and videos may be added. At the conclusion of this workshop, patients will be able to demonstrate knowledge of the importance of sleep to overall health, well-being, and quality of life. They will understand the symptoms of, and treatments for, common sleep disorders. Patients will also be able to identify daytime and nighttime behaviors which impact sleep, and they will be able to apply these tools to help manage sleep-related challenges. The purpose of this lesson is to provide patients with a general overview of sleep and outline the importance of quality sleep. Patients will learn about a few of the most common sleep disorders. Patients will also be  introduced to the concept of "sleep hygiene," and discover ways to self-manage certain sleeping problems through simple daily behavior changes. Finally, the workshop will motivate patients by clarifying the links between quality sleep and their goals of heart-healthy living.   Recognizing and Reducing Stress Clinical staff led group instruction and group discussion with PowerPoint presentation and patient guidebook. To enhance the learning environment the use of posters, models and videos may be added. At the conclusion of this workshop, patients will be able to understand the types of stress reactions, differentiate between acute and chronic stress, and recognize the impact that chronic stress has on their health. They will also be able to apply different coping mechanisms, such as reframing negative self-talk. Patients will have the opportunity to practice a variety of stress management techniques, such as deep abdominal breathing, progressive muscle relaxation, and/or  guided imagery.  The purpose of this lesson is to educate patients on the role of stress in their lives and to provide healthy techniques for coping with it.  Learning Barriers/Preferences:  Learning Barriers/Preferences - 05/24/23 1524       Learning Barriers/Preferences   Learning Barriers None;Exercise Concerns   Joscelyn reports having left hip pain with walking after walking any extended distance and shortness of breath with exertion   Learning Preferences Skilled Demonstration;Video;Pictoral             Education Topics:  Knowledge Questionnaire Score:  Knowledge Questionnaire Score - 05/24/23 1442       Knowledge Questionnaire Score   Pre Score 19/24             Core Components/Risk Factors/Patient Goals at Admission:  Personal Goals and Risk Factors at Admission - 05/24/23 1403       Core Components/Risk Factors/Patient Goals on Admission    Weight Management Yes;Obesity;Weight Loss    Intervention  Weight Management/Obesity: Establish reasonable short term and long term weight goals.;Obesity: Provide education and appropriate resources to help participant work on and attain dietary goals.    Admit Weight 313 lb 4.4 oz (142.1 kg)    Expected Outcomes Short Term: Continue to assess and modify interventions until short term weight is achieved;Long Term: Adherence to nutrition and physical activity/exercise program aimed toward attainment of established weight goal;Weight Loss: Understanding of general recommendations for a balanced deficit meal plan, which promotes 1-2 lb weight loss per week and includes a negative energy balance of [phone] kcal/d    Improve shortness of breath with ADL's Yes    Intervention Provide education, individualized exercise plan and daily activity instruction to help decrease symptoms of SOB with activities of daily living.    Expected Outcomes Short Term: Improve cardiorespiratory fitness to achieve a reduction of symptoms when performing ADLs;Long Term: Be able to perform more ADLs without symptoms or delay the onset of symptoms    Heart Failure Yes    Intervention Provide a combined exercise and nutrition program that is supplemented with education, support and counseling about heart failure. Directed toward relieving symptoms such as shortness of breath, decreased exercise tolerance, and extremity edema.    Expected Outcomes Improve functional capacity of life;Short term: Attendance in program 2-3 days a week with increased exercise capacity. Reported lower sodium intake. Reported increased fruit and vegetable intake. Reports medication compliance.;Short term: Daily weights obtained and reported for increase. Utilizing diuretic protocols set by physician.;Long term: Adoption of self-care skills and reduction of barriers for early signs and symptoms recognition and intervention leading to self-care maintenance.    Hypertension Yes    Intervention Provide education on  lifestyle modifcations including regular physical activity/exercise, weight management, moderate sodium restriction and increased consumption of fresh fruit, vegetables, and low fat dairy, alcohol moderation, and smoking cessation.;Monitor prescription use compliance.    Expected Outcomes Short Term: Continued assessment and intervention until BP is < 140/56mm HG in hypertensive participants. < 130/16mm HG in hypertensive participants with diabetes, heart failure or chronic kidney disease.;Long Term: Maintenance of blood pressure at goal levels.    Stress Yes    Intervention Offer individual and/or small group education and counseling on adjustment to heart disease, stress management and health-related lifestyle change. Teach and support self-help strategies.;Refer participants experiencing significant psychosocial distress to appropriate mental health specialists for further evaluation and treatment. When possible, include family members and significant others in education/counseling sessions.    Expected Outcomes Short  Term: Participant demonstrates changes in health-related behavior, relaxation and other stress management skills, ability to obtain effective social support, and compliance with psychotropic medications if prescribed.;Long Term: Emotional wellbeing is indicated by absence of clinically significant psychosocial distress or social isolation.             Core Components/Risk Factors/Patient Goals Review:   Goals and Risk Factor Review     Row Name 05/30/23 1716 06/23/23 1034 07/15/23 1731         Core Components/Risk Factors/Patient Goals Review   Personal Goals Review Weight Management/Obesity;Improve shortness of breath with ADL's;Hypertension;Heart Failure;Stress Weight Management/Obesity;Lipids;Stress;Heart Failure;Hypertension Weight Management/Obesity;Lipids;Stress;Heart Failure;Hypertension     Review Wandra started cardiac rehab on 05/30/23. Deirdre did well with exercise.  Vital signs were stable. Brooke just returned to exercise after being absent for several days. Sharri has lost 5 kg since starting cardiac rehab. Vital signs have been stable. Leeanna is enjoying group exercise. Egan has lost .5  kg since starting cardiac rehab. Vital signs have been stable. Kolbee is enjoying group exercise. Dianey's exercise is currently on hold due to a recent fall     Expected Outcomes Chenille will continue to participate in cardiac rehab for exercise, nutrition and lifestyle modifications Abimbola will continue to participate in cardiac rehab for exercise, nutrition and lifestyle modifications Ettie will continue to participate in cardiac rehab for exercise, nutrition and lifestyle modifications              Core Components/Risk Factors/Patient Goals at Discharge (Final Review):   Goals and Risk Factor Review - 07/15/23 1731       Core Components/Risk Factors/Patient Goals Review   Personal Goals Review Weight Management/Obesity;Lipids;Stress;Heart Failure;Hypertension    Review Sorcha has lost .5  kg since starting cardiac rehab. Vital signs have been stable. Brieanne is enjoying group exercise. Nadie's exercise is currently on hold due to a recent fall    Expected Outcomes Phoenyx will continue to participate in cardiac rehab for exercise, nutrition and lifestyle modifications             ITP Comments:  ITP Comments     Row Name 05/24/23 1334 05/30/23 1713 06/24/23 0950 07/15/23 1724     ITP Comments Medical Director- Dr. Gaylyn Keas, MD. Introduction to the Cardiac Rehab Program. Initial orientation folder reviewed. 30 Day ITP Review. Maysel started cardiac rehab on 05/30/23. Erin did well with exercise. 30 Day ITP Review. Genee returned to exercise at cardiac rehab on 06/22/23 after being absent for several days. 30 Day ITP Review. Mathea is currently absent due to a recent fall. Exercise is currently on hold             Comments: See ITP comments.Monte Antonio RN BSN

## 2023-07-15 NOTE — Telephone Encounter (Signed)
 Left message to call cardiac rehab. Will cancel Emiya's exercise appointments until appointment with PCP Honora Lutes NP. Harmonii will need clearance to return to exercise.Monte Antonio RN BSN

## 2023-07-18 ENCOUNTER — Ambulatory Visit (HOSPITAL_COMMUNITY): Payer: Medicaid Other

## 2023-07-18 ENCOUNTER — Encounter (HOSPITAL_COMMUNITY): Payer: Medicaid Other

## 2023-07-20 ENCOUNTER — Ambulatory Visit (HOSPITAL_COMMUNITY): Payer: Medicaid Other

## 2023-07-20 ENCOUNTER — Encounter (HOSPITAL_COMMUNITY): Payer: Medicaid Other

## 2023-07-21 ENCOUNTER — Ambulatory Visit: Admitting: Family

## 2023-07-22 ENCOUNTER — Encounter (HOSPITAL_COMMUNITY): Admission: RE | Admit: 2023-07-22 | Payer: Medicaid Other | Source: Ambulatory Visit

## 2023-07-22 ENCOUNTER — Telehealth (HOSPITAL_COMMUNITY): Payer: Self-pay | Admitting: *Deleted

## 2023-07-22 ENCOUNTER — Ambulatory Visit (HOSPITAL_COMMUNITY): Payer: Medicaid Other

## 2023-07-22 NOTE — Telephone Encounter (Signed)
 Left message to call cardiac rehab.Thayer Headings RN BSN

## 2023-07-25 ENCOUNTER — Encounter (HOSPITAL_COMMUNITY): Admission: RE | Admit: 2023-07-25 | Payer: Medicaid Other | Source: Ambulatory Visit

## 2023-07-25 ENCOUNTER — Ambulatory Visit (HOSPITAL_COMMUNITY): Payer: Medicaid Other

## 2023-07-27 ENCOUNTER — Encounter (HOSPITAL_COMMUNITY): Admission: RE | Admit: 2023-07-27 | Payer: Medicaid Other | Source: Ambulatory Visit

## 2023-07-27 ENCOUNTER — Ambulatory Visit (HOSPITAL_COMMUNITY): Payer: Medicaid Other

## 2023-07-28 ENCOUNTER — Telehealth (HOSPITAL_BASED_OUTPATIENT_CLINIC_OR_DEPARTMENT_OTHER): Payer: Self-pay

## 2023-07-28 ENCOUNTER — Telehealth (HOSPITAL_COMMUNITY): Payer: Self-pay | Admitting: *Deleted

## 2023-07-28 NOTE — Telephone Encounter (Signed)
 Left message to call cardiac rehab.Thayer Headings RN BSN

## 2023-07-29 ENCOUNTER — Ambulatory Visit: Admitting: Family

## 2023-07-29 ENCOUNTER — Encounter (HOSPITAL_COMMUNITY): Payer: Medicaid Other

## 2023-07-29 ENCOUNTER — Emergency Department (HOSPITAL_BASED_OUTPATIENT_CLINIC_OR_DEPARTMENT_OTHER): Admission: EM | Admit: 2023-07-29 | Discharge: 2023-07-29 | Disposition: A | Discharge status: Home / Self Care

## 2023-07-29 ENCOUNTER — Encounter: Payer: Self-pay | Admitting: Family

## 2023-07-29 ENCOUNTER — Other Ambulatory Visit: Payer: Self-pay

## 2023-07-29 ENCOUNTER — Emergency Department (HOSPITAL_BASED_OUTPATIENT_CLINIC_OR_DEPARTMENT_OTHER)

## 2023-07-29 ENCOUNTER — Ambulatory Visit (HOSPITAL_COMMUNITY): Payer: Medicaid Other

## 2023-07-29 ENCOUNTER — Encounter (HOSPITAL_BASED_OUTPATIENT_CLINIC_OR_DEPARTMENT_OTHER): Payer: Self-pay | Admitting: Emergency Medicine

## 2023-07-29 VITALS — BP 136/86 | HR 113 | Ht 62.0 in | Wt 309.8 lb

## 2023-07-29 DIAGNOSIS — Z79899 Other long term (current) drug therapy: Secondary | ICD-10-CM | POA: Diagnosis not present

## 2023-07-29 DIAGNOSIS — S0990XA Unspecified injury of head, initial encounter: Secondary | ICD-10-CM

## 2023-07-29 DIAGNOSIS — Z7951 Long term (current) use of inhaled steroids: Secondary | ICD-10-CM | POA: Diagnosis not present

## 2023-07-29 DIAGNOSIS — I110 Hypertensive heart disease with heart failure: Secondary | ICD-10-CM | POA: Insufficient documentation

## 2023-07-29 DIAGNOSIS — R519 Headache, unspecified: Secondary | ICD-10-CM | POA: Insufficient documentation

## 2023-07-29 DIAGNOSIS — Z87891 Personal history of nicotine dependence: Secondary | ICD-10-CM | POA: Diagnosis not present

## 2023-07-29 DIAGNOSIS — I5022 Chronic systolic (congestive) heart failure: Secondary | ICD-10-CM | POA: Insufficient documentation

## 2023-07-29 DIAGNOSIS — I509 Heart failure, unspecified: Secondary | ICD-10-CM | POA: Diagnosis not present

## 2023-07-29 DIAGNOSIS — J45909 Unspecified asthma, uncomplicated: Secondary | ICD-10-CM | POA: Diagnosis not present

## 2023-07-29 DIAGNOSIS — G4452 New daily persistent headache (NDPH): Secondary | ICD-10-CM

## 2023-07-29 DIAGNOSIS — S0990XD Unspecified injury of head, subsequent encounter: Secondary | ICD-10-CM | POA: Diagnosis not present

## 2023-07-29 DIAGNOSIS — W19XXXA Unspecified fall, initial encounter: Secondary | ICD-10-CM | POA: Insufficient documentation

## 2023-07-29 LAB — PREGNANCY, URINE: Preg Test, Ur: NEGATIVE

## 2023-07-29 NOTE — Progress Notes (Signed)
 Samantha Romero is a 48 y.o. female with the following history as recorded in [employer]:  Patient Active Problem List   Diagnosis Date Noted   HFrEF (heart failure with reduced ejection fraction) (HCC) 12/29/2022   OSA on CPAP 12/29/2022   Morbid obesity (HCC) 12/29/2022   Mixed anxiety and depressive disorder 07/12/2022   Left hip pain 09/30/2020   Environmental and seasonal allergies 07/24/2019   Asthma 10/06/2018   Hypertensive urgency 10/06/2018   GERD (gastroesophageal reflux disease) 10/06/2018   Depression with anxiety 10/06/2018   CHF (congestive heart failure), NYHA class II, acute, systolic (HCC) 10/06/2018   Shortness of breath    Cardiomegaly    Snoring 10/04/2018   Essential hypertension 08/14/2018   Moderate persistent asthma with acute exacerbation 08/14/2018   Fibromyalgia 11/18/2016   Class 3 severe obesity due to excess calories without serious comorbidity with body mass index (BMI) of 45.0 to 49.9 in adult 11/18/2016   Adjustment reaction with anxiety and depression 11/18/2016   Chronic bilateral low back pain without sciatica 09/22/2015   Myofascial pain 09/22/2015   Lapband port leaking-REVISED May 2013 09/10/2011   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 150 mL 1   albuterol  (VENTOLIN  HFA) 108 (90 Base) MCG/ACT inhaler Inhale 2 puffs into the lungs every 6 (six) hours as needed for wheezing or shortness of breath. 8 g 2   ALPRAZolam  (XANAX ) 1 MG tablet Take 1 tablet (1 mg total) by mouth 2 (two) times daily as needed for anxiety. 60 tablet 2   azelastine  (ASTELIN ) 0.1 % nasal spray Place 2 sprays into both nostrils 2 (two) times daily. 90 mL 3   bacitracin  ointment Apply 1 Application topically 2 (two) times daily. 120 g 0   buPROPion   (WELLBUTRIN  XL) 150 MG 24 hr tablet TAKE 3 TABLETS BY MOUTH DAILY. 270 tablet 0   carvedilol  (COREG ) 25 MG tablet TAKE 1 TABLET (25 MG TOTAL) BY MOUTH TWICE A DAY WITH MEALS 180 tablet 3   DULoxetine  (CYMBALTA ) 30 MG capsule Take 1 capsule (30 mg total) by mouth daily. 30 capsule 5   DULoxetine  (CYMBALTA ) 60 MG capsule Take 1 capsule (60 mg total) by mouth daily. 30 capsule 5   empagliflozin  (JARDIANCE ) 10 MG TABS tablet TAKE ONE TABLET BY MOUTH DAILY BEFORE BREAKFAST 90 tablet 3   Fluticasone -Salmeterol 232-14 MCG/ACT AEPB Inhale 1 puff into the lungs in the morning and at bedtime. 1 each 5   furosemide  (LASIX ) 40 MG tablet Take 1 tablet (40 mg total) by mouth daily. You may take 1 extra tablet (40 mg total) by mouth daily as needed for lower extremity swelling/weight gain. 105 tablet 2   levocetirizine (XYZAL ) 5 MG tablet Take 1 tablet (5 mg total) by mouth every evening. 90 tablet 3   levonorgestrel (MIRENA, 52 MG,) 20 MCG/DAY IUD      lisdexamfetamine (VYVANSE ) 50 MG capsule Take 1 capsule (50 mg total) by mouth daily. 30 capsule 0   lisdexamfetamine (VYVANSE ) 50 MG capsule Take 1 capsule (50 mg total) by mouth daily. 30 capsule 0   montelukast  (SINGULAIR ) 10 MG tablet TAKE 1 TABLET BY MOUTH EVERYDAY AT BEDTIME 90 tablet 3   omeprazole  (PRILOSEC) 40 MG capsule TAKE 1 CAPSULE BY MOUTH EVERY DAY 90 capsule  3   sacubitril -valsartan  (ENTRESTO ) 97-103 MG Take 1 tablet by mouth 2 (two) times daily. 60 tablet 11   spironolactone  (ALDACTONE ) 25 MG tablet Take 1 tablet (25 mg total) by mouth daily. 90 tablet 3   traZODone  (DESYREL ) 50 MG tablet Take 1 tablet (50 mg total) by mouth at bedtime. 30 tablet 5   cephALEXin  (KEFLEX ) 500 MG capsule Take 1 capsule (500 mg total) by mouth 3 (three) times daily. (Patient not taking: Reported on 07/29/2023) 21 capsule 0   No current facility-administered medications for this visit.    Allergies: Grass extracts [gramineae pollens] and Shellfish allergy  Past  Medical History:  Diagnosis Date   Anxiety    panic attacks   Asthma    CHF (congestive heart failure), NYHA class II, acute, systolic (HCC) 10/06/2018   Depression    Fibromyalgia    Hypertension    no meds currently   No pertinent past medical history    Pneumonia    hx of    Sinus problem     Past Surgical History:  Procedure Laterality Date   ESOPHAGOGASTRODUODENOSCOPY N/A 11/26/2014   Procedure: ESOPHAGOGASTRODUODENOSCOPY (EGD);  Surgeon: Juanita Norlander, MD;  Location: [facility];  Service: General;  Laterality: N/A;   GASTRIC BANDING PORT REVISION  08/24/11   LAPAROSCOPIC GASTRIC BANDING  12/26/2007   LAPAROSCOPIC LYSIS OF ADHESIONS N/A 01/06/2015   Procedure: LAPAROSCOPIC LYSIS OF ADHESIONS;  Surgeon: Jacolyn Matar, MD;  Location: [facility];  Service: General;  Laterality: N/A;   LAPAROSCOPIC REPAIR AND REMOVAL OF GASTRIC BAND N/A 01/06/2015   Procedure: LAPAROSCOPIC RESIGHTING OF GASTRIC Band PORT;  Surgeon: Jacolyn Matar, MD;  Location: [facility];  Service: General;  Laterality: N/A;   NO PAST SURGERIES      Family History  Problem Relation Age of Onset   Ulcers Mother    Hypertension Mother    Cancer Maternal Uncle        lung   Cancer Maternal Grandfather        lung and back    Social History   Tobacco Use   Smoking status: Never   Smokeless tobacco: Never  Substance Use Topics   Alcohol use: Never    Subjective:   Patient tripped while walking down stairs approximately 3 weeks ago- she did actually hit her head and then landed on her left side; has had persisting throbbing headache since that time; did have head CT ordered at [facility] when she was seen here but patient notes that she did not feel that she needed to get the test done. However, patient notes that she has had persisting throbbing painful sensation over her right eye/ front forehead area where she did hit her head; does not have a history of migraine headaches;     Objective:  Vitals:   07/29/23 1532   BP: 136/86  Pulse: (!) 113  SpO2: 95%  Weight: (!) 309 lb 12.8 oz (140.5 kg)  Height: 5\' 2"  (1.575 m)    General: Well developed, well nourished, in no acute distress  Skin : Warm and dry.  Head: Normocephalic and atraumatic  Eyes: Sclera and conjunctiva clear; pupils round and reactive to light; extraocular movements intact  Ears: External normal; canals clear; tympanic membranes normal  Oropharynx: Pink, supple. No suspicious lesions  Neck: Supple without thyromegaly, adenopathy  Lungs: Respirations unlabored;  Neurologic: Alert and oriented; speech intact; face symmetrical; moves all extremities well; CNII-XII intact without focal deficit   Assessment:  1. Traumatic injury  of head, subsequent encounter   2. New daily persistent headache     Plan:  Due to persisting symptoms and no prior history of headaches with known head trauma, do feel that emergent imaging is warranted at this time; discussed that concussion is possibility but also need to consider brain injury or even damage to the skull; patient is seen on a Friday afternoon with 3 week history of these symptoms- am concerned to have her wait longer until test can be scheduled next week; she is taking directly to the ER for further evaluation.   No follow-ups on file.  No orders of the defined types were placed in this encounter.   Requested Prescriptions    No prescriptions requested or ordered in this encounter

## 2023-07-29 NOTE — Discharge Instructions (Addendum)
 As discussed, CT of your head appeared normal.  No obvious brain bleed, skull fracture or other abnormality.  Recommend continued follow-up with your primary care for assessment of your symptoms.  Please not hesitate to return if the worrisome signs and symptoms we discussed become apparent.

## 2023-07-29 NOTE — Patient Instructions (Signed)
 Due to the nature of the head injury and persisting pain, I agree with the recommendation yesterday that we need to get your head and skull looked at as quickly as possible. I am not able to get all this imaging set up for you today. Please head downstairs and let them you recently had a fall/ hit your head and have persisting symptoms for the past 3 weeks.

## 2023-07-29 NOTE — ED Provider Notes (Signed)
 [place] [facility] Provider Note   CSN: [id number] Arrival date & time: 07/29/23  1603     History  Chief Complaint  Patient presents with   Headache    Samantha Romero is a 48 y.o. female.   Headache   48 year old female presents emergency department with complaints of intermittent headache.  Patient states that she had a mechanical fall on 07/07/23.  States that she was walking out of a coffee shop and the steps are uneven causing her to fall.  States she fell down several steps and managed hitting her head in the process.  She was forward obtaining scrapes on both of her legs which had improved with time as well as wound care at home.  Reports continued intermittent right-sided headache where she hit her head.  Describes headache above her right eye.  Does report blurry vision in both of her eyes that occurs typically at at night when she was looking at her computer.  States it only last for about 30 seconds or so and is only happened 3-4 times over the past 2+ weeks.  Was seen at primary care today and told to come to the emergency department for more emergent imaging.  Denies any current symptoms.  Denies any headache, blurry vision, weakness/numbness in arms or legs, gait abnormality, slurred speech, facial droop.  Denies any pain elsewhere.  Past medical history significant for asthma, hypertension, fibromyalgia, CHF, obesity, cardiomegaly, chronic bilateral low back pain, OSA on CPAP  Home Medications Prior to Admission medications   Medication Sig Start Date End Date Taking? Authorizing Provider  albuterol  (PROVENTIL ) (2.5 MG/3ML) 0.083% nebulizer solution Take 3 mLs (2.5 mg total) by nebulization every 6 (six) hours as needed for wheezing or shortness of breath. 03/24/22   Susanna Epley, FNP  albuterol  (VENTOLIN  HFA) 108 (90 Base) MCG/ACT inhaler Inhale 2 puffs into the lungs every 6 (six) hours as needed for wheezing or shortness of breath.  03/24/22   Moore, Janece, FNP  ALPRAZolam  (XANAX ) 1 MG tablet Take 1 tablet (1 mg total) by mouth 2 (two) times daily as needed for anxiety. 06/07/23   Mozingo, Regina Nattalie, NP  azelastine  (ASTELIN ) 0.1 % nasal spray Place 2 sprays into both nostrils 2 (two) times daily. 06/17/23   Adra Alanis, FNP  bacitracin  ointment Apply 1 Application topically 2 (two) times daily. 07/08/23   Adolph Hoop, PA-C  buPROPion  (WELLBUTRIN  XL) 150 MG 24 hr tablet TAKE 3 TABLETS BY MOUTH DAILY. 06/15/23   Mozingo, Regina Nattalie, NP  carvedilol  (COREG ) 25 MG tablet TAKE 1 TABLET (25 MG TOTAL) BY MOUTH TWICE A DAY WITH MEALS 04/28/23   Hugh Madura, MD  cephALEXin  (KEFLEX ) 500 MG capsule Take 1 capsule (500 mg total) by mouth 3 (three) times daily. Patient not taking: Reported on 07/29/2023 07/08/23   Adolph Hoop, PA-C  DULoxetine  (CYMBALTA ) 30 MG capsule Take 1 capsule (30 mg total) by mouth daily. 06/07/23   Mozingo, Regina Nattalie, NP  DULoxetine  (CYMBALTA ) 60 MG capsule Take 1 capsule (60 mg total) by mouth daily. 06/07/23   Mozingo, Regina Nattalie, NP  empagliflozin  (JARDIANCE ) 10 MG TABS tablet TAKE ONE TABLET BY MOUTH DAILY BEFORE BREAKFAST 04/28/23   Hugh Madura, MD  Fluticasone -Salmeterol 232-14 MCG/ACT AEPB Inhale 1 puff into the lungs in the morning and at bedtime. 06/17/23   Adra Alanis, FNP  furosemide  (LASIX ) 40 MG tablet Take 1 tablet (40 mg total) by mouth daily. You  may take 1 extra tablet (40 mg total) by mouth daily as needed for lower extremity swelling/weight gain. 04/28/23   Hugh Madura, MD  levocetirizine (XYZAL ) 5 MG tablet Take 1 tablet (5 mg total) by mouth every evening. 06/17/23   Adra Alanis, FNP  levonorgestrel (MIRENA, 52 MG,) 20 MCG/DAY IUD  03/11/16   [provider]  lisdexamfetamine (VYVANSE ) 50 MG capsule Take 1 capsule (50 mg total) by mouth daily. 06/07/23   Mozingo, Regina Nattalie, NP  lisdexamfetamine (VYVANSE ) 50 MG capsule Take 1 capsule  (50 mg total) by mouth daily. 07/05/23   Mozingo, Regina Nattalie, NP  montelukast  (SINGULAIR ) 10 MG tablet TAKE 1 TABLET BY MOUTH EVERYDAY AT BEDTIME 06/17/23   Adra Alanis, FNP  omeprazole  (PRILOSEC) 40 MG capsule TAKE 1 CAPSULE BY MOUTH EVERY DAY 06/17/23   Murray, Laura Woodruff, FNP  sacubitril -valsartan  (ENTRESTO ) 97-103 MG Take 1 tablet by mouth 2 (two) times daily. 04/28/23   Hugh Madura, MD  spironolactone  (ALDACTONE ) 25 MG tablet Take 1 tablet (25 mg total) by mouth daily. 04/28/23   Hugh Madura, MD  traZODone  (DESYREL ) 50 MG tablet Take 1 tablet (50 mg total) by mouth at bedtime. 06/07/23   Mozingo, Regina Nattalie, NP  mometasone -formoterol  (DULERA) 200-5 MCG/ACT AERO Inhale 2 puffs into the lungs 2 (two) times a day. 10/19/18 12/17/19  Brian Campanile, MD      Allergies    Grass extracts [gramineae pollens] and Shellfish allergy    Review of Systems   Review of Systems  Neurological:  Positive for headaches.  All other systems reviewed and are negative.   Physical Exam Updated Vital Signs LMP 06/28/2023 (Approximate)  Physical Exam Vitals and nursing note reviewed.  Constitutional:      General: She is not in acute distress.    Appearance: She is well-developed.  HENT:     Head: Normocephalic and atraumatic.     Comments: No tenderness palpation right forehead area. Eyes:     Conjunctiva/sclera: Conjunctivae normal.  Cardiovascular:     Rate and Rhythm: Normal rate and regular rhythm.     Heart sounds: No murmur heard. Pulmonary:     Effort: Pulmonary effort is normal. No respiratory distress.     Breath sounds: Normal breath sounds.  Abdominal:     Palpations: Abdomen is soft.     Tenderness: There is no abdominal tenderness.  Musculoskeletal:        General: No swelling.     Cervical back: Normal range of motion and neck supple. No rigidity or tenderness.  Skin:    General: Skin is warm and dry.     Capillary Refill: Capillary refill takes  less than 2 seconds.  Neurological:     Mental Status: She is alert.     Comments: Alert and oriented to self, place, time and event.   Speech is fluent, clear without dysarthria or dysphasia.   Strength 5/5 in upper/lower extremities   Sensation intact in upper/lower extremities   Normal gait.  CN I not tested  CN II grossly intact visual fields bilaterally. Did not visualize posterior eye.  CN III, IV, VI PERRLA and EOMs intact bilaterally  CN V Intact sensation to sharp and light touch to the face  CN VII facial movements symmetric  CN VIII not tested  CN IX, X no uvula deviation, symmetric rise of soft palate  CN XI 5/5 SCM and trapezius strength bilaterally  CN XII Midline tongue protrusion, symmetric  L/R movements     Psychiatric:        Mood and Affect: Mood normal.     ED Results / Procedures / Treatments   Labs (all labs ordered are listed, but only abnormal results are displayed) Labs Reviewed - No data to display  EKG None  Radiology No results found.  Procedures Procedures    Medications Ordered in ED Medications - No data to display  ED Course/ Medical Decision Making/ A&P                                 Medical Decision Making Amount and/or Complexity of Data Reviewed Labs: ordered. Radiology: ordered.   This patient presents to the ED for concern of fall, this involves an extensive number of treatment options, and is a complaint that carries with it a high risk of complications and morbidity.  The differential diagnosis includes CVA, fracture, strain/pain, dislocation, ligamentous/tendinous injury, neurovasc compromise, pneumothorax, solid organ damage, other   Co morbidities that complicate the patient evaluation  See HPI   Additional history obtained:  Additional history obtained from EMR External records from outside source obtained and reviewed including hospital records   Lab Tests:  Urine pregnancy negative  Imaging Studies  ordered:  I ordered imaging studies including CT head I independently visualized and interpreted imaging which showed   CT head: No acute intracranial abnormality I agree with the radiologist interpretation   Cardiac Monitoring: / EKG:  The patient was maintained on a cardiac monitor.  I personally viewed and interpreted the cardiac monitored which showed an underlying rhythm of: sinus rhythm   Consultations Obtained:  N/a   Problem List / ED Course / Critical interventions / Medication management  Fall, head injury Reevaluation of the patient showed that the patient stayed the same I have reviewed the patients home medicines and have made adjustments as needed   Social Determinants of Health:  Former cigarette use.  Denies illicit drug use.   Test / Admission - Considered:  Fall,  Vitals signs within normal range and stable throughout visit. Imaging studies significant for: see above 48 year old female presents emergency department with complaints of intermittent headache.  Patient states that she had a mechanical fall on 07/07/23.  States that she was walking out of a coffee shop and the steps are uneven causing her to fall.  States she fell down several steps and managed hitting her head in the process.  She was forward obtaining scrapes on both of her legs which had improved with time as well as wound care at home.  Reports continued intermittent right-sided headache where she hit her head.  Describes headache above her right eye.  Does report blurry vision in both of her eyes that occurs typically at at night when she was looking at her computer.  States it only last for about 30 seconds or so and is only happened 3-4 times over the past 2+ weeks.  Was seen at primary care today and told to come to the emergency department for more emergent imaging.  Denies any current symptoms.  Denies any headache, blurry vision, weakness/numbness in arms or legs, gait abnormality, slurred  speech, facial droop.  Denies any pain elsewhere. On exam, no palpable tenderness right forehead.  Normal visual acuity.  Nonfocal neuroexam.  CT imaging was obtained given patient's intermittent/persistent symptoms despite fall being on April 10.  CT imaging negative for any acute  intracranial process.  Patient reassured by findings.  Currently asymptomatic throughout duration of ED visit.  Will recommend follow-up with PCP for reevaluation/continued monitoring of symptoms.  Treatment plan discussed with patient and she acknowledged understanding was agreeable to said plan.  Patient will well-appearing, afebrile in no acute distress. Worrisome signs and symptoms were discussed with the patient, and the patient acknowledged understanding to return to the ED if noticed. Patient was stable upon discharge.         Final Clinical Impression(s) / ED Diagnoses Final diagnoses:  None    Rx / DC Orders ED Discharge Orders     None         Hatch Butter, [place] 07/29/23 1743    Rolinda Climes, DO 07/29/23 2324

## 2023-07-29 NOTE — ED Triage Notes (Addendum)
 Pt POV reports fall down brick steps and hit head on concrete 2 weeks ago. Reports ongoing headache. Reports blurred vision associated with headache.   Took 1/2 xanax  appx 1 hr PTA.

## 2023-08-01 ENCOUNTER — Ambulatory Visit (HOSPITAL_COMMUNITY): Payer: Medicaid Other

## 2023-08-01 ENCOUNTER — Encounter (HOSPITAL_COMMUNITY): Payer: Medicaid Other

## 2023-08-02 ENCOUNTER — Telehealth (HOSPITAL_COMMUNITY): Payer: Self-pay | Admitting: *Deleted

## 2023-08-02 NOTE — Telephone Encounter (Signed)
 Left message to call cardiac rehab. Will discharge on 08/03/23 due to non attendance no return call.Monte Antonio RN BSN

## 2023-08-03 ENCOUNTER — Encounter (HOSPITAL_COMMUNITY)
Admission: RE | Admit: 2023-08-03 | Discharge: 2023-08-03 | Disposition: A | Discharge status: Home / Self Care | Payer: Medicaid Other | Source: Ambulatory Visit | Attending: Cardiology | Admitting: Cardiology

## 2023-08-03 ENCOUNTER — Encounter (HOSPITAL_COMMUNITY): Payer: Self-pay | Admitting: *Deleted

## 2023-08-03 ENCOUNTER — Ambulatory Visit (HOSPITAL_COMMUNITY): Payer: Medicaid Other

## 2023-08-03 DIAGNOSIS — I5022 Chronic systolic (congestive) heart failure: Secondary | ICD-10-CM

## 2023-08-03 NOTE — Progress Notes (Signed)
 Discharge Progress Report  Patient Details  Name: Samantha Romero MRN: [id number] Date of Birth: Sep 19, 1975 Referring Provider:   Flowsheet Row CARDIAC REHAB PHASE II ORIENTATION from 05/24/2023 in [facility]  Referring Provider Hugh Madura, MD        Number of Visits: 20  Reason for Discharge:  Patient reached a stable level of exercise. Rhylei did not return to cardiac rehab after a mechanical fall  Smoking History:  Social History   Tobacco Use  Smoking Status Never  Smokeless Tobacco Never    Diagnosis:  Heart failure, chronic systolic (HCC)  ADL UCSD:   Initial Exercise Prescription:   Discharge Exercise Prescription (Final Exercise Prescription Changes):  Exercise Prescription Changes - 06/24/23 1628       Response to Exercise   Blood Pressure (Admit) 118/62    Blood Pressure (Exercise) 122/74    Blood Pressure (Exit) 104/60    Heart Rate (Admit) 93 bpm    Heart Rate (Exercise) 128 bpm    Heart Rate (Exit) 103 bpm    Rating of Perceived Exertion (Exercise) 11    Perceived Dyspnea (Exercise) 0    Symptoms 0    Comments Reviewed MET's, goals and home ExRx    Duration Progress to 30 minutes of  aerobic without signs/symptoms of physical distress    Intensity THRR unchanged      Progression   Progression Continue to progress workloads to maintain intensity without signs/symptoms of physical distress.    Average METs 2.8      Resistance Training   Training Prescription Yes    Weight 2 lbs    Reps 10-15    Time 10 Minutes      T5 Nustep   Level 3    SPM 111    Minutes 30    METs 2.8   05/30/23 ERROR on MET's. True MET's were 2.0, not 10            Functional Capacity:   Psychological, QOL, Others - Outcomes: PHQ 2/9:    05/24/2023    2:36 PM 04/22/2022    9:39 AM 03/24/2022    2:53 PM 02/25/2022   10:37 AM 01/06/2022   12:06 PM  Depression screen PHQ 2/9  Decreased Interest  3  1 2  0  Down, Depressed, Hopeless 3  2 3 1   PHQ - 2 Score 6  3 5 1   Altered sleeping 3  3 3 3   Tired, decreased energy 2  3 3 2   Change in appetite 2  1 3 1   Feeling bad or failure about yourself  1  3 0 0  Trouble concentrating 2  3 3 3   Moving slowly or fidgety/restless 0  0 2 0  Suicidal thoughts 0  0 0 0  PHQ-9 Score 16  16 19 10   Difficult doing work/chores Extremely dIfficult  Somewhat difficult Very difficult Extremely dIfficult     Information is confidential and restricted. Go to Review Flowsheets to unlock data.    Quality of Life:   Personal Goals: Goals established at orientation with interventions provided to work toward goal.    Personal Goals Discharge:  Goals and Risk Factor Review     Row Name 06/23/23 1034 07/15/23 1731           Core Components/Risk Factors/Patient Goals Review   Personal Goals Review Weight Management/Obesity;Lipids;Stress;Heart Failure;Hypertension Weight Management/Obesity;Lipids;Stress;Heart Failure;Hypertension      Review Jency just  returned to exercise after being absent for several days. Ashanti has lost 5 kg since starting cardiac rehab. Vital signs have been stable. Johnnie is enjoying group exercise. Sharyah has lost .5  kg since starting cardiac rehab. Vital signs have been stable. Punam is enjoying group exercise. Ardelle's exercise is currently on hold due to a recent fall      Expected Outcomes Trisa will continue to participate in cardiac rehab for exercise, nutrition and lifestyle modifications Allana will continue to participate in cardiac rehab for exercise, nutrition and lifestyle modifications               Exercise Goals and Review:   Exercise Goals Re-Evaluation:  Exercise Goals Re-Evaluation     Row Name 06/24/23 1634             Exercise Goal Re-Evaluation   Exercise Goals Review Increase Physical Activity;Understanding of Exercise Prescription;Increase Strength and Stamina;Knowledge and understanding  of Target Heart Rate Range (THRR);Able to understand and use rate of perceived exertion (RPE) scale       Comments Reviewed MET's, goals and home ExRx. Pt tolerated exercise well with an average MET level of 2.8. She is increasing WL's and MET's. She feels good about her goals and is increasing sttrength and stamina, she is also feeling in better control of her SOB. She is going to add in 2 days of exercise for about 30-45 mins by trying walking, [employer], dance Fit, Better Me app, and strength training. Reviewed gradual progression with weight lifting.       Expected Outcomes Will continue to monitor pt and progress workloads as tolerated without sign or symptom                Nutrition & Weight - Outcomes:    Nutrition:  Nutrition Therapy & Goals - 08/04/23 1532       Nutrition Therapy   Diet Heart Healthy Diet      Personal Nutrition Goals   Nutrition Goal Patient to identify strategies for reducing cardiovascular risk by attending the [employer] education and nutrition series weekly.    Personal Goal #2 Patient to improve diet quality by using the plate method as a guide for meal planning to include lean protein/plant protein, fruits, vegetables, whole grains, nonfat dairy as part of a well-balanced diet.    Personal Goal #3 Patient to reduce sodium intake to 1500mg  per day.    Personal Goal #4 Patient to identify strategies for weight loss of 0.5-2.0# per week.    Comments Diedre will be discharged due to poor attendance. Prior to her absences, goals were progress. Laquesha has medical history of CHF, HTN, lap band, OSA. She had started making many dietary changes including increased dietary fiber, decreased sodium intake, and decreased simple sugars. She was down 12.1# since starting with our program. Patient will continue to benefit from adherence to heart healthy nutrition, exercise, and lifestyle modification.      Intervention Plan   Intervention Prescribe, educate and  counsel regarding individualized specific dietary modifications aiming towards targeted core components such as weight, hypertension, lipid management, diabetes, heart failure and other comorbidities.;Nutrition handout(s) given to patient.    Expected Outcomes Short Term Goal: Understand basic principles of dietary content, such as calories, fat, sodium, cholesterol and nutrients.;Long Term Goal: Adherence to prescribed nutrition plan.             Nutrition Discharge:   Education Questionnaire Score:   Nikie attended 20 exercise sessions between 05/24/23- 07/06/23.  Darrien did well with exercise and lost 5.5 kg while enrolled in the program. Catharine enjoyed the group exercise and the educational classes. Avalon did not return to exercise after a mechanical fall. We are proud of Aadhira's progress while enrolled in the program.Kian Gamarra Jomarie Neer RN BSN

## 2023-08-05 ENCOUNTER — Encounter (HOSPITAL_COMMUNITY): Payer: Medicaid Other

## 2023-08-05 ENCOUNTER — Ambulatory Visit (HOSPITAL_COMMUNITY): Payer: Medicaid Other

## 2023-08-08 ENCOUNTER — Encounter (HOSPITAL_COMMUNITY): Payer: Medicaid Other

## 2023-08-08 ENCOUNTER — Ambulatory Visit (HOSPITAL_COMMUNITY): Payer: Medicaid Other

## 2023-08-10 ENCOUNTER — Ambulatory Visit (HOSPITAL_COMMUNITY): Payer: Medicaid Other

## 2023-08-10 ENCOUNTER — Encounter (HOSPITAL_COMMUNITY): Payer: Medicaid Other

## 2023-08-12 ENCOUNTER — Encounter (HOSPITAL_COMMUNITY): Payer: Medicaid Other

## 2023-08-15 ENCOUNTER — Encounter (HOSPITAL_COMMUNITY): Payer: Medicaid Other

## 2023-08-17 ENCOUNTER — Encounter (HOSPITAL_COMMUNITY): Payer: Medicaid Other

## 2023-08-18 ENCOUNTER — Encounter: Payer: Self-pay | Admitting: Adult Health

## 2023-08-18 ENCOUNTER — Telehealth (INDEPENDENT_AMBULATORY_CARE_PROVIDER_SITE_OTHER): Admitting: Adult Health

## 2023-08-18 DIAGNOSIS — F332 Major depressive disorder, recurrent severe without psychotic features: Secondary | ICD-10-CM

## 2023-08-18 DIAGNOSIS — F329 Major depressive disorder, single episode, unspecified: Secondary | ICD-10-CM

## 2023-08-18 DIAGNOSIS — G4700 Insomnia, unspecified: Secondary | ICD-10-CM | POA: Diagnosis not present

## 2023-08-18 DIAGNOSIS — F909 Attention-deficit hyperactivity disorder, unspecified type: Secondary | ICD-10-CM

## 2023-08-18 DIAGNOSIS — F411 Generalized anxiety disorder: Secondary | ICD-10-CM | POA: Diagnosis not present
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
## 2023-08-19 ENCOUNTER — Ambulatory Visit (HOSPITAL_COMMUNITY)

## 2023-08-24 ENCOUNTER — Ambulatory Visit (HOSPITAL_COMMUNITY)

## 2023-08-26 ENCOUNTER — Telehealth (HOSPITAL_BASED_OUTPATIENT_CLINIC_OR_DEPARTMENT_OTHER): Payer: Self-pay

## 2023-09-06 ENCOUNTER — Ambulatory Visit (HOSPITAL_COMMUNITY)
Admission: RE | Admit: 2023-09-06 | Discharge: 2023-09-06 | Disposition: A | Discharge status: Home / Self Care | Source: Ambulatory Visit | Attending: Cardiovascular Disease | Admitting: Cardiovascular Disease

## 2023-09-06 DIAGNOSIS — I351 Nonrheumatic aortic (valve) insufficiency: Secondary | ICD-10-CM

## 2023-09-06 DIAGNOSIS — I10 Essential (primary) hypertension: Secondary | ICD-10-CM | POA: Insufficient documentation

## 2023-09-06 DIAGNOSIS — I5022 Chronic systolic (congestive) heart failure: Secondary | ICD-10-CM | POA: Diagnosis present

## 2023-09-06 DIAGNOSIS — G4733 Obstructive sleep apnea (adult) (pediatric): Secondary | ICD-10-CM | POA: Diagnosis present

## 2023-09-06 DIAGNOSIS — I428 Other cardiomyopathies: Secondary | ICD-10-CM | POA: Insufficient documentation

## 2023-09-06 LAB — ECHOCARDIOGRAM COMPLETE
Area-P 1/2: 5.17 cm2
S' Lateral: 2.6 cm

## 2023-09-07 ENCOUNTER — Ambulatory Visit: Payer: Self-pay | Admitting: Nurse Practitioner

## 2023-09-15 ENCOUNTER — Other Ambulatory Visit: Payer: Self-pay | Admitting: Adult Health

## 2023-09-19 NOTE — Progress Notes (Signed)
 Cardiology Office Note    Patient Name: Samantha Romero Date of Encounter: 09/20/2023  Primary Care Provider:  Jason Leita Repine, FNP Primary Cardiologist:  None Primary Electrophysiologist: None   Past Medical History    Past Medical History:  Diagnosis Date   Anxiety    panic attacks   Asthma    CHF (congestive heart failure), NYHA class II, acute, systolic (HCC) 10/06/2018   Depression    Fibromyalgia    Hypertension    no meds currently   No pertinent past medical history    Pneumonia    hx of    Sinus problem     History of Present Illness  Samantha Romero is a 48 y.o. female with a PMH of HFimpEF, NICM, HLD, HTN, GERD, anxiety, OSA (on CPAP), asthma, obesity s/p bariatric surgery 2009 who presents today for 67-month follow-up.   Chismar was last seen on 06/16/2023 for 60-month follow-up.  She was stable with no CHF exacerbations or symptoms during visit.  She was noted to have elevated BP with advisement to monitor over the next 2 weeks.  She also reported some inconsistencies with spironolactone .  He was provided a pillbox and encouraged to develop better consistency with medications.  An updated 2D echo that showed stable EF of 60 to 65% with no LVH and no significant change from prior echo.  She was advised to continue current medications as prescribed.  She was also referred to clinical pharmacy to discuss weight loss medications.   [name] presents today for follow-up.  She has been experiencing an elevated heart rate, which she suspects may be related to her Vyvanse  medication. There has been no recent dose adjustment, and she took it around 10 AM. She is unable to monitor her heart rate currently as she lost her Apple watch but is in the process of obtaining a new one. No new cardiac symptoms have been noted since her last visit. She recently underwent an echocardiogram and has been using a pill box to improve medication organization, reporting significant improvement in  her adherence. Her current medications include spironolactone , Entresto , and carvedilol . She reports that her home blood pressure monitor was inaccurate, and she plans to purchase a new one. She is experiencing a severe toothache and believes it may be affecting her heart rate. She plans to purchase Advil for pain management and has been using Tylenol  as well. She suffered a major fall on April 10th, resulting in torn knees and a head injury. Recovery was estimated to take six to eight weeks. She is still experiencing burning sensations in one of the larger wounds on her knee, which she treats with Neosporin. She has not resumed cardiac rehab due to the fall but is eager to return once fully cleared.and was noted to be tachycardic on arrival.  She denied chest pain, palpitations, dyspnea, PND, orthopnea, nausea, vomiting, dizziness, syncope, edema, weight gain, or early satiety.  Discussed the use of AI scribe software for clinical note transcription with the patient, who gave verbal consent to proceed.  History of Present Illness   Review of Systems  Please see the history of present illness.    All other systems reviewed and are otherwise negative except as noted above.  Physical Exam    Wt Readings from Last 3 Encounters:  09/20/23 (!) 301 lb 12.8 oz (136.9 kg)  07/29/23 (!) 309 lb (140.2 kg)  07/29/23 (!) 309 lb 12.8 oz (140.5 kg)   VS: Vitals:  09/20/23 1146  BP: (!) 124/98  Pulse: (!) 104  SpO2: 94%  ,Body mass index is 55.2 kg/m. GEN: Well nourished, well developed in no acute distress Neck: No JVD; No carotid bruits Pulmonary: Clear to auscultation without rales, wheezing or rhonchi  Cardiovascular: Normal rate. Regular rhythm. Normal S1. Normal S2.   Murmurs: There is no murmur.  ABDOMEN: Soft, non-tender, non-distended EXTREMITIES:  No edema; No deformity   EKG/LABS/ Recent Cardiac Studies   ECG personally reviewed by me today -sinus rhythm with rate of 99 bpm and right  axis deviation with QT corrected at 490 ms  Risk Assessment/Calculations:          Lab Results  Component Value Date   WBC 6.7 04/14/2022   HGB 12.8 04/14/2022   HCT 40.3 04/14/2022   MCV 87 04/14/2022   PLT 272 04/14/2022   Lab Results  Component Value Date   CREATININE 0.72 04/14/2022   BUN 9 04/14/2022   NA 145 (H) 04/14/2022   K 3.5 04/14/2022   CL 106 04/14/2022   CO2 24 04/14/2022   Lab Results  Component Value Date   CHOL 158 11/19/2019   HDL 50 11/19/2019   LDLCALC 91 11/19/2019   TRIG 93 11/19/2019   CHOLHDL 3.2 11/19/2019    Lab Results  Component Value Date   HGBA1C 5.9 (H) 01/06/2022   Assessment & Plan    Assessment & Plan  1.HFrEF/NICM:  Heart failure secondary to non-ischemic cardiomyopathy with repeat 2D echo completed showing normalized ejection fraction (60-65%) due to medical therapy. Low risk for progressive heart failure. -Continue current GDMT with Jardiance  10 mg, carvedilol  25 mg twice daily Lasix  40 mg, Entresto  97/103 mg twice daily and spironolactone  25 mg daily -Low sodium diet, fluid restriction <2L, and daily weights encouraged. Educated to contact our office for weight gain of 2 lbs overnight or 5 lbs in one week.   2.  Essential hypertension: - Patient's blood pressure was initially elevated at 124/98 and was 128/88 on recheck -She reports current toothache which may be attributing to elevated BP. - Patient will monitor blood pressures at home for 1 week and report readings.  3.  History of OSA: - Patient reports compliance with CPAP  4.  Morbid obesity: - Patient's BMI is 55.20 kg/m - Refer to Pharm.D. to discuss GLP-1's to assist in weight loss.  5. Anxiety and Stress: Ongoing stress and anxiety exacerbated by work. Inconsistent adherence to Wellbutrin  450 mg. Seeking therapist or psychiatrist. - Encourage finding a therapist or psychiatrist for cognitive behavioral therapy. - Discuss the importance of consistent medication  adherence for anxiety management.   Disposition: Follow-up with None or APP in 12 months   Signed, Wyn Raddle, Jackee Shove, NP 09/20/2023, 12:50 PM [facility]

## 2023-09-20 ENCOUNTER — Other Ambulatory Visit (HOSPITAL_COMMUNITY): Payer: Self-pay

## 2023-09-20 ENCOUNTER — Ambulatory Visit: Attending: Nurse Practitioner | Admitting: Nurse Practitioner

## 2023-09-20 ENCOUNTER — Encounter: Payer: Self-pay | Admitting: Nurse Practitioner

## 2023-09-20 VITALS — BP 124/98 | HR 104 | Ht 62.0 in | Wt 301.8 lb

## 2023-09-20 DIAGNOSIS — I5032 Chronic diastolic (congestive) heart failure: Secondary | ICD-10-CM | POA: Diagnosis not present

## 2023-09-20 DIAGNOSIS — G4733 Obstructive sleep apnea (adult) (pediatric): Secondary | ICD-10-CM | POA: Insufficient documentation

## 2023-09-20 DIAGNOSIS — I10 Essential (primary) hypertension: Secondary | ICD-10-CM | POA: Diagnosis not present

## 2023-09-20 DIAGNOSIS — I5022 Chronic systolic (congestive) heart failure: Secondary | ICD-10-CM

## 2023-09-20 DIAGNOSIS — F418 Other specified anxiety disorders: Secondary | ICD-10-CM | POA: Diagnosis present

## 2023-09-20 MED ORDER — FUROSEMIDE 40 MG PO TABS
40.0000 mg | ORAL_TABLET | Freq: Every day | ORAL | 2 refills | Status: AC
Start: 2023-09-20 — End: ?
  Filled 2023-09-20: qty 105, 90d supply, fill #0
  Filled 2024-01-30: qty 105, 90d supply, fill #1

## 2023-09-20 MED ORDER — SPIRONOLACTONE 25 MG PO TABS
25.0000 mg | ORAL_TABLET | Freq: Every day | ORAL | 3 refills | Status: AC
  Filled 2023-09-20: qty 90, 90d supply, fill #0
  Filled 2024-01-30: qty 90, 90d supply, fill #1

## 2023-09-20 MED ORDER — EMPAGLIFLOZIN 10 MG PO TABS
10.0000 mg | ORAL_TABLET | Freq: Every morning | ORAL | 3 refills | Status: AC
Start: 2023-09-20 — End: ?
  Filled 2023-09-20: qty 90, 90d supply, fill #0
  Filled 2024-01-30: qty 90, 90d supply, fill #1

## 2023-09-20 MED ORDER — ENTRESTO 97-103 MG PO TABS
1.0000 | ORAL_TABLET | Freq: Two times a day (BID) | ORAL | 3 refills | Status: AC
  Filled 2023-09-20: qty 180, 90d supply, fill #0
  Filled 2024-01-30: qty 180, 90d supply, fill #1

## 2023-09-20 MED ORDER — OMRON 3 SERIES BP MONITOR DEVI
1.0000 | Freq: Every day | 0 refills | Status: AC
  Filled 2023-09-20: qty 1, 30d supply, fill #0

## 2023-09-20 NOTE — Patient Instructions (Addendum)
 Medication Instructions:  Your physician recommends that you continue on your current medications as directed. Please refer to the Current Medication list given to you today. *If you need a refill on your cardiac medications before your next appointment, please call your pharmacy*  Lab Work: NONE ORDERED If you have labs (blood work) drawn today and your tests are completely normal, you will receive your results only by: MyChart Message (if you have MyChart) OR A paper copy in the mail If you have any lab test that is abnormal or we need to change your treatment, we will call you to review the results.  Testing/Procedures: NONE ORDERED  Follow-Up: At [facility], you and your health needs are our priority.  As part of our continuing mission to provide you with exceptional heart care, our providers are all part of one team.  This team includes your primary Cardiologist (physician) and Advanced Practice Providers or APPs (Physician Assistants and Nurse Practitioners) who all work together to provide you with the care you need, when you need it.  Your next appointment:   12 month(s)  Provider:   Oneil Parchment, MD    We recommend signing up for the patient portal called MyChart.  Sign up information is provided on this After Visit Summary.  MyChart is used to connect with patients for Virtual Visits (Telemedicine).  Patients are able to view lab/test results, encounter notes, upcoming appointments, etc.  Non-urgent messages can be sent to your provider as well.   To learn more about what you can do with MyChart, go to [email or web address].   Other Instructions REFERRAL PLACED TO SPEAK WITH IN OFFICE PHARMACIST  Check your blood pressure daily for 1 week, then contact the office with your readings.  Contact the office either by phone or MyChart with your readings.  Make sure to check your blood pressure 2 hours after taking your medications.   AVOID these things for 30  minutes before checking your blood pressure: No Drinking caffeine. No Drinking alcohol. No Eating. No Smoking. No Exercising.  Five minutes before checking your blood pressure: Pee. Sit in a dining chair. Avoid sitting in a soft couch or armchair. Be quiet. Do not talk.

## 2023-10-20 ENCOUNTER — Encounter: Payer: Self-pay | Admitting: Pharmacist Clinician (PhC)/ Clinical Pharmacy Specialist

## 2023-10-20 ENCOUNTER — Telehealth: Payer: Self-pay | Admitting: Pharmacist Clinician (PhC)/ Clinical Pharmacy Specialist

## 2023-10-20 ENCOUNTER — Ambulatory Visit: Attending: Cardiology | Admitting: Pharmacist Clinician (PhC)/ Clinical Pharmacy Specialist

## 2023-10-20 NOTE — Telephone Encounter (Signed)
 Please do PA for Zepbound (obesity/OSA) or Wegovy (obesity)

## 2023-10-20 NOTE — Assessment & Plan Note (Signed)
  Patient has not met goal of at least 5% of body weight loss with comprehensive lifestyle modifications alone in the past 3-6 months. Pharmacotherapy is appropriate to pursue as augmentation. Will start Zepbound, as patient has diagnosis of OSA, on CPAP.   Confirmed patient not pregnant and no personal or family history of medullary thyroid  carcinoma (MTC) or Multiple Endocrine Neoplasia syndrome type 2 (MEN 2).   Advised patient on common side effects including nausea, diarrhea, dyspepsia, decreased appetite, and fatigue. Counseled patient on reducing meal size and how to titrate medication to minimize side effects. Patient aware to call if intolerable side effects or if experiencing dehydration, abdominal pain, or dizziness. Patient will adhere to dietary modifications and will target at least 150 minutes of moderate intensity exercise weekly, plus resistance training twice a week (as recommended by the American Heart Association). This resistance training--such as weightlifting, bodyweight exercises, or using resistance bands, adapted to the patient's ability--will help prevent muscle loss.  Injection technique reviewed at today's visit.    Follow up in 1-2 days regarding coverage of Zepbound . If therapy is initiated, phone or MyChart follow-ups will be conducted every 4 weeks for dose titration until the patient reaches the effective therapeutic dose and target weight.

## 2023-10-20 NOTE — Progress Notes (Signed)
 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    History of Present Illness    Samantha Romero is a 48 y.o. female patient of Dr Jeffrie, in the office today to discuss options for weight management.    Current weight management medications: none  Current meds that may affect weight: none  Baseline weight/BMI: 138.8 kg // 55.95  Insurance payor:  Medicaid [employer]  Diet: more at home than eating out.  Struggles with trying to fast or graze, then gets too hungry and overeats; likes egg whites for breakfast , snacking on cherries/fruit, crackers, fruit gummies  Exercise: none currently (fall w/ concussion recently); does have gym membership  Family History: father died (pt 76), mother smoker, no heart issues; 1 brother,has struggled at times   Confirmed patient not pregnant and no personal or family history of medullary thyroid  carcinoma (MTC) or Multiple Endocrine Neoplasia syndrome type 2 (MEN 2).   Social History:   Tobacco: no  Alcohol: no  Accessory Clinical Findings    Lab Results  Component Value Date   CREATININE 0.72 04/14/2022   BUN 9 04/14/2022   NA 145 (H) 04/14/2022   K 3.5 04/14/2022   CL 106 04/14/2022   CO2 24 04/14/2022   Lab Results  Component Value Date   ALT 62 (H) 04/14/2022   AST 32 04/14/2022   ALKPHOS 135 (H) 04/14/2022   BILITOT 0.8 04/14/2022   Lab Results  Component Value Date   HGBA1C 5.9 (H) 01/06/2022      Home  Medications/Allergies    Current Outpatient Medications  Medication Sig Dispense Refill   albuterol  (PROVENTIL ) (2.5 MG/3ML) 0.083% nebulizer solution Take 3 mLs (2.5 mg total) by nebulization every 6 (six) hours as needed for wheezing or shortness of breath. 150 mL 1   albuterol  (VENTOLIN  HFA) 108 (90 Base) MCG/ACT inhaler Inhale 2 puffs into the lungs every 6 (six) hours as needed for wheezing or shortness of breath. 8 g 2   ALPRAZolam  (XANAX ) 1 MG tablet Take 1 tablet (1 mg total) by mouth 2 (two) times daily as needed for anxiety. 60 tablet 2   azelastine  (ASTELIN ) 0.1 % nasal spray Place 2 sprays into both nostrils 2 (two) times daily. 90 mL 3   bacitracin  ointment Apply 1 Application topically 2 (two) times daily. 120 g 0   Blood Pressure Monitoring (OMRON 3 SERIES BP MONITOR) DEVI Use daily as directed. 1 each 0   buPROPion  (WELLBUTRIN  XL) 150 MG 24 hr tablet TAKE 3 TABLETS BY MOUTH DAILY 270 tablet 0   carvedilol  (COREG ) 25 MG tablet TAKE 1 TABLET (25 MG TOTAL) BY MOUTH TWICE A DAY WITH MEALS 180 tablet 3   cephALEXin  (KEFLEX ) 500 MG capsule Take 1 capsule (500 mg total) by mouth 3 (three) times daily. (Patient not taking: Reported on 07/29/2023) 21 capsule 0   DULoxetine  (CYMBALTA ) 30 MG capsule Take 1 capsule (30  mg total) by mouth daily. 30 capsule 5   DULoxetine  (CYMBALTA ) 60 MG capsule Take 1 capsule (60 mg total) by mouth daily. 30 capsule 5   empagliflozin  (JARDIANCE ) 10 MG TABS tablet Take 1 tablet (10 mg total) by mouth every morning before breakfast. 90 tablet 3   Fluticasone -Salmeterol 232-14 MCG/ACT AEPB Inhale 1 puff into the lungs in the morning and at bedtime. 1 each 5   furosemide  (LASIX ) 40 MG tablet Take 1 tablet (40 mg total) by mouth daily. You may take 1 extra tablet (40 mg total) by mouth daily as needed for lower extremity swelling/weight gain. 105 tablet 2   levocetirizine (XYZAL ) 5 MG tablet Take 1 tablet (5 mg total) by mouth every evening. 90 tablet 3    levonorgestrel (MIRENA, 52 MG,) 20 MCG/DAY IUD      lisdexamfetamine (VYVANSE ) 50 MG capsule Take 1 capsule (50 mg total) by mouth daily. 30 capsule 0   lisdexamfetamine (VYVANSE ) 50 MG capsule Take 1 capsule (50 mg total) by mouth daily. (Patient not taking: Reported on 09/20/2023) 30 capsule 0   montelukast  (SINGULAIR ) 10 MG tablet TAKE 1 TABLET BY MOUTH EVERYDAY AT BEDTIME 90 tablet 3   omeprazole  (PRILOSEC) 40 MG capsule TAKE 1 CAPSULE BY MOUTH EVERY DAY 90 capsule 3   sacubitril -valsartan  (ENTRESTO ) 97-103 MG Take 1 tablet by mouth 2 (two) times daily. 180 tablet 3   spironolactone  (ALDACTONE ) 25 MG tablet Take 1 tablet (25 mg total) by mouth daily. 90 tablet 3   traZODone  (DESYREL ) 50 MG tablet Take 1 tablet (50 mg total) by mouth at bedtime. 30 tablet 5   No current facility-administered medications for this visit.     Allergies  Allergen Reactions   Grass Extracts [Gramineae Pollens]     Nasal congestion, post nasal drip    Shellfish Allergy Itching    In throat only.    Assessment & Plan    Morbid obesity (HCC)  Patient has not met goal of at least 5% of body weight loss with comprehensive lifestyle modifications alone in the past 3-6 months. Pharmacotherapy is appropriate to pursue as augmentation. Will start Zepbound, as patient has diagnosis of OSA, on CPAP.   Confirmed patient not pregnant and no personal or family history of medullary thyroid  carcinoma (MTC) or Multiple Endocrine Neoplasia syndrome type 2 (MEN 2).   Advised patient on common side effects including nausea, diarrhea, dyspepsia, decreased appetite, and fatigue. Counseled patient on reducing meal size and how to titrate medication to minimize side effects. Patient aware to call if intolerable side effects or if experiencing dehydration, abdominal pain, or dizziness. Patient will adhere to dietary modifications and will target at least 150 minutes of moderate intensity exercise weekly, plus resistance training  twice a week (as recommended by the American Heart Association). This resistance training--such as weightlifting, bodyweight exercises, or using resistance bands, adapted to the patient's ability--will help prevent muscle loss.  Injection technique reviewed at today's visit.    Follow up in 1-2 days regarding coverage of Zepbound . If therapy is initiated, phone or MyChart follow-ups will be conducted every 4 weeks for dose titration until the patient reaches the effective therapeutic dose and target weight.   Catlyn Shipton PharmD CPP [facility]  [address] [place], [place] [address] [phone]

## 2023-10-20 NOTE — Patient Instructions (Signed)
 We will start the prior authorization process to get Zepbound covered by your insurance.   TIPS FOR SUCCESS Write down the reasons why you want to lose weight and post it in a place where you'll see it often. Start small and work your way up. Keep in mind that it takes time to achieve goals, and small steps add up. Any additional movements help to burn calories. Taking the stairs rather than the elevator and parking at the far end of your parking lot are easy ways to start. Brisk walking for at least 30 minutes 4 or more days of the week is an excellent goal to work toward  [employer] WHAT IT MEANS TO FEEL FULL Did you know that it can take 15 minutes or more for your brain to receive the message that you've eaten? That means that, if you eat less food, but consume it slower, you may still feel satisfied. Eating a lot of fruits and vegetables can also help you feel fuller. Eat off of smaller plates so that moderate portions don't seem too small  TITRATION PLAN Will plan to follow the titration plan as below, pending patient is tolerating each dose before increasing to the next. Can slow titration if needed for tolerability.   We will start with 2.5 mg weekly and increase every 4 weeks as tolerated  If you have any questions or concerns, please reach out to us .    THANK YOU FOR CHOOSING [facility]

## 2023-10-21 ENCOUNTER — Telehealth: Payer: Self-pay

## 2023-10-21 ENCOUNTER — Other Ambulatory Visit (HOSPITAL_COMMUNITY): Payer: Self-pay

## 2023-10-21 NOTE — Telephone Encounter (Addendum)
 Pharmacy Patient Advocate Encounter   Received notification from Physician's Office that prior authorization for [facility] is required/requested.   Insurance verification completed.   The patient is insured through [facility] .   Per test claim: PA required; PA submitted to above mentioned insurance via CoverMyMeds Key/confirmation #/EOC [id number] Status is pending

## 2023-10-21 NOTE — Telephone Encounter (Signed)
 Pharmacy Patient Advocate Encounter  Received notification from [facility] that Prior Authorization for [facility] has been APPROVED from 10/21/23 to 04/18/24. Ran test claim, Copay is $4. This test claim was processed through [facility]- copay amounts may vary at other pharmacies due to pharmacy/plan contracts, or as the patient moves through the different stages of their insurance plan.

## 2023-10-21 NOTE — Telephone Encounter (Signed)
 PA request has been Submitted. New Encounter has been or will be created for follow up. For additional info see Pharmacy Prior Auth telephone encounter from 10/21/23.

## 2023-10-25 ENCOUNTER — Other Ambulatory Visit (HOSPITAL_COMMUNITY): Payer: Self-pay

## 2023-10-25 MED ORDER — WEGOVY 0.25 MG/0.5ML ~~LOC~~ SOAJ
0.2500 mg | SUBCUTANEOUS | 0 refills | Status: DC
  Filled 2023-10-25 – 2023-11-01 (×2): qty 2, 28d supply, fill #0

## 2023-10-25 NOTE — Addendum Note (Signed)
 Addended by: Kelsha Older L on: 10/25/2023 06:38 AM   Modules accepted: Orders

## 2023-10-28 ENCOUNTER — Encounter: Payer: Self-pay | Admitting: Family

## 2023-11-01 ENCOUNTER — Ambulatory Visit: Admitting: Family

## 2023-11-01 ENCOUNTER — Other Ambulatory Visit (HOSPITAL_BASED_OUTPATIENT_CLINIC_OR_DEPARTMENT_OTHER): Payer: Self-pay

## 2023-11-01 ENCOUNTER — Encounter: Payer: Self-pay | Admitting: Family

## 2023-11-01 ENCOUNTER — Other Ambulatory Visit (HOSPITAL_COMMUNITY): Payer: Self-pay

## 2023-11-01 VITALS — BP 136/84 | HR 110 | Ht 62.0 in | Wt 302.8 lb

## 2023-11-01 DIAGNOSIS — R61 Generalized hyperhidrosis: Secondary | ICD-10-CM

## 2023-11-01 DIAGNOSIS — M25552 Pain in left hip: Secondary | ICD-10-CM

## 2023-11-01 DIAGNOSIS — G8929 Other chronic pain: Secondary | ICD-10-CM | POA: Diagnosis not present

## 2023-11-01 NOTE — Progress Notes (Signed)
 Samantha Romero is a 48 y.o. female with the following history as recorded in [employer]:  Patient Active Problem List   Diagnosis Date Noted   HFrEF (heart failure with reduced ejection fraction) (HCC) 12/29/2022   OSA on CPAP 12/29/2022   Morbid obesity (HCC) 12/29/2022   Mixed anxiety and depressive disorder 07/12/2022   Left hip pain 09/30/2020   Environmental and seasonal allergies 07/24/2019   Asthma 10/06/2018   Hypertensive urgency 10/06/2018   GERD (gastroesophageal reflux disease) 10/06/2018   Depression with anxiety 10/06/2018   CHF (congestive heart failure), NYHA class II, acute, systolic (HCC) 10/06/2018   Shortness of breath    Cardiomegaly    Snoring 10/04/2018   Essential hypertension 08/14/2018   Moderate persistent asthma with acute exacerbation 08/14/2018   Fibromyalgia 11/18/2016   Class 3 severe obesity due to excess calories without serious comorbidity with body mass index (BMI) of 45.0 to 49.9 in adult 11/18/2016   Adjustment reaction with anxiety and depression 11/18/2016   Chronic bilateral low back pain without sciatica 09/22/2015   Myofascial pain 09/22/2015   Lapband port leaking-REVISED May 2013 09/10/2011   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 150 mL 1   albuterol  (VENTOLIN  HFA) 108 (90 Base) MCG/ACT inhaler Inhale 2 puffs into the lungs every 6 (six) hours as needed for wheezing or shortness of breath. 8 g 2   ALPRAZolam  (XANAX ) 1 MG tablet Take 1 tablet (1 mg total) by mouth 2 (two) times daily as needed for anxiety. 60 tablet 2   azelastine  (ASTELIN ) 0.1 % nasal spray Place 2 sprays into both nostrils 2 (two) times daily. 90 mL 3   Blood Pressure Monitoring (OMRON 3 SERIES BP MONITOR) DEVI Use daily as directed. 1 each 0    buPROPion  (WELLBUTRIN  XL) 150 MG 24 hr tablet TAKE 3 TABLETS BY MOUTH DAILY 270 tablet 0   carvedilol  (COREG ) 25 MG tablet TAKE 1 TABLET (25 MG TOTAL) BY MOUTH TWICE A DAY WITH MEALS 180 tablet 3   DULoxetine  (CYMBALTA ) 30 MG capsule Take 1 capsule (30 mg total) by mouth daily. 30 capsule 5   DULoxetine  (CYMBALTA ) 60 MG capsule Take 1 capsule (60 mg total) by mouth daily. 30 capsule 5   empagliflozin  (JARDIANCE ) 10 MG TABS tablet Take 1 tablet (10 mg total) by mouth every morning before breakfast. 90 tablet 3   furosemide  (LASIX ) 40 MG tablet Take 1 tablet (40 mg total) by mouth daily. You may take 1 extra tablet (40 mg total) by mouth daily as needed for lower extremity swelling/weight gain. 105 tablet 2   levocetirizine (XYZAL ) 5 MG tablet Take 1 tablet (5 mg total) by mouth every evening. 90 tablet 3   levonorgestrel (MIRENA, 52 MG,) 20 MCG/DAY IUD      lisdexamfetamine (VYVANSE ) 50 MG capsule Take 1 capsule (50 mg total) by mouth daily. 30 capsule 0   montelukast  (SINGULAIR ) 10 MG tablet TAKE 1 TABLET BY MOUTH EVERYDAY AT BEDTIME 90 tablet 3   omeprazole  (PRILOSEC) 40 MG capsule TAKE 1 CAPSULE BY MOUTH EVERY DAY 90 capsule 3   sacubitril -valsartan  (ENTRESTO ) 97-103 MG Take 1 tablet by mouth 2 (two) times daily. 180 tablet 3   Semaglutide -Weight Management (WEGOVY ) 0.25 MG/0.5ML SOAJ Inject 0.25 mg into the skin  once a week for 4 weeks. 2 mL 0   spironolactone  (ALDACTONE ) 25 MG tablet Take 1 tablet (25 mg total) by mouth daily. 90 tablet 3   traZODone  (DESYREL ) 50 MG tablet Take 1 tablet (50 mg total) by mouth at bedtime. 30 tablet 5   bacitracin  ointment Apply 1 Application topically 2 (two) times daily. 120 g 0   cephALEXin  (KEFLEX ) 500 MG capsule Take 1 capsule (500 mg total) by mouth 3 (three) times daily. (Patient not taking: Reported on 07/29/2023) 21 capsule 0   Fluticasone -Salmeterol 232-14 MCG/ACT AEPB Inhale 1 puff into the lungs in the morning and at bedtime. 1 each 5    lisdexamfetamine (VYVANSE ) 50 MG capsule Take 1 capsule (50 mg total) by mouth daily. (Patient not taking: Reported on 09/20/2023) 30 capsule 0   No current facility-administered medications for this visit.    Allergies: Grass extracts [gramineae pollens] and Shellfish allergy  Past Medical History:  Diagnosis Date   Anxiety    panic attacks   Asthma    CHF (congestive heart failure), NYHA class II, acute, systolic (HCC) 10/06/2018   Depression    Fibromyalgia    Hypertension    no meds currently   No pertinent past medical history    Pneumonia    hx of    Sinus problem     Past Surgical History:  Procedure Laterality Date   ESOPHAGOGASTRODUODENOSCOPY N/A 11/26/2014   Procedure: ESOPHAGOGASTRODUODENOSCOPY (EGD);  Surgeon: Alm Angle, MD;  Location: [facility];  Service: General;  Laterality: N/A;   GASTRIC BANDING PORT REVISION  08/24/11   LAPAROSCOPIC GASTRIC BANDING  12/26/2007   LAPAROSCOPIC LYSIS OF ADHESIONS N/A 01/06/2015   Procedure: LAPAROSCOPIC LYSIS OF ADHESIONS;  Surgeon: Donnice Lunger, MD;  Location: [facility];  Service: General;  Laterality: N/A;   LAPAROSCOPIC REPAIR AND REMOVAL OF GASTRIC BAND N/A 01/06/2015   Procedure: LAPAROSCOPIC RESIGHTING OF GASTRIC Band PORT;  Surgeon: Donnice Lunger, MD;  Location: [facility];  Service: General;  Laterality: N/A;   NO PAST SURGERIES      Family History  Problem Relation Age of Onset   Ulcers Mother    Hypertension Mother    Cancer Maternal Uncle        lung   Cancer Maternal Grandfather        lung and back    Social History   Tobacco Use   Smoking status: Never   Smokeless tobacco: Never  Substance Use Topics   Alcohol use: Never    Subjective:   Presents with concerns for lifetime concerns about excessive sweating;  Planning to see her GYN- pap smear/ mammogram done in office;  Planning to meet with new therapist next week; does feel like mood has improved since last OV here;  Would like to be referred to  ortho about left hip; symptoms x 3 years;    Objective:  Vitals:   11/01/23 0913  BP: 136/84  Pulse: (!) 110  SpO2: 95%  Weight: (!) 302 lb 12.8 oz (137.3 kg)  Height: 5' 2 (1.575 m)    General: Well developed, well nourished, in no acute distress  Skin : Warm and dry.  Head: Normocephalic and atraumatic  Lungs: Respirations unlabored; clear to auscultation bilaterally without wheeze, rales, rhonchi  CVS exam: normal rate and regular rhythm.  Neurologic: Alert and oriented; speech intact; face symmetrical; moves all extremities well; CNII-XII intact without focal deficit   Assessment:  1. Chronic left hip pain   2. Excessive sweating  Plan:  Refer to orthopedist; Refer to dermatology;  Patient plans to schedule CPE with her GYN for pap smear/ mammogram;  She will be meeting with new therapist next week;   Return in about 9 months (around 07/31/2024) for follow up.  Orders Placed This Encounter  Procedures   Ambulatory referral to Orthopedic Surgery    Referral Priority:   Routine    Referral Type:   Surgical    Referral Reason:   Specialty Services Required    Requested Specialty:   Orthopedic Surgery    Number of Visits Requested:   1   Ambulatory referral to Dermatology    Referral Priority:   Routine    Referral Type:   Consultation    Referral Reason:   Specialty Services Required    Requested Specialty:   Dermatology    Number of Visits Requested:   1    Requested Prescriptions    No prescriptions requested or ordered in this encounter

## 2023-11-07 ENCOUNTER — Ambulatory Visit: Admitting: Clinical

## 2023-11-07 DIAGNOSIS — F909 Attention-deficit hyperactivity disorder, unspecified type: Secondary | ICD-10-CM

## 2023-11-07 DIAGNOSIS — F4323 Adjustment disorder with mixed anxiety and depressed mood: Secondary | ICD-10-CM
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
## 2023-11-08 ENCOUNTER — Encounter: Payer: Self-pay | Admitting: Clinical

## 2023-11-11 ENCOUNTER — Ambulatory Visit: Admitting: Orthopaedic Surgery

## 2023-11-11 ENCOUNTER — Other Ambulatory Visit (HOSPITAL_BASED_OUTPATIENT_CLINIC_OR_DEPARTMENT_OTHER): Payer: Self-pay

## 2023-11-11 ENCOUNTER — Other Ambulatory Visit (INDEPENDENT_AMBULATORY_CARE_PROVIDER_SITE_OTHER): Payer: Self-pay

## 2023-11-11 VITALS — Ht 63.0 in | Wt 304.0 lb

## 2023-11-11 DIAGNOSIS — M25552 Pain in left hip: Secondary | ICD-10-CM

## 2023-11-11 DIAGNOSIS — M25511 Pain in right shoulder: Secondary | ICD-10-CM | POA: Diagnosis not present

## 2023-11-11 MED ORDER — METHOCARBAMOL 750 MG PO TABS
750.0000 mg | ORAL_TABLET | Freq: Two times a day (BID) | ORAL | 3 refills | Status: AC | PRN
  Filled 2023-11-11: qty 20, 10d supply, fill #0

## 2023-11-11 MED ORDER — METHYLPREDNISOLONE 4 MG PO TBPK
ORAL_TABLET | ORAL | 0 refills | Status: AC
  Filled 2023-11-11: qty 21, 6d supply, fill #0

## 2023-11-11 NOTE — Progress Notes (Signed)
 Office Visit Note   Patient: Samantha Romero           Date of Birth: March 12, 1976           MRN: [id number] Visit Date: 11/11/2023              Requested by: Jason Leita Repine, FNP [address] [place],  [place] [address] PCP: Jason Leita Repine, FNP   Assessment & Plan: Visit Diagnoses:  1. Pain in left hip   2. Acute pain of right shoulder     Plan: History of Present Illness Samantha Romero is a 48 year old female who presents with left hip pain.  She experiences pain in the left hip, specifically in the lower back and upper buttock area, extending to the side of the hip. The pain worsens with sitting and movement, accompanied by painful 'little pops.' She uses a massage gun and heating pad for relief. The pain has intensified recently, especially when driving.  She attended physical therapy in 2023, which was beneficial, but discontinued due to a job change and relocation. She denies groin pain, numbness, tingling, or burning pain down her legs. Recently, she has experienced sensations akin to a pinched nerve, with occasional sharp pain.  Physical Exam MUSCULOSKELETAL: Tenderness on the side of the left hip. No midline tenderness in lumbar spine. Tenderness in lumbar paraspinous muscles.  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 X-rays show mild lumbar facet arthritis, well-preserved hip joint space, no significant lumbar spine degeneration. Symptoms include lower back, upper buttock, and hip pain, worsened by sitting and movement. No sciatica or nerve compression signs. - Refer to  physical [facility] at [facility] or [facility]. - Prescribe short course of steroids. - Consider muscle relaxers if needed. - Coordinate medication pickup at [facility].  Left shoulder strain with mild osteoarthritis Recent left shoulder strain with mild osteoarthritis. Symptoms include tenderness and pain on movement, no functional loss. X-rays show mild arthritis. - Advise continued use of heating pad and topical analgesics like Bengay. - Consider short course of steroids for inflammation.  Follow-Up Instructions: No follow-ups on file.   Orders:  Orders Placed This Encounter  Procedures   XR Shoulder Right   XR HIP UNILAT W OR W/O PELVIS 2-3 VIEWS LEFT   XR Lumbar Spine 2-3 Views   Ambulatory referral to Physical Therapy   Meds ordered this encounter  Medications   methylPREDNISolone  (MEDROL  DOSEPAK) 4 MG TBPK tablet    Sig: Take as directed    Dispense:  21 tablet    Refill:  0   methocarbamol  (ROBAXIN ) 750 MG tablet    Sig: Take 1 tablet (750 mg total) by mouth 2 (two) times daily as needed for muscle spasms.    Dispense:  20 tablet    Refill:  3      Procedures: No procedures performed   Clinical Data: No additional findings.   Subjective: Chief Complaint  Patient presents with   Left Hip - Pain   Right Shoulder - Pain    HPI  Review of Systems   Objective: Vital Signs: Ht 5' 3 (1.6 m)   Wt (!) 304 lb (  137.9 kg)   BMI 53.85 kg/m   Physical Exam  Ortho Exam  Specialty Comments:  No specialty comments available.  Imaging: No results found.   PMFS History: Patient Active Problem List   Diagnosis Date Noted   HFrEF (heart failure with reduced ejection fraction) (HCC) 12/29/2022   OSA on CPAP 12/29/2022   Morbid obesity (HCC) 12/29/2022   Mixed anxiety and depressive disorder 07/12/2022   Left hip pain 09/30/2020   Environmental and seasonal allergies 07/24/2019   Asthma 10/06/2018   Hypertensive urgency 10/06/2018   GERD  (gastroesophageal reflux disease) 10/06/2018   Depression with anxiety 10/06/2018   CHF (congestive heart failure), NYHA class II, acute, systolic (HCC) 10/06/2018   Shortness of breath    Cardiomegaly    Snoring 10/04/2018   Essential hypertension 08/14/2018   Moderate persistent asthma with acute exacerbation 08/14/2018   Fibromyalgia 11/18/2016   Class 3 severe obesity due to excess calories without serious comorbidity with body mass index (BMI) of 45.0 to 49.9 in adult 11/18/2016   Adjustment reaction with anxiety and depression 11/18/2016   Chronic bilateral low back pain without sciatica 09/22/2015   Myofascial pain 09/22/2015   Lapband port leaking-REVISED May 2013 09/10/2011   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 07/21/2011   Past Medical History:  Diagnosis Date   Anxiety    panic attacks   Asthma    CHF (congestive heart failure), NYHA class II, acute, systolic (HCC) 10/06/2018   Depression    Fibromyalgia    Hypertension    no meds currently   No pertinent past medical history    Pneumonia    hx of    Sinus problem     Family History  Problem Relation Age of Onset   Ulcers Mother    Hypertension Mother    Cancer Maternal Uncle        lung   Cancer Maternal Grandfather        lung and back    Past Surgical History:  Procedure Laterality Date   ESOPHAGOGASTRODUODENOSCOPY N/A 11/26/2014   Procedure: ESOPHAGOGASTRODUODENOSCOPY (EGD);  Surgeon: Alm Angle, MD;  Location: [facility];  Service: General;  Laterality: N/A;   GASTRIC BANDING PORT REVISION  08/24/11   LAPAROSCOPIC GASTRIC BANDING  12/26/2007   LAPAROSCOPIC LYSIS OF ADHESIONS N/A 01/06/2015   Procedure: LAPAROSCOPIC LYSIS OF ADHESIONS;  Surgeon: Donnice Lunger, MD;  Location: [facility];  Service: General;  Laterality: N/A;   LAPAROSCOPIC REPAIR AND REMOVAL OF GASTRIC BAND N/A 01/06/2015   Procedure: LAPAROSCOPIC RESIGHTING OF GASTRIC Band PORT;  Surgeon: Donnice Lunger, MD;  Location: [facility];  Service: General;   Laterality: N/A;   NO PAST SURGERIES     Social History   Occupational History   Not on file  Tobacco Use   Smoking status: Never   Smokeless tobacco: Never  Vaping Use   Vaping status: Never Used  Substance and Sexual Activity   Alcohol use: Never   Drug use: Never   Sexual activity: Not Currently    Birth control/protection: I.U.D.    Comment: Mirena

## 2023-11-14 ENCOUNTER — Encounter: Payer: Self-pay | Admitting: Clinical

## 2023-11-14 ENCOUNTER — Ambulatory Visit: Admitting: Clinical

## 2023-11-14 DIAGNOSIS — F4323 Adjustment disorder with mixed anxiety and depressed mood: Secondary | ICD-10-CM | POA: Diagnosis not present

## 2023-11-14 DIAGNOSIS — F909 Attention-deficit hyperactivity disorder, unspecified type: Secondary | ICD-10-CM | POA: Diagnosis not present

## 2023-11-14 NOTE — Progress Notes (Addendum)
 [facility] Counselor/Therapist Progress Note IN PERSON VISIT   Patient ID: MASHAYLA LAVIN, MRN: [id number],    Date: 11/14/2023  Time Spent: 55   Treatment Type: Individual Therapy  Reported Symptoms: Anxious about asking for help, Negative thoughts about herself  Mental Status Exam: Appearance:  Neat     Behavior: Appropriate  Motor: Normal  Speech/Language:  Normal Rate  Affect: Appropriate  Mood: anxious  Thought process: normal  Thought content:   WNL  Sensory/Perceptual disturbances:   WNL  Orientation: oriented to person, place, time/date, situation, and day of week  Attention: Fair  Concentration: Fair  Memory: WNL  Fund of knowledge:  Good  Insight:   Good  Judgment:  Good  Impulse Control: Fair   Risk Assessment: Danger to Self:  No Self-injurious Behavior: No Danger to Others: No Duty to Warn:no Physical Aggression / Violence:No    Subjective: Ms. Gutierres presented to be alert and anxious about asking for help from her friend & boyfriend.  She reported self-doubt and negative thoughts about herself.  Ms. Ospina reported she has made changes in trying to take her medications each day, including having food with protein within reach, water, and her medicine.   Interventions: Cognitive Behavioral Therapy and Psycho-education/Bibliotherapy Identified thoughts, feelings & actions about her situation with asking for help.  Diagnosis:Attention deficit hyperactivity disorder (ADHD), unspecified ADHD presentation  Adjustment disorder with mixed anxiety and depressed mood  Goal/Assessment/Plan  Goal Addressed : Increase consistency with taking daily medications Although she still has thoughts of not wanting to take medicine, she has made changes to increase her ability to take the medicine consistently.   Goal Addressed:  Implement assertive communication by utilizing cognitive coping strategies to increase her self-confidence Mescal was able to  identify irrational thoughts and replace them with more rational/helpful thoughts in order to implement a different behavior during the visit.  She was able to ask for help from a friend. And will plan on talking to her boyfriend after this visit and accept his help he was offering today.   Diagnosis:Attention deficit hyperactivity disorder (ADHD), unspecified ADHD presentation  Adjustment disorder with mixed anxiety and depressed mood   Confirmed follow up appointments and scheduled another one.  Rolin SHAUNNA Pouch, LCSW       Individualized Treatment Plan   Strengths: Resourceful and insightful  Supports: Mother, Friends    Goal/Needs for Treatment:  In order of importance to patient 1) Increase consistency with taking her daily medications 2) Increase her knowledge on ADHD and how it affects her daily functioning 3) Implement assertive communication by utilizing cognitive coping strategies to increase her self-confidence    Client Statement of Needs: Strategies to help her daily living    Treatment Level:Outpatient therapy  Symptoms:Inattentiveness, anxiety, & depressive sx  Client Treatment Preferences:In person/Weekly    Healthcare consumer's goal for treatment: Increase consistency with taking her daily medications   Healthcare consumer will: Actively participate in therapy, working towards healthy functioning.     *Justification for Continuation/Discontinuation of Goal: R=Revised, O=Ongoing, A=Achieved, D=Discontinued   Goal 1) Increase consistency with taking daily medications   Target Date Goal Was reviewed Status Code Progress towards goal/Likert rating  11/28/2023                              This plan has been reviewed and created by the following participants:  This plan will be reviewed at least every 12  months. Date Behavioral Health Clinician Date Guardian/Patient   11/07/2023

## 2023-11-14 NOTE — Progress Notes (Signed)
                Cailean Heacock SHAUNNA Pouch, LCSW

## 2023-11-17 ENCOUNTER — Telehealth: Payer: Self-pay | Admitting: Adult Health

## 2023-11-17 ENCOUNTER — Other Ambulatory Visit: Payer: Self-pay

## 2023-11-17 DIAGNOSIS — F909 Attention-deficit hyperactivity disorder, unspecified type: Secondary | ICD-10-CM

## 2023-11-17 MED ORDER — LISDEXAMFETAMINE DIMESYLATE 50 MG PO CAPS: 50.0000 mg | ORAL_CAPSULE | Freq: Every day | ORAL | 0 refills | Status: DC

## 2023-11-17 NOTE — Telephone Encounter (Signed)
 Pt called requesting a refill on her vyvanse  50 mg. Pharmacy is [facility] on [address] in [place]. Next appt is 12/02/23

## 2023-11-17 NOTE — Telephone Encounter (Signed)
 Pended

## 2023-11-21 ENCOUNTER — Ambulatory Visit (INDEPENDENT_AMBULATORY_CARE_PROVIDER_SITE_OTHER): Admitting: Clinical

## 2023-11-21 DIAGNOSIS — F909 Attention-deficit hyperactivity disorder, unspecified type: Secondary | ICD-10-CM | POA: Diagnosis not present

## 2023-11-21 DIAGNOSIS — F4323 Adjustment disorder with mixed anxiety and depressed mood: Secondary | ICD-10-CM

## 2023-11-21 NOTE — Progress Notes (Addendum)
 [facility] Counselor/Therapist Progress Note IN PERSON VISIT  Patient ID: Samantha Romero, MRN: [id number]    Date: 11/21/23  Time Spent: 2:07pm - 3:05pm 57 Min  Treatment Type: Individual Therapy.  Reported Symptoms: Stress symptoms, nightmares, somatic symptoms from traumatic experiences  Mental Status Exam: Appearance:  Casual     Behavior: Appropriate  Motor: Normal  Speech/Language:  Normal Rate  Affect: Appropriate  Mood: anxious and depressed  Thought process: tangential  Thought content:   WNL  Sensory/Perceptual disturbances:   WNL  Orientation: oriented to person, place, time/date, situation, and day of week  Attention: Fair  Concentration: Fair  Memory: WNL  Fund of knowledge:  Good  Insight:   Good  Judgment:  Good  Impulse Control: Fair   Risk Assessment: Danger to Self:  No Self-injurious Behavior: No Danger to Others: No Duty to Warn:no Physical Aggression / Violence:No    Subjective:   Samantha Romero presented to be alert and open to talking about her thoughts & feelings.  She wants to be more honest with herself & others on how she really feels & thinks.  Interventions: Cognitive Behavioral Therapy - Psycho education on [employer], thoughts, feelings & actions. And somatic symptoms. Psycho education on trauma responses.  Patient Response: Samantha Romero was able to acknowledge the effects of various traumatic experiences she had and open to sharing more about her thoughts & feelings.  Samantha Romero increased her knowledge about how thoughts, feelings & actions are connected, using the CBT triangle.  And she identified alternative thoughts & actions to help her feel less anxious about specific situations.  Diagnosis:  Adjustment disorder with mixed anxiety and depressed mood  Attention deficit hyperactivity disorder (ADHD), unspecified ADHD type   Goals, Assessment & Plan:   Continue Individual Therapy Frequency: bi-weekly  Modality: individual     Short-term goal:  Goal Addressed : Increase consistency with taking daily medications Although she still has thoughts of not wanting to take medicine, she has made changes to increase her ability to take the medicine consistently.   Target Date: 11/28/2023  Progress: Ongoing     Long-term goal:   Goal Addressed:  Implement assertive communication by utilizing cognitive coping strategies to increase her self-confidence as evidenced by self-report  Target Date: 12/28/2023  Progress: Ongoing    Teryn Gust P. Trudy, MSW, LCSW [employer] Therapist Main Office: [phone]              Rolin SHAUNNA Trudy, [place]

## 2023-11-22 ENCOUNTER — Encounter: Payer: Self-pay | Admitting: Clinical

## 2023-11-29 ENCOUNTER — Ambulatory Visit (INDEPENDENT_AMBULATORY_CARE_PROVIDER_SITE_OTHER): Admitting: Clinical

## 2023-11-29 DIAGNOSIS — F909 Attention-deficit hyperactivity disorder, unspecified type: Secondary | ICD-10-CM | POA: Diagnosis not present

## 2023-11-29 DIAGNOSIS — F341 Dysthymic disorder: Secondary | ICD-10-CM | POA: Diagnosis not present

## 2023-11-29 NOTE — Progress Notes (Addendum)
 [facility] Counselor/Therapist Progress Note IN PERSON VISIT   Patient ID: Samantha KEENUM, MRN: [id number]    Date: 11/29/2023  Time Spent: 49 min  Treatment Type: Individual Therapy.  Reported Symptoms: Withdrawn & Isolating herself  Mental Status Exam: Appearance:  Casual     Behavior: More Talkative than previous visits  Motor: Restless  Speech/Language:  Faster rate than previous visits  Affect: Appropriate  Mood: anxious  Thought process: tangential  Thought content:   WNL  Sensory/Perceptual disturbances:   WNL  Orientation: oriented to person, place, time/date, situation, and day of week  Attention: Fair  Concentration: Fair  Memory: WNL  Fund of knowledge:  Good  Insight:   Good  Judgment:  Good  Impulse Control: Fair    Risk Assessment: No reported SI Danger to Self:  No Self-injurious Behavior: No Danger to Others: No Duty to Warn:no Physical Aggression / Violence:No    Subjective:  Samantha Romero presented to be alert and motivated to talk about what was on her mind.  She was running late but arrived for the visit in a different office.  She came with a notebook to take notes in.   Interventions:  Cognitive Behavioral Therapy - Practiced identifying, thoughts, feelings & actions in a situation that she is struggling with.  Client Response:  Samantha Romero was able to increase her insight about her recent thoughts, feelings & actions.  She reported that she has not left her house since the last therapy visit.  She acknowledged that her recent actions are similar to what led her to do a partial hospitalization program last year when she had major depression.  She reported she wants to re-connect with her friends but does not have the place like she use to have to do things they enjoy.    Samantha Romero actively engaged in identifying thoughts & behaviors that are making her feel anxious or depressed during the visit.  She was able to identify alternative thoughts &  actions to have different feelings.   Diagnosis:  Persistent depressive disorder  Attention deficit hyperactivity disorder (ADHD), unspecified ADHD type   Goals, Assessment & Plan:   Continue Individual Therapy Frequency: Weekly  Modality: individual    Short-term goal:   Goal Addressed : Increase consistency with taking daily medications  - Taking morning medications consistently - However, she has not been taking her medication at night due to the side effects of the trazodone .She decided to take out the trazodone  in the pill box since it's as needed and will plan on taking the others.  Target Date: 11/28/2023  Progress: Ongoing     Long-term goal:   Goal Addressed:  Implement assertive communication by utilizing cognitive coping strategies to increase her self-confidence as evidenced by self-report  During today's visit, Samantha Romero decided she will communicate to one of her friends about getting together.  And respond directly to another friend about a situation that led them to not talk to each other for about a year.  Target Date: 12/28/2023  Progress: Ongoing    Benito Lemmerman P. Trudy, MSW, LCSW [employer] Therapist Main Office: [phone]

## 2023-12-01 ENCOUNTER — Telehealth: Admitting: Adult Health

## 2023-12-01 ENCOUNTER — Encounter: Payer: Self-pay | Admitting: Adult Health

## 2023-12-01 DIAGNOSIS — G4700 Insomnia, unspecified: Secondary | ICD-10-CM

## 2023-12-01 DIAGNOSIS — F411 Generalized anxiety disorder: Secondary | ICD-10-CM

## 2023-12-01 DIAGNOSIS — F329 Major depressive disorder, single episode, unspecified: Secondary | ICD-10-CM | POA: Diagnosis not present

## 2023-12-01 DIAGNOSIS — F909 Attention-deficit hyperactivity disorder, unspecified type: Secondary | ICD-10-CM

## 2023-12-01 DIAGNOSIS — F332 Major depressive disorder, recurrent severe without psychotic features: Secondary | ICD-10-CM

## 2023-12-01 MED ORDER — DULOXETINE HCL 60 MG PO CPEP: 60.0000 mg | ORAL_CAPSULE | Freq: Every day | ORAL | 5 refills | Status: AC

## 2023-12-01 MED ORDER — ALPRAZOLAM 1 MG PO TABS: 1.0000 mg | ORAL_TABLET | Freq: Two times a day (BID) | ORAL | 2 refills | Status: AC | PRN

## 2023-12-01 MED ORDER — BUPROPION HCL ER (XL) 150 MG PO TB24: 450.0000 mg | ORAL_TABLET | Freq: Every day | ORAL | 2 refills | Status: DC

## 2023-12-01 MED ORDER — LISDEXAMFETAMINE DIMESYLATE 50 MG PO CAPS: 50.0000 mg | ORAL_CAPSULE | Freq: Every day | ORAL | 0 refills | Status: AC

## 2023-12-01 MED ORDER — TRAZODONE HCL 50 MG PO TABS: 50.0000 mg | ORAL_TABLET | Freq: Every day | ORAL | 5 refills | Status: AC

## 2023-12-01 MED ORDER — LISDEXAMFETAMINE DIMESYLATE 50 MG PO CAPS
50.0000 mg | ORAL_CAPSULE | Freq: Every day | ORAL | 0 refills | Status: AC
Start: 2023-12-01 — End: ?

## 2023-12-01 MED ORDER — DULOXETINE HCL 30 MG PO CPEP: 30.0000 mg | ORAL_CAPSULE | Freq: Every day | ORAL | 5 refills | Status: AC

## 2023-12-01 NOTE — Progress Notes (Signed)
 Samantha Romero [id number] 1975-05-19 48 y.o.  Virtual Visit via Video Note  I connected with pt @ on 12/01/23 at  5:00 PM EDT by a video enabled telemedicine application and verified that I am speaking with the correct person using two identifiers.   I discussed the limitations of evaluation and management by telemedicine and the availability of in person appointments. The patient expressed understanding and agreed to proceed.  I discussed the assessment and treatment plan with the patient. The patient was provided an opportunity to ask questions and all were answered. The patient agreed with the plan and demonstrated an understanding of the instructions.   The patient was advised to call back or seek an in-person evaluation if the symptoms worsen or if the condition fails to improve as anticipated.  I provided 25 minutes of non-face-to-face time during this encounter.  The patient was located at home.  The provider was located at [facility].   Samantha LOISE Sayers, NP   Subjective:   Patient ID:  Samantha Romero is a 48 y.o. (DOB June 30, 1975) female.  Chief Complaint: No chief complaint on file.   HPI Samantha Romero presents for follow-up of MDD, GAD, ADHD, and insomnia.  Describes mood today as variable. Pleasant. Tearful at times. Mood symptoms - reports depression and anxiety. Reports decreased interest and motivation. Reports decreased irritability. Reports panic attacks - having 2 a week. Reports increased social anxiety - preventing me from getting out as much. Reports she is still isolating - not wanting to get out. Reports worry, rumination and over thinking. Reports some obsessive thoughts and acts. Reports mood had improved - but had a hiccup. Stating I feel like I'm doing fair - there is room for a lot of improvement. Reports taking medications consistently and feel like they are helpful. Working with a Paramedic. Energy levels low. Active, unable to establish  a regular exercise routine. Enjoys some usual interests and activities - recently reconnected with a friend. Living with mother. Involved in a long term relationship. Has family local - mother, brother, and extended family. Spending time with family.  Appetite decreased - making an effort to eat. Reports weight loss - but uncertain of weight. Reports sleeping better some nights than others. Reports sleeping in 4 hour windows. Reports frequent nightmares.  Reports focus and concentration improved with Vyvanse  50mg  daily. Reports journaling more. Completing some tasks. Managing minimal aspects of household. Looking for employment. Denies SI or HI.  Denies AH or VH. Denies self harm. Denies substance use.  Previous medication trials: Topamax   Review of Systems:  Review of Systems  Medications: I have reviewed the patient's current medications.  Current Outpatient Medications  Medication Sig Dispense Refill   albuterol  (PROVENTIL ) (2.5 MG/3ML) 0.083% nebulizer solution Take 3 mLs (2.5 mg total) by nebulization every 6 (six) hours as needed for wheezing or shortness of breath. 150 mL 1   albuterol  (VENTOLIN  HFA) 108 (90 Base) MCG/ACT inhaler Inhale 2 puffs into the lungs every 6 (six) hours as needed for wheezing or shortness of breath. 8 g 2   ALPRAZolam  (XANAX ) 1 MG tablet Take 1 tablet (1 mg total) by mouth 2 (two) times daily as needed for anxiety. 60 tablet 2   azelastine  (ASTELIN ) 0.1 % nasal spray Place 2 sprays into both nostrils 2 (two) times daily. 90 mL 3   Blood Pressure Monitoring (OMRON 3 SERIES BP MONITOR) DEVI Use daily as directed. 1 each 0   buPROPion  (WELLBUTRIN  XL) 150  MG 24 hr tablet TAKE 3 TABLETS BY MOUTH DAILY 270 tablet 0   carvedilol  (COREG ) 25 MG tablet TAKE 1 TABLET (25 MG TOTAL) BY MOUTH TWICE A DAY WITH MEALS 180 tablet 3   DULoxetine  (CYMBALTA ) 30 MG capsule Take 1 capsule (30 mg total) by mouth daily. 30 capsule 5   DULoxetine  (CYMBALTA ) 60 MG capsule Take 1  capsule (60 mg total) by mouth daily. 30 capsule 5   empagliflozin  (JARDIANCE ) 10 MG TABS tablet Take 1 tablet (10 mg total) by mouth every morning before breakfast. 90 tablet 3   furosemide  (LASIX ) 40 MG tablet Take 1 tablet (40 mg total) by mouth daily. You may take 1 extra tablet (40 mg total) by mouth daily as needed for lower extremity swelling/weight gain. 105 tablet 2   levocetirizine (XYZAL ) 5 MG tablet Take 1 tablet (5 mg total) by mouth every evening. 90 tablet 3   levonorgestrel (MIRENA, 52 MG,) 20 MCG/DAY IUD      lisdexamfetamine (VYVANSE ) 50 MG capsule Take 1 capsule (50 mg total) by mouth daily. (Patient not taking: Reported on 09/20/2023) 30 capsule 0   lisdexamfetamine (VYVANSE ) 50 MG capsule Take 1 capsule (50 mg total) by mouth daily. 30 capsule 0   methocarbamol  (ROBAXIN ) 750 MG tablet Take 1 tablet (750 mg total) by mouth 2 (two) times daily as needed for muscle spasms. 20 tablet 3   methylPREDNISolone  (MEDROL  DOSEPAK) 4 MG TBPK tablet Take as directed on pack for 6 days. 21 tablet 0   montelukast  (SINGULAIR ) 10 MG tablet TAKE 1 TABLET BY MOUTH EVERYDAY AT BEDTIME 90 tablet 3   omeprazole  (PRILOSEC) 40 MG capsule TAKE 1 CAPSULE BY MOUTH EVERY DAY 90 capsule 3   sacubitril -valsartan  (ENTRESTO ) 97-103 MG Take 1 tablet by mouth 2 (two) times daily. 180 tablet 3   Semaglutide -Weight Management (WEGOVY ) 0.25 MG/0.5ML SOAJ Inject 0.25 mg into the skin once a week for 4 weeks. 2 mL 0   spironolactone  (ALDACTONE ) 25 MG tablet Take 1 tablet (25 mg total) by mouth daily. 90 tablet 3   traZODone  (DESYREL ) 50 MG tablet Take 1 tablet (50 mg total) by mouth at bedtime. 30 tablet 5   No current facility-administered medications for this visit.    Medication Side Effects: None  Allergies:  Allergies  Allergen Reactions   Grass Extracts [Gramineae Pollens]     Nasal congestion, post nasal drip    Shellfish Allergy Itching    In throat only.    Past Medical History:  Diagnosis Date    Anxiety    panic attacks   Asthma    CHF (congestive heart failure), NYHA class II, acute, systolic (HCC) 10/06/2018   Depression    Fibromyalgia    Hypertension    no meds currently   No pertinent past medical history    Pneumonia    hx of    Sinus problem     Family History  Problem Relation Age of Onset   Ulcers Mother    Hypertension Mother    Cancer Maternal Uncle        lung   Cancer Maternal Grandfather        lung and back    Social History   Socioeconomic History   Marital status: Single    Spouse name: Not on file   Number of children: 0   Years of education: 16   Highest education level: Bachelor's degree (e.g., BA, AB, BS)  Occupational History   Not on file  Tobacco Use   Smoking status: Never   Smokeless tobacco: Never  Vaping Use   Vaping status: Never Used  Substance and Sexual Activity   Alcohol use: Never   Drug use: Never   Sexual activity: Not Currently    Birth control/protection: I.U.D.    Comment: Mirena  Other Topics Concern   Not on file  Social History Narrative   Not on file   Social Drivers of Health   Financial Resource Strain: Not on file  Food Insecurity: Not on file  Transportation Needs: Not on file  Physical Activity: Not on file  Stress: Not on file  Social Connections: Not on file  Intimate Partner Violence: Not on file    Past Medical History, Surgical history, Social history, and Family history were reviewed and updated as appropriate.   Please see review of systems for further details on the patient's review from today.   Objective:   Physical Exam:  There were no vitals taken for this visit.  Physical Exam Constitutional:      General: She is not in acute distress. Musculoskeletal:        General: No deformity.  Neurological:     Mental Status: She is alert and oriented to person, place, and time.     Coordination: Coordination normal.  Psychiatric:        Attention and Perception: Attention and  perception normal. She does not perceive auditory or visual hallucinations.        Mood and Affect: Mood normal. Mood is not anxious or depressed. Affect is not labile, blunt, angry or inappropriate.        Speech: Speech normal.        Behavior: Behavior normal.        Thought Content: Thought content normal. Thought content is not paranoid or delusional. Thought content does not include homicidal or suicidal ideation. Thought content does not include homicidal or suicidal plan.        Cognition and Memory: Cognition and memory normal.        Judgment: Judgment normal.     Comments: Insight intact     Lab Review:     Component Value Date/Time   NA 145 (H) 04/14/2022 1438   K 3.5 04/14/2022 1438   CL 106 04/14/2022 1438   CO2 24 04/14/2022 1438   GLUCOSE 84 04/14/2022 1438   GLUCOSE 93 10/08/2020 2012   BUN 9 04/14/2022 1438   CREATININE 0.72 04/14/2022 1438   CALCIUM 9.2 04/14/2022 1438   PROT 6.6 04/14/2022 1438   ALBUMIN 4.1 04/14/2022 1438   AST 32 04/14/2022 1438   ALT 62 (H) 04/14/2022 1438   ALKPHOS 135 (H) 04/14/2022 1438   BILITOT 0.8 04/14/2022 1438   GFRNONAA >60 10/08/2020 2012   GFRAA 115 05/08/2020 1654       Component Value Date/Time   WBC 6.7 04/14/2022 1438   WBC 8.2 10/08/2020 2012   RBC 4.61 04/14/2022 1438   RBC 4.89 10/08/2020 2012   HGB 12.8 04/14/2022 1438   HCT 40.3 04/14/2022 1438   PLT 272 04/14/2022 1438   MCV 87 04/14/2022 1438   MCH 27.8 04/14/2022 1438   MCH 27.6 10/08/2020 2012   MCHC 31.8 04/14/2022 1438   MCHC 30.8 10/08/2020 2012   RDW 12.9 04/14/2022 1438   LYMPHSABS 2.3 04/14/2022 1438   MONOABS 0.6 10/08/2020 2012   EOSABS 0.1 04/14/2022 1438   BASOSABS 0.0 04/14/2022 1438    No results found for: POCLITH,  LITHIUM   No results found for: PHENYTOIN, PHENOBARB, VALPROATE, CBMZ   .res Assessment: Plan:    Plan:  Patient currently out of work and is totally disabled. Reports ongoing emotional and physical  limitations.   Seeing therapist - Macario Blumenthal - [facility]  PDMP reviewed  Trazadone 50mg  at hs as needed   Cymbalta  60/30 daily Wellbutrin  XL 150mg  - 3 daily Xanax  1mg  up to twice a day - usually taking it once to twice a week Vyvanse  50mg  daily  RTC 6/8 weeks  25 minutes spent dedicated to the care of this patient on the date of this encounter to include pre-visit review of records, ordering of medication, post visit documentation, and face-to-face time with the patient discussing MDD, GAD, ADHD, and insomnia. Discussed continuing current medication regimen.  Patient advised to contact office with any questions, adverse effects, or acute worsening in signs and symptoms.   Discussed potential benefits, risk, and side effects of benzodiazepines to include potential risk of tolerance and dependence, as well as possible drowsiness. Advised patient not to drive if experiencing drowsiness and to take lowest possible effective dose to minimize risk of dependence and tolerance.   Discussed potential benefits, risks, and side effects of stimulants with patient to include increased heart rate, palpitations, insomnia, increased anxiety, increased irritability, or decreased appetite.  Instructed patient to contact office if experiencing any significant tolerability issues.  There are no diagnoses linked to this encounter.   Please see After Visit Summary for patient specific instructions.  Future Appointments  Date Time Provider Department Center  12/01/2023  5:00 PM Collins Dimaria, Samantha Mattocks, NP [facility] None  12/05/2023  2:00 PM Trudy Rolin SQUIBB, LCSW [facility] None  12/12/2023  2:00 PM Trudy Rolin SQUIBB, LCSW [facility] None  12/19/2023  2:00 PM Trudy Rolin SQUIBB, LCSW [facility] None  12/26/2023  2:00 PM Trudy Rolin SQUIBB, LCSW [facility] None  12/27/2023  1:45 PM Dow Alm PARAS, PT [id number]  01/11/2024  3:00 PM Orman Erminio POUR, PA-C [facility] None    No orders of the defined  types were placed in this encounter.     -------------------------------

## 2023-12-05 ENCOUNTER — Ambulatory Visit: Admitting: Clinical

## 2023-12-05 DIAGNOSIS — F341 Dysthymic disorder: Secondary | ICD-10-CM
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
## 2023-12-06 ENCOUNTER — Telehealth: Payer: Self-pay | Admitting: Adult Health

## 2023-12-06 NOTE — Telephone Encounter (Signed)
 LVM to [facility]
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
## 2023-12-07 NOTE — Telephone Encounter (Signed)
 Left second VM to RC, call goes straight to VM.

## 2023-12-12 ENCOUNTER — Ambulatory Visit: Admitting: Clinical

## 2023-12-12 DIAGNOSIS — F909 Attention-deficit hyperactivity disorder, unspecified type: Secondary | ICD-10-CM

## 2023-12-12 DIAGNOSIS — F341 Dysthymic disorder: Secondary | ICD-10-CM
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
## 2023-12-13 ENCOUNTER — Encounter: Payer: Self-pay | Admitting: Clinical

## 2023-12-15 ENCOUNTER — Ambulatory Visit: Admitting: Orthopaedic Surgery

## 2023-12-19 ENCOUNTER — Ambulatory Visit: Admitting: Clinical

## 2023-12-19 ENCOUNTER — Telehealth: Payer: Self-pay | Admitting: Clinical

## 2023-12-19 NOTE — Progress Notes (Unsigned)
 [facility] Counselor/Therapist Progress Note - IN-PERSON  Patient ID: Samantha Romero, MRN: [id number]    Date: ***  Time Spent: ***   - ***  : *** Minutes  Types of Service: {CHL AMB TYPE OF SERVICE:2048507138}  Presenting Concerns: ***  Mental Status Exam: Appearance:  {PSY:22683}     Behavior: {PSY:21022743}  Motor: {PSY:22302}  Speech/Language:  {PSY:22685}  Affect: {PSY:22687}  Mood: {PSY:31886}  Thought process: {PSY:31888}  Thought content:   {PSY:631-584-2717}  Sensory/Perceptual disturbances:   {[phone]}  Orientation: {PSY:30297}  Attention: {PSY:22877}  Concentration: {PSY:276-787-8543}  Memory: {PSY:7577839633}  Fund of knowledge:  {PSY:276-787-8543}  Insight:   {PSY:276-787-8543}  Judgment:  {PSY:276-787-8543}  Impulse Control: {PSY:276-787-8543}   Risk Assessment: Danger to Self:  {PSY:22692} Self-injurious Behavior: {PSY:22692} Danger to Others: {PSY:22692} Duty to Warn:{PSY:311194}   Subjective:   ***   Interventions: {[phone]}  Client Response: ***   Diagnosis:  No diagnosis found.   Goals, Assessment & Plan:   *** Frequency: ***  Modality: ***    Goal Addressed:  Implement assertive communication by utilizing cognitive coping strategies to increase her self-confidence as evidenced by self-report  ***  - Alvia continues to make direct asks of her friends and has communicated her needs with others, trying to set healthy boundaries for herself.   Target Date: ***  Progress: Ongoing       Goal: ***  Target Date: ***  Progress: ***    Johnattan Strassman P. Trudy, MSW, LCSW [employer] Therapist Main Office: [phone]
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
## 2023-12-20 ENCOUNTER — Ambulatory Visit: Admitting: Family

## 2023-12-20 ENCOUNTER — Encounter: Payer: Self-pay | Admitting: Pharmacist Clinician (PhC)/ Clinical Pharmacy Specialist

## 2023-12-21 MED ORDER — WEGOVY 0.5 MG/0.5ML ~~LOC~~ SOAJ: 0.5000 mg | SUBCUTANEOUS | 0 refills | Status: DC

## 2023-12-26 ENCOUNTER — Ambulatory Visit: Admitting: Clinical

## 2023-12-26 DIAGNOSIS — F909 Attention-deficit hyperactivity disorder, unspecified type: Secondary | ICD-10-CM | POA: Diagnosis not present

## 2023-12-26 DIAGNOSIS — F341 Dysthymic disorder: Secondary | ICD-10-CM
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
## 2023-12-27 ENCOUNTER — Ambulatory Visit (HOSPITAL_BASED_OUTPATIENT_CLINIC_OR_DEPARTMENT_OTHER): Attending: Orthopaedic Surgery | Admitting: Physical Therapy

## 2023-12-27 ENCOUNTER — Encounter (HOSPITAL_BASED_OUTPATIENT_CLINIC_OR_DEPARTMENT_OTHER): Payer: Self-pay

## 2024-01-02 ENCOUNTER — Other Ambulatory Visit: Payer: Self-pay | Admitting: Adult Health

## 2024-01-02 ENCOUNTER — Ambulatory Visit: Admitting: Clinical

## 2024-01-02 ENCOUNTER — Other Ambulatory Visit (HOSPITAL_BASED_OUTPATIENT_CLINIC_OR_DEPARTMENT_OTHER): Payer: Self-pay

## 2024-01-02 ENCOUNTER — Other Ambulatory Visit: Payer: Self-pay | Admitting: Cardiology

## 2024-01-02 DIAGNOSIS — F332 Major depressive disorder, recurrent severe without psychotic features: Secondary | ICD-10-CM

## 2024-01-02 DIAGNOSIS — F341 Dysthymic disorder: Secondary | ICD-10-CM | POA: Diagnosis not present

## 2024-01-02 MED ORDER — BUPROPION HCL ER (XL) 150 MG PO TB24
450.0000 mg | ORAL_TABLET | Freq: Every day | ORAL | 2 refills | Status: DC
  Filled 2024-01-02 – 2024-01-30 (×2): qty 90, 30d supply, fill #0
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
## 2024-01-03 ENCOUNTER — Other Ambulatory Visit (HOSPITAL_BASED_OUTPATIENT_CLINIC_OR_DEPARTMENT_OTHER): Payer: Self-pay

## 2024-01-03 ENCOUNTER — Telehealth: Payer: Self-pay | Admitting: Pharmacy Technician

## 2024-01-03 MED ORDER — WEGOVY 1 MG/0.5ML ~~LOC~~ SOAJ
1.0000 mg | SUBCUTANEOUS | 0 refills | Status: AC
  Filled 2024-01-03 – 2024-05-04 (×4): qty 2, 28d supply, fill #0

## 2024-01-03 NOTE — Telephone Encounter (Signed)
 Hi, insurance is asking what the patient's last weight was in the last 45 days. Can this be documented so I can send to insurance, thank you  Pharmacy Patient Advocate Encounter   Received notification from Onbase that prior authorization for wegovy  1mg  is required/requested.   Insurance verification completed.   The patient is insured through [employer].   Per test claim: PA required; PA started via CoverMyMeds. KEY [id number] . Please see clinical question(s) below that I am not finding the answer to in their chart and advise.

## 2024-01-04 ENCOUNTER — Other Ambulatory Visit (HOSPITAL_BASED_OUTPATIENT_CLINIC_OR_DEPARTMENT_OTHER): Payer: Self-pay

## 2024-01-04 MED ORDER — CARVEDILOL 25 MG PO TABS
25.0000 mg | ORAL_TABLET | Freq: Two times a day (BID) | ORAL | 2 refills | Status: AC
  Filled 2024-01-04 – 2024-01-30 (×2): qty 180, 90d supply, fill #0

## 2024-01-05 ENCOUNTER — Other Ambulatory Visit (HOSPITAL_BASED_OUTPATIENT_CLINIC_OR_DEPARTMENT_OTHER): Payer: Self-pay

## 2024-01-05 NOTE — Telephone Encounter (Signed)
Message sent to Pharm D for advice.

## 2024-01-05 NOTE — Telephone Encounter (Signed)
 Pharmacy Patient Advocate Encounter  Received notification from [employer] that Prior Authorization for WEGOVY  has been DENIED.  Full denial letter will be uploaded to the media tab. See denial reason below.   PA #/Case ID/Reference #: I submitted her mychart message where she wrote me saying what her current weight was but they did not accept that. They said it has to be documented in the chart notes her weight and bmi. If this can be done, I can send that in with the  appeal

## 2024-01-05 NOTE — Telephone Encounter (Signed)
 Pharmacy Patient Advocate Encounter   Received notification from Pt Calls Messages that prior authorization for wegovy  is required/requested.   Insurance verification completed.   The patient is insured through [employer].   Per test claim: PA required; PA submitted to above mentioned insurance via Latent Key/confirmation #/EOC [id number] Status is pending

## 2024-01-06 ENCOUNTER — Other Ambulatory Visit (HOSPITAL_BASED_OUTPATIENT_CLINIC_OR_DEPARTMENT_OTHER): Payer: Self-pay

## 2024-01-09 ENCOUNTER — Ambulatory Visit: Admitting: Clinical

## 2024-01-09 ENCOUNTER — Other Ambulatory Visit: Payer: Self-pay

## 2024-01-09 ENCOUNTER — Other Ambulatory Visit (HOSPITAL_BASED_OUTPATIENT_CLINIC_OR_DEPARTMENT_OTHER): Payer: Self-pay

## 2024-01-09 DIAGNOSIS — F341 Dysthymic disorder: Secondary | ICD-10-CM
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
## 2024-01-11 ENCOUNTER — Other Ambulatory Visit (HOSPITAL_BASED_OUTPATIENT_CLINIC_OR_DEPARTMENT_OTHER): Payer: Self-pay

## 2024-01-11 ENCOUNTER — Ambulatory Visit: Admitting: Physician Assistant

## 2024-01-11 ENCOUNTER — Encounter: Payer: Self-pay | Admitting: Physician Assistant

## 2024-01-11 VITALS — BP 140/89

## 2024-01-11 DIAGNOSIS — L74519 Primary focal hyperhidrosis, unspecified: Secondary | ICD-10-CM | POA: Diagnosis not present

## 2024-01-11 MED ORDER — GLYCOPYRROLATE 1 MG PO TABS
1.0000 mg | ORAL_TABLET | ORAL | 2 refills | Status: AC
  Filled 2024-01-11 – 2024-01-30 (×2): qty 90, 30d supply, fill #0

## 2024-01-11 NOTE — Patient Instructions (Signed)

## 2024-01-11 NOTE — Progress Notes (Signed)
 New Patient Visit   Subjective  Samantha Romero is a 48 y.o. female NEW PATIENT who presents for the following: Excessive sweating of scalp and face for years. In the past she had excessive sweating of the underarms but she used drysol for a year and it seemed to improve. She recently lost a job due to her facial sweating. She was told she did not look professional. She has to keep her hair in braids most of the time because of the sweating of her scalp. She has had to stop wearing makeup because it just runs. She states that sunscreens and moisturizers just bead up on her skin. She says she is fine at home or in her car but she sweats profusely in social settings. She has tried blotting papers and tries to keep a fan on her as much as she can. She has a family history of hyperhidrosis.    The following portions of the chart were reviewed this encounter and updated as appropriate: medications, allergies, medical history  Review of Systems:  No other skin or systemic complaints except as noted in HPI or Assessment and Plan.  Objective  Well appearing patient in no apparent distress; mood and affect are within normal limits.   A focused examination was performed of the following areas: Scalp, face   Relevant exam findings are noted in the Assessment and Plan.    Assessment & Plan   HYPERHIDROSIS Exam: increased moisture of scalp and face   Counseling on Hyperhidrosis and Coordination of Care Hyperhidrosis is a chronic condition of increased perspiration in excess of the body's need for thermoregulation. It may or may not be associated with bromhidrosis (odor). Hyperhidrosis may be primary or secondary.  In both cases, the main symptom is excessive perspiration.  Primary hyperhidrosis is focal sweating of unknown cause; triggers may include emotions (eg, anxiety), physical activity, heat, and spicy food. Cause is not fully understood but is thought to result from overactivity of sweat  glands due to neural (nerve) pathways. Primary hyperhidrosis occurs in both children and adults. It often begins in teenage years. It is less common in elderly individuals. It is estimated to affect between 1% and 3% of the population. A positive family history is common.  Symptoms-  Most commonly, excess sweat can be seen bilaterally on the palms of the hands, soles of the feet, craniofacial region, and axillae. When the skin is not excessively moist, it can be extremely dry with itchy, flaky, and sometimes cracked skin. Sweating generally subsides while sleeping.  Secondary hyperhidrosis is usually generalized and is associated with an underlying medical condition (eg, metabolic disorder, neurologic condition, infection, or malignancy) or medication use. Symptoms - Generalized sweating, nocturnal sweating (night sweats).  If secondary hyperhidrosis is suspected, laboratory tests (thyroid  function, CBC, blood glucose, renal and liver function, erythrocyte sedimentation rate, etc) may be useful to help identify an underlying cause. A purified protein derivative (PPD) skin test or interferon gamma release assays (T-SPOT and QuantiFERON-TB Gold) can screen for tuberculosis.  Treatment for Primary Hyperhidrosis may include: - topicals such as Drysol; Carpe; CertainDri; Qbrexa (topical glycopyrrolate ); Sofpironium gel anticholinergic); Topical Oxybutynin 3% gel. - Iontopheresis; - Injectable Botox; - systemic treatments such as oral Glycopyrrolate ; oral Oxybutynin; oral Clonidine. - Microwave Thermolysis (MiraDry) - Axillary curettage or Liposuction of Axillary sweat glands. - Surgical Sypathectomy.    Treatment Plan: Start Robinul  1 mg Take 1-3 tablets daily - Advised patient to take 1 tablet daily. If not improving, send  a MyChart message and we will increase as tolerated.      PRIMARY FOCAL HYPERHIDROSIS    Return in about 3 months (around 04/12/2024) for hyperhidrosis.  I, Roseline Hutchinson,  CMA, am acting as scribe for Tomasita Beevers K, PA-C .   Documentation: I have reviewed the above documentation for accuracy and completeness, and I agree with the above.  Jaimin Krupka K, PA-C

## 2024-01-16 ENCOUNTER — Ambulatory Visit: Admitting: Clinical

## 2024-01-16 DIAGNOSIS — F909 Attention-deficit hyperactivity disorder, unspecified type: Secondary | ICD-10-CM | POA: Diagnosis not present

## 2024-01-16 DIAGNOSIS — F341 Dysthymic disorder: Secondary | ICD-10-CM

## 2024-01-16 NOTE — Progress Notes (Signed)
 [facility] Counselor/Therapist Progress Note - IN-PERSON  Patient ID: Samantha Romero, MRN: [id number]    Date: 01/16/2024  Time Spent: 2:04pm   - 3pm  : 56 Minutes  Types of Service: Individual psychotherapy  Presenting Concerns:  Ongoing stressors with limited finances Recently found out two of their friends died due to cancer this past week.  Mental Status Exam: Appearance:  Well Groomed     Behavior: Appropriate  Motor: Normal  Speech/Language:  Normal Rate  Affect: Appropriate  Mood: anxious and depressed  Thought process: normal  Thought content:   WNL  Sensory/Perceptual disturbances:   WNL  Orientation: oriented to person, place, time/date, and situation  Attention: Good  Concentration: Good  Memory: WNL  Fund of knowledge:  Good  Insight:   Good  Judgment:  Good  Impulse Control: Good   Risk Assessment: Danger to Self:  No Self-injurious Behavior: No Danger to Others: No Duty to Warn:no   Subjective:  Samantha Romero shared about recent deaths of friends from cancer. Samantha Romero also reported experiencing bad dreams from previous traumatic events. Samantha Romero also reported having difficulties with managing her time this morning and felt unsettled but was able to work on refocusing herself.   Interventions: Cognitive Behavioral Therapy, Identified strengths, accomplishments and ongoing challenges.   Client Response: Samantha Romero reported feeling tired with intentionally challenging unhelpful thoughts that makes her feel depressed and doing different things that she use to do, eg being more direct in communicating with others and prioritizing her needs.  Samantha Romero reported that spending time with her 53 yo nephew on Saturday helped improve her mood, even though she was not expecting to take care of him that day.  Diagnosis:  Persistent depressive disorder  Attention deficit hyperactivity disorder (ADHD), unspecified ADHD type   Goals, Assessment & Plan:   Samantha Romero  continues to work on changing unhelpful thoughts and core beliefs which affects her mood & behaviors.   Frequency: Weekly  Modality: In Person    Goal:  Implement assertive communication by utilizing cognitive coping strategies to increase her self-confidence as evidenced by self-report  - Samantha Romero was able to complete a plan on how she can communicate her needs to her close friends & family.   - She continues to practice assertive communication skills with people in her life  Target Date: 03/28/2024  Progress: Ongoing      Goals: Increase insight on how traumatic experience has affected her daily functioning as evidenced by self-report.  - Samantha Romero shared she still has the nightmares.  She is working on strengthening her physical and mental health to be able to process her experiences.   Target Date: 03/29/2024  Progress: Ongoing    Samantha Romero P. Trudy, MSW, LCSW [employer] Therapist Main Office: [phone]

## 2024-01-17 ENCOUNTER — Ambulatory Visit (INDEPENDENT_AMBULATORY_CARE_PROVIDER_SITE_OTHER): Admitting: Sports Medicine

## 2024-01-17 ENCOUNTER — Ambulatory Visit: Admitting: Orthopaedic Surgery

## 2024-01-17 ENCOUNTER — Encounter: Payer: Self-pay | Admitting: Sports Medicine

## 2024-01-17 ENCOUNTER — Other Ambulatory Visit: Payer: Self-pay

## 2024-01-17 DIAGNOSIS — Z6841 Body Mass Index (BMI) 40.0 and over, adult: Secondary | ICD-10-CM | POA: Diagnosis not present

## 2024-01-17 DIAGNOSIS — M25511 Pain in right shoulder: Secondary | ICD-10-CM | POA: Diagnosis not present

## 2024-01-17 DIAGNOSIS — M25552 Pain in left hip: Secondary | ICD-10-CM

## 2024-01-17 MED ORDER — LIDOCAINE HCL 1 % IJ SOLN
2.0000 mL | INTRAMUSCULAR | Status: AC | PRN
  Administered 2024-01-17: 2 mL

## 2024-01-17 MED ORDER — BUPIVACAINE HCL 0.25 % IJ SOLN
2.0000 mL | INTRAMUSCULAR | Status: AC | PRN
  Administered 2024-01-17: 2 mL via INTRA_ARTICULAR

## 2024-01-17 MED ORDER — METHYLPREDNISOLONE ACETATE 40 MG/ML IJ SUSP
80.0000 mg | INTRAMUSCULAR | Status: AC | PRN
  Administered 2024-01-17: 80 mg via INTRA_ARTICULAR

## 2024-01-17 NOTE — Progress Notes (Signed)
 Office Visit Note   Patient: Samantha Romero           Date of Birth: 07/08/1975           MRN: [id number] Visit Date: 01/17/2024              Requested by: No referring provider defined for this encounter. PCP: Jason Leita Repine, FNP (Inactive)   Assessment & Plan: Visit Diagnoses:  1. Pain in left hip   2. Acute pain of right shoulder   3. Body mass index 50.0-59.9, adult (HCC)     Plan: History of Present Illness Samantha Romero is a 48 year old female who presents with right shoulder and left hip pain affecting her daily activities.  She experiences significant shoulder and hip pain that interferes with daily activities and sleep. Sleeping in a recliner is necessary as lying down worsens the shoulder pain. The pain is significant, with occasional 'little air pocket type pops' during use of a back massager, which provides temporary relief.  She has not started physical therapy but plans to begin next week. She manages the pain with heat, leg lifts, and alternating Advil and Tylenol . A shoulder support provided some relief but was difficult to secure. Prednisone  was effective in the past. She is concerned about managing daily activities, such as driving and taking out the trash, as she lives alone and uses one arm.  Examination of the right shoulder and left hip are unchanged from prior visit.  Assessment and Plan Chronic right shoulder pain Chronic pain likely due to rotator cuff wear and inflammation. Previous prednisone  was temporarily effective. Avoid brace to prevent frozen shoulder. - Administer cortisone injection for inflammation. - Initiate physical therapy. - Avoid shoulder brace.  Left hip pain - Initiate physical therapy  Obesity Obesity exacerbates musculoskeletal pain. Weight loss is crucial to reduce joint stress and improve health. - Encourage weight loss through diet and exercise.  Follow-up Discussed follow-up to monitor treatment response and adjust  as needed. - Schedule follow-up after physical therapy to assess progress.  Follow-Up Instructions: No follow-ups on file.   Orders:  No orders of the defined types were placed in this encounter.  No orders of the defined types were placed in this encounter.    Subjective: Chief Complaint  Patient presents with   Right Shoulder - Pain    HPI  Review of Systems  Constitutional: Negative.   HENT: Negative.    Eyes: Negative.   Respiratory: Negative.    Cardiovascular: Negative.   Endocrine: Negative.   Musculoskeletal: Negative.   Neurological: Negative.   Hematological: Negative.   Psychiatric/Behavioral: Negative.    All other systems reviewed and are negative.    Objective: Vital Signs: There were no vitals taken for this visit.  Physical Exam Vitals and nursing note reviewed.  Constitutional:      Appearance: She is well-developed.  HENT:     Head: Atraumatic.     Nose: Nose normal.  Eyes:     Extraocular Movements: Extraocular movements intact.  Cardiovascular:     Pulses: Normal pulses.  Pulmonary:     Effort: Pulmonary effort is normal.  Abdominal:     Palpations: Abdomen is soft.  Musculoskeletal:     Cervical back: Neck supple.  Skin:    General: Skin is warm.     Capillary Refill: Capillary refill takes less than 2 seconds.  Neurological:     Mental Status: She is alert. Mental status is at  baseline.  Psychiatric:        Behavior: Behavior normal.        Thought Content: Thought content normal.        Judgment: Judgment normal.     PMFS History: Patient Active Problem List   Diagnosis Date Noted   HFrEF (heart failure with reduced ejection fraction) (HCC) 12/29/2022   OSA on CPAP 12/29/2022   Morbid obesity (HCC) 12/29/2022   Mixed anxiety and depressive disorder 07/12/2022   Left hip pain 09/30/2020   Environmental and seasonal allergies 07/24/2019   Asthma 10/06/2018   Hypertensive urgency 10/06/2018   GERD (gastroesophageal  reflux disease) 10/06/2018   Depression with anxiety 10/06/2018   CHF (congestive heart failure), NYHA class II, acute, systolic (HCC) 10/06/2018   Shortness of breath    Cardiomegaly    Snoring 10/04/2018   Essential hypertension 08/14/2018   Moderate persistent asthma with acute exacerbation 08/14/2018   Fibromyalgia 11/18/2016   Class 3 severe obesity due to excess calories without serious comorbidity with body mass index (BMI) of 45.0 to 49.9 in adult (HCC) 11/18/2016   Adjustment reaction with anxiety and depression 11/18/2016   Chronic bilateral low back pain without sciatica 09/22/2015   Myofascial pain 09/22/2015   Lapband port leaking-REVISED May 2013 09/10/2011   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 07/21/2011   Past Medical History:  Diagnosis Date   Anxiety    panic attacks   Asthma    CHF (congestive heart failure), NYHA class II, acute, systolic (HCC) 10/06/2018   Depression    Fibromyalgia    Hypertension    no meds currently   No pertinent past medical history    Pneumonia    hx of    Sinus problem     Family History  Problem Relation Age of Onset   Ulcers Mother    Hypertension Mother    Cancer Maternal Uncle        lung   Cancer Maternal Grandfather        lung and back    Past Surgical History:  Procedure Laterality Date   ESOPHAGOGASTRODUODENOSCOPY N/A 11/26/2014   Procedure: ESOPHAGOGASTRODUODENOSCOPY (EGD);  Surgeon: Alm Angle, MD;  Location: [facility];  Service: General;  Laterality: N/A;   GASTRIC BANDING PORT REVISION  08/24/11   LAPAROSCOPIC GASTRIC BANDING  12/26/2007   LAPAROSCOPIC LYSIS OF ADHESIONS N/A 01/06/2015   Procedure: LAPAROSCOPIC LYSIS OF ADHESIONS;  Surgeon: Donnice Lunger, MD;  Location: [facility];  Service: General;  Laterality: N/A;   LAPAROSCOPIC REPAIR AND REMOVAL OF GASTRIC BAND N/A 01/06/2015   Procedure: LAPAROSCOPIC RESIGHTING OF GASTRIC Band PORT;  Surgeon: Donnice Lunger, MD;  Location: [facility];  Service: General;  Laterality: N/A;    NO PAST SURGERIES     Social History   Occupational History   Not on file  Tobacco Use   Smoking status: Never   Smokeless tobacco: Never  Vaping Use   Vaping status: Never Used  Substance and Sexual Activity   Alcohol use: Never   Drug use: Never   Sexual activity: Not Currently    Birth control/protection: I.U.D.    Comment: Mirena

## 2024-01-17 NOTE — Progress Notes (Signed)
   Procedure Note  Patient: Samantha Romero             Date of Birth: 1975/11/26           MRN: [id number]             Visit Date: 01/17/2024  Procedures: Visit Diagnoses:  1. Acute pain of right shoulder    Large Joint Inj: R glenohumeral on 01/17/2024 11:37 AM Indications: pain Details: 22 G 3.5 in needle, ultrasound-guided posterior approach Medications: 2 mL lidocaine  1 %; 2 mL bupivacaine  0.25 %; 80 mg methylPREDNISolone  acetate 40 MG/ML Outcome: tolerated well, no immediate complications  US -guided glenohumeral joint injection, Right shoulder After discussion on risks/benefits/indications, informed verbal consent was obtained. A timeout was then performed. The patient was positioned lying lateral recumbent on examination table. The patient's shoulder was prepped with betadine and multiple alcohol swabs and utilizing ultrasound guidance, the patient's glenohumeral joint was identified on ultrasound. Using ultrasound guidance a 22-gauge, 3.5 inch needle with a mixture of 2:2:2 cc's lidocaine :bupivicaine:depomedrol was directed from a lateral to medial direction via in-plane technique into the glenohumeral joint with visualization of appropriate spread of injectate into the joint. Patient tolerated the procedure well without immediate complications.      Procedure, treatment alternatives, risks and benefits explained, specific risks discussed. Consent was given by the patient. Immediately prior to procedure a time out was called to verify the correct patient, procedure, equipment, support staff and site/side marked as required. Patient was prepped and draped in the usual sterile fashion.     - patient tolerated procedure well, discussed post-injection protocol - discussed evaluating hip at later time with consideration of injection if needed - follow-up with Dr. Jerri as indicated; I am happy to see them as needed  Lonell Sprang, DO Primary Care Sports Medicine Physician  [facility]  This note was dictated using Dragon naturally speaking software and may contain errors in syntax, spelling, or content which have not been identified prior to signing this note.

## 2024-01-19 ENCOUNTER — Encounter (HOSPITAL_BASED_OUTPATIENT_CLINIC_OR_DEPARTMENT_OTHER): Payer: Self-pay | Admitting: Physical Therapy

## 2024-01-19 NOTE — Telephone Encounter (Signed)
 I called the patient and she said she goes to physical therapy tomorrow and she will ask them to weigh her and document it and then she will send you a mychart back when that happens

## 2024-01-20 ENCOUNTER — Ambulatory Visit (HOSPITAL_BASED_OUTPATIENT_CLINIC_OR_DEPARTMENT_OTHER): Attending: Orthopaedic Surgery | Admitting: Physical Therapy

## 2024-01-20 ENCOUNTER — Encounter (HOSPITAL_BASED_OUTPATIENT_CLINIC_OR_DEPARTMENT_OTHER): Payer: Self-pay

## 2024-01-23 ENCOUNTER — Other Ambulatory Visit (HOSPITAL_BASED_OUTPATIENT_CLINIC_OR_DEPARTMENT_OTHER): Payer: Self-pay

## 2024-01-23 ENCOUNTER — Ambulatory Visit (INDEPENDENT_AMBULATORY_CARE_PROVIDER_SITE_OTHER): Admitting: Clinical

## 2024-01-23 DIAGNOSIS — F341 Dysthymic disorder: Secondary | ICD-10-CM | POA: Diagnosis not present
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
## 2024-01-29 NOTE — Progress Notes (Unsigned)
 [facility] Counselor/Therapist Progress Note - IN-PERSON  Patient ID: Samantha Romero, MRN: [id number]    Date: ***  Time Spent: ***   - ***  : *** Minutes  Types of Service: {CHL AMB TYPE OF SERVICE:[id number]}  Presenting Concerns:  ***  Mental Status Exam: Appearance:  {PSY:22683}     Behavior: {PSY:21022743}  Motor: {PSY:22302}  Speech/Language:  {PSY:22685}  Affect: {PSY:22687}  Mood: {PSY:31886}  Thought process: {PSY:31888}  Thought content:   {[phone]}  Sensory/Perceptual disturbances:   {PSY:604-183-6475}  Orientation: {PSY:30297}  Attention: {PSY:22877}  Concentration: {PSY:418-286-4624}  Memory: {[phone]}  Fund of knowledge:  {PSY:418-286-4624}  Insight:   {PSY:418-286-4624}  Judgment:  {PSY:418-286-4624}  Impulse Control: {PSY:418-286-4624}   Risk Assessment: Danger to Self:  {PSY:22692} Self-injurious Behavior: {PSY:22692} Danger to Others: {PSY:22692} Duty to Warn:{PSY:311194}   Subjective:  ***   Interventions: {PSY:5191179615}  Client Response: ***   Diagnosis:  No diagnosis found.   Goals, Assessment & Plan:   *** Frequency: ***  Modality: ***    Goal: ***  Target Date: ***  Progress: ***     Goal: ***  Target Date: ***  Progress: ***    Carolos Fecher P. Trudy, MSW, LCSW [employer] Therapist Main Office: [phone]

## 2024-01-30 ENCOUNTER — Other Ambulatory Visit: Payer: Self-pay | Admitting: Adult Health

## 2024-01-30 ENCOUNTER — Encounter: Payer: Self-pay | Admitting: Clinical

## 2024-01-30 ENCOUNTER — Encounter: Payer: Self-pay | Admitting: Radiology

## 2024-01-30 ENCOUNTER — Other Ambulatory Visit (HOSPITAL_COMMUNITY): Payer: Self-pay

## 2024-01-30 ENCOUNTER — Ambulatory Visit: Admitting: Clinical

## 2024-01-30 ENCOUNTER — Encounter (HOSPITAL_COMMUNITY): Payer: Self-pay

## 2024-01-30 DIAGNOSIS — F332 Major depressive disorder, recurrent severe without psychotic features: Secondary | ICD-10-CM

## 2024-01-30 DIAGNOSIS — F341 Dysthymic disorder: Secondary | ICD-10-CM | POA: Diagnosis not present

## 2024-01-31 ENCOUNTER — Telehealth: Payer: Self-pay | Admitting: Clinical

## 2024-01-31 ENCOUNTER — Ambulatory Visit: Admitting: Sports Medicine

## 2024-01-31 ENCOUNTER — Other Ambulatory Visit: Payer: Self-pay

## 2024-02-01 ENCOUNTER — Other Ambulatory Visit (HOSPITAL_COMMUNITY): Payer: Self-pay

## 2024-02-01 MED ORDER — BUPROPION HCL ER (XL) 300 MG PO TB24
300.0000 mg | ORAL_TABLET | Freq: Every day | ORAL | 0 refills | Status: AC
  Filled 2024-02-01: qty 90, 90d supply, fill #0

## 2024-02-01 MED ORDER — BUPROPION HCL ER (XL) 150 MG PO TB24
150.0000 mg | ORAL_TABLET | Freq: Every day | ORAL | 0 refills | Status: AC
  Filled 2024-02-01: qty 90, 90d supply, fill #0

## 2024-02-04 NOTE — Telephone Encounter (Signed)
 TC to check in with Samantha Romero.  She reported she was feeling better and with her mother at the time of the call.  She was working on tasks and following up on appointments.  She reported no urgent needs at that time.

## 2024-02-06 ENCOUNTER — Ambulatory Visit: Admitting: Clinical

## 2024-02-06 DIAGNOSIS — F341 Dysthymic disorder: Secondary | ICD-10-CM | POA: Diagnosis not present

## 2024-02-06 NOTE — Progress Notes (Signed)
 [facility] Counselor/Therapist Progress Note - IN-PERSON  Patient ID: Samantha Romero, MRN: [id number]    Date: 02/06/2024  Time Spent: 2:07pm  - 3:00pm  : 53 Minutes  Types of Service: Individual psychotherapy  Presenting Concerns:  Ongoing stressors with health and finances that are affecting her mood  Mental Status Exam: Appearance:  Casual     Behavior: Appropriate  Motor: Normal  Speech/Language:  Normal Rate  Affect: Appropriate  Mood: depressed  Thought process: tangential  Thought content:   WNL  Sensory/Perceptual disturbances:   WNL  Orientation: oriented to person, place, time/date, situation, and day of week  Attention: Good  Concentration: Fair  Memory: WNL  Fund of knowledge:  Good  Insight:   Good  Judgment:  Good  Impulse Control: Good   Risk Assessment: Danger to Self:  No Self-injurious Behavior: No Danger to Others: No Duty to Warn:no   Subjective:  Samantha Romero reported ongoing stressors with limited finances and health that is making her feel overwhelmed and had thoughts of being better off dead.  However, this past weekend, she re-connected with family member and close friend that has given her additional reasons to live & keep going with life.  Interventions: Mindfulness Meditation and Assessed for SI, identified strengths & current support systems. Reviewed resources and strategies that she can implement to ensure her safety.  Client Response: Samantha Romero acknowledged that the chronic pain and financial stressors has led her to a depressive state and thoughts of being better off dead.  However, over the weekend, she came to realize that her nephew and a close friend of hers needs her and is giving her a reason to live.  Her close friend is moving to [place] in about 2 weeks, which she reported will be a strong support for her as well.  Samantha Romero was open to practicing mindfulness and being outside more in the daylight to help with her  mood.   Diagnosis:  Persistent depressive disorder   Goals, Assessment & Plan:   Continue with individual psycho therapy to learn additional strategies to implement to improve her mood.    Frequency: Weekly  Modality: In-Person or Telemedicine     Goal:  Implement assertive communication by utilizing cognitive coping strategies to increase her self-confidence as evidenced by self-report  - [name] has reached out to various people and shared with them her current state of mind.  She reported people have been supportive and available to her.   Target Date: 03/28/2024  Progress: Ongoing       Tahmid Stonehocker P. Trudy, MSW, LCSW [employer] Therapist Main Office: [phone]

## 2024-02-09 ENCOUNTER — Ambulatory Visit: Admitting: Sports Medicine

## 2024-02-13 ENCOUNTER — Ambulatory Visit: Admitting: Clinical

## 2024-02-13 ENCOUNTER — Encounter: Payer: Self-pay | Admitting: Clinical

## 2024-02-20 ENCOUNTER — Ambulatory Visit: Admitting: Clinical

## 2024-02-20 DIAGNOSIS — F341 Dysthymic disorder: Secondary | ICD-10-CM | POA: Diagnosis not present

## 2024-02-20 NOTE — Progress Notes (Unsigned)
                Samantha Romero SHAUNNA Pouch, LCSW

## 2024-02-24 ENCOUNTER — Other Ambulatory Visit (HOSPITAL_BASED_OUTPATIENT_CLINIC_OR_DEPARTMENT_OTHER): Payer: Self-pay

## 2024-02-27 ENCOUNTER — Ambulatory Visit: Admitting: Clinical

## 2024-02-27 ENCOUNTER — Encounter: Payer: Self-pay | Admitting: Clinical

## 2024-02-27 ENCOUNTER — Telehealth: Payer: Self-pay | Admitting: Clinical

## 2024-02-27 NOTE — Telephone Encounter (Signed)
 TC to Ms. Lynwood regarding appointment, no answer. This Clinician left a message to call back with name & contact information.

## 2024-02-27 NOTE — Progress Notes (Unsigned)
 [facility] Counselor/Therapist Progress Note - IN-PERSON  Patient ID: Samantha Romero, MRN: [id number]    Date: ***  Time Spent: ***   - ***  : *** Minutes  Types of Service: {CHL AMB TYPE OF SERVICE:802-343-7816}  Presenting Concerns:  ***  Mental Status Exam: Appearance:  {PSY:22683}     Behavior: {PSY:21022743}  Motor: {PSY:22302}  Speech/Language:  {PSY:22685}  Affect: {PSY:22687}  Mood: {PSY:31886}  Thought process: {PSY:31888}  Thought content:   {PSY:907-142-9453}  Sensory/Perceptual disturbances:   {PSY:(603)755-3963}  Orientation: {PSY:30297}  Attention: {PSY:22877}  Concentration: {PSY:805-144-1130}  Memory: {PSY:581-130-8498}  Fund of knowledge:  {PSY:805-144-1130}  Insight:   {PSY:805-144-1130}  Judgment:  {PSY:805-144-1130}  Impulse Control: {PSY:805-144-1130}   Risk Assessment: Danger to Self:  {PSY:22692} Self-injurious Behavior: {PSY:22692} Danger to Others: {PSY:22692} Duty to Warn:{PSY:311194}   Subjective:  ***   Interventions: {PSY:403-721-7227}  Client Response: ***   Diagnosis:  No diagnosis found.   Goals, Assessment & Plan:   *** Frequency: ***  Modality: ***    Goal: ***  Target Date: ***  Progress: ***     Goal: ***  Target Date: ***  Progress: ***    Crandall Harvel P. Trudy, MSW, LCSW [employer] Therapist Main Office: [phone]

## 2024-03-01 ENCOUNTER — Ambulatory Visit: Admitting: Clinical

## 2024-03-01 DIAGNOSIS — F341 Dysthymic disorder: Secondary | ICD-10-CM
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
## 2024-03-05 ENCOUNTER — Encounter: Admitting: Nurse Practitioner

## 2024-03-05 ENCOUNTER — Ambulatory Visit: Admitting: Clinical

## 2024-03-05 DIAGNOSIS — F341 Dysthymic disorder: Secondary | ICD-10-CM

## 2024-03-05 DIAGNOSIS — F411 Generalized anxiety disorder: Secondary | ICD-10-CM

## 2024-03-05 DIAGNOSIS — F909 Attention-deficit hyperactivity disorder, unspecified type: Secondary | ICD-10-CM

## 2024-03-05 NOTE — Progress Notes (Unsigned)
 [facility] Counselor Progress Note - TELEMEDICINE VISIT  Patient ID: MARCEDES TECH, MRN: [id number]    Date: 03/05/2025  Time Spent: 2:00pm-2:55pm 55 min  Types of Service: Individual psychotherapy and Video visit  Client and/or Legal Guardian location: Client's home - [place], [place] Therapist location: [facility] Office - [place], [place] All persons participating in visit: Client & this therapist  I connected with client and/or legal guardian via Video Enabled Telemedicine Application  (Video is Caregility application) and verified that I am speaking with the correct person using two identifiers. Discussed confidentiality: Yes   I discussed the limitations of telemedicine and the availability of in person appointments.  Discussed there is a possibility of technology failure and discussed alternative modes of communication if that failure occurs.  I discussed that engaging in this telemedicine visit, they consent to the provision of behavioral healthcare and the services will be billed under their insurance.  Client and/or legal guardian expressed understanding and consented to Telemedicine visit: Yes    Presenting Concerns:  - Ongoing nightmares at night that is affecting her sleep and daily functioning - Ongoing stress with limited finances  Mental Status Exam: Appearance:  Casual     Behavior: Appropriate  Motor: Normal  Speech/Language:  Normal Rate  Affect: Appropriate  Mood: anxious  Thought process: normal  Thought content:   WNL  Sensory/Perceptual disturbances:   WNL  Orientation: oriented to person, place, time/date, situation, and day of week  Attention: Good  Concentration: Good  Memory: WNL  Fund of knowledge:  Good  Insight:   Good  Judgment:  Good  Impulse Control: Fair   Risk Assessment: Danger to Self:  No Self-injurious Behavior: No Danger to Others: No Duty to Warn:no   Subjective:  Josalynn reported a significant decrease in  energy level, decreased appetite and need to withdraw from others.  Interventions: Cognitive Behavioral Therapy and Reviewed coping strategies, utilization of support system and strengths to manage what she's experiencing. Developed plan to improve sleep hygiene and quality of sleep.  Client Response: Mintie reported that she was able to enjoy time with family yesterday to celebrate her birthday.  She reported she was able to spend one on one time with each family member, but it drained her energy, which typically does not happen for her.  Oona continues to have nightmares 3 nights a week related to traumatic events in the past. She avoids going to sleep, therefore her bedtime is not consistent, her sleep is disturbed by the nightmares, which then affects her energy level.  Junelle was open to developing a plan to improve her sleep, increase her physical activities and nutritional intake.  Cathline identified that having a bedtime routine & improving her sleep environment would help her. - She identified 11pm as her goal for bedtime - She stated that it's visually important for her to have a white (clean) bed, having shams, fluffy white comforter, special pillow cases for her hair, diffuser, relaxing music from a speaker instead of the phone. Since she has most of those things already, she will work on implementing these things by Tuesday night.  If gym membership is still active, she wants to go and focus on doing the stationary bike for about 45 min since she knows it helped her when she was doing it earlier this year.  For nutrition, she will talk to her friend about healthy meal options.  Diagnosis:  Persistent depressive disorder  GAD (generalized anxiety disorder)  Attention deficit  hyperactivity disorder (ADHD), unspecified ADHD type   Goals, Assessment & Plan:   Rakesha's nightmares and sleep avoidance are contributing to impaired daily functioning.  Symptoms may be consistent with  post-traumatic stress.  Krisinda may benefit from further assessment of traumatic stress symptoms and she was agreeable to completing it at next appointment.  Casara may also benefit from talking to her psychiatric provider regarding medication management for her symptoms and nightmares.  Treatment Level Weekly  Modality - TeleMedicine - Video  Goal:  Implement assertive communication by utilizing cognitive coping strategies to increase her self-confidence as evidenced by self-report   - Ana continues to work on communicating her needs with her friends & family. - Jatia reported she feels less anxious about a meeting she's going to have with a previous co-worker and more confident with being able to express what she wants from that meeting.   Target Date: 03/28/2024  Progress: Ongoing     Goals:  Develop healthy thinking and beliefs about self, others, and the world to alleviate depressive symptoms.  Objectives: Target Date For All Objectives: 08/27/2024  Identify and replace thoughts and beliefs that support depression.   Increasingly verbalize hopeful and positive statements regarding self, others, and the future. - Aerilyn was able to verbalize the positive experiences she had with her family yesterday. - She reported feeling more positive about improving her sleep after developing a plan to make her bedroom more comfortable for her.    Joziyah Roblero P. Trudy, MSW, LCSW [employer] Therapist Main Office: [phone]

## 2024-03-09 ENCOUNTER — Encounter: Admitting: Nurse Practitioner

## 2024-03-12 ENCOUNTER — Ambulatory Visit: Admitting: Clinical

## 2024-03-12 DIAGNOSIS — F4310 Post-traumatic stress disorder, unspecified: Secondary | ICD-10-CM

## 2024-03-12 DIAGNOSIS — F341 Dysthymic disorder: Secondary | ICD-10-CM
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
## 2024-03-13 ENCOUNTER — Encounter (HOSPITAL_BASED_OUTPATIENT_CLINIC_OR_DEPARTMENT_OTHER): Payer: Self-pay | Admitting: Emergency Medicine

## 2024-03-13 ENCOUNTER — Emergency Department (HOSPITAL_BASED_OUTPATIENT_CLINIC_OR_DEPARTMENT_OTHER)
Admission: EM | Admit: 2024-03-13 | Discharge: 2024-03-13 | Disposition: A | Discharge status: Home / Self Care | Attending: Emergency Medicine | Admitting: Emergency Medicine

## 2024-03-13 ENCOUNTER — Other Ambulatory Visit: Payer: Self-pay

## 2024-03-13 DIAGNOSIS — R059 Cough, unspecified: Secondary | ICD-10-CM | POA: Diagnosis not present

## 2024-03-13 DIAGNOSIS — J069 Acute upper respiratory infection, unspecified: Secondary | ICD-10-CM | POA: Diagnosis not present

## 2024-03-13 DIAGNOSIS — J029 Acute pharyngitis, unspecified: Secondary | ICD-10-CM | POA: Diagnosis present

## 2024-03-13 LAB — RESP PANEL BY RT-PCR (RSV, FLU A&B, COVID)  RVPGX2
Influenza A by PCR: NEGATIVE
Influenza B by PCR: NEGATIVE
Resp Syncytial Virus by PCR: NEGATIVE
SARS Coronavirus 2 by RT PCR: NEGATIVE

## 2024-03-13 LAB — GROUP A STREP BY PCR: Group A Strep by PCR: NOT DETECTED

## 2024-03-13 MED ORDER — DEXAMETHASONE 4 MG PO TABS
10.0000 mg | ORAL_TABLET | Freq: Once | ORAL | Status: AC
  Administered 2024-03-13: 12:00:00 10 mg via ORAL
  Filled 2024-03-13: qty 3

## 2024-03-13 NOTE — ED Triage Notes (Signed)
 Sore throat and right ear pain started this morning . No cough

## 2024-03-13 NOTE — Discharge Instructions (Signed)
Take tylenol 2 pills 4 times a day and motrin 4 pills 3 times a day.  Drink plenty of fluids.  Return for worsening shortness of breath, headache, confusion. Follow up with your family doctor.   You can try Sudafed, the medicine that you have to show your driver's license and sign your name.  This works very well but will raise your blood pressure and make your heart race.  You can also try Afrin which can have similar side effects but works right at the source and so may have less heart racing.  If you use Afrin, it can become addictive, so it is only recommended for 3 days.  

## 2024-03-13 NOTE — ED Notes (Signed)
 Discharge instructions reviewed with patient. Patient verbalizes understanding, no further questions at this time. Medications and follow up information provided. No acute distress noted at time of departure.

## 2024-03-13 NOTE — ED Provider Notes (Signed)
 [facility] Provider Note   CSN: [id number] Arrival date & time: 03/13/24  1009     Patient presents with: Otalgia (right) and Sore Throat   Samantha Romero is a 48 y.o. female.   48 yo F with a cc of sore throat.  Going on for couple days.  She has chronic postnasal drip and takes nasal steroids.  She has been feeling like there is something stuck in the back of her throat at times and it makes her gag sometimes.  No vomiting.  No injury.  No fevers.   Otalgia Sore Throat       Prior to Admission medications  Medication Sig Start Date End Date Taking? Authorizing Provider  albuterol  (PROVENTIL ) (2.5 MG/3ML) 0.083% nebulizer solution Take 3 mLs (2.5 mg total) by nebulization every 6 (six) hours as needed for wheezing or shortness of breath. 03/24/22   Georgina Speaks, FNP  albuterol  (VENTOLIN  HFA) 108 (90 Base) MCG/ACT inhaler Inhale 2 puffs into the lungs every 6 (six) hours as needed for wheezing or shortness of breath. 03/24/22   Moore, Janece, FNP  ALPRAZolam  (XANAX ) 1 MG tablet Take 1 tablet (1 mg total) by mouth 2 (two) times daily as needed for anxiety. 12/01/23   Mozingo, Regina Nattalie, NP  azelastine  (ASTELIN ) 0.1 % nasal spray Place 2 sprays into both nostrils 2 (two) times daily. 06/17/23   Jason Leita Repine, FNP  Blood Pressure Monitoring (OMRON 3 SERIES BP MONITOR) DEVI Use daily as directed. 09/20/23   Wyn Jackee VEAR Mickey., NP  buPROPion  (WELLBUTRIN  XL) 150 MG 24 hr tablet Take 1 tablet (150 mg total) by mouth daily. 02/01/24   Mozingo, Regina Nattalie, NP  buPROPion  (WELLBUTRIN  XL) 300 MG 24 hr tablet Take 1 tablet (300 mg total) by mouth daily. 02/01/24   Mozingo, Regina Nattalie, NP  carvedilol  (COREG ) 25 MG tablet Take 1 tablet (25 mg total) by mouth 2 (two) times daily with a meal. 01/04/24   Wyn Jackee VEAR Mickey., NP  DULoxetine  (CYMBALTA ) 30 MG capsule Take 1 capsule (30 mg total) by mouth daily. 12/01/23   Mozingo, Regina  Nattalie, NP  DULoxetine  (CYMBALTA ) 60 MG capsule Take 1 capsule (60 mg total) by mouth daily. 12/01/23   Mozingo, Regina Nattalie, NP  empagliflozin  (JARDIANCE ) 10 MG TABS tablet Take 1 tablet (10 mg total) by mouth every morning before breakfast. 09/20/23   Wyn Jackee VEAR Mickey., NP  furosemide  (LASIX ) 40 MG tablet Take 1 tablet (40 mg total) by mouth daily. You may take 1 extra tablet (40 mg total) by mouth daily as needed for lower extremity swelling/weight gain. 09/20/23   Wyn Jackee VEAR Mickey., NP  glycopyrrolate  (ROBINUL ) 1 MG tablet Take 1-3 tablets (1-3 mg total) by mouth daily as directed. 01/11/24   Sandridge, Brenda K, PA-C  levocetirizine (XYZAL ) 5 MG tablet Take 1 tablet (5 mg total) by mouth every evening. 06/17/23   Jason Leita Repine, FNP  levonorgestrel (MIRENA, 52 MG,) 20 MCG/DAY IUD  03/11/16   [provider]  lisdexamfetamine  (VYVANSE ) 50 MG capsule Take 1 capsule (50 mg total) by mouth daily. 12/29/23   Mozingo, Regina Nattalie, NP  lisdexamfetamine  (VYVANSE ) 50 MG capsule Take 1 capsule (50 mg total) by mouth daily. 12/01/23   Mozingo, Regina Nattalie, NP  methocarbamol  (ROBAXIN ) 750 MG tablet Take 1 tablet (750 mg total) by mouth 2 (two) times daily as needed for muscle spasms. 11/11/23   Jerri Kay HERO, MD  methylPREDNISolone  (MEDROL  DOSEPAK) 4 MG TBPK tablet Take as directed on pack for 6 days. 11/11/23   Jerri Kay HERO, MD  montelukast  (SINGULAIR ) 10 MG tablet TAKE 1 TABLET BY MOUTH EVERYDAY AT BEDTIME 06/17/23   Jason Leita Repine, FNP  omeprazole  (PRILOSEC) 40 MG capsule TAKE 1 CAPSULE BY MOUTH EVERY DAY 06/17/23   Jason Leita Repine, FNP  sacubitril -valsartan  (ENTRESTO ) 97-103 MG Take 1 tablet by mouth 2 (two) times daily. 09/20/23   Wyn Jackee VEAR Mickey., NP  semaglutide -weight management (WEGOVY ) 1 MG/0.5ML SOAJ SQ injection Inject 1 mg into the skin once a week. When you finish the 0.5mg  01/03/24   Shlomo Wilbert SAUNDERS, MD  spironolactone  (ALDACTONE ) 25 MG tablet Take 1 tablet  (25 mg total) by mouth daily. 09/20/23   Wyn Jackee VEAR Mickey., NP  traZODone  (DESYREL ) 50 MG tablet Take 1 tablet (50 mg total) by mouth at bedtime. 12/01/23   Mozingo, Regina Nattalie, NP  mometasone -formoterol  (DULERA) 200-5 MCG/ACT AERO Inhale 2 puffs into the lungs 2 (two) times a day. 10/19/18 12/17/19  Jeneal Danita Macintosh, MD    Allergies: Grass extracts [gramineae pollens] and Shellfish allergy    Review of Systems  HENT:  Positive for ear pain.     Updated Vital Signs BP (!) 165/97 (BP Location: Left Wrist)   Pulse 100   Temp 98.5 F (36.9 C) (Oral)   Resp 20   Wt (!) 142.9 kg   SpO2 97%   BMI 55.80 kg/m   Physical Exam Vitals and nursing note reviewed.  Constitutional:      General: She is not in acute distress.    Appearance: She is well-developed. She is not diaphoretic.  HENT:     Head: Normocephalic and atraumatic.     Mouth/Throat:     Comments: Posterior nasal drip swollen turbinates.  Uvula is midline. Eyes:     Pupils: Pupils are equal, round, and reactive to light.  Cardiovascular:     Rate and Rhythm: Normal rate and regular rhythm.     Heart sounds: No murmur heard.    No friction rub. No gallop.  Pulmonary:     Effort: Pulmonary effort is normal.     Breath sounds: No wheezing or rales.  Abdominal:     General: There is no distension.     Palpations: Abdomen is soft.     Tenderness: There is no abdominal tenderness.  Musculoskeletal:        General: No tenderness.     Cervical back: Normal range of motion and neck supple.  Skin:    General: Skin is warm and dry.  Neurological:     Mental Status: She is alert and oriented to person, place, and time.  Psychiatric:        Behavior: Behavior normal.     (all labs ordered are listed, but only abnormal results are displayed) Labs Reviewed  GROUP A STREP BY PCR  RESP PANEL BY RT-PCR (RSV, FLU A&B, COVID)  [id number]    EKG: None  Radiology: No results found.   Procedures   Medications  Ordered in the ED  dexamethasone  (DECADRON ) tablet 10 mg (has no administration in time range)                                    Medical Decision Making Risk Prescription drug management.   48 yo F with a chief complaints of congestion cough  going on for couple days.  Started to have a sensation like there was congestion stuck in the back of her throat that would make her gag at times.  Has had some nausea off and on.  Exam consistent with a viral syndrome.  No signs of uvulitis.  No suspicion for retropharyngeal abscess or epiglottitis.  Will treat supportively.  PCP follow-up.  11:23 AM:  I have discussed the diagnosis/risks/treatment options with the patient.  Evaluation and diagnostic testing in the emergency department does not suggest an emergent condition requiring admission or immediate intervention beyond what has been performed at this time.  They will follow up with PCP. We also discussed returning to the ED immediately if new or worsening sx occur. We discussed the sx which are most concerning (e.g., sudden worsening pain, fever, inability to tolerate by mouth) that necessitate immediate return. Medications administered to the patient during their visit and any new prescriptions provided to the patient are listed below.  Medications given during this visit Medications  dexamethasone  (DECADRON ) tablet 10 mg (has no administration in time range)     The patient appears reasonably screen and/or stabilized for discharge and I doubt any other medical condition or other [facility] requiring further screening, evaluation, or treatment in the ED at this time prior to discharge.       Final diagnoses:  Viral upper respiratory tract infection    ED Discharge Orders     None          Emil Share, DO 03/13/24 1123

## 2024-03-26 ENCOUNTER — Ambulatory Visit: Admitting: Clinical

## 2024-03-26 NOTE — Progress Notes (Unsigned)
 [facility] Counselor/Therapist Progress Note - IN-PERSON  Patient ID: Samantha Romero, MRN: [id number]    Date: 03/26/24  Time Spent: ***   - ***  : *** Minutes  Types of Service: Individual psychotherapy  Presenting Concerns:  ***  Mental Status Exam: Appearance:  {PSY:22683}     Behavior: {PSY:21022743}  Motor: {PSY:22302}  Speech/Language:  {PSY:22685}  Affect: {PSY:22687}  Mood: {PSY:31886}  Thought process: {PSY:31888}  Thought content:   {PSY:302-778-3325}  Sensory/Perceptual disturbances:   {PSY:940-118-4419}  Orientation: {PSY:30297}  Attention: {PSY:22877}  Concentration: {PSY:251-412-8650}  Memory: {PSY:819-329-7022}  Fund of knowledge:  {PSY:251-412-8650}  Insight:   {PSY:251-412-8650}  Judgment:  {PSY:251-412-8650}  Impulse Control: {PSY:251-412-8650}   Risk Assessment: Danger to Self:  {PSY:22692} Self-injurious Behavior: {PSY:22692} Danger to Others: {PSY:22692} Duty to Warn:{PSY:311194}   Subjective:  ***   Interventions: {PSY:571-447-2448}  Client Response: ***   Diagnosis:  No diagnosis found.   Goals, Assessment & Plan:   *** Frequency: ***  Modality: ***    Goal: ***  Target Date: ***  Progress: ***     Goal: ***  Target Date: ***  Progress: ***    Dylin Ihnen P. Trudy, MSW, LCSW [employer] Therapist Main Office: [phone]

## 2024-03-30 NOTE — Progress Notes (Signed)
 [facility] Counselor/Therapist Progress Note - IN-PERSON  Patient ID: Samantha Romero, MRN: [id number]    Date: 04/01/2024  Time Spent: 2:08pm-3:04pm 56 Minutes  Types of Service: Individual psychotherapy  Presenting Concerns:  Increased anxiety since last visit   Subjective:  Samantha Romero reported she was slightly in pain due to procedures completed on her tooth today, however, she reported wanted to complete the visit  Interventions: Reflective counseling,Mindfulness, Feeling identification and Behavior Activation   Mental Status Exam: Appearance:  Casual     Behavior: Appropriate  Motor: Normal  Speech/Language:  Normal Rate  Affect: Appropriate and Tearful  Mood: anxious and depressed  Thought process: normal  Thought content:   WNL  Sensory/Perceptual disturbances:   WNL  Orientation: oriented to person, place, time/date, situation, and day of week  Attention: Fair  Concentration: Fair and Poor  Memory: WNL  Fund of knowledge:  Good  Insight:   Good  Judgment:  Good  Impulse Control: Good   Risk Assessment: Danger to Self:  No Self-injurious Behavior: No Danger to Others: No Duty to Warn:no  Client's Response   During the visit, Samantha Romero was able to verbalize the physical reactions she was experiencing in that moment, as she was talking about the multiple events in her life.    Samantha Romero was informed about the different treatment options for post traumatic stress symptoms and will continue to discuss her choices since this therapist is not trained for specific ones, eg EMDR. [name] will continue to implement current strategies to decrease stress responses at this time.  Samantha Romero shared her experience from last week's meeting with her previous co-worker and opportunity for employment that brings mixed emotions for her.  Diagnosis:  GAD (generalized anxiety disorder)  Persistent depressive disorder  Post-traumatic stress disorder, unspecified   Goals,  Assessment & Plan:    Frequency: Weekly  Modality: In-Person or Telemedicine   Goal:  Implement assertive communication by utilizing cognitive coping strategies to increase her self-confidence as evidenced by self-report  - Samantha Romero shared 3 significant situations that she felt confident and was able to speak her mind as well as get through the situation. - Samantha Romero stated she used positive self-talk to decrease her anxiety when during those situations.   Target Date: 08/27/2024  Progress: Ongoing      Goals:   Develop healthy thinking and beliefs about self, others, and the world to alleviate depressive symptoms.   Objectives:    Identify and replace thoughts and beliefs that support depression.  - Samantha Romero was able to share she's been working on positive self-talk to replace the unhelpful thoughts she thinks about herself.  Increasingly verbalize hopeful and positive statements regarding self, others, and the future. - Samantha Romero shared she utilized positive self talk and remembering her power in those specific situations.  Improve emotional regulation by utilizing coping skills to manage distress during sessions and at home, breathing, mindfulness, etc.   Enhance Sleep Quality by implementing a sleep hygiene routine and changing bedroom environments as evidenced by consistent bedtime in the next 4-6 weeks. - Samantha Romero reported ongoing difficulties with sleep, which can affect her ability to manage various stressors.   Target Date: 08/27/2024  Progress: Ongoing     Samantha Romero, MSW, LCSW [employer] Therapist Main Office: [phone]

## 2024-04-02 ENCOUNTER — Ambulatory Visit: Admitting: Clinical

## 2024-04-02 DIAGNOSIS — F4310 Post-traumatic stress disorder, unspecified: Secondary | ICD-10-CM

## 2024-04-02 DIAGNOSIS — F411 Generalized anxiety disorder: Secondary | ICD-10-CM | POA: Diagnosis not present

## 2024-04-02 DIAGNOSIS — F341 Dysthymic disorder: Secondary | ICD-10-CM | POA: Diagnosis not present

## 2024-04-09 ENCOUNTER — Encounter: Payer: Self-pay | Admitting: Emergency Medicine

## 2024-04-09 ENCOUNTER — Other Ambulatory Visit (HOSPITAL_BASED_OUTPATIENT_CLINIC_OR_DEPARTMENT_OTHER): Payer: Self-pay

## 2024-04-09 ENCOUNTER — Ambulatory Visit: Payer: Self-pay | Admitting: Nurse Practitioner

## 2024-04-09 ENCOUNTER — Telehealth: Payer: Self-pay | Admitting: Emergency Medicine

## 2024-04-09 ENCOUNTER — Ambulatory Visit: Admitting: Radiology

## 2024-04-09 ENCOUNTER — Ambulatory Visit: Admitting: Clinical

## 2024-04-09 ENCOUNTER — Ambulatory Visit
Admission: EM | Admit: 2024-04-09 | Discharge: 2024-04-09 | Disposition: A | Discharge status: Home / Self Care | Attending: Nurse Practitioner | Admitting: Nurse Practitioner

## 2024-04-09 DIAGNOSIS — S99911A Unspecified injury of right ankle, initial encounter: Secondary | ICD-10-CM

## 2024-04-09 DIAGNOSIS — F4310 Post-traumatic stress disorder, unspecified: Secondary | ICD-10-CM

## 2024-04-09 DIAGNOSIS — F411 Generalized anxiety disorder: Secondary | ICD-10-CM | POA: Diagnosis not present

## 2024-04-09 DIAGNOSIS — S93401A Sprain of unspecified ligament of right ankle, initial encounter: Secondary | ICD-10-CM

## 2024-04-09 DIAGNOSIS — F341 Dysthymic disorder: Secondary | ICD-10-CM

## 2024-04-09 MED ORDER — IBUPROFEN 800 MG PO TABS: 800.0000 mg | ORAL_TABLET | Freq: Three times a day (TID) | ORAL | 0 refills | Status: AC | PRN

## 2024-04-09 MED ORDER — IBUPROFEN 800 MG PO TABS
800.0000 mg | ORAL_TABLET | Freq: Three times a day (TID) | ORAL | 0 refills | Status: DC | PRN
  Filled 2024-04-09: qty 21, 7d supply, fill #0

## 2024-04-09 NOTE — Progress Notes (Signed)
 Radiology review of the right ankle x-ray demonstrates a possible acute avulsion fracture of the distal medial malleolus. Mild diffuse soft tissue edema is also noted. Conservative management remains recommended, including protection of the ankle, rest, ice, compression, elevation (RICE), and limited activity. Follow-up with orthopedics advised for further evaluation and management.

## 2024-04-09 NOTE — Progress Notes (Signed)
 [facility] Counselor/Therapist Progress Note - IN-PERSON  Patient ID: Samantha Romero, MRN: [id number]    Date: 04/09/2024  Time Spent: 2pm   - 3:05pm  : 65 Minutes  Types of Service: Individual psychotherapy  Presenting Concerns:  Increased anxiety in different situations this past week  Mental Status Exam: Appearance:  Casual     Behavior: Appropriate  Motor: Normal  Speech/Language:  Normal Rate  Affect: Appropriate  Mood: anxious  Thought process: normal  Thought content:   WNL  Sensory/Perceptual disturbances:   WNL  Orientation: oriented to person, place, time/date, situation, and day of week  Attention: Good  Concentration: Good and Fair  Memory: WNL  Fund of knowledge:  Good  Insight:   Good  Judgment:  Good  Impulse Control: Good   Risk Assessment: Danger to Self:  No Self-injurious Behavior: No Danger to Others: No Duty to Warn:no   Subjective:  Yarel reported various situations this past week that made her feel anxious and she's also trying to implement strategies to help her through those situations   Interventions: Cognitive Behavioral Therapy - Identified various ways she's challenged thoughts that made her feel anxious and activities to increase pleasant activities  Client Response: Crystalyn shared different situations she experienced this past week that made her feel anxious and depressed.  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  Klee was able to verbalize strategies that she implemented to challenge the anxious and depressive thoughts.  She intentionally went outside in the rain to have a different experience with rain. She practice mindfulness exercises outside to keep herself grounded & present in the activity of being in the rain, which helped  managed the stress responses from past experiences.   Diagnosis:  GAD (generalized anxiety disorder)  Persistent depressive disorder  Post-traumatic stress disorder, unspecified   Goals, Assessment & Plan:   Frequency: Weekly  Modality: In-Person or Telemedicine    Goal:  Implement assertive communication by utilizing cognitive coping strategies to increase her self-confidence as evidenced by self-report  - Trissa shared that she was able to communicate her thoughts & feelings to two people this week that initially were not available to support how she felt after the situation with her storage unit.  Instead of internalizing and being closed off to others after her first attempts, she was intentional in attempting another time to communicate her thoughts & feelings, which was different for her.   Target Date: 08/27/2024  Progress: Ongoing      Goals:   Develop healthy thinking and beliefs about self, others, and the world to alleviate depressive symptoms.   Objectives:    Identify and replace thoughts and beliefs that support depression.  - Declyn was able to challenge unhelpful thoughts in 2 different social situations this past week, which led to connecting with friends and people she wanted to see, eg one of her favorite female artists Kathye Bobbetta Raddle.).   Increasingly verbalize hopeful and positive statements regarding self, others, and the future. 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   Improve emotional regulation by utilizing coping skills to manage distress during sessions and at home, breathing, mindfulness, etc. - Practiced mindfulness exercises outside in the rain  this past weekend.   Enhance Sleep Quality by implementing a sleep hygiene routine and changing bedroom environments as evidenced by consistent bedtime in the next 4-6 weeks. - [place] reported ongoing  difficulties with sleep.  Although she is sleeping earlier, 7pm/8pm, she wakes up at night.  And sometimes it's physically more comfortable for her back to be in the reclining chair.  She has not changed her bedroom environment, however, she is aware that sleep is important to her overall health.     Target Date: 08/27/2024  Progress: Ongoing     Lonita Debes P. Trudy, MSW, LCSW [employer] Therapist Main Office: [phone]

## 2024-04-09 NOTE — ED Provider Notes (Signed)
 " [facility]    CSN: [id number] Arrival date & time: 04/09/24  1523      History   Chief Complaint Chief Complaint  Patient presents with   Ankle Pain    HPI Samantha Romero is a 49 y.o. female.   Discussed the use of AI scribe software for clinical note transcription with the patient, who gave verbal consent to proceed.   Samantha Romero presents with ankle pain following an injury that occurred while carrying boxes yesterday. She was stepping down off a curb when she stepped the wrong way and twisted her ankle, though she is unsure of the direction of the twist. Initially she was able to walk forward without difficulty, but upon sitting down and removing her shoes while circling her ankle, she began experiencing pain. Despite having a high pain tolerance, she attempted conservative management with ice, elevation, bandaging, and ibuprofen , but these measures provided no relief. The pain is localized to the lateral aspect of the ankle with some discomfort on the underside, and is specifically triggered by turning the ankle left or right. No pain when moving her ankle up or down. She denies any numbness or tingling. She is able to bear weight and walk, though avoids movements that exacerbate the pain.   The following sections of the patient's history were reviewed and updated as appropriate: allergies, current medications, past family history, past medical history, past social history, past surgical history, and problem list.     Past Medical History:  Diagnosis Date   Anxiety    panic attacks   Asthma    CHF (congestive heart failure), NYHA class II, acute, systolic (HCC) 10/06/2018   Depression    Fibromyalgia    Hypertension    no meds currently   No pertinent past medical history    Pneumonia    hx of    Sinus problem     Patient Active Problem List   Diagnosis Date Noted   HFrEF (heart failure with reduced ejection fraction) (HCC) 12/29/2022   OSA on CPAP 12/29/2022    Morbid obesity (HCC) 12/29/2022   Mixed anxiety and depressive disorder 07/12/2022   Left hip pain 09/30/2020   Environmental and seasonal allergies 07/24/2019   Asthma 10/06/2018   Hypertensive urgency 10/06/2018   GERD (gastroesophageal reflux disease) 10/06/2018   Depression with anxiety 10/06/2018   CHF (congestive heart failure), NYHA class II, acute, systolic (HCC) 10/06/2018   Shortness of breath    Cardiomegaly    Snoring 10/04/2018   Essential hypertension 08/14/2018   Moderate persistent asthma with acute exacerbation 08/14/2018   Fibromyalgia 11/18/2016   Class 3 severe obesity due to excess calories without serious comorbidity with body mass index (BMI) of 45.0 to 49.9 in adult (HCC) 11/18/2016   Adjustment reaction with anxiety and depression 11/18/2016   Chronic bilateral low back pain without sciatica 09/22/2015   Myofascial pain 09/22/2015   Lapband port leaking-REVISED May 2013 09/10/2011   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 07/21/2011    Past Surgical History:  Procedure Laterality Date   ESOPHAGOGASTRODUODENOSCOPY N/A 11/26/2014   Procedure: ESOPHAGOGASTRODUODENOSCOPY (EGD);  Surgeon: Alm Angle, MD;  Location: [facility];  Service: General;  Laterality: N/A;   GASTRIC BANDING PORT REVISION  08/24/11   LAPAROSCOPIC GASTRIC BANDING  12/26/2007   LAPAROSCOPIC LYSIS OF ADHESIONS N/A 01/06/2015   Procedure: LAPAROSCOPIC LYSIS OF ADHESIONS;  Surgeon: Donnice Lunger, MD;  Location: [facility];  Service: General;  Laterality: N/A;   LAPAROSCOPIC REPAIR AND REMOVAL  OF GASTRIC BAND N/A 01/06/2015   Procedure: LAPAROSCOPIC RESIGHTING OF GASTRIC Band PORT;  Surgeon: Donnice Lunger, MD;  Location: [facility];  Service: General;  Laterality: N/A;   NO PAST SURGERIES      OB History   No obstetric history on file.      Home Medications    Prior to Admission medications  Medication Sig Start Date End Date Taking? Authorizing Provider  ibuprofen  (ADVIL ) 800 MG tablet Take 1 tablet  (800 mg total) by mouth every 8 (eight) hours as needed (pain). Take with food to avoid stomach upset. Do not take any additional NSAIDs while on this. You may take tylenol  in addition to this if needed for extra pain relief. 04/09/24  Yes Iola Lukes, FNP  albuterol  (PROVENTIL ) (2.5 MG/3ML) 0.083% nebulizer solution Take 3 mLs (2.5 mg total) by nebulization every 6 (six) hours as needed for wheezing or shortness of breath. 03/24/22   Georgina Speaks, FNP  albuterol  (VENTOLIN  HFA) 108 (90 Base) MCG/ACT inhaler Inhale 2 puffs into the lungs every 6 (six) hours as needed for wheezing or shortness of breath. 03/24/22   Moore, Janece, FNP  ALPRAZolam  (XANAX ) 1 MG tablet Take 1 tablet (1 mg total) by mouth 2 (two) times daily as needed for anxiety. 12/01/23   Mozingo, Regina Nattalie, NP  azelastine  (ASTELIN ) 0.1 % nasal spray Place 2 sprays into both nostrils 2 (two) times daily. 06/17/23   Jason Leita Repine, FNP  Blood Pressure Monitoring (OMRON 3 SERIES BP MONITOR) DEVI Use daily as directed. 09/20/23   Wyn Jackee VEAR Mickey., NP  buPROPion  (WELLBUTRIN  XL) 150 MG 24 hr tablet Take 1 tablet (150 mg total) by mouth daily. 02/01/24   Mozingo, Regina Nattalie, NP  buPROPion  (WELLBUTRIN  XL) 300 MG 24 hr tablet Take 1 tablet (300 mg total) by mouth daily. 02/01/24   Mozingo, Regina Nattalie, NP  carvedilol  (COREG ) 25 MG tablet Take 1 tablet (25 mg total) by mouth 2 (two) times daily with a meal. 01/04/24   Wyn Jackee VEAR Mickey., NP  DULoxetine  (CYMBALTA ) 30 MG capsule Take 1 capsule (30 mg total) by mouth daily. 12/01/23   Mozingo, Regina Nattalie, NP  DULoxetine  (CYMBALTA ) 60 MG capsule Take 1 capsule (60 mg total) by mouth daily. 12/01/23   Mozingo, Regina Nattalie, NP  empagliflozin  (JARDIANCE ) 10 MG TABS tablet Take 1 tablet (10 mg total) by mouth every morning before breakfast. 09/20/23   Wyn Jackee VEAR Mickey., NP  furosemide  (LASIX ) 40 MG tablet Take 1 tablet (40 mg total) by mouth daily. You may take 1 extra tablet (40  mg total) by mouth daily as needed for lower extremity swelling/weight gain. 09/20/23   Wyn Jackee VEAR Mickey., NP  glycopyrrolate  (ROBINUL ) 1 MG tablet Take 1-3 tablets (1-3 mg total) by mouth daily as directed. 01/11/24   Sandridge, Brenda K, PA-C  levocetirizine (XYZAL ) 5 MG tablet Take 1 tablet (5 mg total) by mouth every evening. 06/17/23   Jason Leita Repine, FNP  levonorgestrel (MIRENA, 52 MG,) 20 MCG/DAY IUD  03/11/16   [provider]  lisdexamfetamine  (VYVANSE ) 50 MG capsule Take 1 capsule (50 mg total) by mouth daily. 12/29/23   Mozingo, Regina Nattalie, NP  lisdexamfetamine  (VYVANSE ) 50 MG capsule Take 1 capsule (50 mg total) by mouth daily. 12/01/23   Mozingo, Regina Nattalie, NP  methocarbamol  (ROBAXIN ) 750 MG tablet Take 1 tablet (750 mg total) by mouth 2 (two) times daily as needed for muscle spasms. 11/11/23   Jerri Kay HERO,  MD  methylPREDNISolone  (MEDROL  DOSEPAK) 4 MG TBPK tablet Take as directed on pack for 6 days. Patient not taking: Reported on 04/09/2024 11/11/23   Jerri Kay HERO, MD  montelukast  (SINGULAIR ) 10 MG tablet TAKE 1 TABLET BY MOUTH EVERYDAY AT BEDTIME 06/17/23   Jason Leita Repine, FNP  omeprazole  (PRILOSEC) 40 MG capsule TAKE 1 CAPSULE BY MOUTH EVERY DAY 06/17/23   Jason Leita Repine, FNP  sacubitril -valsartan  (ENTRESTO ) 97-103 MG Take 1 tablet by mouth 2 (two) times daily. 09/20/23   Wyn Jackee VEAR Mickey., NP  semaglutide -weight management (WEGOVY ) 1 MG/0.5ML SOAJ SQ injection Inject 1 mg into the skin once a week. When you finish the 0.5mg  01/03/24   Shlomo Wilbert SAUNDERS, MD  spironolactone  (ALDACTONE ) 25 MG tablet Take 1 tablet (25 mg total) by mouth daily. 09/20/23   Wyn Jackee VEAR Mickey., NP  traZODone  (DESYREL ) 50 MG tablet Take 1 tablet (50 mg total) by mouth at bedtime. 12/01/23   Mozingo, Regina Nattalie, NP  mometasone -formoterol  (DULERA) 200-5 MCG/ACT AERO Inhale 2 puffs into the lungs 2 (two) times a day. 10/19/18 12/17/19  Jeneal Danita Macintosh, MD    Family  History Family History  Problem Relation Age of Onset   Ulcers Mother    Hypertension Mother    Cancer Maternal Uncle        lung   Cancer Maternal Grandfather        lung and back    Social History Social History[1]   Allergies   Grass extracts [gramineae pollens] and Shellfish allergy   Review of Systems Review of Systems  Musculoskeletal:  Positive for arthralgias and gait problem.  All other systems reviewed and are negative.    Physical Exam Triage Vital Signs ED Triage Vitals  Encounter Vitals Group     BP 04/09/24 1550 (!) 159/111     Girls Systolic BP Percentile --      Girls Diastolic BP Percentile --      Boys Systolic BP Percentile --      Boys Diastolic BP Percentile --      Pulse Rate 04/09/24 1550 89     Resp 04/09/24 1550 16     Temp 04/09/24 1550 98.2 F (36.8 C)     Temp Source 04/09/24 1550 Oral     SpO2 04/09/24 1550 93 %     Weight --      Height --      Head Circumference --      Peak Flow --      Pain Score 04/09/24 1556 7     Pain Loc --      Pain Education --      Exclude from Growth Chart --    No data found.  Updated Vital Signs BP (!) 159/111 (BP Location: Right Arm)   Pulse 80   Temp 98.2 F (36.8 C) (Oral)   Resp 16   SpO2 96%   Visual Acuity Right Eye Distance:   Left Eye Distance:   Bilateral Distance:    Right Eye Near:   Left Eye Near:    Bilateral Near:     Physical Exam Vitals reviewed.  Constitutional:      General: She is awake. She is not in acute distress.    Appearance: Normal appearance. She is well-developed. She is not ill-appearing, toxic-appearing or diaphoretic.  HENT:     Head: Normocephalic.     Right Ear: Hearing normal.     Left Ear: Hearing normal.  Nose: Nose normal.     Mouth/Throat:     Mouth: Mucous membranes are moist.  Eyes:     General: Vision grossly intact.     Conjunctiva/sclera: Conjunctivae normal.  Cardiovascular:     Rate and Rhythm: Normal rate and regular rhythm.      Heart sounds: Normal heart sounds.  Pulmonary:     Effort: Pulmonary effort is normal.     Breath sounds: Normal breath sounds and air entry.  Musculoskeletal:        General: Normal range of motion.     Cervical back: Normal range of motion and neck supple.     Right ankle: No swelling, deformity, ecchymosis or lacerations. Tenderness present over the lateral malleolus. Normal range of motion.     Right foot: Normal.     Comments: Tenderness noted along the lateral aspect of the right ankle with inversion and eversion movements only. No pain is elicited with dorsiflexion or plantarflexion. There is no swelling or obvious deformity. Pedal pulses are intact, and the ankle and foot demonstrate full strength, sensation, and range of motion without deficits.  Skin:    General: Skin is warm and dry.  Neurological:     General: No focal deficit present.     Mental Status: She is alert and oriented to person, place, and time.     Sensory: Sensation is intact. No sensory deficit.     Motor: Motor function is intact. No weakness.     Gait: Gait is intact.  Psychiatric:        Speech: Speech normal.        Behavior: Behavior is cooperative.      UC Treatments / Results  Labs (all labs ordered are listed, but only abnormal results are displayed) Labs Reviewed - No data to display  EKG   Radiology No results found.  Procedures Procedures (including critical care time)  Medications Ordered in UC Medications - No data to display  Initial Impression / Assessment and Plan / UC Course  I have reviewed the triage vital signs and the nursing notes.  Pertinent labs & imaging results that were available during my care of the patient were reviewed by me and considered in my medical decision making (see chart for details).     The patient presents with right ankle pain after injury. Examination revealed tenderness without swelling, focal deficits or neurovascular compromise. X-ray has  been ordered and results are pending. Patient will be notified if imaging reveals any abnormal findings; otherwise, results may be reviewed through MyChart. Findings are most consistent with a soft tissue injury. Supportive management with rest, ice, compression, and elevation was advised. Patient placed in ankle immobilizer. Motrin  was prescribed for pain and inflammation. While taking the medication, patient advised to not use over-the-counter anti-inflammatories. If needed, patient may take Tylenol  for additional pain relief. The patient was instructed to follow up with orthopedics if symptoms do not improve within 10 days or sooner if pain or swelling worsens, or if new concerning symptoms develop.   Today's evaluation has revealed no signs of a dangerous process. Discussed diagnosis with patient and/or guardian. Patient and/or guardian aware of their diagnosis, possible red flag symptoms to watch out for and need for close follow up. Patient and/or guardian understands verbal and written discharge instructions. Patient and/or guardian comfortable with plan and disposition.  Patient and/or guardian has a clear mental status at this time, good insight into illness (after discussion and teaching) and has clear  judgment to make decisions regarding their care  Documentation was completed with the aid of voice recognition software. Transcription may contain typographical errors.   Final Clinical Impressions(s) / UC Diagnoses   Final diagnoses:  Ankle injury, right, initial encounter     Discharge Instructions      You were seen today for right ankle pain. Your x-ray did not show a fracture or dislocation based on my interpretation; however, the official radiologist read is still pending at this time. You will only be notified if your XR shows anything abnormal; otherwise, you may view your results on your MyChart.   As of now, your exam is most consistent with a soft tissue injury. To help your  ankle heal, rest as much as possible and avoid activities that make the pain worse. Apply ice packs for 15-20 minutes at a time several times a day and keep the ankle elevated on pillows when resting. You were also placed in an ankle immobilizer to help with pain and swelling. Use this for comfort. You may remove to shower and sleep. You were prescribed ibuprofen  (motrin ) for pain and swelling. While taking this medication, do not use over-the-counter anti-inflammatories such as aspirin, naproxen or Aleve, as this may increase the risk of side effects. If needed, you may take Tylenol  (acetaminophen ) 1000 mg every six hours for additional pain relief. This equals two 500 mg tablets at a time. Be careful not to take more than 4000 mg of Tylenol  in a 24-hour period. Most injuries of this type improve gradually over the course of a week or so. Please follow up with an orthopedic specialist if your pain or swelling does not improve within 10 days, or sooner if you have worsening symptoms, difficulty walking, or new concerns. Go to the emergency department right away if you develop sudden severe pain, inability to bear weight, rapidly increasing swelling, numbness, tingling, or color changes in your foot or toes.     ED Prescriptions     Medication Sig Dispense Auth. Provider   ibuprofen  (ADVIL ) 800 MG tablet Take 1 tablet (800 mg total) by mouth every 8 (eight) hours as needed (pain). Take with food to avoid stomach upset. Do not take any additional NSAIDs while on this. You may take tylenol  in addition to this if needed for extra pain relief. 21 tablet Iola Lukes, FNP      PDMP not reviewed this encounter.      [1]  Social History Tobacco Use   Smoking status: Never   Smokeless tobacco: Never  Vaping Use   Vaping status: Never Used  Substance Use Topics   Alcohol use: Never   Drug use: Never     Iola Lukes, FNP 04/09/24 1826  "

## 2024-04-09 NOTE — Discharge Instructions (Addendum)
 You were seen today for right ankle pain. Your x-ray did not show a fracture or dislocation based on my interpretation; however, the official radiologist read is still pending at this time. You will only be notified if your XR shows anything abnormal; otherwise, you may view your results on your MyChart.   As of now, your exam is most consistent with a soft tissue injury. To help your ankle heal, rest as much as possible and avoid activities that make the pain worse. Apply ice packs for 15-20 minutes at a time several times a day and keep the ankle elevated on pillows when resting. You were also placed in an ankle immobilizer to help with pain and swelling. Use this for comfort. You may remove to shower and sleep. You were prescribed ibuprofen  (motrin ) for pain and swelling. While taking this medication, do not use over-the-counter anti-inflammatories such as aspirin, naproxen or Aleve, as this may increase the risk of side effects. If needed, you may take Tylenol  (acetaminophen ) 1000 mg every six hours for additional pain relief. This equals two 500 mg tablets at a time. Be careful not to take more than 4000 mg of Tylenol  in a 24-hour period. Most injuries of this type improve gradually over the course of a week or so. Please follow up with an orthopedic specialist if your pain or swelling does not improve within 10 days, or sooner if you have worsening symptoms, difficulty walking, or new concerns. Go to the emergency department right away if you develop sudden severe pain, inability to bear weight, rapidly increasing swelling, numbness, tingling, or color changes in your foot or toes.

## 2024-04-09 NOTE — Telephone Encounter (Signed)
 Changed pharmacy to [facility] in [place] per patient

## 2024-04-09 NOTE — ED Triage Notes (Signed)
 Pt states yesterday she was carrying boxes and stepped off the curb the wrong way. Presents today with right ankle pain especially with ankle rotation. States she doesn't have pain when ankle is not rotated.   She has tried ice, rest, and ace wrap.

## 2024-04-16 ENCOUNTER — Encounter: Payer: Self-pay | Admitting: Pharmacist

## 2024-04-16 ENCOUNTER — Ambulatory Visit: Admitting: Clinical

## 2024-04-16 DIAGNOSIS — F341 Dysthymic disorder: Secondary | ICD-10-CM

## 2024-04-16 DIAGNOSIS — F4310 Post-traumatic stress disorder, unspecified: Secondary | ICD-10-CM | POA: Diagnosis not present
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
## 2024-04-18 ENCOUNTER — Other Ambulatory Visit (HOSPITAL_COMMUNITY): Payer: Self-pay

## 2024-04-18 ENCOUNTER — Encounter: Payer: Self-pay | Admitting: Clinical

## 2024-04-18 ENCOUNTER — Telehealth: Payer: Self-pay | Admitting: Pharmacy Technician

## 2024-04-18 ENCOUNTER — Encounter (HOSPITAL_COMMUNITY): Payer: Self-pay

## 2024-04-18 ENCOUNTER — Other Ambulatory Visit (HOSPITAL_BASED_OUTPATIENT_CLINIC_OR_DEPARTMENT_OTHER): Payer: Self-pay

## 2024-04-18 NOTE — Telephone Encounter (Signed)
 Pharmacy Patient Advocate Encounter   Received notification from Patient Advice Request messages that prior authorization for wegovy  is required/requested.   Insurance verification completed.   The patient is insured through [facility] MEDICAID.   Per test claim: PA required; PA submitted to above mentioned insurance via Latent Key/confirmation #/EOC [id number] Status is pending

## 2024-04-18 NOTE — Telephone Encounter (Signed)
 Pharmacy Patient Advocate Encounter  Received notification from [employer] that Prior Authorization for wegovy  has been APPROVED from 04/18/24 to 10/15/24   PA #/Case ID/Reference #: [id number]

## 2024-04-23 ENCOUNTER — Ambulatory Visit: Admitting: Clinical

## 2024-04-23 DIAGNOSIS — F341 Dysthymic disorder: Secondary | ICD-10-CM

## 2024-04-23 DIAGNOSIS — F411 Generalized anxiety disorder: Secondary | ICD-10-CM | POA: Diagnosis not present

## 2024-04-23 DIAGNOSIS — F4310 Post-traumatic stress disorder, unspecified: Secondary | ICD-10-CM

## 2024-04-23 NOTE — Progress Notes (Unsigned)
 [facility] Counselor Progress Note - TELEMEDICINE VISIT  Patient ID: Samantha Romero, MRN: [id number]    Date: 04/23/24 2pm- Video link sent to pt's number  Time Spent: 2:04pm  - 3pm  : 56 Minutes  Types of Service: Individual psychotherapy and Video visit  Client and/or Legal Guardian location: Client's home in [facility] Therapist location: Remote Work- [place], [place] All persons participating in visit: Client & this therapist  I connected with client and/or legal guardian via Video Enabled Telemedicine Application  (Video is Caregility application) and verified that I am speaking with the correct person using two identifiers. Discussed confidentiality: Yes   I discussed the limitations of telemedicine and the availability of in person appointments.  Discussed there is a possibility of technology failure and discussed alternative modes of communication if that failure occurs.  I discussed that engaging in this telemedicine visit, they consent to the provision of behavioral healthcare and the services will be billed under their insurance.  Client and/or legal guardian expressed understanding and consented to Telemedicine visit: Yes    Presenting Concerns:  - Increased anxiety with inclement weather preparation  Mental Status Exam: Appearance:  {PSY:22683}     Behavior: {PSY:21022743}  Motor: {PSY:22302}  Speech/Language:  {PSY:22685}  Affect: {PSY:22687}  Mood: {PSY:31886}  Thought process: {PSY:31888}  Thought content:   {PSY:339-523-9266}  Sensory/Perceptual disturbances:   {PSY:[phone]}  Orientation: {PSY:30297}  Attention: {PSY:22877}  Concentration: {PSY:629-395-4902}  Memory: {PSY:([phone]}  Fund of knowledge:  {PSY:629-395-4902}  Insight:   {PSY:629-395-4902}  Judgment:  {PSY:629-395-4902}  Impulse Control: {PSY:629-395-4902}   Risk Assessment: Danger to Self:  {PSY:22692} Self-injurious Behavior: {PSY:22692} Danger to Others: {PSY:22692} Duty to  Warn:{PSY:311194}   Subjective:  ***  Felt frozen in fear of inclement weather  Interventions: {PSY:520-330-6315}  Client Response: ***  Increased nightmare-feltl panciked due to weather Dreams of getting things done, eg llaundry, hair done  Sleep - Up and down, last night was a good night of sleep after the endof the inclement weather  Tried strategies to decrease anxiety attacks - essential oil -roller ball  Diagnosis:  GAD (generalized anxiety disorder)  Persistent depressive disorder  Post-traumatic stress disorder, unspecified   Goals, Assessment & Plan:    Frequency: Weekly  Modality: In-Person or Telemedicine    Goal:  Implement assertive communication by utilizing cognitive coping strategies to increase her self-confidence as evidenced by self-report    Target Date: 08/27/2024  Progress: Ongoing      Goals:   Develop healthy thinking and beliefs about self, others, and the world to alleviate depressive symptoms.   Objectives:    Identify and replace thoughts and beliefs that support depression.  - Samantha Romero acknowledged ongoing thoughts that make her feel insecure and anxious.    Increasingly verbalize hopeful and positive statements regarding self, others, and the future.    Improve emotional regulation by utilizing coping skills to manage distress during sessions and at home, breathing, mindfulness, etc.   - Samantha Romero was able to identify various strategies that she implemented this past week to decrease her anxiety & improve her mood.   Enhance Sleep Quality by implementing a sleep hygiene routine and changing bedroom environments as evidenced by consistent bedtime in the next 4-6 weeks. *** - Samantha Romero continues to struggle with her sleep at night.     Target Date: 08/27/2024  Progress: Ongoing    ***  Treatment Level {Frequency of sessions.:26745}  Modality - TeleMedicine - Video  Goal:  ***  Target Date: ***  Progress: ***      Goals: ***  Target Date: ***  Progress: ***    Samantha Romero P. Trudy, MSW, LCSW [employer] Therapist Main Office: [phone]

## 2024-04-25 ENCOUNTER — Ambulatory Visit: Admitting: Physician Assistant

## 2024-04-27 ENCOUNTER — Encounter: Payer: Self-pay | Admitting: Clinical

## 2024-04-28 NOTE — Progress Notes (Unsigned)
 [facility] Counselor Progress Note - TELEMEDICINE VISIT  Patient ID: Samantha Romero, MRN: [id number]    Date: 04/30/24  Time Spent: 2pm   - 2:59pm  : 59 Minutes  Types of Service: Individual psychotherapy and Video visit  Client and/or Legal Guardian location: Client's home, [place] Therapist location: Working remote- [place], [place] All persons participating in visit: Client & this therapist  I connected with client and/or legal guardian via Video Enabled Telemedicine Application  (Video is Caregility application) and verified that I am speaking with the correct person using two identifiers. Discussed confidentiality: Yes   I discussed the limitations of telemedicine and the availability of in person appointments.  Discussed there is a possibility of technology failure and discussed alternative modes of communication if that failure occurs.  I discussed that engaging in this telemedicine visit, they consent to the provision of behavioral healthcare and the services will be billed under their insurance.  Client and/or legal guardian expressed understanding and consented to Telemedicine visit: Yes    Presenting Concerns: ***  Mental Status Exam: Appearance:  {PSY:22683}     Behavior: {PSY:21022743}  Motor: {PSY:22302}  Speech/Language:  {PSY:22685}  Affect: {PSY:22687}  Mood: {PSY:31886}  Thought process: {PSY:31888}  Thought content:   {[phone]}  Sensory/Perceptual disturbances:   {PSY:701-842-0393}  Orientation: {PSY:30297}  Attention: {PSY:22877}  Concentration: {PSY:([phone] 440-1870}  Memory: {PSY:640-420-8470}  Fund of knowledge:  {PSY:([phone] 440-1870}  Insight:   {PSY:(239) 440-1870}  Judgment:  {PSY:(239) 440-1870}  Impulse Control: {PSY:(239) 440-1870}   Risk Assessment: Danger to Self:  {PSY:22692} Self-injurious Behavior: {PSY:22692} Danger to Others: {PSY:22692} Duty to Warn:{PSY:311194}   Subjective:  ***   Interventions: {PSY:207 524 9687}  Client  Response: ***   Diagnosis:  GAD (generalized anxiety disorder)  Persistent depressive disorder  Post-traumatic stress disorder, unspecified   Goals, Assessment & Plan:      Frequency: Weekly  Modality: In-Person or Telemedicine    Goal:  Implement assertive communication by utilizing cognitive coping strategies to increase her self-confidence as evidenced by self-report    Target Date: 08/27/2024  Progress: Ongoing      Goals:   Develop healthy thinking and beliefs about self, others, and the world to alleviate depressive symptoms.   Objectives:    Identify and replace thoughts and beliefs that support depression.  ***  Increasingly verbalize hopeful and positive statements regarding self, others, and the future.  *** Looking at her situation in a different way, instead of what she doesn't have, assessing what she has and what she can do with her resources   Improve emotional regulation by utilizing coping skills to manage distress during sessions and at home, breathing, mindfulness, etc.   ***   Enhance Sleep Quality by implementing a sleep hygiene routine and changing bedroom environments as evidenced by consistent bedtime in the next 4-6 weeks.   *** Not sleeping well - schedule, sleeping later  Husna reported being more intentional in preparing more nutritious meeals   Target Date: 08/27/2024  Progress: Ongoing       ***  Plan: Psycho education on Somatic Resourcing - Information on calming & soothing (breath work, muscle relaxation strategies, high power poses)  Keighley Deckman P. Trudy, MSW, LCSW [employer] Therapist Main Office: [phone]

## 2024-04-30 ENCOUNTER — Ambulatory Visit: Admitting: Clinical

## 2024-04-30 DIAGNOSIS — F4310 Post-traumatic stress disorder, unspecified: Secondary | ICD-10-CM | POA: Diagnosis not present

## 2024-04-30 DIAGNOSIS — F411 Generalized anxiety disorder: Secondary | ICD-10-CM | POA: Diagnosis not present

## 2024-04-30 DIAGNOSIS — F341 Dysthymic disorder: Secondary | ICD-10-CM | POA: Diagnosis not present

## 2024-05-03 ENCOUNTER — Other Ambulatory Visit (HOSPITAL_BASED_OUTPATIENT_CLINIC_OR_DEPARTMENT_OTHER): Payer: Self-pay

## 2024-05-04 ENCOUNTER — Other Ambulatory Visit (HOSPITAL_BASED_OUTPATIENT_CLINIC_OR_DEPARTMENT_OTHER): Payer: Self-pay

## 2024-05-07 ENCOUNTER — Ambulatory Visit: Admitting: Clinical

## 2024-05-14 ENCOUNTER — Ambulatory Visit: Admitting: Clinical

## 2024-05-21 ENCOUNTER — Ambulatory Visit: Admitting: Clinical

## 2024-05-28 ENCOUNTER — Ambulatory Visit: Admitting: Clinical

## 2024-06-04 ENCOUNTER — Ambulatory Visit: Admitting: Clinical

## 2024-06-11 ENCOUNTER — Ambulatory Visit: Admitting: Clinical

## 2024-06-18 ENCOUNTER — Ambulatory Visit: Admitting: Clinical
